# Patient Record
Sex: Female | Born: 1970 | Race: Black or African American | Hispanic: No | Marital: Single | Smoking: Never smoker
Health system: Southern US, Community
[De-identification: ages and names within clinical notes are randomized; demographics above are authoritative.]

## PROBLEM LIST (undated history)

## (undated) DIAGNOSIS — K219 Gastro-esophageal reflux disease without esophagitis: Secondary | ICD-10-CM

## (undated) DIAGNOSIS — I341 Nonrheumatic mitral (valve) prolapse: Secondary | ICD-10-CM

## (undated) DIAGNOSIS — G4730 Sleep apnea, unspecified: Secondary | ICD-10-CM

## (undated) DIAGNOSIS — E538 Deficiency of other specified B group vitamins: Secondary | ICD-10-CM

## (undated) DIAGNOSIS — L309 Dermatitis, unspecified: Secondary | ICD-10-CM

## (undated) DIAGNOSIS — I493 Ventricular premature depolarization: Secondary | ICD-10-CM

## (undated) DIAGNOSIS — B009 Herpesviral infection, unspecified: Secondary | ICD-10-CM

## (undated) DIAGNOSIS — D649 Anemia, unspecified: Secondary | ICD-10-CM

## (undated) DIAGNOSIS — M1990 Unspecified osteoarthritis, unspecified site: Secondary | ICD-10-CM

## (undated) DIAGNOSIS — R2689 Other abnormalities of gait and mobility: Secondary | ICD-10-CM

## (undated) DIAGNOSIS — I10 Essential (primary) hypertension: Secondary | ICD-10-CM

## (undated) DIAGNOSIS — N950 Postmenopausal bleeding: Secondary | ICD-10-CM

## (undated) DIAGNOSIS — R7303 Prediabetes: Secondary | ICD-10-CM

## (undated) DIAGNOSIS — Z973 Presence of spectacles and contact lenses: Secondary | ICD-10-CM

## (undated) DIAGNOSIS — N840 Polyp of corpus uteri: Secondary | ICD-10-CM

## (undated) HISTORY — DX: Other abnormalities of gait and mobility: R26.89

## (undated) HISTORY — DX: Herpesviral infection, unspecified: B00.9

## (undated) HISTORY — PX: OTHER SURGICAL HISTORY: SHX169

## (undated) HISTORY — DX: Sleep apnea, unspecified: G47.30

## (undated) HISTORY — DX: Nonrheumatic mitral (valve) prolapse: I34.1

## (undated) HISTORY — DX: Dermatitis, unspecified: L30.9

## (undated) HISTORY — DX: Gastro-esophageal reflux disease without esophagitis: K21.9

## (undated) HISTORY — DX: Ventricular premature depolarization: I49.3

## (undated) HISTORY — DX: Anemia, unspecified: D64.9

## (undated) HISTORY — PX: NO PAST SURGERIES: SHX2092

---

## 2001-08-29 ENCOUNTER — Emergency Department (HOSPITAL_COMMUNITY): Admission: EM | Admit: 2001-08-29 | Discharge: 2001-08-29 | Payer: Self-pay | Admitting: Emergency Medicine

## 2001-08-29 ENCOUNTER — Encounter: Payer: Self-pay | Admitting: *Deleted

## 2001-08-29 ENCOUNTER — Ambulatory Visit (HOSPITAL_COMMUNITY): Admission: RE | Admit: 2001-08-29 | Discharge: 2001-08-29 | Payer: Self-pay | Admitting: *Deleted

## 2001-11-08 ENCOUNTER — Ambulatory Visit (HOSPITAL_COMMUNITY): Admission: RE | Admit: 2001-11-08 | Discharge: 2001-11-08 | Payer: Self-pay | Admitting: Family Medicine

## 2003-08-01 ENCOUNTER — Emergency Department (HOSPITAL_COMMUNITY): Admission: EM | Admit: 2003-08-01 | Discharge: 2003-08-02 | Payer: Self-pay | Admitting: Emergency Medicine

## 2003-08-02 ENCOUNTER — Encounter: Payer: Self-pay | Admitting: Emergency Medicine

## 2007-05-02 ENCOUNTER — Other Ambulatory Visit: Admission: RE | Admit: 2007-05-02 | Discharge: 2007-05-02 | Payer: Self-pay | Admitting: Obstetrics and Gynecology

## 2008-05-05 ENCOUNTER — Other Ambulatory Visit: Admission: RE | Admit: 2008-05-05 | Discharge: 2008-05-05 | Payer: Self-pay | Admitting: Obstetrics and Gynecology

## 2013-06-04 ENCOUNTER — Encounter: Payer: Self-pay | Admitting: Certified Nurse Midwife

## 2013-06-05 ENCOUNTER — Encounter: Payer: Self-pay | Admitting: Certified Nurse Midwife

## 2013-06-05 ENCOUNTER — Ambulatory Visit: Payer: Self-pay | Admitting: Certified Nurse Midwife

## 2013-06-05 ENCOUNTER — Ambulatory Visit (INDEPENDENT_AMBULATORY_CARE_PROVIDER_SITE_OTHER): Payer: PRIVATE HEALTH INSURANCE | Admitting: Certified Nurse Midwife

## 2013-06-05 VITALS — BP 118/72 | HR 64 | Resp 16 | Ht 65.25 in | Wt 236.0 lb

## 2013-06-05 DIAGNOSIS — Z309 Encounter for contraceptive management, unspecified: Secondary | ICD-10-CM

## 2013-06-05 DIAGNOSIS — Z0000 Encounter for general adult medical examination without abnormal findings: Secondary | ICD-10-CM

## 2013-06-05 DIAGNOSIS — Z23 Encounter for immunization: Secondary | ICD-10-CM

## 2013-06-05 DIAGNOSIS — IMO0001 Reserved for inherently not codable concepts without codable children: Secondary | ICD-10-CM

## 2013-06-05 DIAGNOSIS — Z01419 Encounter for gynecological examination (general) (routine) without abnormal findings: Secondary | ICD-10-CM

## 2013-06-05 LAB — POCT URINALYSIS DIPSTICK
Bilirubin, UA: NEGATIVE
Glucose, UA: NEGATIVE
Ketones, UA: NEGATIVE
Leukocytes, UA: NEGATIVE
Nitrite, UA: NEGATIVE
Protein, UA: NEGATIVE
Urobilinogen, UA: NEGATIVE
pH, UA: 5

## 2013-06-05 LAB — CBC
HCT: 38.9 % (ref 36.0–46.0)
Hemoglobin: 13.1 g/dL (ref 12.0–15.0)
MCH: 29.8 pg (ref 26.0–34.0)
MCHC: 33.7 g/dL (ref 30.0–36.0)
MCV: 88.4 fL (ref 78.0–100.0)
Platelets: 347 10*3/uL (ref 150–400)
RBC: 4.4 MIL/uL (ref 3.87–5.11)
RDW: 14 % (ref 11.5–15.5)
WBC: 5.5 10*3/uL (ref 4.0–10.5)

## 2013-06-05 LAB — TSH: TSH: 1.316 u[IU]/mL (ref 0.350–4.500)

## 2013-06-05 MED ORDER — LEVONORGESTREL-ETHINYL ESTRAD 0.1-20 MG-MCG PO TABS: 1.0000 | ORAL_TABLET | Freq: Every day | ORAL | Status: DC

## 2013-06-05 NOTE — Progress Notes (Signed)
42 y.o. G0P0000 Single African American Fe here for annual exam. Periods normal without problems. OCP working well for cycle control.  Desires continuance. Not sexually active, no STD screening needed. No health issues. Fasted for labs today. Still caring for mother(on dialysis) and sister who has health problems. Sees PCP prn  Patient's last menstrual period was 06/04/2013.          Sexually active: no  The current method of family planning is OCP (estrogen/progesterone).    Exercising: yes  walking Smoker:  no  Health Maintenance: Pap:  06-04-12 neg HPV HR neg MMG:  09-18-12 normal Colonoscopy:  none BMD:   none TDaP:  2003 Labs: Poct urine-rbc3+ on menses Self breast exam: done monthly   reports that she has never smoked. She does not have any smokeless tobacco history on file. She reports that she does not drink alcohol or use illicit drugs.  Past Medical History  Diagnosis Date  . GERD (gastroesophageal reflux disease)   . Anemia   . MVP (mitral valve prolapse)     History reviewed. No pertinent past surgical history.  Current Outpatient Prescriptions  Medication Sig Dispense Refill  . Cholecalciferol (VITAMIN D PO) Take 50,000 Int'l Units by mouth once a week.       Marland Kitchen levonorgestrel-ethinyl estradiol (AVIANE,ALESSE,LESSINA) 0.1-20 MG-MCG tablet Take 1 tablet by mouth daily.      . Multiple Vitamins-Minerals (MULTIVITAMIN PO) Take by mouth daily.       . Omega-3 Fatty Acids (FISH OIL PO) Take 1,000 mg by mouth. 2 daily      . Pantoprazole Sodium (PROTONIX PO) Take 20 mg by mouth daily.        No current facility-administered medications for this visit.    Family History  Problem Relation Age of Onset  . Hypertension Mother   . Diabetes Mother   . Heart disease Father   . Hypertension Father   . Hypertension Sister     ROS:  Pertinent items are noted in HPI.  Otherwise, a comprehensive ROS was negative.  Exam:   BP 118/72  Pulse 64  Resp 16  Ht 5' 5.25"  (1.657 m)  Wt 236 lb (107.049 kg)  BMI 38.99 kg/m2  LMP 06/04/2013 Height: 5' 5.25" (165.7 cm)  Ht Readings from Last 3 Encounters:  06/05/13 5' 5.25" (1.657 m)    General appearance: alert, cooperative and appears stated age Head: Normocephalic, without obvious abnormality, atraumatic Neck: no adenopathy, supple, symmetrical, trachea midline and thyroid normal to inspection and palpation Lungs: clear to auscultation bilaterally Breasts: normal appearance, no masses or tenderness, No nipple retraction or dimpling, No nipple discharge or bleeding, No axillary or supraclavicular adenopathy Heart: regular rate and rhythm Abdomen: soft, non-tender; no masses,  no organomegaly Extremities: extremities normal, atraumatic, no cyanosis or edema Skin: Skin color, texture, turgor normal. No rashes or lesions Lymph nodes: Cervical, supraclavicular, and axillary nodes normal. No abnormal inguinal nodes palpated Neurologic: Grossly normal   Pelvic: External genitalia:  no lesions              Urethra:  normal appearing urethra with no masses, tenderness or lesions              Bartholin's and Skene's: normal                 Vagina: normal appearing vagina with normal color and discharge, no lesions, blood present              Cervix: normal,  non tender blood noted from cervix              Pap taken: no Bimanual Exam:  Uterus:  normal size, contour, position, consistency, mobility, non-tender and mid position              Adnexa: normal adnexa and no mass, fullness, tenderness               Rectovaginal: Confirms               Anus:  normal sphincter tone, no lesions  A:  Well Woman with normal exam  Contraception cycle control only, history of menorrhagia OCP working well  Immunization update  Social stress with family care  P:   Reviewed health and wellness pertinent to exam  Rx Aviane see order  Discussed risks and benefits of TDAP, requests  Encouraged to take time for self, seek  family and friends support  Pap smear as per guidelines  Mammogram Yearly pap smear not taken today  counseled on breast self exam, mammography screening, adequate intake of calcium and vitamin D, diet and exercise and weight control. Encouraged to work on diet and decrease fast food.  return annually or prn  An After Visit Summary was printed and given to the patient.

## 2013-06-05 NOTE — Progress Notes (Signed)
Note reviewed, agree with plan.  Aspyn Warnke, MD  

## 2013-06-05 NOTE — Patient Instructions (Addendum)

## 2013-06-06 ENCOUNTER — Telehealth: Payer: Self-pay

## 2013-06-06 LAB — LIPID PANEL
Cholesterol: 228 mg/dL — ABNORMAL HIGH (ref 0–200)
HDL: 73 mg/dL (ref >39)
LDL Cholesterol: 145 mg/dL — ABNORMAL HIGH (ref 0–99)
Total CHOL/HDL Ratio: 3.1 Ratio
Triglycerides: 52 mg/dL (ref <150)
VLDL: 10 mg/dL (ref 0–40)

## 2013-06-06 LAB — VITAMIN D 25 HYDROXY (VIT D DEFICIENCY, FRACTURES): Vit D, 25-Hydroxy: 35 ng/mL (ref 30–89)

## 2013-06-06 LAB — HEMOGLOBIN A1C
Hgb A1c MFr Bld: 5.4 % (ref <5.7)
Mean Plasma Glucose: 108 mg/dL (ref <117)

## 2013-06-06 NOTE — Telephone Encounter (Signed)
Message copied by Eliezer Bottom on Fri Jun 06, 2013  3:21 PM ------      Message from: Verner Chol      Created: Fri Jun 06, 2013 12:56 PM       Notify CBC, TSH,Hgb A 1-c, and Vitamin normal      Lipid panel borderline high Cholesterol, but HDL(good cholesterol) great, LDL cholesterol also borderline,      Work on decrease cholesterol foods, exercise and maintain  Normal weight, add fish to diet to help      Recheck at [facility]. ------

## 2013-06-06 NOTE — Telephone Encounter (Signed)
[email or web address]

## 2013-06-06 NOTE — Telephone Encounter (Signed)
Patient notified of results.

## 2013-06-30 ENCOUNTER — Other Ambulatory Visit: Payer: Self-pay | Admitting: Nurse Practitioner

## 2013-10-06 ENCOUNTER — Ambulatory Visit (INDEPENDENT_AMBULATORY_CARE_PROVIDER_SITE_OTHER): Payer: PRIVATE HEALTH INSURANCE | Admitting: Family Medicine

## 2013-10-06 ENCOUNTER — Encounter (INDEPENDENT_AMBULATORY_CARE_PROVIDER_SITE_OTHER): Payer: Self-pay

## 2013-10-06 ENCOUNTER — Encounter: Payer: Self-pay | Admitting: *Deleted

## 2013-10-06 VITALS — BP 125/73 | HR 85 | Temp 99.7°F | Ht 65.25 in | Wt 244.0 lb

## 2013-10-06 DIAGNOSIS — J069 Acute upper respiratory infection, unspecified: Secondary | ICD-10-CM

## 2013-10-06 DIAGNOSIS — J309 Allergic rhinitis, unspecified: Secondary | ICD-10-CM

## 2013-10-06 MED ORDER — AZITHROMYCIN 250 MG PO TABS: ORAL_TABLET | ORAL | Status: DC

## 2013-10-06 MED ORDER — FLUTICASONE PROPIONATE 50 MCG/ACT NA SUSP: 2.0000 | Freq: Every day | NASAL | Status: DC

## 2013-10-06 NOTE — Progress Notes (Signed)
   Subjective:    Patient ID: Renee Thomas, female    DOB: 06-28-1971, 42 y.o.   MRN: [id number]  HPI URI Symptoms Onset: 2-3 days  Description: rhinorrhea, nasal congestion, cough, some sneezing  Modifying factors:  Had similar sxs 2-3 weeks ago that resolved   Symptoms Nasal discharge: yes Fever: no Sore throat: yes Cough: es Wheezing: no Ear pain: no GI symptoms: no Sick contacts: no  Red Flags  Stiff neck: no Dyspnea: n Rash: no Swallowing difficulty: no  Sinusitis Risk Factors Headache/face pain: no Double sickening: no tooth pain: no  Allergy Risk Factors Sneezing: yes Itchy scratchy throat: mild Seasonal symptoms: yes  Flu Risk Factors Headache: no muscle aches: no severe fatigue: no     Review of Systems  All other systems reviewed and are negative.       Objective:   Physical Exam  Constitutional: She is oriented to person, place, and time. She appears well-developed and well-nourished.  HENT:  Head: Normocephalic and atraumatic.  Right Ear: External ear normal.  Left Ear: External ear normal.  +nasal erythema, rhinorrhea bilaterally, + post oropharyngeal erythema    Eyes: Conjunctivae are normal. Pupils are equal, round, and reactive to light.  Neck: Normal range of motion. Neck supple.  Cardiovascular: Normal rate and regular rhythm.   Pulmonary/Chest: Effort normal and breath sounds normal.  Abdominal: Soft. Bowel sounds are normal.  Musculoskeletal: Normal range of motion.  Neurological: She is alert and oriented to person, place, and time.  Skin: Skin is warm.          Assessment & Plan:  URI (upper respiratory infection) - Plan: azithromycin (ZITHROMAX) 250 MG tablet  Allergic rhinitis - Plan: fluticasone (FLONASE) 50 MCG/ACT nasal spray  Suspect overlapping viral and allergic etiologies of sxs  Zpak for atypical coverage given duration of sxs  Continue oral antihistamine  Start on flonase Discussed supportive care and  infectious/ENT red flags Follow up as needed.

## 2014-03-18 DIAGNOSIS — Z0289 Encounter for other administrative examinations: Secondary | ICD-10-CM

## 2014-03-23 ENCOUNTER — Telehealth: Payer: Self-pay | Admitting: Nurse Practitioner

## 2014-03-25 NOTE — Telephone Encounter (Signed)
Renee Thomas completed forms and faxed, pt aware

## 2014-06-12 ENCOUNTER — Ambulatory Visit: Payer: PRIVATE HEALTH INSURANCE | Admitting: Certified Nurse Midwife

## 2014-06-29 ENCOUNTER — Other Ambulatory Visit: Payer: Self-pay | Admitting: Certified Nurse Midwife

## 2014-06-29 NOTE — Telephone Encounter (Signed)
Last AEX: 06/05/13 Last refill:06/05/13 #1, 12 ref Current AEX:07/01/14  Sent 1 refill until pt AEX

## 2014-07-01 ENCOUNTER — Encounter: Payer: Self-pay | Admitting: Certified Nurse Midwife

## 2014-07-01 ENCOUNTER — Ambulatory Visit (INDEPENDENT_AMBULATORY_CARE_PROVIDER_SITE_OTHER): Payer: 59 | Admitting: Certified Nurse Midwife

## 2014-07-01 VITALS — BP 110/70 | HR 72 | Resp 16 | Ht 65.25 in | Wt 244.0 lb

## 2014-07-01 DIAGNOSIS — Z309 Encounter for contraceptive management, unspecified: Secondary | ICD-10-CM

## 2014-07-01 DIAGNOSIS — L723 Sebaceous cyst: Secondary | ICD-10-CM

## 2014-07-01 DIAGNOSIS — Z01419 Encounter for gynecological examination (general) (routine) without abnormal findings: Secondary | ICD-10-CM

## 2014-07-01 DIAGNOSIS — Z0000 Encounter for general adult medical examination without abnormal findings: Secondary | ICD-10-CM

## 2014-07-01 DIAGNOSIS — Z124 Encounter for screening for malignant neoplasm of cervix: Secondary | ICD-10-CM

## 2014-07-01 LAB — POCT URINALYSIS DIPSTICK
Bilirubin, UA: NEGATIVE
Blood, UA: NEGATIVE
Glucose, UA: NEGATIVE
Ketones, UA: NEGATIVE
Leukocytes, UA: NEGATIVE
Nitrite, UA: NEGATIVE
Protein, UA: NEGATIVE
Urobilinogen, UA: NEGATIVE
pH, UA: 5

## 2014-07-01 LAB — LIPID PANEL
Cholesterol: 204 mg/dL — ABNORMAL HIGH (ref 0–200)
HDL: 68 mg/dL (ref >39)
LDL Cholesterol: 125 mg/dL — ABNORMAL HIGH (ref 0–99)
Total CHOL/HDL Ratio: 3 Ratio
Triglycerides: 55 mg/dL (ref <150)
VLDL: 11 mg/dL (ref 0–40)

## 2014-07-01 LAB — TSH: TSH: 0.845 u[IU]/mL (ref 0.350–4.500)

## 2014-07-01 LAB — HEMOGLOBIN, FINGERSTICK: Hemoglobin, fingerstick: 12.7 g/dL (ref 12.0–16.0)

## 2014-07-01 MED ORDER — DOXYCYCLINE HYCLATE 100 MG PO CAPS: 100.0000 mg | ORAL_CAPSULE | Freq: Two times a day (BID) | ORAL | Status: DC

## 2014-07-01 MED ORDER — LEVONORGESTREL-ETHINYL ESTRAD 0.1-20 MG-MCG PO TABS: ORAL_TABLET | ORAL | Status: DC

## 2014-07-01 NOTE — Patient Instructions (Signed)
Exercise to Lose Weight Exercise and a healthy diet may help you lose weight. Your doctor may suggest specific exercises. EXERCISE IDEAS AND TIPS  Choose low-cost things you enjoy doing, such as walking, bicycling, or exercising to workout videos.  Take stairs instead of the elevator.  Walk during your lunch break.  Park your car further away from work or school.  Go to a gym or an exercise class.  Start with 5 to 10 minutes of exercise each day. Build up to 30 minutes of exercise 4 to 6 days a week.  Wear shoes with good support and comfortable clothes.  Stretch before and after working out.  Work out until you breathe harder and your heart beats faster.  Drink extra water when you exercise.  Do not do so much that you hurt yourself, feel dizzy, or get very short of breath. Exercises that burn about 150 calories:  Running 1  miles in 15 minutes.  Playing volleyball for 45 to 60 minutes.  Washing and waxing a car for 45 to 60 minutes.  Playing touch football for 45 minutes.  Walking 1  miles in 35 minutes.  Pushing a stroller 1  miles in 30 minutes.  Playing basketball for 30 minutes.  Raking leaves for 30 minutes.  Bicycling 5 miles in 30 minutes.  Walking 2 miles in 30 minutes.  Dancing for 30 minutes.  Shoveling snow for 15 minutes.  Swimming laps for 20 minutes.  Walking up stairs for 15 minutes.  Bicycling 4 miles in 15 minutes.  Gardening for 30 to 45 minutes.  Jumping rope for 15 minutes.  Washing windows or floors for 45 to 60 minutes. Document Released: 11/25/2010 Document Revised: 01/15/2012 Document Reviewed: 11/25/2010 Exit[employer] Patient Information 2015 [employer]. This information is not intended to replace advice given to you by your health care provider. Make sure you discuss any questions you have with your health care provider.  

## 2014-07-01 NOTE — Progress Notes (Signed)
44 y.o. G0P0000 Single African American Fe here for annual exam.  Periods normal no issues. Contraception working well. No partnerchange, no STD screening desired. Sees PCP prn. Labs here today. Patient noted sebaceous cysts again on labia, treated for in 2011, were cultured for HSV and MRSA both negative. No other health concerns today.  Patient's last menstrual period was 06/03/2014.          Sexually active: Yes.    The current method of family planning is OCP (estrogen/progesterone).    Exercising: No.  exercise Smoker:  no  Health Maintenance: Pap: 06-04-12 neg HPV HR neg MMG:  10-01-13 composition category a, birads category 1:neg Colonoscopy:  none BMD:   none TDaP:  2014 Labs: Poct urine-neg, Hgb-12.7  Self breast exam: done monthly   reports that she has never smoked. She does not have any smokeless tobacco history on file. She reports that she does not drink alcohol or use illicit drugs.  Past Medical History  Diagnosis Date  . GERD (gastroesophageal reflux disease)   . Anemia   . MVP (mitral valve prolapse)     History reviewed. No pertinent past surgical history.  Current Outpatient Prescriptions  Medication Sig Dispense Refill  . Cholecalciferol (VITAMIN D PO) Take by mouth daily.      . Multiple Vitamins-Minerals (MULTIVITAMIN PO) Take by mouth daily.       . Omega-3 Fatty Acids (FISH OIL PO) Take 1,000 mg by mouth. 2 daily      . omeprazole (PRILOSEC) 20 MG capsule Take 20 mg by mouth daily.      . SRONYX 0.1-20 MG-MCG tablet TAKE 1 TABLET BY MOUTH DAILY.  28 tablet  0   No current facility-administered medications for this visit.    Family History  Problem Relation Age of Onset  . Hypertension Mother   . Diabetes Mother   . Other Mother     renal disease  . Hypertension Father   . Heart attack Father   . Hypertension Sister   . Diabetes Brother   . Diabetes Brother     ROS:  Pertinent items are noted in HPI.  Otherwise, a comprehensive ROS was  negative.  Exam:   BP 110/70  Pulse 72  Resp 16  Ht 5' 5.25" (1.657 m)  Wt 244 lb (110.678 kg)  BMI 40.31 kg/m2  LMP 06/03/2014 Height: 5' 5.25" (165.7 cm)  Ht Readings from Last 3 Encounters:  07/01/14 5' 5.25" (1.657 m)  10/06/13 5' 5.25" (1.657 m)  06/05/13 5' 5.25" (1.657 m)    General appearance: alert, cooperative and appears stated age Head: Normocephalic, without obvious abnormality, atraumatic Neck: no adenopathy, supple, symmetrical, trachea midline and thyroid normal to inspection and palpation Lungs: clear to auscultation bilaterally Breasts: normal appearance, no masses or tenderness, No nipple retraction or dimpling, No nipple discharge or bleeding, No axillary or supraclavicular adenopathy Heart: regular rate and rhythm Abdomen: soft, non-tender; no masses,  no organomegaly Extremities: extremities normal, atraumatic, no cyanosis or edema Skin: Skin color, texture, turgor normal. No rashes or lesions Lymph nodes: Cervical, supraclavicular, and axillary nodes normal. No abnormal inguinal nodes palpated Neurologic: Grossly normal   Pelvic: External genitalia: sebaceous noted on clitoral area and on left labia. Tender to touch, do not appear to be HSV. Patient declines culture, due to same as before.              Urethra:  normal appearing urethra with no masses, tenderness or lesions  Bartholin's and Skene's: normal                 Vagina: normal appearing vagina with normal color and discharge, no lesions              Cervix: normal , non tender, no lesions              Pap taken: Yes.   Bimanual Exam:  Uterus:  normal size, contour, position, consistency, mobility, non-tender and retroverted              Adnexa: normal adnexa and no mass, fullness, tenderness               Rectovaginal: Confirms               Anus:  normal sphincter tone, no lesions  A:  Well Woman with normal exam  Contraception OCP desired  Sebaceous cysts in external genital  area  Obese  Screening labs  P:   Reviewed health and wellness pertinent to exam.  Rx Sronyx see order  Discussed findings and agree treatment indicated  Rx Doxycycline see order  Discussed weight loss and portion control, she and sister to work on.  Pap smear taken today with HPV reflex  Labs: Lipid panel, TSH Vitamin D   counseled on breast self exam, mammography screening, STD prevention, HIV risk factors and prevention, use and side effects of OCP's, adequate intake of calcium and vitamin D, diet and exercise  return annually or prn  An After Visit Summary was printed and given to the patient.

## 2014-07-02 ENCOUNTER — Telehealth: Payer: Self-pay

## 2014-07-02 ENCOUNTER — Other Ambulatory Visit: Payer: Self-pay

## 2014-07-02 LAB — VITAMIN D 25 HYDROXY (VIT D DEFICIENCY, FRACTURES): Vit D, 25-Hydroxy: 24 ng/mL — ABNORMAL LOW (ref 30–89)

## 2014-07-02 LAB — IPS PAP TEST WITH REFLEX TO HPV

## 2014-07-02 MED ORDER — VITAMIN D (ERGOCALCIFEROL) 1.25 MG (50000 UNIT) PO CAPS: 50000.0000 [IU] | ORAL_CAPSULE | ORAL | Status: DC

## 2014-07-02 NOTE — Telephone Encounter (Signed)
Patient notified of results. See lab 

## 2014-07-02 NOTE — Telephone Encounter (Signed)
Lab results for vitamin d calls for vitamin d protocol. Vitamin d 50,000iu once weekly for then switch to vit d 800iu daily for then recheck level. Pt notified. rx sent in

## 2014-07-02 NOTE — Telephone Encounter (Signed)
Message copied by Eliezer Bottom on Thu Jul 02, 2014  9:49 AM ------      Message from: Ria Comment R      Created: Thu Jul 02, 2014  8:54 AM       Results via my chart:  Please call patient for Vit D protocol and follow up.  She may need some advise about a low cholesterol diet as well.:            Ketsia, your labs show a low Vit D level.  Joy will call you with the next step for treatment of this.  The Thyroid is normal.  The lipid panel shows a slight elevated total cholesterol and LDL (bad cholesterol).  Now is the time to start on a low cholesterol diet and treat this before it gets any higher.      Ulice Bold FNP covering for Ortencia Kick ------

## 2014-07-02 NOTE — Telephone Encounter (Signed)
[email or web address]

## 2014-07-02 NOTE — Telephone Encounter (Signed)
Return call to Joy. °

## 2014-07-03 NOTE — Progress Notes (Signed)
Reviewed personally.  M. Suzanne Masud Holub, MD.  

## 2014-08-22 ENCOUNTER — Other Ambulatory Visit: Payer: Self-pay | Admitting: Certified Nurse Midwife

## 2014-08-24 NOTE — Telephone Encounter (Signed)
Last refilled: 07/02/14 #8/0 refills   According to 07/01/14 Vitamin D lab result Patient was to take Vitamin D rx 1/wk for 2 months then was to take otc Vitamin D for another 2 months then was supposed to have recheck.  Denied rx through our system.

## 2014-09-17 ENCOUNTER — Telehealth: Payer: Self-pay | Admitting: Family Medicine

## 2014-09-17 NOTE — Telephone Encounter (Signed)
Appt given for tomorrow per patient request 

## 2014-09-18 ENCOUNTER — Ambulatory Visit (INDEPENDENT_AMBULATORY_CARE_PROVIDER_SITE_OTHER): Payer: 59 | Admitting: Family Medicine

## 2014-09-18 ENCOUNTER — Encounter: Payer: Self-pay | Admitting: Family Medicine

## 2014-09-18 VITALS — BP 122/74 | HR 73 | Temp 97.5°F | Ht 65.25 in | Wt 250.0 lb

## 2014-09-18 DIAGNOSIS — J310 Chronic rhinitis: Secondary | ICD-10-CM

## 2014-09-18 DIAGNOSIS — H7092 Unspecified mastoiditis, left ear: Secondary | ICD-10-CM

## 2014-09-18 DIAGNOSIS — J329 Chronic sinusitis, unspecified: Secondary | ICD-10-CM

## 2014-09-18 DIAGNOSIS — L723 Sebaceous cyst: Secondary | ICD-10-CM

## 2014-09-18 MED ORDER — OMEPRAZOLE 20 MG PO CPDR: 20.0000 mg | DELAYED_RELEASE_CAPSULE | Freq: Every day | ORAL | Status: DC

## 2014-09-18 MED ORDER — DOXYCYCLINE HYCLATE 100 MG PO CAPS: 100.0000 mg | ORAL_CAPSULE | Freq: Two times a day (BID) | ORAL | Status: DC

## 2014-09-18 MED ORDER — FLUTICASONE PROPIONATE 50 MCG/ACT NA SUSP: NASAL | Status: DC

## 2014-09-18 NOTE — Progress Notes (Signed)
Subjective:    Patient ID: Renee Thomas, female    DOB: 01/10/1971, 43 y.o.   MRN: [id number]  HPI Patient here today for knot / lump behind left ear. This was first noticed after a dental procedure 2 weekd ago. The tenderness is a spur seen noted over the left mastoid area       There are no active problems to display for this patient.  Outpatient Encounter Prescriptions as of 09/18/2014  Medication Sig  . Cholecalciferol (VITAMIN D PO) Take by mouth daily.  Marland Kitchen. levonorgestrel-ethinyl estradiol (SRONYX) 0.1-20 MG-MCG tablet TAKE 1 TABLET BY MOUTH DAILY.  . Multiple Vitamins-Minerals (MULTIVITAMIN PO) Take by mouth daily.   . Omega-3 Fatty Acids (FISH OIL PO) Take 1,000 mg by mouth. 2 daily  . omeprazole (PRILOSEC) 20 MG capsule Take 20 mg by mouth daily.  . [DISCONTINUED] doxycycline (VIBRAMYCIN) 100 MG capsule Take 1 capsule (100 mg total) by mouth 2 (two) times daily. Take BID for 14 days.  Take with food as can cause GI distress.  . [DISCONTINUED] Vitamin D, Ergocalciferol, (DRISDOL) 50000 UNITS CAPS capsule Take 1 capsule (50,000 Units total) by mouth every 7 (seven) days.    Review of Systems  Constitutional: Negative.   HENT: Positive for ear pain (behind left ear - knot).   Eyes: Negative.   Respiratory: Negative.   Cardiovascular: Negative.   Gastrointestinal: Negative.   Endocrine: Negative.   Genitourinary: Negative.   Musculoskeletal: Negative.   Skin: Negative.   Allergic/Immunologic: Negative.   Neurological: Negative.   Hematological: Negative.   Psychiatric/Behavioral: Negative.        Objective:   Physical Exam  Constitutional: She is oriented to person, place, and time. She appears well-developed and well-nourished. No distress.  HENT:  Head: Normocephalic and atraumatic.  Right Ear: External ear normal.  Left Ear: External ear normal.  Mouth/Throat: Oropharynx is clear and moist. No oropharyngeal exudate.  Some nasal congestion bilaterally and  the left mastoid may be slightly more tender and slightly bigger than the right. There is also frontal and ethmoid sinus tenderness.  Eyes: Conjunctivae and EOM are normal. Pupils are equal, round, and reactive to light. Right eye exhibits no discharge. Left eye exhibits no discharge. No scleral icterus.  Neck: Normal range of motion. Neck supple. No thyromegaly present.  No anterior cervical nodes  Cardiovascular: Normal rate, regular rhythm and normal heart sounds.   No murmur heard. Pulmonary/Chest: Effort normal and breath sounds normal. No respiratory distress. She has no wheezes. She has no rales. She exhibits no tenderness.  Abdominal: Bowel sounds are normal.  Musculoskeletal: Normal range of motion.  Lymphadenopathy:    She has no cervical adenopathy.  Neurological: She is alert and oriented to person, place, and time.  Skin: Skin is warm and dry. No rash noted. No erythema.  Psychiatric: She has a normal mood and affect. Her behavior is normal. Judgment and thought content normal.  Nursing note and vitals reviewed.   BP 122/74 mmHg  Pulse 73  Temp(Src) 97.5 F (36.4 C) (Oral)  Ht 5' 5.25" (1.657 m)  Wt 250 lb (113.399 kg)  BMI 41.30 kg/m2  LMP 08/21/2014       Assessment & Plan:  1. Sebaceous cyst -resolved  2. Rhinosinusitis - fluticasone (FLONASE) 50 MCG/ACT nasal spray; Use 1-2 sprays each nostril at bedtime  Dispense: 16 g; Refill: 6 - doxycycline (VIBRAMYCIN) 100 MG capsule; Take 1 capsule (100 mg total) by mouth 2 (two) times daily. Take  BID for 14 days.  Take with food as can cause GI distress.  Dispense: 28 capsule; Refill: 0  3. Mastoiditis, left - doxycycline (VIBRAMYCIN) 100 MG capsule; Take 1 capsule (100 mg total) by mouth 2 (two) times daily. Take BID for 14 days.  Take with food as can cause GI distress.  Dispense: 28 capsule; Refill: 0  Patient Instructions  Take antibiotic as directed with food Use nose spray regularly at bedtime 1-2 sprays each  nostril Use saline nose spray during the day Use Monistat vaginal cream if any problems with yeast infection secondary to the antibiotic Drink plenty of fluids and keep the house cooler Call in 2 weeks if no better   Nyra Capeson W. Laiya Wisby MD

## 2014-09-18 NOTE — Patient Instructions (Signed)
Take antibiotic as directed with food Use nose spray regularly at bedtime 1-2 sprays each nostril Use saline nose spray during the day Use Monistat vaginal cream if any problems with yeast infection secondary to the antibiotic Drink plenty of fluids and keep the house cooler Call in 2 weeks if no better

## 2014-11-04 ENCOUNTER — Other Ambulatory Visit (INDEPENDENT_AMBULATORY_CARE_PROVIDER_SITE_OTHER): Payer: 59

## 2014-11-04 DIAGNOSIS — Z0000 Encounter for general adult medical examination without abnormal findings: Secondary | ICD-10-CM

## 2014-11-04 DIAGNOSIS — E559 Vitamin D deficiency, unspecified: Secondary | ICD-10-CM

## 2014-11-05 LAB — VITAMIN D 25 HYDROXY (VIT D DEFICIENCY, FRACTURES): Vit D, 25-Hydroxy: 32 ng/mL (ref 30–100)

## 2015-03-04 ENCOUNTER — Encounter: Payer: Self-pay | Admitting: Nurse Practitioner

## 2015-03-04 ENCOUNTER — Ambulatory Visit (INDEPENDENT_AMBULATORY_CARE_PROVIDER_SITE_OTHER): Payer: 59 | Admitting: Nurse Practitioner

## 2015-03-04 VITALS — BP 120/79 | HR 69 | Temp 99.1°F | Ht 65.25 in | Wt 252.8 lb

## 2015-03-04 DIAGNOSIS — J302 Other seasonal allergic rhinitis: Secondary | ICD-10-CM

## 2015-03-04 DIAGNOSIS — J310 Chronic rhinitis: Secondary | ICD-10-CM

## 2015-03-04 DIAGNOSIS — H65413 Chronic allergic otitis media, bilateral: Secondary | ICD-10-CM

## 2015-03-04 DIAGNOSIS — J329 Chronic sinusitis, unspecified: Secondary | ICD-10-CM

## 2015-03-04 MED ORDER — FLUTICASONE PROPIONATE 50 MCG/ACT NA SUSP: NASAL | Status: DC

## 2015-03-04 NOTE — Patient Instructions (Signed)

## 2015-03-04 NOTE — Progress Notes (Signed)
   Subjective:    Patient ID: Lianne MorisRhonda F Ortloff, female    DOB: 11/12/1970, 44 y.o.   MRN: [id number]  HPI Patient in today c/o of left ear fullness. SOme ringing in ears with occasional pain lasting several seconds.    Review of Systems  Constitutional: Negative for fever, chills and fatigue.  HENT: Positive for congestion, postnasal drip and sneezing.        Scratchy throat  Eyes: Negative.   Respiratory: Negative.   Cardiovascular: Negative.   Gastrointestinal: Negative.   Genitourinary: Negative.   Neurological: Negative.   Psychiatric/Behavioral: Negative.   All other systems reviewed and are negative.      Objective:   Physical Exam  Constitutional: She is oriented to person, place, and time. She appears well-developed and well-nourished. No distress.  HENT:  Right Ear: Hearing, external ear and ear canal normal. A middle ear effusion (clear) is present.  Left Ear: Hearing, external ear and ear canal normal. A middle ear effusion (clear) is present.  Nose: Mucosal edema and rhinorrhea present. Right sinus exhibits no maxillary sinus tenderness and no frontal sinus tenderness. Left sinus exhibits no maxillary sinus tenderness and no frontal sinus tenderness.  Mouth/Throat: Uvula is midline, oropharynx is clear and moist and mucous membranes are normal.  Eyes: Pupils are equal, round, and reactive to light.  Neck: Normal range of motion. Neck supple.  Cardiovascular: Normal rate, regular rhythm and normal heart sounds.   Pulmonary/Chest: Effort normal and breath sounds normal.  Neurological: She is alert and oriented to person, place, and time.  Skin: Skin is warm and dry.  Psychiatric: She has a normal mood and affect. Her behavior is normal. Judgment and thought content normal.   BP 120/79 mmHg  Pulse 69  Temp(Src) 99.1 F (37.3 C) (Oral)  Ht 5' 5.25" (1.657 m)  Wt 252 lb 12.8 oz (114.669 kg)  BMI 41.76 kg/m2        Assessment & Plan:   1. Chronic allergic otitis  media of both ears   2. Other seasonal allergic rhinitis   3. Rhinosinusitis    Meds ordered this encounter  Medications  . fluticasone (FLONASE) 50 MCG/ACT nasal spray    Sig: Use 1-2 sprays each nostril at bedtime    Dispense:  16 g    Refill:  6    Order Specific Question:  Supervising Provider    Answer:  Ernestina PennaMOORE, DONALD W [[id number]]   Force fluids RTO prn  [name] Daphine DeutscherMartin, FNP

## 2015-06-24 ENCOUNTER — Other Ambulatory Visit: Payer: Self-pay | Admitting: Certified Nurse Midwife

## 2015-06-24 NOTE — Telephone Encounter (Signed)
Medication refill request: Sronyc Last AEX:  07-01-14  Next AEX: 07-09-15 Last MMG (if hormonal medication request): 10-23-14 WNL  Refill authorized: please advise

## 2015-07-09 ENCOUNTER — Ambulatory Visit (INDEPENDENT_AMBULATORY_CARE_PROVIDER_SITE_OTHER): Payer: 59 | Admitting: Certified Nurse Midwife

## 2015-07-09 ENCOUNTER — Encounter: Payer: Self-pay | Admitting: Certified Nurse Midwife

## 2015-07-09 VITALS — BP 110/76 | HR 68 | Resp 16 | Ht 65.0 in | Wt 248.0 lb

## 2015-07-09 DIAGNOSIS — Z0000 Encounter for general adult medical examination without abnormal findings: Secondary | ICD-10-CM

## 2015-07-09 DIAGNOSIS — Z01419 Encounter for gynecological examination (general) (routine) without abnormal findings: Secondary | ICD-10-CM | POA: Diagnosis not present

## 2015-07-09 DIAGNOSIS — Z3041 Encounter for surveillance of contraceptive pills: Secondary | ICD-10-CM

## 2015-07-09 LAB — POCT URINALYSIS DIPSTICK
Bilirubin, UA: NEGATIVE
Blood, UA: NEGATIVE
Glucose, UA: NEGATIVE
Ketones, UA: NEGATIVE
Leukocytes, UA: NEGATIVE
Nitrite, UA: NEGATIVE
Protein, UA: NEGATIVE
Urobilinogen, UA: NEGATIVE
pH, UA: 5

## 2015-07-09 LAB — COMPREHENSIVE METABOLIC PANEL
ALT: 13 U/L (ref 6–29)
AST: 14 U/L (ref 10–35)
Albumin: 4.1 g/dL (ref 3.6–5.1)
Alkaline Phosphatase: 70 U/L (ref 33–115)
BUN: 15 mg/dL (ref 7–25)
CO2: 25 mmol/L (ref 20–31)
Calcium: 9.6 mg/dL (ref 8.6–10.2)
Chloride: 102 mmol/L (ref 98–110)
Creat: 0.79 mg/dL (ref 0.50–1.10)
Glucose, Bld: 72 mg/dL (ref 65–99)
Potassium: 4.2 mmol/L (ref 3.5–5.3)
Sodium: 140 mmol/L (ref 135–146)
Total Bilirubin: 0.9 mg/dL (ref 0.2–1.2)
Total Protein: 7.3 g/dL (ref 6.1–8.1)

## 2015-07-09 LAB — LIPID PANEL
Cholesterol: 193 mg/dL (ref 125–200)
HDL: 73 mg/dL (ref >=46)
LDL Cholesterol: 110 mg/dL (ref <130)
Total CHOL/HDL Ratio: 2.6 Ratio (ref <=5.0)
Triglycerides: 49 mg/dL (ref <150)
VLDL: 10 mg/dL (ref <30)

## 2015-07-09 LAB — HEMOGLOBIN A1C
Hgb A1c MFr Bld: 5.5 % (ref <5.7)
Mean Plasma Glucose: 111 mg/dL (ref <117)

## 2015-07-09 LAB — TSH: TSH: 1.068 u[IU]/mL (ref 0.350–4.500)

## 2015-07-09 MED ORDER — LEVONORGESTREL-ETHINYL ESTRAD 0.1-20 MG-MCG PO TABS: 1.0000 | ORAL_TABLET | Freq: Every day | ORAL | Status: DC

## 2015-07-09 NOTE — Progress Notes (Signed)
44 y.o. G0P0000 Single  African American Fe here for annual exam. Periods normal, one missed period in past year only. Contraception working well.  Sees PCP for aex and allergy management. Desires screening labs, did not have at PCP. Not sexually active now, desires STD screening. Working on weight loss again. No other health issues today.  LMP 06/30/15          Sexually active: No.  The current method of family planning is OCP (estrogen/progesterone).    Exercising: No.  exercise Smoker:  no  Health Maintenance: Pap: 07-01-14 neg MMG:  10-23-14 category a density,birads 1:neg Colonoscopy:  none BMD:   none TDaP:  2014 Labs: poct urine-neg, hgb-11.8 Self breast exam: done monthly   reports that she has never smoked. She does not have any smokeless tobacco history on file. She reports that she does not drink alcohol or use illicit drugs.  Past Medical History  Diagnosis Date  . GERD (gastroesophageal reflux disease)   . Anemia   . MVP (mitral valve prolapse)     No past surgical history on file.  Current Outpatient Prescriptions  Medication Sig Dispense Refill  . Cholecalciferol (VITAMIN D PO) Take by mouth daily.    Marland Kitchen doxycycline (VIBRAMYCIN) 100 MG capsule Take 1 capsule (100 mg total) by mouth 2 (two) times daily. Take BID for 14 days.  Take with food as can cause GI distress. (Patient not taking: Reported on 03/04/2015) 28 capsule 0  . fluticasone (FLONASE) 50 MCG/ACT nasal spray Use 1-2 sprays each nostril at bedtime 16 g 6  . Multiple Vitamins-Minerals (MULTIVITAMIN PO) Take by mouth daily.     . Omega-3 Fatty Acids (FISH OIL PO) Take 1,000 mg by mouth. 2 daily    . omeprazole (PRILOSEC) 20 MG capsule Take 1 capsule (20 mg total) by mouth daily. 90 capsule 3  . SRONYX 0.1-20 MG-MCG tablet TAKE 1 TABLET BY MOUTH DAILY. 28 tablet 1   No current facility-administered medications for this visit.    Family History  Problem Relation Age of Onset  . Hypertension Mother   .  Diabetes Mother   . Other Mother     renal disease  . Hypertension Father   . Heart attack Father   . Hypertension Sister   . Diabetes Brother   . Diabetes Brother     ROS:  Pertinent items are noted in HPI.  Otherwise, a comprehensive ROS was negative.  Exam:   There were no vitals taken for this visit.   Ht Readings from Last 3 Encounters:  03/04/15 5' 5.25" (1.657 m)  09/18/14 5' 5.25" (1.657 m)  07/01/14 5' 5.25" (1.657 m)    General appearance: alert, cooperative and appears stated age Head: Normocephalic, without obvious abnormality, atraumatic Neck: no adenopathy, supple, symmetrical, trachea midline and thyroid normal to inspection and palpation Lungs: clear to auscultation bilaterally Breasts: normal appearance, no masses or tenderness, No nipple retraction or dimpling, No nipple discharge or bleeding, No axillary or supraclavicular adenopathy Heart: regular rate and rhythm Abdomen: soft, non-tender; no masses,  no organomegaly Extremities: extremities normal, atraumatic, no cyanosis or edema Skin: Skin color, texture, turgor normal. No rashes or lesions Lymph nodes: Cervical, supraclavicular, and axillary nodes normal. No abnormal inguinal nodes palpated Neurologic: Grossly normal   Pelvic: External genitalia:  no lesions              Urethra:  normal appearing urethra with no masses, tenderness or lesions  Bartholin's and Skene's: normal                 Vagina: normal appearing vagina with normal color and discharge, no lesions              Cervix: normal              Pap taken: No. Bimanual Exam:  Uterus:  normal size, contour, position, consistency, mobility, non-tender              Adnexa: normal adnexa and no mass, fullness, tenderness  limited due to body habitus               Rectovaginal: Confirms               Anus:  normal sphincter tone, no lesions  Chaperone present: yes  A:  Well Woman with normal exam  Contraception OCP  desired  Screening labs  Obese  P:   Reviewed health and wellness pertinent to exam  Rx Spronyx see order  Lab: HIV,RPR,GC,Chlamyidia, HSV 1,2, TSH CMP,Lipid panel Hgb A1-c,Vitamin D  Discussed ways to work on weight and changing eating patterns.  Pap smear as above   counseled on breast self exam, mammography screening, STD prevention, HIV risk factors and prevention, use and side effects of OCP's, adequate intake of calcium and vitamin D, diet and exercise  return annually or prn  An After Visit Summary was printed and given to the patient.

## 2015-07-09 NOTE — Patient Instructions (Signed)

## 2015-07-09 NOTE — Progress Notes (Signed)
Reviewed personally.  M. Suzanne Kerron Sedano, MD.  

## 2015-07-10 LAB — RPR

## 2015-07-10 LAB — HIV ANTIBODY (ROUTINE TESTING W REFLEX): HIV 1&2 Ab, 4th Generation: NONREACTIVE

## 2015-07-10 LAB — VITAMIN D 25 HYDROXY (VIT D DEFICIENCY, FRACTURES): Vit D, 25-Hydroxy: 49 ng/mL (ref 30–100)

## 2015-07-13 LAB — HSV(HERPES SIMPLEX VRS) I + II AB-IGG
HSV 1 Glycoprotein G Ab, IgG: 4.34 IV — ABNORMAL HIGH
HSV 2 Glycoprotein G Ab, IgG: <0.1 IV

## 2015-07-13 LAB — HEMOGLOBIN, FINGERSTICK: Hemoglobin, fingerstick: 11.8 g/dL — ABNORMAL LOW (ref 12.0–16.0)

## 2015-07-14 ENCOUNTER — Telehealth: Payer: Self-pay

## 2015-07-14 LAB — IPS N GONORRHOEA AND CHLAMYDIA BY PCR

## 2015-07-14 NOTE — Telephone Encounter (Signed)
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 History of oral if no may want to schedule OV for discussion HSV 2 negative Vitamin D is normal, would stay on OTC 800 Vit. D 3 daily to maintain HIV,RPR negative Hgb A1-c is normal Lipid panel looks great all normal  Liver, kidney and glucose panel is normal TSH normal GC,Chlamydia pending

## 2015-07-14 NOTE — Telephone Encounter (Signed)
Patient notified of results. See lab 

## 2015-07-14 NOTE — Telephone Encounter (Signed)
[email or web address]

## 2015-09-14 ENCOUNTER — Encounter: Payer: Self-pay | Admitting: Family

## 2015-09-14 ENCOUNTER — Ambulatory Visit (INDEPENDENT_AMBULATORY_CARE_PROVIDER_SITE_OTHER): Payer: 59 | Admitting: Family

## 2015-09-14 VITALS — BP 127/71 | HR 73 | Temp 97.7°F | Ht 65.0 in | Wt 251.6 lb

## 2015-09-14 DIAGNOSIS — J329 Chronic sinusitis, unspecified: Secondary | ICD-10-CM | POA: Diagnosis not present

## 2015-09-14 DIAGNOSIS — J310 Chronic rhinitis: Secondary | ICD-10-CM

## 2015-09-14 DIAGNOSIS — J309 Allergic rhinitis, unspecified: Secondary | ICD-10-CM | POA: Diagnosis not present

## 2015-09-14 DIAGNOSIS — R197 Diarrhea, unspecified: Secondary | ICD-10-CM | POA: Diagnosis not present

## 2015-09-14 MED ORDER — FLUTICASONE PROPIONATE 50 MCG/ACT NA SUSP: NASAL | Status: DC

## 2015-09-14 NOTE — Patient Instructions (Signed)

## 2015-09-14 NOTE — Progress Notes (Signed)
   Subjective:    Patient ID: Renee MorisRhonda F Wenrich, female    DOB: 02/13/1971, 44 y.o.   MRN: [id number]  Diarrhea  This is a new problem. The current episode started 1 to 4 weeks ago. The problem occurs 2 to 4 times per day. The problem has been unchanged. The stool consistency is described as watery. The patient states that diarrhea awakens her from sleep. Associated symptoms include abdominal pain, bloating, headaches and increased flatus. Pertinent negatives include no coughing, fever or vomiting. The symptoms are aggravated by stress and dairy products. She has tried nothing for the symptoms. The treatment provided no relief.  Ear Fullness  There is pain in the left ear. This is a new problem. The current episode started in the past 7 days. The problem occurs constantly. The problem has been gradually worsening. There has been no fever. The pain is at a severity of 4/10. The pain is mild. Associated symptoms include abdominal pain, diarrhea, ear discharge, headaches and rhinorrhea. Pertinent negatives include no coughing, hearing loss, sore throat or vomiting. The treatment provided mild relief.      Review of Systems  Constitutional: Negative.  Negative for fever.  HENT: Positive for ear discharge and rhinorrhea. Negative for hearing loss and sore throat.   Eyes: Negative.   Respiratory: Negative.  Negative for cough and shortness of breath.   Cardiovascular: Negative.  Negative for palpitations.  Gastrointestinal: Positive for abdominal pain, diarrhea, bloating and flatus. Negative for vomiting.  Endocrine: Negative.   Genitourinary: Negative.   Musculoskeletal: Negative.   Neurological: Positive for headaches.  Hematological: Negative.   Psychiatric/Behavioral: Negative.   All other systems reviewed and are negative.      Objective:   Physical Exam  Constitutional: She is oriented to person, place, and time. She appears well-developed and well-nourished. No distress.  HENT:  Head:  Normocephalic and atraumatic.  Right Ear: External ear normal.  Left Ear: There is tenderness. A middle ear effusion is present.  Nasal passage erythemas with moderate swelling Oropharynx erythemas     Eyes: Pupils are equal, round, and reactive to light.  Neck: Normal range of motion. Neck supple. No thyromegaly present.  Cardiovascular: Normal rate, regular rhythm, normal heart sounds and intact distal pulses.   No murmur heard. Pulmonary/Chest: Effort normal and breath sounds normal. No respiratory distress. She has no wheezes.  Abdominal: Soft. Bowel sounds are normal. She exhibits no distension. There is no tenderness.  Musculoskeletal: Normal range of motion. She exhibits no edema or tenderness.  Neurological: She is alert and oriented to person, place, and time. She has normal reflexes. No cranial nerve deficit.  Skin: Skin is warm and dry.  Psychiatric: She has a normal mood and affect. Her behavior is normal. Judgment and thought content normal.  Vitals reviewed.     BP 127/71 mmHg  Pulse 73  Temp(Src) 97.7 F (36.5 C) (Oral)  Ht 5\' 5"  (1.651 m)  Wt 251 lb 9.6 oz (114.125 kg)  BMI 41.87 kg/m2     Assessment & Plan:  1. Allergic rhinitis, unspecified allergic rhinitis type -Avoid allergens when possible - fluticasone (FLONASE) 50 MCG/ACT nasal spray; Use 1-2 sprays each nostril at bedtime  Dispense: 16 g; Refill: 6  2. Diarrhea, unspecified type -Probiotic daily -Daily yogurt -Bland diet -Force fluids -Imodium as needed -RTO prn   Jannifer Rodneyhristy Annalyse Langlais, FNP

## 2015-09-29 ENCOUNTER — Other Ambulatory Visit: Payer: Self-pay | Admitting: Family Medicine

## 2015-10-16 ENCOUNTER — Ambulatory Visit (INDEPENDENT_AMBULATORY_CARE_PROVIDER_SITE_OTHER): Payer: 59 | Admitting: Nurse Practitioner

## 2015-10-16 VITALS — BP 131/83 | HR 62 | Temp 97.3°F | Ht 65.0 in | Wt 249.4 lb

## 2015-10-16 DIAGNOSIS — L03211 Cellulitis of face: Secondary | ICD-10-CM

## 2015-10-16 MED ORDER — DOXYCYCLINE HYCLATE 100 MG PO TABS: 100.0000 mg | ORAL_TABLET | Freq: Two times a day (BID) | ORAL | Status: DC

## 2015-10-16 MED ORDER — MUPIROCIN CALCIUM 2 % EX CREA: 1.0000 "application " | TOPICAL_CREAM | Freq: Two times a day (BID) | CUTANEOUS | Status: DC

## 2015-10-16 NOTE — Progress Notes (Signed)
   Subjective:    Patient ID: Renee Thomas, female    DOB: 01/31/1971, 44 y.o.   MRN: [id number]  HPI Patient comes in today c/o right eye pain- started yesterday- red swollen, no drainage as of yet.    Review of Systems  Constitutional: Negative.   Eyes: Positive for pain, redness and itching. Negative for photophobia, discharge and visual disturbance.  Respiratory: Negative.   Cardiovascular: Negative.   Genitourinary: Negative.   Neurological: Negative.   Psychiatric/Behavioral: Negative.   All other systems reviewed and are negative.      Objective:   Physical Exam  Constitutional: She appears well-developed and well-nourished. No distress.  HENT:  Right Ear: External ear normal.  Left Ear: External ear normal.  Nose: Nose normal.  Mouth/Throat: Oropharynx is clear and moist.  Eyes: Conjunctivae are normal. Pupils are equal, round, and reactive to light.  Patch of pustules right Mccombie rlid- mild edema- no drainage  Neck: Normal range of motion.  Neurological: She is alert.  Skin: Skin is warm.    BP 131/83 mmHg  Pulse 62  Temp(Src) 97.3 F (36.3 C) (Oral)  Ht 5\' 5"  (1.651 m)  Wt 249 lb 6.4 oz (113.127 kg)  BMI 41.50 kg/m2       Assessment & Plan:  1. Cellulitis of face Wash eye with baby shampoo Good hand washing if touch area-  RTO if worsening - doxycycline (VIBRA-TABS) 100 MG tablet; Take 1 tablet (100 mg total) by mouth 2 (two) times daily. 1 po bid  Dispense: 20 tablet; Refill: 0 - mupirocin cream (BACTROBAN) 2 %; Apply 1 application topically 2 (two) times daily.  Dispense: 15 g; Refill: 0  [name] Daphine DeutscherMartin, FNP

## 2016-07-17 ENCOUNTER — Other Ambulatory Visit: Payer: Self-pay | Admitting: Certified Nurse Midwife

## 2016-07-17 DIAGNOSIS — Z3041 Encounter for surveillance of contraceptive pills: Secondary | ICD-10-CM

## 2016-07-17 NOTE — Telephone Encounter (Signed)
Medication refill request: Spronyx Last AEX:  07/09/15 DL Next AEX: 4/09/819/15/17 DL Last MMG (if hormonal medication request): 11/03/15 [id number] Refill authorized: 07/09/15 #[id number]. Please advise. Thank you.

## 2016-07-21 ENCOUNTER — Ambulatory Visit: Payer: 59 | Admitting: Certified Nurse Midwife

## 2016-08-25 ENCOUNTER — Ambulatory Visit (INDEPENDENT_AMBULATORY_CARE_PROVIDER_SITE_OTHER): Payer: BLUE CROSS/BLUE SHIELD | Admitting: Certified Nurse Midwife

## 2016-08-25 ENCOUNTER — Encounter: Payer: Self-pay | Admitting: Certified Nurse Midwife

## 2016-08-25 VITALS — BP 112/74 | HR 70 | Resp 16 | Ht 65.75 in | Wt 255.0 lb

## 2016-08-25 DIAGNOSIS — Z3041 Encounter for surveillance of contraceptive pills: Secondary | ICD-10-CM | POA: Diagnosis not present

## 2016-08-25 DIAGNOSIS — Z124 Encounter for screening for malignant neoplasm of cervix: Secondary | ICD-10-CM

## 2016-08-25 DIAGNOSIS — Z0000 Encounter for general adult medical examination without abnormal findings: Secondary | ICD-10-CM

## 2016-08-25 DIAGNOSIS — Z01419 Encounter for gynecological examination (general) (routine) without abnormal findings: Secondary | ICD-10-CM | POA: Diagnosis not present

## 2016-08-25 DIAGNOSIS — Z1151 Encounter for screening for human papillomavirus (HPV): Secondary | ICD-10-CM | POA: Diagnosis not present

## 2016-08-25 LAB — POCT URINALYSIS DIPSTICK
Bilirubin, UA: NEGATIVE
Glucose, UA: NEGATIVE
Ketones, UA: NEGATIVE
Nitrite, UA: NEGATIVE
Protein, UA: NEGATIVE
Urobilinogen, UA: NEGATIVE
pH, UA: 5

## 2016-08-25 LAB — COMPREHENSIVE METABOLIC PANEL
ALT: 13 U/L (ref 6–29)
AST: 14 U/L (ref 10–35)
Albumin: 4 g/dL (ref 3.6–5.1)
Alkaline Phosphatase: 70 U/L (ref 33–115)
BUN: 15 mg/dL (ref 7–25)
CO2: 25 mmol/L (ref 20–31)
Calcium: 9.1 mg/dL (ref 8.6–10.2)
Chloride: 104 mmol/L (ref 98–110)
Creat: 0.79 mg/dL (ref 0.50–1.10)
Glucose, Bld: 74 mg/dL (ref 65–99)
Potassium: 4.4 mmol/L (ref 3.5–5.3)
Sodium: 139 mmol/L (ref 135–146)
Total Bilirubin: 0.7 mg/dL (ref 0.2–1.2)
Total Protein: 7.3 g/dL (ref 6.1–8.1)

## 2016-08-25 LAB — LIPID PANEL
Cholesterol: 189 mg/dL (ref 125–200)
HDL: 70 mg/dL (ref >=46)
LDL Cholesterol: 108 mg/dL (ref <130)
Total CHOL/HDL Ratio: 2.7 Ratio (ref <=5.0)
Triglycerides: 55 mg/dL (ref <150)
VLDL: 11 mg/dL (ref <30)

## 2016-08-25 MED ORDER — LEVONORGESTREL-ETHINYL ESTRAD 0.1-20 MG-MCG PO TABS: 1.0000 | ORAL_TABLET | Freq: Every day | ORAL | 12 refills | Status: DC

## 2016-08-25 NOTE — Patient Instructions (Signed)

## 2016-08-25 NOTE — Progress Notes (Signed)
45 y.o. G0P0000 Single  African American Fe here for annual exam. Periods normal, just lighter and missed only one in past year. Sees PCP Paulene Floor for aex/ asthma and GERD management, all stable per patient. Screening labs today requested. Working on [employer] and staying stable. Desires screening labs. No other health issues today.  Patient's last menstrual period was 08/24/2016.          Sexually active: No.  The current method of family planning is OCP (estrogen/progesterone).    Exercising: No.  exercise Smoker:  no  Health Maintenance: Pap:  07-01-14 neg MMG:  11-03-15 category a density birads 1:neg Colonoscopy:  none BMD:   none TDaP:  2014 Shingles: not done Pneumonia: not done Hep C and HIV: HIV neg 2016 Labs: rbc 2+, wbc 1+ on cycle, hgb-12.4 Self breast exam: done monthly   reports that she has never smoked. She has never used smokeless tobacco. She reports that she does not drink alcohol or use drugs.  Past Medical History:  Diagnosis Date  . Anemia   . GERD (gastroesophageal reflux disease)   . MVP (mitral valve prolapse)     History reviewed. No pertinent surgical history.  Current Outpatient Prescriptions  Medication Sig Dispense Refill  . Cholecalciferol (VITAMIN D PO) Take by mouth daily.    . fluticasone (FLONASE) 50 MCG/ACT nasal spray Use 1-2 sprays each nostril at bedtime 16 g 6  . KRILL OIL PO Take by mouth.    . Multiple Vitamins-Minerals (MULTIVITAMIN PO) Take by mouth daily.     Marland Kitchen omeprazole (PRILOSEC) 20 MG capsule TAKE 1 CAPSULE (20 MG TOTAL) BY MOUTH DAILY. 90 capsule 1  . Probiotic Product (TRUBIOTICS PO) Take by mouth daily.    . SRONYX 0.1-20 MG-MCG tablet TAKE 1 TABLET BY MOUTH DAILY. 28 tablet 0   No current facility-administered medications for this visit.     Family History  Problem Relation Age of Onset  . Hypertension Mother   . Diabetes Mother   . Other Mother     renal disease  . Hypertension Father   . Heart attack  Father   . Hypertension Sister   . Diabetes Brother   . Diabetes Brother     ROS:  Pertinent items are noted in HPI.  Otherwise, a comprehensive ROS was negative.  Exam:   BP 112/74   Pulse 70   Resp 16   Ht 5' 5.75" (1.67 m)   Wt 255 lb (115.7 kg)   LMP 08/24/2016   BMI 41.47 kg/m  Height: 5' 5.75" (167 cm) Ht Readings from Last 3 Encounters:  08/25/16 5' 5.75" (1.67 m)  10/16/15 5\' 5"  (1.651 m)  09/14/15 5\' 5"  (1.651 m)    General appearance: alert, cooperative and appears stated age Head: Normocephalic, without obvious abnormality, atraumatic Neck: no adenopathy, supple, symmetrical, trachea midline and thyroid normal to inspection and palpation Lungs: clear to auscultation bilaterally Breasts: normal appearance, no masses or tenderness, No nipple retraction or dimpling, No nipple discharge or bleeding, No axillary or supraclavicular adenopathy Heart: regular rate and rhythm Abdomen: soft, non-tender; no masses,  no organomegaly Extremities: extremities normal, atraumatic, no cyanosis or edema Skin: Skin color, texture, turgor normal. No rashes or lesions Lymph nodes: Cervical, supraclavicular, and axillary nodes normal. No abnormal inguinal nodes palpated Neurologic: Grossly normal   Pelvic: External genitalia:  no lesions              Urethra:  normal appearing urethra with no masses,  tenderness or lesions              Bartholin's and Skene's: normal                 Vagina: normal appearing vagina with normal color and discharge, no lesions              Cervix: no cervical motion tenderness, no lesions, nulliparous appearance and retroverted              Pap taken: Yes.   Bimanual Exam:  Uterus:  normal size, contour, position, consistency, mobility, non-tender              Adnexa: normal adnexa and no mass, fullness, tenderness               Rectovaginal: Confirms               Anus:  normal sphincter tone, no lesions  Chaperone present: yes  A:  Well Woman  with normal exam  Contraception OCP desired  Asthma/GERD management with PCP  Screening labs     P:   Reviewed health and wellness pertinent to exam  Rx Sronyx see order with instructions  Labs: Vitamin D, CMP, Lipid panel, Hep C  Follow up with PCP as indicated  Pap smear as above with HPVHR   counseled on breast self exam, mammography screening, STD prevention, HIV risk factors and prevention, adequate intake of calcium and vitamin D, diet and exercise  return annually or prn  An After Visit Summary was printed and given to the patient.

## 2016-08-26 LAB — VITAMIN D 25 HYDROXY (VIT D DEFICIENCY, FRACTURES): Vit D, 25-Hydroxy: 26 ng/mL — ABNORMAL LOW (ref 30–100)

## 2016-08-26 LAB — HEPATITIS C ANTIBODY: HCV Ab: NEGATIVE

## 2016-08-29 ENCOUNTER — Telehealth: Payer: Self-pay

## 2016-08-29 DIAGNOSIS — E559 Vitamin D deficiency, unspecified: Secondary | ICD-10-CM

## 2016-08-29 NOTE — Telephone Encounter (Signed)
Vit d order placed

## 2016-08-30 LAB — IPS PAP TEST WITH HPV

## 2016-09-01 NOTE — Progress Notes (Signed)
Encounter reviewed Renee Falter, MD   

## 2016-10-11 ENCOUNTER — Encounter: Payer: Self-pay | Admitting: Nurse Practitioner

## 2016-10-11 ENCOUNTER — Ambulatory Visit (INDEPENDENT_AMBULATORY_CARE_PROVIDER_SITE_OTHER): Payer: BLUE CROSS/BLUE SHIELD | Admitting: Nurse Practitioner

## 2016-10-11 VITALS — BP 148/87 | HR 59 | Temp 98.2°F | Ht 67.75 in | Wt 259.4 lb

## 2016-10-11 DIAGNOSIS — M7712 Lateral epicondylitis, left elbow: Secondary | ICD-10-CM | POA: Diagnosis not present

## 2016-10-11 DIAGNOSIS — R5383 Other fatigue: Secondary | ICD-10-CM

## 2016-10-11 DIAGNOSIS — R002 Palpitations: Secondary | ICD-10-CM | POA: Diagnosis not present

## 2016-10-11 MED ORDER — NAPROXEN 500 MG PO TABS: 500.0000 mg | ORAL_TABLET | Freq: Two times a day (BID) | ORAL | 1 refills | Status: DC

## 2016-10-11 NOTE — Progress Notes (Signed)
   Subjective:    Patient ID: Renee Thomas, female    DOB: 07-21-1971, 45 y.o.   MRN: [id number]  HPI Patient comes in today c/o: -Hypertension- said she was on her way to work the other night and felt like she was going to pass out- checked her blood pressure when she got to work and it was 037 systolic. SHe is not on any blood pressure medications. During this episode her heart rate was up. - left elbow pain that radiates down her arm Aches all night long- rates pain a 8-9/10 when it bothers her. Pain is worst after she gets done working.  - fatigue- started a couple of months ago- never quite feels rested.  Review of Systems  Constitutional: Positive for fatigue.  HENT: Negative.   Respiratory: Negative.  Negative for shortness of breath.   Cardiovascular: Negative for chest pain, palpitations and leg swelling.  Gastrointestinal: Negative.   Genitourinary: Negative.   Musculoskeletal:       Left elbow pain  Neurological: Negative.   Psychiatric/Behavioral: Negative.   All other systems reviewed and are negative.      Objective:   Physical Exam  Constitutional: She is oriented to person, place, and time. She appears well-developed and well-nourished. No distress.  Cardiovascular: Normal rate, regular rhythm and normal heart sounds.   Pulmonary/Chest: Effort normal and breath sounds normal.  Musculoskeletal:  Pain on palpation lateral condyle of  Left elbow pain in elbow with gripping  Neurological: She is alert and oriented to person, place, and time.  Skin: Skin is warm.  Psychiatric: She has a normal mood and affect. Her behavior is normal. Judgment and thought content normal.   BP (!) 148/87 (BP Location: Right Arm, Patient Position: Sitting, Cuff Size: Large)   Pulse (!) 59   Temp 98.2 F (36.8 C) (Oral)   Ht 5' 7.75" (1.721 m)   Wt 259 lb 6.4 oz (117.7 kg)   BMI 39.73 kg/m        Assessment & Plan:  1. Palpitations Avoid caffeine Keep diary of episodes Check  blood pressure daily and keep diary Follow up in 1 month  2. Lateral epicondylitis of left elbow Tennis elbow strap Rest as much as possible - naproxen (NAPROSYN) 500 MG tablet; Take 1 tablet (500 mg total) by mouth 2 (two) times daily with a meal.  Dispense: 60 tablet; Refill: 1  3. Fatigue, unspecified type Labs pending - CMP14+EGFR - Thyroid Panel With TSH - Anemia Profile B  [name] Hassell Done, FNP

## 2016-10-11 NOTE — Patient Instructions (Signed)
Tennis Elbow Tennis elbow is puffiness (inflammation) of the outer tendons of your forearm close to your elbow. Your tendons attach your muscles to your bones. Tennis elbow can happen in any sport or job in which you use your elbow too much. It is caused by doing the same motion over and over. Tennis elbow can cause:  Pain and tenderness in your forearm and the outer part of your elbow.  A burning feeling. This runs from your elbow through your arm.  Weak grip in your hands. Follow these instructions at home: Activity  Rest your elbow and wrist as told by your doctor. Try to avoid any activities that caused the problem until your doctor says that you can do them again.  If a physical therapist teaches you exercises, do all of them as told.  If you lift an object, lift it with your palm facing up. This is easier on your elbow. Lifestyle  If your tennis elbow is caused by sports, check your equipment and make sure that:  You are using it correctly.  It fits you well.  If your tennis elbow is caused by work, take breaks often, if you are able. Talk with your manager about doing your work in a way that is safe for you.  If your tennis elbow is caused by computer use, talk with your manager about any changes that can be made to your work setup. General instructions  If told, apply ice to the painful area:  Put ice in a plastic bag.  Place a towel between your skin and the bag.  Leave the ice on for 20 minutes, 2-3 times per day.  Take medicines only as told by your doctor.  If you were given a brace, wear it as told by your doctor.  Keep all follow-up visits as told by your doctor. This is important. Contact a doctor if:  Your pain does not get better with treatment.  Your pain gets worse.  You have weakness in your forearm, hand, or fingers.  You cannot feel your forearm, hand, or fingers. This information is not intended to replace advice given to you by your health  care provider. Make sure you discuss any questions you have with your health care provider. Document Released: 04/12/2010 Document Revised: 06/22/2016 Document Reviewed: 10/19/2014 Elsevier Interactive Patient Education  2017 [employer]  

## 2016-10-12 LAB — THYROID PANEL WITH TSH
Free Thyroxine Index: 2.3 (ref 1.2–4.9)
T3 Uptake Ratio: 22 % — ABNORMAL LOW (ref 24–39)
T4, Total: 10.6 ug/dL (ref 4.5–12.0)
TSH: 1.7 u[IU]/mL (ref 0.450–4.500)

## 2016-10-12 LAB — ANEMIA PROFILE B
Basophils Absolute: 0 10*3/uL (ref 0.0–0.2)
Basos: 1 %
EOS (ABSOLUTE): 0.1 10*3/uL (ref 0.0–0.4)
Eos: 2 %
Ferritin: 38 ng/mL (ref 15–150)
Folate: 7.8 ng/mL (ref >3.0)
Hematocrit: 37.8 % (ref 34.0–46.6)
Hemoglobin: 12.3 g/dL (ref 11.1–15.9)
Immature Grans (Abs): 0 10*3/uL (ref 0.0–0.1)
Immature Granulocytes: 0 %
Iron Saturation: 27 % (ref 15–55)
Iron: 91 ug/dL (ref 27–159)
Lymphocytes Absolute: 1.9 10*3/uL (ref 0.7–3.1)
Lymphs: 41 %
MCH: 29.9 pg (ref 26.6–33.0)
MCHC: 32.5 g/dL (ref 31.5–35.7)
MCV: 92 fL (ref 79–97)
Monocytes Absolute: 0.4 10*3/uL (ref 0.1–0.9)
Monocytes: 8 %
Neutrophils Absolute: 2.2 10*3/uL (ref 1.4–7.0)
Neutrophils: 48 %
Platelets: 346 10*3/uL (ref 150–379)
RBC: 4.12 x10E6/uL (ref 3.77–5.28)
RDW: 13.8 % (ref 12.3–15.4)
Retic Ct Pct: 1.1 % (ref 0.6–2.6)
Total Iron Binding Capacity: 340 ug/dL (ref 250–450)
UIBC: 249 ug/dL (ref 131–425)
Vitamin B-12: 178 pg/mL — ABNORMAL LOW (ref 232–1245)
WBC: 4.6 10*3/uL (ref 3.4–10.8)

## 2016-10-12 LAB — CMP14+EGFR
ALT: 8 IU/L (ref 0–32)
AST: 9 IU/L (ref 0–40)
Albumin/Globulin Ratio: 1.2 (ref 1.2–2.2)
Albumin: 3.8 g/dL (ref 3.5–5.5)
Alkaline Phosphatase: 69 IU/L (ref 39–117)
BUN/Creatinine Ratio: 19 (ref 9–23)
BUN: 14 mg/dL (ref 6–24)
Bilirubin Total: 0.5 mg/dL (ref 0.0–1.2)
CO2: 22 mmol/L (ref 18–29)
Calcium: 9.2 mg/dL (ref 8.7–10.2)
Chloride: 100 mmol/L (ref 96–106)
Creatinine, Ser: 0.75 mg/dL (ref 0.57–1.00)
GFR calc Af Amer: 110 mL/min/{1.73_m2} (ref >59)
GFR calc non Af Amer: 95 mL/min/{1.73_m2} (ref >59)
Globulin, Total: 3.3 g/dL (ref 1.5–4.5)
Glucose: 78 mg/dL (ref 65–99)
Potassium: 4.3 mmol/L (ref 3.5–5.2)
Sodium: 140 mmol/L (ref 134–144)
Total Protein: 7.1 g/dL (ref 6.0–8.5)

## 2016-10-16 ENCOUNTER — Telehealth: Payer: Self-pay | Admitting: Nurse Practitioner

## 2016-10-16 NOTE — Telephone Encounter (Signed)
Spoke with pt and she is getting her first injection Wednesday and wanted to make sure she wouldn't have any major Side Effects. Advised pt the common side effects are at the injection site, redness, discoloration and soreness. Pt voiced understanding.

## 2016-10-18 ENCOUNTER — Ambulatory Visit (INDEPENDENT_AMBULATORY_CARE_PROVIDER_SITE_OTHER): Payer: BLUE CROSS/BLUE SHIELD

## 2016-10-18 DIAGNOSIS — E538 Deficiency of other specified B group vitamins: Secondary | ICD-10-CM

## 2016-10-18 MED ORDER — CYANOCOBALAMIN 1000 MCG/ML IJ SOLN
1000.0000 ug | INTRAMUSCULAR | Status: DC
  Administered 2016-10-18 – 2018-08-22 (×26): 1000 ug via INTRAMUSCULAR

## 2016-10-25 ENCOUNTER — Ambulatory Visit (INDEPENDENT_AMBULATORY_CARE_PROVIDER_SITE_OTHER): Payer: BLUE CROSS/BLUE SHIELD | Admitting: *Deleted

## 2016-10-25 DIAGNOSIS — E538 Deficiency of other specified B group vitamins: Secondary | ICD-10-CM | POA: Diagnosis not present

## 2016-10-25 NOTE — Progress Notes (Signed)
Pt given Vit B12 inj Tolerated well 

## 2016-11-01 ENCOUNTER — Ambulatory Visit (INDEPENDENT_AMBULATORY_CARE_PROVIDER_SITE_OTHER): Payer: BLUE CROSS/BLUE SHIELD | Admitting: *Deleted

## 2016-11-01 DIAGNOSIS — E538 Deficiency of other specified B group vitamins: Secondary | ICD-10-CM | POA: Diagnosis not present

## 2016-11-01 NOTE — Progress Notes (Signed)
Pt given Vit B12 inj Tolerated well 

## 2016-11-08 ENCOUNTER — Ambulatory Visit (INDEPENDENT_AMBULATORY_CARE_PROVIDER_SITE_OTHER): Payer: BLUE CROSS/BLUE SHIELD | Admitting: *Deleted

## 2016-11-08 DIAGNOSIS — E538 Deficiency of other specified B group vitamins: Secondary | ICD-10-CM

## 2016-11-08 NOTE — Progress Notes (Signed)
Pt given Vit B12 inj Tolerated well 

## 2016-11-15 ENCOUNTER — Other Ambulatory Visit (INDEPENDENT_AMBULATORY_CARE_PROVIDER_SITE_OTHER): Payer: BLUE CROSS/BLUE SHIELD

## 2016-11-15 ENCOUNTER — Telehealth: Payer: Self-pay | Admitting: *Deleted

## 2016-11-15 ENCOUNTER — Other Ambulatory Visit: Payer: Self-pay | Admitting: Certified Nurse Midwife

## 2016-11-15 ENCOUNTER — Telehealth: Payer: Self-pay | Admitting: Certified Nurse Midwife

## 2016-11-15 DIAGNOSIS — E559 Vitamin D deficiency, unspecified: Secondary | ICD-10-CM | POA: Diagnosis not present

## 2016-11-15 DIAGNOSIS — Z1231 Encounter for screening mammogram for malignant neoplasm of breast: Secondary | ICD-10-CM | POA: Diagnosis not present

## 2016-11-15 DIAGNOSIS — B009 Herpesviral infection, unspecified: Secondary | ICD-10-CM

## 2016-11-15 MED ORDER — VALACYCLOVIR HCL 1 G PO TABS: 1000.0000 mg | ORAL_TABLET | Freq: Two times a day (BID) | ORAL | 0 refills | Status: DC

## 2016-11-15 NOTE — Telephone Encounter (Signed)
Closed in error, see new telephone encounter dated 11/15/16.

## 2016-11-15 NOTE — Telephone Encounter (Signed)
Spoke with patient. Patient states she has a cold sore on her mouth. 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 Patient states she was to call if she ever needed a medication for this. Patient states she would like to get prescription sent to pharmacy on file. Advised patient will review with Leota Sauerseborah Leonard, CNM and return call with recommendations. Patient is agreeable.  Leota Sauerseborah Leonard, CNM please advise?

## 2016-11-15 NOTE — Progress Notes (Unsigned)
GYNECOLOGY  VISIT   HPI: 46 y.o.   Single  {Race/ethnicity:17218}  female   [id number] with No LMP recorded.   here for     GYNECOLOGIC HISTORY: No LMP recorded. Contraception:  *** Menopausal hormone therapy:  *** Last mammogram:  *** Last pap smear:   ***        OB History    Gravida Para Term Preterm AB Living   0 0 0 0 0 0   SAB TAB Ectopic Multiple Live Births   0 0 0 0           There are no active problems to display for this patient.   Past Medical History:  Diagnosis Date  . Anemia   . GERD (gastroesophageal reflux disease)   . MVP (mitral valve prolapse)     No past surgical history on file.  Current Outpatient Prescriptions  Medication Sig Dispense Refill  . Cholecalciferol (VITAMIN D PO) Take by mouth daily.    . fluticasone (FLONASE) 50 MCG/ACT nasal spray Use 1-2 sprays each nostril at bedtime 16 g 6  . KRILL OIL PO Take by mouth.    . levonorgestrel-ethinyl estradiol (SRONYX) 0.1-20 MG-MCG tablet Take 1 tablet by mouth daily. 28 tablet 12  . Multiple Vitamins-Minerals (MULTIVITAMIN PO) Take by mouth daily.     . naproxen (NAPROSYN) 500 MG tablet Take 1 tablet (500 mg total) by mouth 2 (two) times daily with a meal. 60 tablet 1  . omeprazole (PRILOSEC) 20 MG capsule TAKE 1 CAPSULE (20 MG TOTAL) BY MOUTH DAILY. 90 capsule 1  . Probiotic Product (TRUBIOTICS PO) Take by mouth daily.     Current Facility-Administered Medications  Medication Dose Route Frequency Provider Last Rate Last Dose  . cyanocobalamin ((VITAMIN B-12)) injection 1,000 mcg  1,000 mcg Intramuscular Weekly [name] Martin, FNP   1,000 mcg at 11/08/16 1013     ALLERGIES: Eggs or egg-derived products; Amoxicillin; Penicillins; and Sulfa antibiotics  Family History  Problem Relation Age of Onset  . Hypertension Mother   . Diabetes Mother   . Other Mother     renal disease  . Hypertension Father   . Heart attack Father   . Hypertension Sister   . Diabetes Brother   . Diabetes  Brother     Social History   Social History  . Marital status: Single    Spouse name: N/A  . Number of children: N/A  . Years of education: N/A   Occupational History  . Not on file.   Social History Main Topics  . Smoking status: Never Smoker  . Smokeless tobacco: Never Used  . Alcohol use No  . Drug use: No  . Sexual activity: No   Other Topics Concern  . Not on file   Social History Narrative  . No narrative on file    ROS:  Pertinent items are noted in HPI.  PHYSICAL EXAMINATION:    There were no vitals taken for this visit.    General appearance: alert, cooperative and appears stated age Head: Normocephalic, without obvious abnormality, atraumatic Neck: no adenopathy, supple, symmetrical, trachea midline and thyroid normal to inspection and palpation Lungs: clear to auscultation bilaterally Breasts: normal appearance, no masses or tenderness, No nipple retraction or dimpling, No nipple discharge or bleeding, No axillary or supraclavicular adenopathy Heart: regular rate and rhythm Abdomen: soft, non-tender, no masses,  no organomegaly Extremities: extremities normal, atraumatic, no cyanosis or edema Skin: Skin color, texture, turgor normal. No rashes or lesions  Lymph nodes: Cervical, supraclavicular, and axillary nodes normal. No abnormal inguinal nodes palpated Neurologic: Grossly normal  Pelvic: External genitalia:  no lesions              Urethra:  normal appearing urethra with no masses, tenderness or lesions              Bartholins and Skenes: normal                 Vagina: normal appearing vagina with normal color and discharge, no lesions              Cervix: no lesions                Bimanual Exam:  Uterus:  normal size, contour, position, consistency, mobility, non-tender              Adnexa: no mass, fullness, tenderness              Rectal exam: {yes no:314532}.  Confirms.              Anus:  normal sphincter tone, no lesions  Chaperone was  present for exam.  ASSESSMENT     PLAN     An After Visit Summary was printed and given to the patient.  ______ minutes face to face time of which over 50% was spent in counseling.   [id number]tttttttttttttttttttttttttttttttttttttttttttttttttttttttttttttttttttttttttttttttttttttttttttttttttttttttttttttttttttttttttttttttttttttttttttttttttttttttttttttttttttttttttttttttttttttt 55

## 2016-11-15 NOTE — Telephone Encounter (Signed)
Spoke with patient, advised as seen below per Leota Sauerseborah Leonard, CNM. Reviewed administration instructions with patient; patient read back "take 1 pill twice a day by mouth for one day for oral outbreak". Patient verbalizes understanding and is agreeable.  Routing to provider for final review. Patient is agreeable to disposition. Will close encounter.

## 2016-11-15 NOTE — Telephone Encounter (Signed)
Renee Thomas 10 minutes ago (4:10 PM)   Patient requesting a script for cold sore medication

## 2016-11-15 NOTE — Telephone Encounter (Signed)
Patient requesting a script for cold sore medication

## 2016-11-15 NOTE — Telephone Encounter (Signed)
We did discuss treatment if needed. Order placed for Valtrex 2 gm bid x 1 day with refills. Please inform patient and give instructions.

## 2016-11-16 ENCOUNTER — Encounter: Payer: Self-pay | Admitting: Certified Nurse Midwife

## 2016-11-16 LAB — VITAMIN D 25 HYDROXY (VIT D DEFICIENCY, FRACTURES): Vit D, 25-Hydroxy: 30 ng/mL (ref 30–100)

## 2016-11-17 ENCOUNTER — Encounter: Payer: Self-pay | Admitting: Nurse Practitioner

## 2016-11-17 ENCOUNTER — Ambulatory Visit (INDEPENDENT_AMBULATORY_CARE_PROVIDER_SITE_OTHER): Payer: BLUE CROSS/BLUE SHIELD | Admitting: Nurse Practitioner

## 2016-11-17 VITALS — BP 125/76 | HR 67 | Temp 98.2°F | Ht 67.0 in | Wt 259.0 lb

## 2016-11-17 DIAGNOSIS — R42 Dizziness and giddiness: Secondary | ICD-10-CM | POA: Diagnosis not present

## 2016-11-17 DIAGNOSIS — R7989 Other specified abnormal findings of blood chemistry: Secondary | ICD-10-CM | POA: Diagnosis not present

## 2016-11-17 DIAGNOSIS — E538 Deficiency of other specified B group vitamins: Secondary | ICD-10-CM

## 2016-11-17 MED ORDER — FLUTICASONE PROPIONATE 50 MCG/ACT NA SUSP: NASAL | 6 refills | Status: DC

## 2016-11-17 MED ORDER — OMEPRAZOLE 20 MG PO CPDR: DELAYED_RELEASE_CAPSULE | ORAL | 1 refills | Status: DC

## 2016-11-17 NOTE — Addendum Note (Signed)
Addended by: Bennie PieriniMARTIN, [name] on: 11/17/2016 09:10 AM   Modules accepted: Orders

## 2016-11-17 NOTE — Progress Notes (Signed)
   Subjective:    Patient ID: Lianne MorisRhonda F Malkowski, female    DOB: 05/31/1971, 46 y.o.   MRN: [id number]  HPI  Patient comes in today c/o dizziness. We did lab work the last time she was here for fatigue and found that her b12 was low. We started her on b12 injections- she says the dizziness had gotten much better - happening only 1 x a week now and only last a few minutes. Today she has a fever blister on lip and is on valtrex which has helped.   Review of Systems  Constitutional: Negative.   HENT: Negative.   Respiratory: Negative.   Cardiovascular: Negative.   Gastrointestinal: Negative.   Genitourinary: Negative.   Neurological: Positive for dizziness.  Psychiatric/Behavioral: Negative.   All other systems reviewed and are negative.      Objective:   Physical Exam  Constitutional: She appears well-developed and well-nourished. No distress.  Cardiovascular: Normal rate and regular rhythm.   Pulmonary/Chest: Effort normal and breath sounds normal.  Skin:  Non draining fever blister on right side of top lip.    BP 125/76   Pulse 67   Temp 98.2 F (36.8 C) (Oral)   Ht 5\' 7"  (1.702 m)   Wt 259 lb (117.5 kg)   BMI 40.57 kg/m        Assessment & Plan:   1. Vertigo   2. Low serum vitamin B12    Continue b12 injections as indicated Keep diary of vertigo RTO in 6 months   [name] Daphine DeutscherMartin, FNP

## 2016-11-22 ENCOUNTER — Ambulatory Visit: Payer: BLUE CROSS/BLUE SHIELD

## 2016-11-23 ENCOUNTER — Ambulatory Visit: Payer: Self-pay

## 2016-11-24 ENCOUNTER — Ambulatory Visit (INDEPENDENT_AMBULATORY_CARE_PROVIDER_SITE_OTHER): Payer: BLUE CROSS/BLUE SHIELD | Admitting: *Deleted

## 2016-11-24 DIAGNOSIS — E538 Deficiency of other specified B group vitamins: Secondary | ICD-10-CM | POA: Diagnosis not present

## 2016-11-24 NOTE — Progress Notes (Signed)
Pt given Vit B12 inj Tolerated well 

## 2016-12-06 ENCOUNTER — Ambulatory Visit (INDEPENDENT_AMBULATORY_CARE_PROVIDER_SITE_OTHER): Payer: BLUE CROSS/BLUE SHIELD | Admitting: *Deleted

## 2016-12-06 DIAGNOSIS — E538 Deficiency of other specified B group vitamins: Secondary | ICD-10-CM

## 2016-12-14 ENCOUNTER — Other Ambulatory Visit: Payer: Self-pay | Admitting: *Deleted

## 2016-12-14 ENCOUNTER — Other Ambulatory Visit: Payer: Self-pay

## 2016-12-14 DIAGNOSIS — M7712 Lateral epicondylitis, left elbow: Secondary | ICD-10-CM

## 2016-12-14 DIAGNOSIS — B009 Herpesviral infection, unspecified: Secondary | ICD-10-CM

## 2016-12-14 MED ORDER — NAPROXEN 500 MG PO TABS: 500.0000 mg | ORAL_TABLET | Freq: Two times a day (BID) | ORAL | 0 refills | Status: DC

## 2016-12-14 MED ORDER — VALACYCLOVIR HCL 1 G PO TABS: 1000.0000 mg | ORAL_TABLET | Freq: Two times a day (BID) | ORAL | 4 refills | Status: DC

## 2016-12-14 NOTE — Telephone Encounter (Signed)
Medication refill request: valtrex  Last AEX:  08/25/16 DL Next AEX: 16/08/9609/24/18 DL  Last MMG (if hormonal medication request): 11/15/16 BIRADS1:neg  Refill authorized: 11/15/16 #[id number]. Today please advise.

## 2016-12-15 ENCOUNTER — Other Ambulatory Visit: Payer: Self-pay

## 2016-12-15 DIAGNOSIS — B009 Herpesviral infection, unspecified: Secondary | ICD-10-CM

## 2016-12-15 NOTE — Telephone Encounter (Signed)
Entered in error

## 2016-12-20 ENCOUNTER — Ambulatory Visit (INDEPENDENT_AMBULATORY_CARE_PROVIDER_SITE_OTHER): Payer: BLUE CROSS/BLUE SHIELD

## 2016-12-20 DIAGNOSIS — E538 Deficiency of other specified B group vitamins: Secondary | ICD-10-CM | POA: Diagnosis not present

## 2017-01-11 ENCOUNTER — Other Ambulatory Visit: Payer: Self-pay

## 2017-01-11 DIAGNOSIS — B009 Herpesviral infection, unspecified: Secondary | ICD-10-CM

## 2017-01-11 MED ORDER — VALACYCLOVIR HCL 1 G PO TABS: 1000.0000 mg | ORAL_TABLET | Freq: Two times a day (BID) | ORAL | 0 refills | Status: DC

## 2017-01-11 NOTE — Telephone Encounter (Signed)
Medication refill request: Valacyclovir Hcl 1gm Last AEX:  08/25/16 DL Next AEX: 40/98/1110/24/18  Last MMG (if hormonal medication request): 11/15/16 BIRADS 1 negative Refill authorized: 12/14/16 for 30-day supply; pharmacy requesting 90-day supply, please advise

## 2017-01-17 ENCOUNTER — Other Ambulatory Visit: Payer: Self-pay

## 2017-01-17 ENCOUNTER — Ambulatory Visit (INDEPENDENT_AMBULATORY_CARE_PROVIDER_SITE_OTHER): Payer: BLUE CROSS/BLUE SHIELD | Admitting: *Deleted

## 2017-01-17 DIAGNOSIS — E538 Deficiency of other specified B group vitamins: Secondary | ICD-10-CM | POA: Diagnosis not present

## 2017-01-17 MED ORDER — FLUTICASONE PROPIONATE 50 MCG/ACT NA SUSP: NASAL | 1 refills | Status: DC

## 2017-01-17 NOTE — Progress Notes (Signed)
Pt given vit B12 inj Tolerated well 

## 2017-02-16 ENCOUNTER — Encounter: Payer: Self-pay | Admitting: Nurse Practitioner

## 2017-02-16 ENCOUNTER — Ambulatory Visit (INDEPENDENT_AMBULATORY_CARE_PROVIDER_SITE_OTHER): Payer: BLUE CROSS/BLUE SHIELD | Admitting: Nurse Practitioner

## 2017-02-16 VITALS — BP 123/72 | HR 68 | Temp 97.2°F | Ht 67.0 in | Wt 260.0 lb

## 2017-02-16 DIAGNOSIS — R7989 Other specified abnormal findings of blood chemistry: Secondary | ICD-10-CM | POA: Diagnosis not present

## 2017-02-16 DIAGNOSIS — J309 Allergic rhinitis, unspecified: Secondary | ICD-10-CM

## 2017-02-16 DIAGNOSIS — K219 Gastro-esophageal reflux disease without esophagitis: Secondary | ICD-10-CM | POA: Diagnosis not present

## 2017-02-16 DIAGNOSIS — E538 Deficiency of other specified B group vitamins: Secondary | ICD-10-CM

## 2017-02-16 NOTE — Progress Notes (Signed)
   Subjective:    Patient ID: Renee Thomas, female    DOB: 1970-11-10, 46 y.o.   MRN: [id number]  HPI  Renee Thomas is here today for follow up of chronic medical problem.  Outpatient Encounter Prescriptions as of 02/16/2017  Medication Sig  . Cholecalciferol (VITAMIN D PO) Take by mouth daily.  . fluticasone (FLONASE) 50 MCG/ACT nasal spray Use 1-2 sprays each nostril at bedtime  . KRILL OIL PO Take by mouth.  . levonorgestrel-ethinyl estradiol (SRONYX) 0.1-20 MG-MCG tablet Take 1 tablet by mouth daily.  . Multiple Vitamins-Minerals (MULTIVITAMIN PO) Take by mouth daily.   . naproxen (NAPROSYN) 500 MG tablet Take 1 tablet (500 mg total) by mouth 2 (two) times daily with a meal.  . omeprazole (PRILOSEC) 20 MG capsule TAKE 1 CAPSULE (20 MG TOTAL) BY MOUTH DAILY.  . Probiotic Product (TRUBIOTICS PO) Take by mouth daily.  . valACYclovir (VALTREX) 1000 MG tablet Take 1 tablet (1,000 mg total) by mouth 2 (two) times daily. X 1 day for oral outbreak     1. Low serum vitamin B12  Gets injections monthly  2. Gastroesophageal reflux disease without esophagitis  Omeprazole works well to keep symptoms under control  3. Chronic allergic rhinitis, unspecified seasonality, unspecified trigger  Uses flonase daily    New complaints: No new complaints     Review of Systems  Constitutional: Negative for diaphoresis.  Eyes: Negative for pain.  Respiratory: Negative for shortness of breath.   Cardiovascular: Negative for chest pain, palpitations and leg swelling.  Gastrointestinal: Negative for abdominal pain.  Endocrine: Negative for polydipsia.  Skin: Negative for rash.  Neurological: Negative for dizziness, weakness and headaches.  Hematological: Does not bruise/bleed easily.       Objective:   Physical Exam  Constitutional: She is oriented to person, place, and time. She appears well-developed and well-nourished.  HENT:  Nose: Nose normal.  Mouth/Throat: Oropharynx is clear and  moist.  Eyes: EOM are normal.  Neck: Trachea normal, normal range of motion and full passive range of motion without pain. Neck supple. No JVD present. Carotid bruit is not present. No thyromegaly present.  Cardiovascular: Normal rate, regular rhythm, normal heart sounds and intact distal pulses.  Exam reveals no gallop and no friction rub.   No murmur heard. Pulmonary/Chest: Effort normal and breath sounds normal.  Abdominal: Soft. Bowel sounds are normal. She exhibits no distension and no mass. There is no tenderness.  Musculoskeletal: Normal range of motion.  Lymphadenopathy:    She has no cervical adenopathy.  Neurological: She is alert and oriented to person, place, and time. She has normal reflexes.  Skin: Skin is warm and dry.  Psychiatric: She has a normal mood and affect. Her behavior is normal. Judgment and thought content normal.    BP 123/72   Pulse 68   Temp 97.2 F (36.2 C) (Oral)   Ht _0  (1.702 m)   Wt 260 lb (117.9 kg)   BMI 40.72 kg/m        Assessment & Plan:  1. Low serum vitamin B12 Continue monthly b12 - CMP14+EGFR - Vitamin B12  2. Gastroesophageal reflux disease without esophagitis Avoid spicy foods Do not eat 2 hours prior to bedtime   3. Chronic allergic rhinitis, unspecified seasonality, unspecified trigger Continue flonase    Labs pending Health maintenance reviewed Diet and exercise encouraged Continue all meds Follow up  In 6 month   [place], FNP

## 2017-02-17 LAB — CMP14+EGFR
ALT: 12 IU/L (ref 0–32)
AST: 14 IU/L (ref 0–40)
Albumin/Globulin Ratio: 1.4 (ref 1.2–2.2)
Albumin: 4.2 g/dL (ref 3.5–5.5)
Alkaline Phosphatase: 73 IU/L (ref 39–117)
BUN/Creatinine Ratio: 25 — ABNORMAL HIGH (ref 9–23)
BUN: 17 mg/dL (ref 6–24)
Bilirubin Total: 0.7 mg/dL (ref 0.0–1.2)
CO2: 22 mmol/L (ref 18–29)
Calcium: 9.4 mg/dL (ref 8.7–10.2)
Chloride: 102 mmol/L (ref 96–106)
Creatinine, Ser: 0.69 mg/dL (ref 0.57–1.00)
GFR calc Af Amer: 119 mL/min/{1.73_m2} (ref >59)
GFR calc non Af Amer: 103 mL/min/{1.73_m2} (ref >59)
Globulin, Total: 3 g/dL (ref 1.5–4.5)
Glucose: 74 mg/dL (ref 65–99)
Potassium: 4.5 mmol/L (ref 3.5–5.2)
Sodium: 138 mmol/L (ref 134–144)
Total Protein: 7.2 g/dL (ref 6.0–8.5)

## 2017-02-17 LAB — VITAMIN B12: Vitamin B-12: 451 pg/mL (ref 232–1245)

## 2017-02-19 ENCOUNTER — Ambulatory Visit: Payer: BLUE CROSS/BLUE SHIELD

## 2017-02-21 ENCOUNTER — Ambulatory Visit (INDEPENDENT_AMBULATORY_CARE_PROVIDER_SITE_OTHER): Payer: BLUE CROSS/BLUE SHIELD | Admitting: *Deleted

## 2017-02-21 DIAGNOSIS — E538 Deficiency of other specified B group vitamins: Secondary | ICD-10-CM

## 2017-02-21 NOTE — Progress Notes (Signed)
Pt given vit B12 inj Tolerated well 

## 2017-03-26 ENCOUNTER — Ambulatory Visit: Payer: BLUE CROSS/BLUE SHIELD

## 2017-03-27 ENCOUNTER — Ambulatory Visit (INDEPENDENT_AMBULATORY_CARE_PROVIDER_SITE_OTHER): Payer: BLUE CROSS/BLUE SHIELD | Admitting: *Deleted

## 2017-03-27 DIAGNOSIS — E538 Deficiency of other specified B group vitamins: Secondary | ICD-10-CM | POA: Diagnosis not present

## 2017-03-27 NOTE — Progress Notes (Signed)
Pt given Vit B12 inj Tolerated well 

## 2017-04-24 ENCOUNTER — Emergency Department (HOSPITAL_COMMUNITY)
Admission: EM | Admit: 2017-04-24 | Discharge: 2017-04-24 | Disposition: A | Discharge status: Home / Self Care | Payer: BLUE CROSS/BLUE SHIELD | Attending: Emergency Medicine | Admitting: Emergency Medicine

## 2017-04-24 ENCOUNTER — Encounter (HOSPITAL_COMMUNITY): Payer: Self-pay | Admitting: *Deleted

## 2017-04-24 DIAGNOSIS — S46812A Strain of other muscles, fascia and tendons at shoulder and upper arm level, left arm, initial encounter: Secondary | ICD-10-CM | POA: Insufficient documentation

## 2017-04-24 DIAGNOSIS — Y998 Other external cause status: Secondary | ICD-10-CM | POA: Insufficient documentation

## 2017-04-24 DIAGNOSIS — Y939 Activity, unspecified: Secondary | ICD-10-CM | POA: Insufficient documentation

## 2017-04-24 DIAGNOSIS — Z79899 Other long term (current) drug therapy: Secondary | ICD-10-CM | POA: Diagnosis not present

## 2017-04-24 DIAGNOSIS — Y92410 Unspecified street and highway as the place of occurrence of the external cause: Secondary | ICD-10-CM | POA: Insufficient documentation

## 2017-04-24 DIAGNOSIS — S4992XA Unspecified injury of left shoulder and upper arm, initial encounter: Secondary | ICD-10-CM | POA: Diagnosis present

## 2017-04-24 MED ORDER — METHOCARBAMOL 500 MG PO TABS: 500.0000 mg | ORAL_TABLET | Freq: Two times a day (BID) | ORAL | 0 refills | Status: DC | PRN

## 2017-04-24 MED ORDER — NAPROXEN 500 MG PO TABS: 500.0000 mg | ORAL_TABLET | Freq: Two times a day (BID) | ORAL | 0 refills | Status: DC

## 2017-04-24 MED ORDER — ACETAMINOPHEN 325 MG PO TABS
650.0000 mg | ORAL_TABLET | Freq: Once | ORAL | Status: AC
  Administered 2017-04-24: 650 mg via ORAL
  Filled 2017-04-24: qty 2
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
## 2017-05-01 ENCOUNTER — Ambulatory Visit: Payer: BLUE CROSS/BLUE SHIELD | Admitting: Nurse Practitioner

## 2017-05-02 ENCOUNTER — Encounter: Payer: Self-pay | Admitting: Family

## 2017-05-02 ENCOUNTER — Ambulatory Visit (INDEPENDENT_AMBULATORY_CARE_PROVIDER_SITE_OTHER): Payer: BLUE CROSS/BLUE SHIELD | Admitting: Family

## 2017-05-02 ENCOUNTER — Ambulatory Visit: Payer: BLUE CROSS/BLUE SHIELD

## 2017-05-02 DIAGNOSIS — E538 Deficiency of other specified B group vitamins: Secondary | ICD-10-CM | POA: Diagnosis not present

## 2017-05-02 DIAGNOSIS — Z09 Encounter for follow-up examination after completed treatment for conditions other than malignant neoplasm: Secondary | ICD-10-CM | POA: Diagnosis not present

## 2017-05-02 DIAGNOSIS — M25511 Pain in right shoulder: Secondary | ICD-10-CM | POA: Diagnosis not present

## 2017-05-02 NOTE — Patient Instructions (Signed)
Shoulder Pain Many things can cause shoulder pain, including:  An injury to the area.  Overuse of the shoulder.  Arthritis. The source of the pain can be:  Inflammation.  An injury to the shoulder joint.  An injury to a tendon, ligament, or bone. Follow these instructions at home: Take these actions to help with your pain:  Squeeze a soft ball or a foam pad as much as possible. This helps to keep the shoulder from swelling. It also helps to strengthen the arm.  Take over-the-counter and prescription medicines only as told by your health care provider.  If directed, apply ice to the area:  Put ice in a plastic bag.  Place a towel between your skin and the bag.  Leave the ice on for 20 minutes, 2-3 times per day. Stop applying ice if it does not help with the pain.  If you were given a shoulder sling or immobilizer:  Wear it as told.  Remove it to shower or bathe.  Move your arm as little as possible, but keep your hand moving to prevent swelling. Contact a health care provider if:  Your pain gets worse.  Your pain is not relieved with medicines.  New pain develops in your arm, hand, or fingers. Get help right away if:  Your arm, hand, or fingers:  Tingle.  Become numb.  Become swollen.  Become painful.  Turn white or blue. This information is not intended to replace advice given to you by your health care provider. Make sure you discuss any questions you have with your health care provider. Document Released: 08/02/2005 Document Revised: 06/18/2016 Document Reviewed: 02/15/2015 Elsevier Interactive Patient Education  2017 [employer]  
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
## 2017-05-15 ENCOUNTER — Other Ambulatory Visit: Payer: Self-pay | Admitting: Nurse Practitioner

## 2017-06-06 ENCOUNTER — Ambulatory Visit (INDEPENDENT_AMBULATORY_CARE_PROVIDER_SITE_OTHER): Payer: BLUE CROSS/BLUE SHIELD | Admitting: *Deleted

## 2017-06-06 DIAGNOSIS — E538 Deficiency of other specified B group vitamins: Secondary | ICD-10-CM

## 2017-06-06 NOTE — Progress Notes (Signed)
Pt given Vit B12 inj Tolerated well 

## 2017-07-04 ENCOUNTER — Ambulatory Visit (INDEPENDENT_AMBULATORY_CARE_PROVIDER_SITE_OTHER): Payer: BLUE CROSS/BLUE SHIELD | Admitting: *Deleted

## 2017-07-04 DIAGNOSIS — E538 Deficiency of other specified B group vitamins: Secondary | ICD-10-CM

## 2017-07-04 NOTE — Progress Notes (Signed)
Pt given Cyanocobalalmin inj Tolerated well 

## 2017-08-08 ENCOUNTER — Ambulatory Visit (INDEPENDENT_AMBULATORY_CARE_PROVIDER_SITE_OTHER): Payer: BLUE CROSS/BLUE SHIELD | Admitting: *Deleted

## 2017-08-08 DIAGNOSIS — E538 Deficiency of other specified B group vitamins: Secondary | ICD-10-CM | POA: Diagnosis not present

## 2017-08-08 NOTE — Progress Notes (Signed)
Pt given Cyanocobalamin inj Tolerated well 

## 2017-08-12 ENCOUNTER — Emergency Department (HOSPITAL_COMMUNITY)
Admission: EM | Admit: 2017-08-12 | Discharge: 2017-08-12 | Disposition: A | Discharge status: Home / Self Care | Payer: BLUE CROSS/BLUE SHIELD | Attending: Emergency Medicine | Admitting: Emergency Medicine

## 2017-08-12 ENCOUNTER — Emergency Department (HOSPITAL_COMMUNITY): Payer: BLUE CROSS/BLUE SHIELD

## 2017-08-12 ENCOUNTER — Encounter (HOSPITAL_COMMUNITY): Payer: Self-pay | Admitting: Emergency Medicine

## 2017-08-12 DIAGNOSIS — Z79899 Other long term (current) drug therapy: Secondary | ICD-10-CM | POA: Diagnosis not present

## 2017-08-12 DIAGNOSIS — R42 Dizziness and giddiness: Secondary | ICD-10-CM | POA: Diagnosis not present

## 2017-08-12 LAB — BASIC METABOLIC PANEL
Anion gap: 9 (ref 5–15)
BUN: 17 mg/dL (ref 6–20)
CO2: 23 mmol/L (ref 22–32)
Calcium: 9.2 mg/dL (ref 8.9–10.3)
Chloride: 105 mmol/L (ref 101–111)
Creatinine, Ser: 0.87 mg/dL (ref 0.44–1.00)
GFR calc Af Amer: >60 mL/min (ref >60)
GFR calc non Af Amer: >60 mL/min (ref >60)
Glucose, Bld: 98 mg/dL (ref 65–99)
Potassium: 3.9 mmol/L (ref 3.5–5.1)
Sodium: 137 mmol/L (ref 135–145)

## 2017-08-12 LAB — CBC WITH DIFFERENTIAL/PLATELET
Basophils Absolute: 0 10*3/uL (ref 0.0–0.1)
Basophils Relative: 1 %
Eosinophils Absolute: 0.1 10*3/uL (ref 0.0–0.7)
Eosinophils Relative: 2 %
HCT: 38.3 % (ref 36.0–46.0)
Hemoglobin: 12.6 g/dL (ref 12.0–15.0)
Lymphocytes Relative: 33 %
Lymphs Abs: 1.6 10*3/uL (ref 0.7–4.0)
MCH: 30 pg (ref 26.0–34.0)
MCHC: 32.9 g/dL (ref 30.0–36.0)
MCV: 91.2 fL (ref 78.0–100.0)
Monocytes Absolute: 0.3 10*3/uL (ref 0.1–1.0)
Monocytes Relative: 5 %
Neutro Abs: 3 10*3/uL (ref 1.7–7.7)
Neutrophils Relative %: 59 %
Platelets: 286 10*3/uL (ref 150–400)
RBC: 4.2 MIL/uL (ref 3.87–5.11)
RDW: 14 % (ref 11.5–15.5)
WBC: 5 10*3/uL (ref 4.0–10.5)

## 2017-08-12 LAB — MAGNESIUM: Magnesium: 2 mg/dL (ref 1.7–2.4)

## 2017-08-12 LAB — TROPONIN I: Troponin I: <0.03 ng/mL (ref <0.03)

## 2017-08-12 NOTE — ED Notes (Signed)
Cup of water given.

## 2017-08-12 NOTE — ED Triage Notes (Signed)
Pt states that she got lightheaded and has no energy that started this evening.  Pt states that this normally happens when her B12 is low, but she just received her injection.  Pt denies fever, chills, n/v, and diarrhea.  Pt states that she is experiencing some head pressure.  Pts heart rate is irregular and fluctuating from the 40s to 80s.

## 2017-08-12 NOTE — Discharge Instructions (Signed)
You were seen in the ED today with intermittent lightheadedness. Your lab work and x-rays were normal. I would like for you to follow up with both your PCP for re-evaluation and the Cardiology group listed on this discharge paperwork for an office evaluation.   Return to the ED if your symptoms suddenly worsen or you develop chest pain, difficulty breathing, passing out, or other concerning symptoms.

## 2017-08-12 NOTE — ED Provider Notes (Signed)
Emergency Department Provider Note   I have reviewed the triage vital signs and the nursing notes.   HISTORY  Chief Complaint No chief complaint on file.   HPI Renee Thomas is a 46 y.o. female with PMH of anemia and GERD presents to the emergency department for evaluation of lightheadedness with standing this evening. Patient states that she's had similar episodes in the past with B12 deficiency but recently received her B12 injection. Today she was at home watching TV when she stood up he felt lightheaded and needed to sit back down. She says she rested for a short period of time and symptoms resolved. She didn't try to stand up again but had return of lightheadedness. She denies any palpitations, chest pain, shortness of breath during the episodes. No syncope. Since that time she has had no additional lightheadedness or fatigue. No modifying factors. No history of thyroid disease. No issues with bleeding or anemia.    Past Medical History:  Diagnosis Date  . Anemia   . GERD (gastroesophageal reflux disease)   . MVP (mitral valve prolapse)     Patient Active Problem List   Diagnosis Date Noted  . Low serum vitamin B12 11/17/2016    History reviewed. No pertinent surgical history.  Current Outpatient Rx  . Order #: [id number] Class: Historical Med  . Order #: [id number] Class: Historical Med  . Order #: [id number] Class: Historical Med  . Order #: [id number] Class: Normal  . Order #: [id number] Class: Historical Med  . Order #: [id number] Class: Normal  . Order #: [id number] Class: Print  . Order #: [id number] Class: Normal  . Order #: [id number] Class: Historical Med    Allergies Other; Peanut-containing drug products; Eggs or egg-derived products; Amoxicillin; Penicillins; and Sulfa antibiotics  Family History  Problem Relation Age of Onset  . Hypertension Mother   . Diabetes Mother   . Other Mother        renal disease  . Hypertension Father   . Heart attack Father   .  Hypertension Sister   . Diabetes Brother   . Diabetes Brother     Social History Social History  Substance Use Topics  . Smoking status: Never Smoker  . Smokeless tobacco: Never Used  . Alcohol use No    Review of Systems  Constitutional: No fever/chills. Positive lightheadedness on standing.  Eyes: No visual changes. ENT: No sore throat. Cardiovascular: Denies chest pain. Respiratory: Denies shortness of breath. Gastrointestinal: No abdominal pain.  No nausea, no vomiting.  No diarrhea.  No constipation. Genitourinary: Negative for dysuria. Musculoskeletal: Negative for back pain. Skin: Negative for rash. Neurological: Negative for headaches, focal weakness or numbness.  10-point ROS otherwise negative.  ____________________________________________   PHYSICAL EXAM:  VITAL SIGNS: ED Triage Vitals  Enc Vitals Group     BP 08/12/17 1823 (!) 154/116     Pulse Rate 08/12/17 1823 (!) 52     Resp --      Temp 08/12/17 1823 98 F (36.7 C)     Temp Source 08/12/17 1823 Oral     SpO2 08/12/17 1823 100 %     Weight 08/12/17 1826 265 lb (120.2 kg)   Constitutional: Alert and oriented. Well appearing and in no acute distress. Eyes: Conjunctivae are normal. Head: Atraumatic. Nose: No congestion/rhinnorhea. Mouth/Throat: Mucous membranes are moist.   Neck: No stridor. Cardiovascular: Normal rate, regular rhythm. Good peripheral circulation. Grossly normal heart sounds.   Respiratory: Normal respiratory effort.  No retractions. Lungs CTAB. Gastrointestinal: Soft and nontender. No distention.  Musculoskeletal: No lower extremity tenderness nor edema. No gross deformities of extremities. Neurologic:  Normal speech and language. No gross focal neurologic deficits are appreciated.  Skin:  Skin is warm, dry and intact. No rash noted.  ____________________________________________   LABS (all labs ordered are listed, but only abnormal results are displayed)  Labs Reviewed    BASIC METABOLIC PANEL  CBC WITH DIFFERENTIAL/PLATELET  TROPONIN I  MAGNESIUM   ____________________________________________  EKG   EKG Interpretation  Date/Time:  Sunday August 12 2017 18:29:56 EDT Ventricular Rate:  79 PR Interval:  150 QRS Duration: 82 QT Interval:  356 QTC Calculation: 408 R Axis:   -8 Text Interpretation:  Sinus rhythm with frequent Premature ventricular complexes Possible Left atrial enlargement Left ventricular hypertrophy Abnormal ECG No STEMI.  Confirmed by Alona Bene [phone]) on 08/12/2017 7:53:23 PM       ____________________________________________  RADIOLOGY  Dg Chest 2 View  Result Date: 08/12/2017 CLINICAL DATA:  Dizziness today EXAM: CHEST  2 VIEW COMPARISON:  08/02/2003 report FINDINGS: The heart size and mediastinal contours are within normal limits. No pulmonary consolidation, effusion or pneumothorax. No overt pulmonary edema. The visualized skeletal structures are unremarkable. IMPRESSION: No active cardiopulmonary disease. Electronically Signed   By: Tollie Eth M.D.   On: 08/12/2017 19:16    ____________________________________________   PROCEDURES  Procedure(s) performed:   Procedures  None ____________________________________________   INITIAL IMPRESSION / ASSESSMENT AND PLAN / ED COURSE  Pertinent labs & imaging results that were available during my care of the patient were reviewed by me and considered in my medical decision making (see chart for details).  Patient presents to the emergency department for evaluation of lightheadedness on standing twice this evening. Symptoms seem to have resolved. Her EKG is significant for frequent PVCs. Blood pressure is normal to slightly elevated on arrival. Afebrile. No new medications. Plan for labs, orthostatic vital signs, and reassessment. Discussed the patient would benefit from outpatient primary care physician follow-up and cardiology evaluation with consideration for  outpatient echo and outpatient telemetry monitoring if w/u is negative today.   Patient labs are unremarkable. Plan for PCP and outpatient Cardiology follow up.   At this time, I do not feel there is any life-threatening condition present. I have reviewed and discussed all results (EKG, imaging, lab, urine as appropriate), exam findings with patient. I have reviewed nursing notes and appropriate previous records.  I feel the patient is safe to be discharged home without further emergent workup. Discussed usual and customary return precautions. Patient and family (if present) verbalize understanding and are comfortable with this plan.  Patient will follow-up with their primary care provider. If they do not have a primary care provider, information for follow-up has been provided to them. All questions have been answered.  ____________________________________________  FINAL CLINICAL IMPRESSION(S) / ED DIAGNOSES  Final diagnoses:  Episodic lightheadedness     MEDICATIONS GIVEN DURING THIS VISIT:  Medications - No data to display   NEW OUTPATIENT MEDICATIONS STARTED DURING THIS VISIT:  None   Note:  This document was prepared using Dragon voice recognition software and may include unintentional dictation errors.  Alona Bene, MD Emergency Medicine   Long, Arlyss Repress, MD 08/12/17 2202

## 2017-08-17 ENCOUNTER — Ambulatory Visit: Payer: BLUE CROSS/BLUE SHIELD | Admitting: Nurse Practitioner

## 2017-08-20 ENCOUNTER — Ambulatory Visit (INDEPENDENT_AMBULATORY_CARE_PROVIDER_SITE_OTHER): Payer: BLUE CROSS/BLUE SHIELD | Admitting: Nurse Practitioner

## 2017-08-20 ENCOUNTER — Encounter: Payer: Self-pay | Admitting: Nurse Practitioner

## 2017-08-20 VITALS — BP 136/76 | HR 57 | Temp 97.1°F | Ht 65.0 in | Wt 270.0 lb

## 2017-08-20 DIAGNOSIS — Z09 Encounter for follow-up examination after completed treatment for conditions other than malignant neoplasm: Secondary | ICD-10-CM

## 2017-08-20 DIAGNOSIS — R42 Dizziness and giddiness: Secondary | ICD-10-CM

## 2017-08-20 NOTE — Progress Notes (Signed)
   Subjective:    Patient ID: Renee Thomas, female    DOB: September 02, 1971, 46 y.o.   MRN: [id number]  HPI Patient comes in today for hospital follow up. SHe went to the ER on 08/12/17 with c/o lightheadedness. She denied passing out but just felt lightheaded. All labs, chest xray and EKG were normal. Discharged with instructions to follow up with PCP. They suggested to check thyroid. SHe did have some PVC on EKG and they referred her to cardiology for evaluation, appointment is Aug 28, 2017. NO more episodes since going to ER.    Review of Systems  Constitutional: Negative.   HENT: Negative.   Respiratory: Negative.   Cardiovascular: Negative.   Gastrointestinal: Negative.   Genitourinary: Negative.   Neurological: Negative.   Psychiatric/Behavioral: Negative.   All other systems reviewed and are negative.      Objective:   Physical Exam  Constitutional: She is oriented to person, place, and time. She appears well-developed and well-nourished.  HENT:  Right Ear: Hearing, external ear and ear canal normal. A middle ear effusion (mild clear) is present.  Left Ear: Hearing, external ear and ear canal normal. A middle ear effusion (mild clear) is present.  Nose: Nose normal.  Mouth/Throat: Uvula is midline and oropharynx is clear and moist.  Eyes: Pupils are equal, round, and reactive to light.  Neck: Normal range of motion.  Cardiovascular: Normal rate and normal heart sounds.   Occasion extra beats- probable PVC's  Pulmonary/Chest: Effort normal and breath sounds normal.  Abdominal: Soft. Bowel sounds are normal.  Neurological: She is alert and oriented to person, place, and time.  Skin: Skin is warm.  Psychiatric: She has a normal mood and affect. Her behavior is normal. Judgment and thought content normal.    BP 136/76   Pulse (!) 57   Temp (!) 97.1 F (36.2 C) (Oral)   Ht  (1.651 m)   Wt 270 lb (122.5 kg)   LMP 07/21/2017 (Approximate)   BMI 44.93 kg/m          Assessment & Plan:  1. Hospital discharge follow-up Hospital records reviewed  2. Intermittent lightheadedness Labs pending Keep appointment with cardiology - Thyroid Panel With TSH RTO prn  [name] Daphine Deutscher, FNP

## 2017-08-20 NOTE — Patient Instructions (Signed)

## 2017-08-21 LAB — THYROID PANEL WITH TSH
Free Thyroxine Index: 1.7 (ref 1.2–4.9)
T3 Uptake Ratio: 17 % — ABNORMAL LOW (ref 24–39)
T4, Total: 9.8 ug/dL (ref 4.5–12.0)
TSH: 1.67 u[IU]/mL (ref 0.450–4.500)

## 2017-08-27 ENCOUNTER — Encounter: Payer: Self-pay | Admitting: Cardiology

## 2017-08-27 ENCOUNTER — Ambulatory Visit (INDEPENDENT_AMBULATORY_CARE_PROVIDER_SITE_OTHER): Payer: BLUE CROSS/BLUE SHIELD | Admitting: Cardiology

## 2017-08-27 VITALS — BP 116/84 | HR 70 | Ht 65.0 in | Wt 267.0 lb

## 2017-08-27 DIAGNOSIS — I493 Ventricular premature depolarization: Secondary | ICD-10-CM | POA: Diagnosis not present

## 2017-08-27 NOTE — Progress Notes (Signed)
Clinical Summary Ms. Kuan is a 46 y.o.female seen as a new consult, referred by PA Daphine Deutscher for dizziness and PVCs  1. Dizziness - recent ER visit with dizziness. Overall negative workup. Orthostatics were negative - no recurrent episodes.   2. PVCs - occasional palpitaitons, mainly with laying down to go to sleep. Fairly mild. Can occur with stress - no coffee, occasional tea, 1 soda a nigh MTN Dew, no EtoH - normal K, Mg, normal TSH - over the last month can have some postional back and chest pain. Heaviest activity is moving heavy boxes at work with some mild fatigue.  - previous monitor in 2003/2004.      Past Medical History:  Diagnosis Date  . Anemia   . GERD (gastroesophageal reflux disease)   . MVP (mitral valve prolapse)      Allergies  Allergen Reactions  . Other Anaphylaxis    All Tree-Nuts  . Peanut-Containing Drug Products Anaphylaxis  . Eggs Or Egg-Derived Products     Swells mouth with itching  . Amoxicillin Rash  . Penicillins Rash    .Has patient had a PCN reaction causing immediate rash, facial/tongue/throat swelling, SOB or lightheadedness with hypotension: Yes Has patient had a PCN reaction causing severe rash involving mucus membranes or skin necrosis: No Has patient had a PCN reaction that required hospitalization: no Has patient had a PCN reaction occurring within the last 10 years: no If all of the above answers are "NO", then may proceed with Cephalosporin use.   . Sulfa Antibiotics Rash    numbness     Current Outpatient Prescriptions  Medication Sig Dispense Refill  . Carboxymethylcellul-Glycerin (CLEAR EYES FOR DRY EYES OP) Place 1-2 drops into both eyes daily as needed.    . Cholecalciferol (VITAMIN D PO) Take by mouth daily.    . cyanocobalamin (,VITAMIN B-12,) 1000 MCG/ML injection Inject 1,000 mcg into the muscle every 30 (thirty) days.    . fluticasone (FLONASE) 50 MCG/ACT nasal spray Use 1-2 sprays each nostril at bedtime 48 g 1   . KRILL OIL PO Take by mouth.    . levonorgestrel-ethinyl estradiol (SRONYX) 0.1-20 MG-MCG tablet Take 1 tablet by mouth daily. 28 tablet 12  . naproxen (NAPROSYN) 500 MG tablet Take 1 tablet (500 mg total) by mouth 2 (two) times daily with a meal. 30 tablet 0  . omeprazole (PRILOSEC) 20 MG capsule TAKE 1 CAPSULE (20 MG TOTAL) BY MOUTH DAILY. 90 capsule 1  . Probiotic Product (TRUBIOTICS PO) Take by mouth daily.     Current Facility-Administered Medications  Medication Dose Route Frequency Provider Last Rate Last Dose  . cyanocobalamin ((VITAMIN B-12)) injection 1,000 mcg  1,000 mcg Intramuscular Weekly Daphine Deutscher, [name], FNP   1,000 mcg at 08/08/17 0859     No past surgical history on file.   Allergies  Allergen Reactions  . Other Anaphylaxis    All Tree-Nuts  . Peanut-Containing Drug Products Anaphylaxis  . Eggs Or Egg-Derived Products     Swells mouth with itching  . Amoxicillin Rash  . Penicillins Rash    .Has patient had a PCN reaction causing immediate rash, facial/tongue/throat swelling, SOB or lightheadedness with hypotension: Yes Has patient had a PCN reaction causing severe rash involving mucus membranes or skin necrosis: No Has patient had a PCN reaction that required hospitalization: no Has patient had a PCN reaction occurring within the last 10 years: no If all of the above answers are "NO", then may proceed with Cephalosporin  use.   . Sulfa Antibiotics Rash    numbness      Family History  Problem Relation Age of Onset  . Hypertension Mother   . Diabetes Mother   . Other Mother        renal disease  . Hypertension Father   . Heart attack Father   . Hypertension Sister   . Diabetes Brother   . Diabetes Brother      Social History Ms. Rana SnareLowe reports that she has never smoked. She has never used smokeless tobacco. Ms. Rana SnareLowe reports that she does not drink alcohol.   Review of Systems CONSTITUTIONAL: No weight loss, fever, chills, weakness or  fatigue.  HEENT: Eyes: No visual loss, blurred vision, double vision or yellow sclerae.No hearing loss, sneezing, congestion, runny nose or sore throat.  SKIN: No rash or itching.  CARDIOVASCULAR: per hpi RESPIRATORY: No shortness of breath, cough or sputum.  GASTROINTESTINAL: No anorexia, nausea, vomiting or diarrhea. No abdominal pain or blood.  GENITOURINARY: No burning on urination, no polyuria NEUROLOGICAL: No headache, dizziness, syncope, paralysis, ataxia, numbness or tingling in the extremities. No change in bowel or bladder control.  MUSCULOSKELETAL: No muscle, back pain, joint pain or stiffness.  LYMPHATICS: No enlarged nodes. No history of splenectomy.  PSYCHIATRIC: No history of depression or anxiety.  ENDOCRINOLOGIC: No reports of sweating, cold or heat intolerance. No polyuria or polydipsia.  Marland Kitchen.   Physical Examination Vitals:   08/27/17 0857  BP: 116/84  Pulse: 70  SpO2: 99%   Vitals:   08/27/17 0857  Weight: 267 lb (121.1 kg)  Height: 5\' 5"  (1.651 m)    Gen: resting comfortably, no acute distress HEENT: no scleral icterus, pupils equal round and reactive, no palptable cervical adenopathy,  CV: RRR, no m/r/g, no jvd Resp: Clear to auscultation bilaterally GI: abdomen is soft, non-tender, non-distended, normal bowel sounds, no hepatosplenomegaly MSK: extremities are warm, no edema.  Skin: warm, no rash Neuro:  no focal deficits Psych: appropriate affect   Diagnostic Studies     Assessment and Plan  1. PVCs - noted on recent EKG, mild palpitations at times - we will obtain a 24 hr holter to quantify her PVC burden and evalaute for any ventricular arrhythmias - pending holter results, consider workup for structural heart disease with possible echo and/or stress test.        Antoine PocheJonathan F. Branch, M.D.

## 2017-08-27 NOTE — Patient Instructions (Signed)
Medication Instructions:  Your physician recommends that you continue on your current medications as directed. Please refer to the Current Medication list given to you today.   Labwork: NONE   Testing/Procedures: Your physician has recommended that you wear a holter monitor. Holter monitors are medical devices that record the heart's electrical activity. Doctors most often use these monitors to diagnose arrhythmias. Arrhythmias are problems with the speed or rhythm of the heartbeat. The monitor is a small, portable device. You can wear one while you do your normal daily activities. This is usually used to diagnose what is causing palpitations/syncope (passing out).    Follow-Up: Your physician recommends that you schedule a follow-up appointment pending test results.   Any Other Special Instructions Will Be Listed Below (If Applicable).     If you need a refill on your cardiac medications before your next appointment, please call your pharmacy. Thank you for choosing [facility]!

## 2017-08-28 ENCOUNTER — Ambulatory Visit (HOSPITAL_COMMUNITY)
Admission: RE | Admit: 2017-08-28 | Discharge: 2017-08-28 | Disposition: A | Discharge status: Home / Self Care | Payer: BLUE CROSS/BLUE SHIELD | Source: Ambulatory Visit | Attending: Cardiology | Admitting: Cardiology

## 2017-08-28 DIAGNOSIS — I493 Ventricular premature depolarization: Secondary | ICD-10-CM | POA: Diagnosis not present

## 2017-08-29 ENCOUNTER — Encounter: Payer: Self-pay | Admitting: Certified Nurse Midwife

## 2017-08-29 ENCOUNTER — Ambulatory Visit (INDEPENDENT_AMBULATORY_CARE_PROVIDER_SITE_OTHER): Payer: BLUE CROSS/BLUE SHIELD | Admitting: Certified Nurse Midwife

## 2017-08-29 VITALS — BP 112/70 | HR 88 | Resp 16 | Ht 65.25 in | Wt 268.0 lb

## 2017-08-29 DIAGNOSIS — Z01419 Encounter for gynecological examination (general) (routine) without abnormal findings: Secondary | ICD-10-CM | POA: Diagnosis not present

## 2017-08-29 DIAGNOSIS — Z8742 Personal history of other diseases of the female genital tract: Secondary | ICD-10-CM | POA: Diagnosis not present
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
## 2017-08-29 NOTE — Patient Instructions (Signed)
EXERCISE AND DIET:  We recommended that you start or continue a regular exercise program for good health. Regular exercise means any activity that makes your heart beat faster and makes you sweat.  We recommend exercising at least 30 minutes per day at least 3 days a week, preferably 4 or 5.  We also recommend a diet low in fat and sugar.  Inactivity, poor dietary choices and obesity can cause diabetes, heart attack, stroke, and kidney damage, among others.    ALCOHOL AND SMOKING:  Women should limit their alcohol intake to no more than 7 drinks/beers/glasses of wine (combined, not each!) per week. Moderation of alcohol intake to this level decreases your risk of breast cancer and liver damage. And of course, no recreational drugs are part of a healthy lifestyle.  And absolutely no smoking or even second hand smoke. Most people know smoking can cause heart and lung diseases, but did you know it also contributes to weakening of your bones? Aging of your skin?  Yellowing of your teeth and nails?  CALCIUM AND VITAMIN D:  Adequate intake of calcium and Vitamin D are recommended.  The recommendations for exact amounts of these supplements seem to change often, but generally speaking 600 mg of calcium (either carbonate or citrate) and 800 units of Vitamin D per day seems prudent. Certain women may benefit from higher intake of Vitamin D.  If you are among these women, your doctor will have told you during your visit.    PAP SMEARS:  Pap smears, to check for cervical cancer or precancers,  have traditionally been done yearly, although recent scientific advances have shown that most women can have pap smears less often.  However, every woman still should have a physical exam from her gynecologist every year. It will include a breast check, inspection of the vulva and vagina to check for abnormal growths or skin changes, a visual exam of the cervix, and then an exam to evaluate the size and shape of the uterus and  ovaries.  And after 48 years of age, a rectal exam is indicated to check for rectal cancers. We will also provide age appropriate advice regarding health maintenance, like when you should have certain vaccines, screening for sexually transmitted diseases, bone density testing, colonoscopy, mammograms, etc.   MAMMOGRAMS:  All women over 40 years old should have a yearly mammogram. Many facilities now offer a "3D" mammogram, which may cost around $50 extra out of pocket. If possible,  we recommend you accept the option to have the 3D mammogram performed.  It both reduces the number of women who will be called back for extra views which then turn out to be normal, and it is better than the routine mammogram at detecting truly abnormal areas.    COLONOSCOPY:  Colonoscopy to screen for colon cancer is recommended for all women at age 50.  We know, you hate the idea of the prep.  We agree, BUT, having colon cancer and not knowing it is worse!!  Colon cancer so often starts as a polyp that can be seen and removed at colonscopy, which can quite literally save your life!  And if your first colonoscopy is normal and you have no family history of colon cancer, most women don't have to have it again for 10 years.  Once every ten years, you can do something that may end up saving your life, right?  We will be happy to help you get it scheduled when you are ready.    Be sure to check your insurance coverage so you understand how much it will cost.  It may be covered as a preventative service at no cost, but you should check your particular policy.      Calorie Counting for Weight Loss Calories are units of energy. Your body needs a certain amount of calories from food to keep you going throughout the day. When you eat more calories than your body needs, your body stores the extra calories as fat. When you eat fewer calories than your body needs, your body burns fat to get the energy it needs. Calorie counting means  keeping track of how many calories you eat and drink each day. Calorie counting can be helpful if you need to lose weight. If you make sure to eat fewer calories than your body needs, you should lose weight. Ask your health care provider what a healthy weight is for you. For calorie counting to work, you will need to eat the right number of calories in a day in order to lose a healthy amount of weight per week. A dietitian can help you determine how many calories you need in a day and will give you suggestions on how to reach your calorie goal.  A healthy amount of weight to lose per week is usually 1-2 lb (0.5-0.9 kg). This usually means that your daily calorie intake should be reduced by 500-750 calories.  Eating 1,200 - 1,500 calories per day can help most women lose weight.  Eating 1,500 - 1,800 calories per day can help most men lose weight.  What is my plan? My goal is to have __________ calories per day. If I have this many calories per day, I should lose around __________ pounds per week. What do I need to know about calorie counting? In order to meet your daily calorie goal, you will need to:  Find out how many calories are in each food you would like to eat. Try to do this before you eat.  Decide how much of the food you plan to eat.  Write down what you ate and how many calories it had. Doing this is called keeping a food log.  To successfully lose weight, it is important to balance calorie counting with a healthy lifestyle that includes regular activity. Aim for 150 minutes of moderate exercise (such as walking) or 75 minutes of vigorous exercise (such as running) each week. Where do I find calorie information?  The number of calories in a food can be found on a Nutrition Facts label. If a food does not have a Nutrition Facts label, try to look up the calories online or ask your dietitian for help. Remember that calories are listed per serving. If you choose to have more than one  serving of a food, you will have to multiply the calories per serving by the amount of servings you plan to eat. For example, the label on a package of bread might say that a serving size is 1 slice and that there are 90 calories in a serving. If you eat 1 slice, you will have eaten 90 calories. If you eat 2 slices, you will have eaten 180 calories. How do I keep a food log? Immediately after each meal, record the following information in your food log:  What you ate. Don't forget to include toppings, sauces, and other extras on the food.  How much you ate. This can be measured in cups, ounces, or number of items.  How  many calories each food and drink had.  The total number of calories in the meal.  Keep your food log near you, such as in a small notebook in your pocket, or use a mobile app or website. Some programs will calculate calories for you and show you how many calories you have left for the day to meet your goal. What are some calorie counting tips?  Use your calories on foods and drinks that will fill you up and not leave you hungry: ? Some examples of foods that fill you up are nuts and nut butters, vegetables, lean proteins, and high-fiber foods like whole grains. High-fiber foods are foods with more than 5 g fiber per serving. ? Drinks such as sodas, specialty coffee drinks, alcohol, and juices have a lot of calories, yet do not fill you up.  Eat nutritious foods and avoid empty calories. Empty calories are calories you get from foods or beverages that do not have many vitamins or protein, such as candy, sweets, and soda. It is better to have a nutritious high-calorie food (such as an avocado) than a food with few nutrients (such as a bag of chips).  Know how many calories are in the foods you eat most often. This will help you calculate calorie counts faster.  Pay attention to calories in drinks. Low-calorie drinks include water and unsweetened drinks.  Pay attention to  nutrition labels for "low fat" or "fat free" foods. These foods sometimes have the same amount of calories or more calories than the full fat versions. They also often have added sugar, starch, or salt, to make up for flavor that was removed with the fat.  Find a way of tracking calories that works for you. Get creative. Try different apps or programs if writing down calories does not work for you. What are some portion control tips?  Know how many calories are in a serving. This will help you know how many servings of a certain food you can have.  Use a measuring cup to measure serving sizes. You could also try weighing out portions on a kitchen scale. With time, you will be able to estimate serving sizes for some foods.  Take some time to put servings of different foods on your favorite plates, bowls, and cups so you know what a serving looks like.  Try not to eat straight from a bag or box. Doing this can lead to overeating. Put the amount you would like to eat in a cup or on a plate to make sure you are eating the right portion.  Use smaller plates, glasses, and bowls to prevent overeating.  Try not to multitask (for example, watch TV or use your computer) while eating. If it is time to eat, sit down at a table and enjoy your food. This will help you to know when you are full. It will also help you to be aware of what you are eating and how much you are eating. What are tips for following this plan? Reading food labels  Check the calorie count compared to the serving size. The serving size may be smaller than what you are used to eating.  Check the source of the calories. Make sure the food you are eating is high in vitamins and protein and low in saturated and trans fats. Shopping  Read nutrition labels while you shop. This will help you make healthy decisions before you decide to purchase your food.  Make a grocery list and stick   to it. Cooking  Try to cook your favorite foods in a  healthier way. For example, try baking instead of frying.  Use low-fat dairy products. Meal planning  Use more fruits and vegetables. Half of your plate should be fruits and vegetables.  Include lean proteins like poultry and fish. How do I count calories when eating out?  Ask for smaller portion sizes.  Consider sharing an entree and sides instead of getting your own entree.  If you get your own entree, eat only half. Ask for a box at the beginning of your meal and put the rest of your entree in it so you are not tempted to eat it.  If calories are listed on the menu, choose the lower calorie options.  Choose dishes that include vegetables, fruits, whole grains, low-fat dairy products, and lean protein.  Choose items that are boiled, broiled, grilled, or steamed. Stay away from items that are buttered, battered, fried, or served with cream sauce. Items labeled "crispy" are usually fried, unless stated otherwise.  Choose water, low-fat milk, unsweetened iced tea, or other drinks without added sugar. If you want an alcoholic beverage, choose a lower calorie option such as a glass of wine or light beer.  Ask for dressings, sauces, and syrups on the side. These are usually high in calories, so you should limit the amount you eat.  If you want a salad, choose a garden salad and ask for grilled meats. Avoid extra toppings like bacon, cheese, or fried items. Ask for the dressing on the side, or ask for olive oil and vinegar or lemon to use as dressing.  Estimate how many servings of a food you are given. For example, a serving of cooked rice is  cup or about the size of half a baseball. Knowing serving sizes will help you be aware of how much food you are eating at restaurants. The list below tells you how big or small some common portion sizes are based on everyday objects: ? 1 oz-4 stacked dice. ? 3 oz-1 deck of cards. ? 1 tsp-1 die. ? 1 Tbsp- a ping-pong ball. ? 2 Tbsp-1 ping-pong  ball. ?  cup- baseball. ? 1 cup-1 baseball. Summary  Calorie counting means keeping track of how many calories you eat and drink each day. If you eat fewer calories than your body needs, you should lose weight.  A healthy amount of weight to lose per week is usually 1-2 lb (0.5-0.9 kg). This usually means reducing your daily calorie intake by 500-750 calories.  The number of calories in a food can be found on a Nutrition Facts label. If a food does not have a Nutrition Facts label, try to look up the calories online or ask your dietitian for help.  Use your calories on foods and drinks that will fill you up, and not on foods and drinks that will leave you hungry.  Use smaller plates, glasses, and bowls to prevent overeating. This information is not intended to replace advice given to you by your health care provider. Make sure you discuss any questions you have with your health care provider. Document Released: 10/23/2005 Document Revised: 09/22/2016 Document Reviewed: 09/22/2016 Elsevier Interactive Patient Education  2017 Elsevier Inc[employer]   Good to see you Have a good year Debbi

## 2017-09-05 ENCOUNTER — Telehealth: Payer: Self-pay

## 2017-09-05 NOTE — Telephone Encounter (Signed)
-----   Message from Jonathan F Branch, MD sent at 09/05/2017  1:01 PM EDT ----- Echo does show fairly frequent extra heart beats called PVCs. Please order an echo to evaluate for any underlying heart weakness as the cause. Order TTE for arrhythmia, PVCs   J Branch MD 

## 2017-09-05 NOTE — Telephone Encounter (Signed)
Called pt., no answer. Left message for pt. To return call.  

## 2017-09-07 ENCOUNTER — Telehealth: Payer: Self-pay

## 2017-09-07 NOTE — Telephone Encounter (Signed)
Called pt., no answer. Left message for pt. To return call.  

## 2017-09-07 NOTE — Telephone Encounter (Signed)
-----   Message from Jonathan F Branch, MD sent at 09/05/2017  1:01 PM EDT ----- Echo does show fairly frequent extra heart beats called PVCs. Please order an echo to evaluate for any underlying heart weakness as the cause. Order TTE for arrhythmia, PVCs   J Branch MD 

## 2017-09-11 ENCOUNTER — Telehealth: Payer: Self-pay

## 2017-09-11 NOTE — Telephone Encounter (Signed)
-----   Message from Jonathan F Branch, MD sent at 09/05/2017  1:01 PM EDT ----- Echo does show fairly frequent extra heart beats called PVCs. Please order an echo to evaluate for any underlying heart weakness as the cause. Order TTE for arrhythmia, PVCs   J Branch MD 

## 2017-09-11 NOTE — Telephone Encounter (Signed)
Called pt., no answer. Left message for pt to return call.  

## 2017-09-12 ENCOUNTER — Ambulatory Visit (INDEPENDENT_AMBULATORY_CARE_PROVIDER_SITE_OTHER): Payer: BLUE CROSS/BLUE SHIELD | Admitting: *Deleted

## 2017-09-12 DIAGNOSIS — E538 Deficiency of other specified B group vitamins: Secondary | ICD-10-CM

## 2017-09-12 NOTE — Progress Notes (Signed)
Pt given Cyanocobalamin inj Tolerated well 

## 2017-09-20 ENCOUNTER — Telehealth: Payer: Self-pay

## 2017-09-20 DIAGNOSIS — I493 Ventricular premature depolarization: Secondary | ICD-10-CM

## 2017-09-20 DIAGNOSIS — I499 Cardiac arrhythmia, unspecified: Secondary | ICD-10-CM

## 2017-09-20 NOTE — Telephone Encounter (Signed)
Spoke with pt. Advised her of test results. She voiced understanding. Order placed for echo.

## 2017-09-20 NOTE — Telephone Encounter (Signed)
-----   Message from Antoine PocheJonathan F Branch, MD sent at 09/05/2017  1:01 PM EDT ----- Echo does show fairly frequent extra heart beats called PVCs. Please order an echo to evaluate for any underlying heart weakness as the cause. Order TTE for arrhythmia, PVCs   Dominga FerryJ Branch MD

## 2017-10-03 ENCOUNTER — Encounter: Payer: Self-pay | Admitting: Physician Assistant

## 2017-10-03 ENCOUNTER — Ambulatory Visit (HOSPITAL_COMMUNITY)
Admission: RE | Admit: 2017-10-03 | Discharge: 2017-10-03 | Disposition: A | Discharge status: Home / Self Care | Payer: BLUE CROSS/BLUE SHIELD | Source: Ambulatory Visit | Attending: Cardiology | Admitting: Cardiology

## 2017-10-03 ENCOUNTER — Ambulatory Visit: Payer: BLUE CROSS/BLUE SHIELD | Admitting: Physician Assistant

## 2017-10-03 VITALS — BP 118/61 | HR 84 | Temp 97.1°F | Ht 62.25 in | Wt 269.0 lb

## 2017-10-03 DIAGNOSIS — I517 Cardiomegaly: Secondary | ICD-10-CM | POA: Insufficient documentation

## 2017-10-03 DIAGNOSIS — I499 Cardiac arrhythmia, unspecified: Secondary | ICD-10-CM | POA: Diagnosis not present

## 2017-10-03 DIAGNOSIS — I493 Ventricular premature depolarization: Secondary | ICD-10-CM

## 2017-10-03 DIAGNOSIS — K219 Gastro-esophageal reflux disease without esophagitis: Secondary | ICD-10-CM | POA: Insufficient documentation

## 2017-10-03 DIAGNOSIS — J0110 Acute frontal sinusitis, unspecified: Secondary | ICD-10-CM | POA: Diagnosis not present

## 2017-10-03 LAB — ECHOCARDIOGRAM COMPLETE
AO mean calculated velocity dopler: 104 cm/s
AV Area VTI index: 1.04 cm2/m2
AV Area VTI: 1.95 cm2
AV Area mean vel: 2.22 cm2
AV Mean grad: 5 mmHg
AV Peak grad: 11 mmHg
AV VEL mean LVOT/AV: 0.64
AV area mean vel ind: 1.02 cm2/m2
AV peak Index: 0.9
AV pk vel: 163 cm/s
AV vel: 2.25
Ao pk vel: 0.56 m/s
E decel time: 275 msec
E/e' ratio: 7.7
FS: 35 % (ref 28–44)
Height: 62.25 in
IVS/LV PW RATIO, ED: 0.97
LA ID, A-P, ES: 46 mm
LA diam end sys: 46 mm
LA diam index: 2.12 cm/m2
LA vol A4C: 87.8 ml
LA vol index: 40.3 mL/m2
LA vol: 87.5 mL
LV E/e' medial: 7.7
LV E/e'average: 7.7
LV PW d: 10.6 mm — AB (ref 0.6–1.1)
LV dias vol index: 48 mL/m2
LV dias vol: 104 mL (ref 46–106)
LV e' LATERAL: 12.4 cm/s
LV sys vol index: 17 mL/m2
LV sys vol: 36 mL (ref 14–42)
LVOT SV: 79 mL
LVOT VTI: 22.8 cm
LVOT area: 3.46 cm2
LVOT diameter: 21 mm
LVOT peak VTI: 0.65 cm
LVOT peak grad rest: 3 mmHg
LVOT peak vel: 92 cm/s
Lateral S' vel: 12.8 cm/s
MV Dec: 275
MV Peak grad: 4 mmHg
MV pk A vel: 87.4 m/s
MV pk E vel: 95.5 m/s
RV sys press: 28 mmHg
Reg peak vel: 251 cm/s
Simpson's disk: 65
Stroke v: 68 ml
TAPSE: 23.9 mm
TDI e' lateral: 12.4
TDI e' medial: 8.27
TR max vel: 251 cm/s
VTI: 35.1 cm
Valve area index: 1.04
Valve area: 2.25 cm2
Weight: 4304 oz

## 2017-10-03 MED ORDER — AZITHROMYCIN 250 MG PO TABS: ORAL_TABLET | ORAL | 0 refills | Status: DC

## 2017-10-03 NOTE — Progress Notes (Signed)
*  PRELIMINARY RESULTS* Echocardiogram 2D Echocardiogram has been performed.  Stacey DrainWhite, Londyn Hotard J 10/03/2017, 1:48 PM

## 2017-10-03 NOTE — Patient Instructions (Signed)

## 2017-10-03 NOTE — Progress Notes (Signed)
BP 118/61   Pulse 84   Temp (!) 97.1 F (36.2 C) (Oral)   Ht 5' 2.25" (1.581 m)   Wt 269 lb (122 kg)   BMI 48.81 kg/m    Subjective:    Patient ID: Renee Thomas, female    DOB: [birth year], 46 y.o.   MRN: [id number]  HPI: Renee Thomas is a 46 y.o. female presenting on 10/03/2017 for Sinus Problem  This patient has had 5 days of sinus headache and postnasal drainage. There is copious drainage at times. Denies any fever at this time. There has been a history of sinus infections in the past.  No history of sinus surgery. Does have sputum yellow in color, denies cough or blood.  Relevant past medical, surgical, family and social history reviewed and updated as indicated. Allergies and medications reviewed and updated.  Past Medical History:  Diagnosis Date  . Anemia   . GERD (gastroesophageal reflux disease)   . MVP (mitral valve prolapse)     History reviewed. No pertinent surgical history.  Review of Systems  Constitutional: Positive for chills and fatigue. Negative for activity change, appetite change and fever.  HENT: Positive for congestion, postnasal drip, sinus pressure, sinus pain and sore throat.   Eyes: Negative.   Respiratory: Negative for cough and wheezing.   Cardiovascular: Negative.  Negative for chest pain, palpitations and leg swelling.  Gastrointestinal: Negative.   Genitourinary: Negative.   Musculoskeletal: Negative.   Skin: Negative.   Neurological: Positive for headaches.    Allergies as of 10/03/2017      Reactions   Other Anaphylaxis   All Tree-Nuts   Peanut-containing Drug Products Anaphylaxis   Eggs Or Egg-derived Products    Swells mouth with itching   Fluarix [flu Virus Vaccine]    Contains egg protein.   Amoxicillin Rash   Penicillins Rash   .Has patient had a PCN reaction causing immediate rash, facial/tongue/throat swelling, SOB or lightheadedness with hypotension: Yes Has patient had a PCN reaction causing severe rash involving mucus  membranes or skin necrosis: No Has patient had a PCN reaction that required hospitalization: no Has patient had a PCN reaction occurring within the last 10 years: no If all of the above answers are "NO", then may proceed with Cephalosporin use.   Sulfa Antibiotics Rash   numbness      Medication List        Accurate as of 10/03/17 10:59 AM. Always use your most recent med list.          azithromycin 250 MG tablet Commonly known as:  ZITHROMAX Z-PAK Take as directed   CLEAR EYES FOR DRY EYES OP Place 1-2 drops into both eyes daily as needed.   cyanocobalamin 1000 MCG/ML injection Commonly known as:  (VITAMIN B-12) Inject 1,000 mcg into the muscle every 30 (thirty) days.   fluticasone 50 MCG/ACT nasal spray Commonly known as:  FLONASE Use 1-2 sprays each nostril at bedtime   KRILL OIL PO Take by mouth.   levonorgestrel-ethinyl estradiol 0.1-20 MG-MCG tablet Commonly known as:  SRONYX Take 1 tablet by mouth daily.   naproxen 500 MG tablet Commonly known as:  NAPROSYN Take 1 tablet (500 mg total) by mouth 2 (two) times daily with a meal.   omeprazole 20 MG capsule Commonly known as:  PRILOSEC TAKE 1 CAPSULE (20 MG TOTAL) BY MOUTH DAILY.   TRUBIOTICS PO Take by mouth daily.   VITAMIN D PO Take by mouth daily.  Objective:    BP 118/61   Pulse 84   Temp (!) 97.1 F (36.2 C) (Oral)   Ht 5' 2.25" (1.581 m)   Wt 269 lb (122 kg)   BMI 48.81 kg/m   Allergies  Allergen Reactions  . Other Anaphylaxis    All Tree-Nuts  . Peanut-Containing Drug Products Anaphylaxis  . Eggs Or Egg-Derived Products     Swells mouth with itching  . Fluarix [Flu Virus Vaccine]     Contains egg protein.  Marland Kitchen. Amoxicillin Rash  . Penicillins Rash    .Has patient had a PCN reaction causing immediate rash, facial/tongue/throat swelling, SOB or lightheadedness with hypotension: Yes Has patient had a PCN reaction causing severe rash involving mucus membranes or skin necrosis:  No Has patient had a PCN reaction that required hospitalization: no Has patient had a PCN reaction occurring within the last 10 years: no If all of the above answers are "NO", then may proceed with Cephalosporin use.   . Sulfa Antibiotics Rash    numbness    Physical Exam  Constitutional: She is oriented to person, place, and time. She appears well-developed and well-nourished.  HENT:  Head: Normocephalic and atraumatic.  Right Ear: Tympanic membrane and external ear normal. No middle ear effusion.  Left Ear: Tympanic membrane and external ear normal.  No middle ear effusion.  Nose: Mucosal edema and rhinorrhea present. Right sinus exhibits frontal sinus tenderness. Right sinus exhibits no maxillary sinus tenderness. Left sinus exhibits frontal sinus tenderness. Left sinus exhibits no maxillary sinus tenderness.  Mouth/Throat: Uvula is midline. Posterior oropharyngeal erythema present.  Eyes: Conjunctivae and EOM are normal. Pupils are equal, round, and reactive to light. Right eye exhibits no discharge. Left eye exhibits no discharge.  Neck: Normal range of motion.  Cardiovascular: Normal rate, regular rhythm and normal heart sounds.  Pulmonary/Chest: Effort normal and breath sounds normal. No respiratory distress. She has no wheezes.  Abdominal: Soft.  Lymphadenopathy:    She has no cervical adenopathy.  Neurological: She is alert and oriented to person, place, and time.  Skin: Skin is warm and dry.  Psychiatric: She has a normal mood and affect.        Assessment & Plan:   1. Acute non-recurrent frontal sinusitis - azithromycin (ZITHROMAX Z-PAK) 250 MG tablet; Take as directed  Dispense: 6 each; Refill: 0     Current Outpatient Medications:  .  Carboxymethylcellul-Glycerin (CLEAR EYES FOR DRY EYES OP), Place 1-2 drops into both eyes daily as needed., Disp: , Rfl:  .  Cholecalciferol (VITAMIN D PO), Take by mouth daily., Disp: , Rfl:  .  cyanocobalamin (,VITAMIN B-12,)  1000 MCG/ML injection, Inject 1,000 mcg into the muscle every 30 (thirty) days., Disp: , Rfl:  .  fluticasone (FLONASE) 50 MCG/ACT nasal spray, Use 1-2 sprays each nostril at bedtime, Disp: 48 g, Rfl: 1 .  KRILL OIL PO, Take by mouth., Disp: , Rfl:  .  levonorgestrel-ethinyl estradiol (SRONYX) 0.1-20 MG-MCG tablet, Take 1 tablet by mouth daily., Disp: 28 tablet, Rfl: 12 .  naproxen (NAPROSYN) 500 MG tablet, Take 1 tablet (500 mg total) by mouth 2 (two) times daily with a meal., Disp: 30 tablet, Rfl: 0 .  omeprazole (PRILOSEC) 20 MG capsule, TAKE 1 CAPSULE (20 MG TOTAL) BY MOUTH DAILY., Disp: 90 capsule, Rfl: 1 .  Probiotic Product (TRUBIOTICS PO), Take by mouth daily., Disp: , Rfl:  .  azithromycin (ZITHROMAX Z-PAK) 250 MG tablet, Take as directed, Disp: 6 each, Rfl: 0  Current Facility-Administered Medications:  .  cyanocobalamin ((VITAMIN B-12)) injection 1,000 mcg, 1,000 mcg, Intramuscular, Weekly, Daphine DeutscherMartin, [name], FNP, 1,000 mcg at 09/12/17 [id number] Continue all other maintenance medications as listed above.  Follow up plan: Return if symptoms worsen or fail to improve.  Educational handout given for survey  Remus LofflerAngel S. Lindley Hiney PA-C [facility] [address]  [place], [place] [id number] [phone]   10/03/2017, 10:59 AM

## 2017-10-04 ENCOUNTER — Telehealth: Payer: Self-pay

## 2017-10-04 DIAGNOSIS — I493 Ventricular premature depolarization: Secondary | ICD-10-CM

## 2017-10-04 NOTE — Telephone Encounter (Signed)
Called pt, no answer. Left message for pt to return call. Order placed for stress test.

## 2017-10-04 NOTE — Telephone Encounter (Signed)
-----   Message from Antoine PocheJonathan F Branch, MD sent at 10/04/2017 10:05 AM EST ----- Echo overall looks good, heart muscle and pumping function is normal. Given how frequent her extra heart beats are, one other possible cause is due to a blockage in one of the arteries of her heart. She needs an exercise nuclear stress test for PVCs and chest pain, does not need to hold any meds  J BranchMD

## 2017-10-05 ENCOUNTER — Telehealth: Payer: Self-pay | Admitting: Nurse Practitioner

## 2017-10-05 NOTE — Telephone Encounter (Signed)
It is okay to extend work note?

## 2017-10-05 NOTE — Telephone Encounter (Signed)
Letter place up front. Left patient detailed message.

## 2017-10-05 NOTE — Telephone Encounter (Signed)
Yes, okay to do note 

## 2017-10-12 ENCOUNTER — Ambulatory Visit (INDEPENDENT_AMBULATORY_CARE_PROVIDER_SITE_OTHER): Payer: BLUE CROSS/BLUE SHIELD

## 2017-10-12 DIAGNOSIS — E538 Deficiency of other specified B group vitamins: Secondary | ICD-10-CM

## 2017-10-12 NOTE — Progress Notes (Signed)
B-12 injection given to left deltoid, patient tolerated well 

## 2017-10-14 ENCOUNTER — Other Ambulatory Visit: Payer: Self-pay | Admitting: Certified Nurse Midwife

## 2017-10-14 DIAGNOSIS — Z3041 Encounter for surveillance of contraceptive pills: Secondary | ICD-10-CM

## 2017-10-16 NOTE — Telephone Encounter (Signed)
Medication refill request: OCP  Last AEX:  08/29/17 DL  Next AEX: [id number]/[id number]/19  Last MMG (if hormonal medication request): 11/15/16 BIRADS 1 negative  Refill authorized: 08/25/16 #28, 12 RF. Today, please advise.

## 2017-10-16 NOTE — Telephone Encounter (Signed)
Per review of chart and cardiac evaluation, they had considered blockage and she was to have follow up and can not find if this was done. Due to the concern with cardiac issues would not recommend OCP use. So will not refill. Patient was using for cycle control. We had discussed possible IUD to see if this would work, but needs clearance with cardiac issue from cardiology.

## 2017-10-16 NOTE — Telephone Encounter (Signed)
Spoke with patient. Patient states that she has been seen by cardiology and has worn a Holter monitor and has had an EKG done and states, "so far that has come back fine." Patient states she will have a stress test tomorrow. Patient states she is aware she needs cardiac clearance prior to IUD placement.

## 2017-10-16 NOTE — Telephone Encounter (Signed)
Message left to return call to Davari Lopes at [phone].    

## 2017-10-17 ENCOUNTER — Encounter (HOSPITAL_BASED_OUTPATIENT_CLINIC_OR_DEPARTMENT_OTHER)
Admission: RE | Admit: 2017-10-17 | Discharge: 2017-10-17 | Disposition: A | Discharge status: Home / Self Care | Payer: BLUE CROSS/BLUE SHIELD | Source: Ambulatory Visit | Attending: Cardiology | Admitting: Cardiology

## 2017-10-17 ENCOUNTER — Encounter (HOSPITAL_COMMUNITY)
Admission: RE | Admit: 2017-10-17 | Discharge: 2017-10-17 | Disposition: A | Discharge status: Home / Self Care | Payer: BLUE CROSS/BLUE SHIELD | Source: Ambulatory Visit | Attending: Cardiology | Admitting: Cardiology

## 2017-10-17 ENCOUNTER — Encounter (HOSPITAL_COMMUNITY): Payer: Self-pay

## 2017-10-17 DIAGNOSIS — I493 Ventricular premature depolarization: Secondary | ICD-10-CM | POA: Diagnosis not present

## 2017-10-17 LAB — NM MYOCAR MULTI W/SPECT W/WALL MOTION / EF
LV dias vol: 117 mL (ref 46–106)
LV sys vol: 58 mL
Peak HR: 120 {beats}/min
RATE: 0.43
Rest HR: 75 {beats}/min
SDS: 17
SRS: 3
SSS: 20
TID: 1.36

## 2017-10-17 MED ORDER — REGADENOSON 0.4 MG/5ML IV SOLN
INTRAVENOUS | Status: AC
  Administered 2017-10-17: 0.4 mg via INTRAVENOUS
  Filled 2017-10-17: qty 5

## 2017-10-17 MED ORDER — TECHNETIUM TC 99M TETROFOSMIN IV KIT
10.0000 | PACK | Freq: Once | INTRAVENOUS | Status: AC | PRN
  Administered 2017-10-17: 10 via INTRAVENOUS

## 2017-10-17 MED ORDER — TECHNETIUM TC 99M TETROFOSMIN IV KIT
30.0000 | PACK | Freq: Once | INTRAVENOUS | Status: AC | PRN
  Administered 2017-10-17: 30 via INTRAVENOUS

## 2017-10-17 MED ORDER — SODIUM CHLORIDE 0.9% FLUSH
INTRAVENOUS | Status: AC
  Administered 2017-10-17: 10 mL via INTRAVENOUS
  Filled 2017-10-17: qty 10

## 2017-11-13 ENCOUNTER — Ambulatory Visit (INDEPENDENT_AMBULATORY_CARE_PROVIDER_SITE_OTHER): Payer: BLUE CROSS/BLUE SHIELD | Admitting: *Deleted

## 2017-11-13 DIAGNOSIS — E538 Deficiency of other specified B group vitamins: Secondary | ICD-10-CM

## 2017-11-13 NOTE — Progress Notes (Signed)
Pt given Cyanocobalamin inj Tolerated well 

## 2017-11-16 ENCOUNTER — Other Ambulatory Visit: Payer: Self-pay | Admitting: Nurse Practitioner

## 2017-12-14 ENCOUNTER — Ambulatory Visit: Payer: BLUE CROSS/BLUE SHIELD

## 2017-12-18 ENCOUNTER — Ambulatory Visit (INDEPENDENT_AMBULATORY_CARE_PROVIDER_SITE_OTHER): Payer: BLUE CROSS/BLUE SHIELD | Admitting: *Deleted

## 2017-12-18 DIAGNOSIS — E538 Deficiency of other specified B group vitamins: Secondary | ICD-10-CM

## 2017-12-18 DIAGNOSIS — Z1231 Encounter for screening mammogram for malignant neoplasm of breast: Secondary | ICD-10-CM | POA: Diagnosis not present

## 2018-01-16 ENCOUNTER — Ambulatory Visit (INDEPENDENT_AMBULATORY_CARE_PROVIDER_SITE_OTHER): Payer: BLUE CROSS/BLUE SHIELD | Admitting: *Deleted

## 2018-01-16 DIAGNOSIS — E538 Deficiency of other specified B group vitamins: Secondary | ICD-10-CM

## 2018-01-16 NOTE — Progress Notes (Signed)
Pt given cyanocobalamin inj Tolerated well 

## 2018-02-10 ENCOUNTER — Other Ambulatory Visit: Payer: Self-pay | Admitting: Nurse Practitioner

## 2018-02-16 ENCOUNTER — Ambulatory Visit: Payer: BLUE CROSS/BLUE SHIELD | Admitting: Family Medicine

## 2018-02-16 ENCOUNTER — Encounter: Payer: Self-pay | Admitting: Family Medicine

## 2018-02-16 VITALS — BP 134/73 | HR 76 | Wt 266.0 lb

## 2018-02-16 DIAGNOSIS — J301 Allergic rhinitis due to pollen: Secondary | ICD-10-CM | POA: Diagnosis not present

## 2018-02-16 MED ORDER — NAPROXEN 500 MG PO TABS: ORAL_TABLET | ORAL | 1 refills | Status: DC

## 2018-02-16 MED ORDER — PREDNISONE 20 MG PO TABS: ORAL_TABLET | ORAL | 0 refills | Status: DC

## 2018-02-16 NOTE — Progress Notes (Signed)
BP 134/73 (BP Location: Right Wrist, Patient Position: Sitting, Cuff Size: Small)   Pulse 76   Wt 266 lb (120.7 kg)   BMI 48.26 kg/m    Subjective:    Patient ID: Renee Thomas, female    DOB: 10/22/1971, 47 y.o.   MRN: [id number]  HPI: Renee Thomas is a 47 y.o. female presenting on 02/16/2018 for Cough (post nasal drainage and irritated throat x 3 days.); Otalgia (left ear ringing and full feeling); Sinusitis (frontal and maxillary facial pain. Usually has these symptoms routinely this time of year. Has taken plain mucinex and tylenol and children Benadryl. ); and Medication Refill (would like refill on naproxen)   HPI Sinus congestion and drainage and sore throat Patient comes in today complaining of sinus congestion and drainage and sore throat that is been going on for the past 3 days.  She has been using Benadryl and Tylenol and Mucinex and they have been helping some.  She does often get allergies this time year and she thinks it is probably correlated to that.  She denies any fevers or chills or shortness of breath or wheezing.  She does have ringing in her left ear as well along with the sinus drainage.  Relevant past medical, surgical, family and social history reviewed and updated as indicated. Interim medical history since our last visit reviewed. Allergies and medications reviewed and updated.  Review of Systems  Constitutional: Negative for chills and fever.  HENT: Positive for congestion, postnasal drip, rhinorrhea, sinus pressure, sneezing and sore throat. Negative for ear discharge and ear pain.   Eyes: Negative for pain, redness and visual disturbance.  Respiratory: Positive for cough. Negative for chest tightness and shortness of breath.   Cardiovascular: Negative for chest pain and leg swelling.  Genitourinary: Negative for difficulty urinating and dysuria.  Musculoskeletal: Negative for back pain and gait problem.  Skin: Negative for rash.  Neurological: Negative  for light-headedness and headaches.  Psychiatric/Behavioral: Negative for agitation and behavioral problems.  All other systems reviewed and are negative.   Per HPI unless specifically indicated above   Allergies as of 02/16/2018      Reactions   Other Anaphylaxis   All Tree-Nuts   Peanut-containing Drug Products Anaphylaxis   Eggs Or Egg-derived Products    Swells mouth with itching   Fluarix [flu Virus Vaccine]    Contains egg protein.   Amoxicillin Rash   Penicillins Rash   .Has patient had a PCN reaction causing immediate rash, facial/tongue/throat swelling, SOB or lightheadedness with hypotension: Yes Has patient had a PCN reaction causing severe rash involving mucus membranes or skin necrosis: No Has patient had a PCN reaction that required hospitalization: no Has patient had a PCN reaction occurring within the last 10 years: no If all of the above answers are "NO", then may proceed with Cephalosporin use.   Sulfa Antibiotics Rash   numbness      Medication List        Accurate as of 02/16/18  9:02 AM. Always use your most recent med list.          CLEAR EYES FOR DRY EYES OP Place 1-2 drops into both eyes daily as needed.   cyanocobalamin 1000 MCG/ML injection Commonly known as:  (VITAMIN B-12) Inject 1,000 mcg into the muscle every 30 (thirty) days.   fluticasone 50 MCG/ACT nasal spray Commonly known as:  FLONASE Use 1-2 sprays each nostril at bedtime   KRILL OIL PO Take by mouth.  levonorgestrel-ethinyl estradiol 0.1-20 MG-MCG tablet Commonly known as:  SRONYX Take 1 tablet by mouth daily.   naproxen 500 MG tablet Commonly known as:  NAPROSYN Take 1 tablet (500 mg total) by mouth 2 (two) times daily with a meal.   omeprazole 20 MG capsule Commonly known as:  PRILOSEC TAKE 1 CAPSULE BY MOUTH EVERY DAY   predniSONE 20 MG tablet Commonly known as:  DELTASONE 2 po at same time daily for 5 days   TRUBIOTICS PO Take by mouth daily.   VITAMIN D  PO Take by mouth daily.          Objective:    BP 134/73 (BP Location: Right Wrist, Patient Position: Sitting, Cuff Size: Small)   Pulse 76   Wt 266 lb (120.7 kg)   BMI 48.26 kg/m   Wt Readings from Last 3 Encounters:  02/16/18 266 lb (120.7 kg)  10/03/17 269 lb (122 kg)  08/29/17 268 lb (121.6 kg)    Physical Exam  Constitutional: She is oriented to person, place, and time. She appears well-developed and well-nourished. No distress.  HENT:  Right Ear: Tympanic membrane, external ear and ear canal normal.  Left Ear: Tympanic membrane, external ear and ear canal normal.  Nose: Mucosal edema and rhinorrhea present. No epistaxis. Right sinus exhibits no maxillary sinus tenderness and no frontal sinus tenderness. Left sinus exhibits no maxillary sinus tenderness and no frontal sinus tenderness.  Mouth/Throat: Uvula is midline and mucous membranes are normal. Posterior oropharyngeal edema and posterior oropharyngeal erythema present. No oropharyngeal exudate or tonsillar abscesses.  Eyes: Conjunctivae and EOM are normal.  Cardiovascular: Normal rate, regular rhythm, normal heart sounds and intact distal pulses.  No murmur heard. Pulmonary/Chest: Effort normal and breath sounds normal. No respiratory distress. She has no wheezes.  Musculoskeletal: Normal range of motion. She exhibits no edema or tenderness.  Neurological: She is alert and oriented to person, place, and time. Coordination normal.  Skin: Skin is warm and dry. No rash noted. She is not diaphoretic.  Psychiatric: She has a normal mood and affect. Her behavior is normal.  Vitals reviewed.      Assessment & Plan:   Problem List Items Addressed This Visit    None    Visit Diagnoses    Seasonal allergic rhinitis due to pollen    -  Primary   Relevant Medications   predniSONE (DELTASONE) 20 MG tablet       Follow up plan: Return if symptoms worsen or fail to improve.  Counseling provided for all of the vaccine  components No orders of the defined types were placed in this encounter.   Arville CareJoshua Oluchi Pucci, MD [facility] 02/16/2018, 9:02 AM

## 2018-02-19 ENCOUNTER — Ambulatory Visit (INDEPENDENT_AMBULATORY_CARE_PROVIDER_SITE_OTHER): Payer: BLUE CROSS/BLUE SHIELD

## 2018-02-19 DIAGNOSIS — E538 Deficiency of other specified B group vitamins: Secondary | ICD-10-CM | POA: Diagnosis not present

## 2018-02-19 NOTE — Progress Notes (Signed)
Patient given B-12 injection to right deltoid, tolerated well 

## 2018-02-19 NOTE — Patient Instructions (Signed)
Cyanocobalamin, Vitamin B12 injection What is this medicine? CYANOCOBALAMIN (sye an oh koe BAL a min) is a man made form of vitamin B12. Vitamin B12 is used in the growth of healthy blood cells, nerve cells, and proteins in the body. It also helps with the metabolism of fats and carbohydrates. This medicine is used to treat people who can not absorb vitamin B12. This medicine may be used for other purposes; ask your health care provider or pharmacist if you have questions. COMMON BRAND NAME(S): B-12 Compliance Kit, B-12 Injection Kit, Cyomin, LA-12, Nutri-Twelve, Physicians EZ Use B-12, Primabalt What should I tell my health care provider before I take this medicine? They need to know if you have any of these conditions: -kidney disease -Leber's disease -megaloblastic anemia -an unusual or allergic reaction to cyanocobalamin, cobalt, other medicines, foods, dyes, or preservatives -pregnant or trying to get pregnant -breast-feeding How should I use this medicine? This medicine is injected into a muscle or deeply under the skin. It is usually given by a health care professional in a clinic or doctor's office. However, your doctor may teach you how to inject yourself. Follow all instructions. Talk to your pediatrician regarding the use of this medicine in children. Special care may be needed. Overdosage: If you think you have taken too much of this medicine contact a poison control center or emergency room at once. NOTE: This medicine is only for you. Do not share this medicine with others. What if I miss a dose? If you are given your dose at a clinic or doctor's office, call to reschedule your appointment. If you give your own injections and you miss a dose, take it as soon as you can. If it is almost time for your next dose, take only that dose. Do not take double or extra doses. What may interact with this medicine? -colchicine -heavy alcohol intake This list may not describe all possible  interactions. Give your health care provider a list of all the medicines, herbs, non-prescription drugs, or dietary supplements you use. Also tell them if you smoke, drink alcohol, or use illegal drugs. Some items may interact with your medicine. What should I watch for while using this medicine? Visit your doctor or health care professional regularly. You may need blood work done while you are taking this medicine. You may need to follow a special diet. Talk to your doctor. Limit your alcohol intake and avoid smoking to get the best benefit. What side effects may I notice from receiving this medicine? Side effects that you should report to your doctor or health care professional as soon as possible: -allergic reactions like skin rash, itching or hives, swelling of the face, lips, or tongue -blue tint to skin -chest tightness, pain -difficulty breathing, wheezing -dizziness -red, swollen painful area on the leg Side effects that usually do not require medical attention (report to your doctor or health care professional if they continue or are bothersome): -diarrhea -headache This list may not describe all possible side effects. Call your doctor for medical advice about side effects. You may report side effects to FDA at 1-800-FDA-1088. Where should I keep my medicine? Keep out of the reach of children. Store at room temperature between 15 and 30 degrees C (59 and 85 degrees F). Protect from light. Throw away any unused medicine after the expiration date. NOTE: This sheet is a summary. It may not cover all possible information. If you have questions about this medicine, talk to your doctor, pharmacist, or   health care provider.  2018 Elsevier/Gold Standard (2008-02-03 22:10:20)  

## 2018-02-27 ENCOUNTER — Telehealth: Payer: Self-pay | Admitting: Certified Nurse Midwife

## 2018-02-27 DIAGNOSIS — Z1211 Encounter for screening for malignant neoplasm of colon: Secondary | ICD-10-CM

## 2018-02-27 NOTE — Telephone Encounter (Signed)
Patient is calling to check on status of an appointment for a colonoscopy referral.

## 2018-02-27 NOTE — Telephone Encounter (Signed)
Reviewed with Leota Sauerseborah Leonard, CNM, call returned to patient. Referral placed to Dr. Loreta AveMann for colonoscopy consult. Patient aware colonoscopy may not be recommended until after age 49. Patient request to proceed with referral. Advised our office referral coordinator will return call with appointment details once scheduled.   Routing to provider for final review. Patient is agreeable to disposition. Will close encounter.   Cc: Soundra Pilonosa Davis

## 2018-03-19 ENCOUNTER — Ambulatory Visit (INDEPENDENT_AMBULATORY_CARE_PROVIDER_SITE_OTHER): Payer: BLUE CROSS/BLUE SHIELD | Admitting: *Deleted

## 2018-03-19 DIAGNOSIS — E538 Deficiency of other specified B group vitamins: Secondary | ICD-10-CM | POA: Diagnosis not present

## 2018-03-19 NOTE — Progress Notes (Signed)
Pt given Cyanocobalamin inj Tolerated well 

## 2018-03-28 DIAGNOSIS — K5904 Chronic idiopathic constipation: Secondary | ICD-10-CM | POA: Diagnosis not present

## 2018-03-28 DIAGNOSIS — Z1211 Encounter for screening for malignant neoplasm of colon: Secondary | ICD-10-CM | POA: Diagnosis not present

## 2018-03-28 DIAGNOSIS — K219 Gastro-esophageal reflux disease without esophagitis: Secondary | ICD-10-CM | POA: Diagnosis not present

## 2018-03-28 DIAGNOSIS — R140 Abdominal distension (gaseous): Secondary | ICD-10-CM | POA: Diagnosis not present

## 2018-04-19 ENCOUNTER — Ambulatory Visit (INDEPENDENT_AMBULATORY_CARE_PROVIDER_SITE_OTHER): Payer: BLUE CROSS/BLUE SHIELD | Admitting: *Deleted

## 2018-04-19 DIAGNOSIS — E538 Deficiency of other specified B group vitamins: Secondary | ICD-10-CM | POA: Diagnosis not present

## 2018-04-19 NOTE — Progress Notes (Signed)
Pt given Cyanocobalamin inj Tolerated well 

## 2018-05-01 ENCOUNTER — Encounter: Payer: Self-pay | Admitting: Certified Nurse Midwife

## 2018-05-01 DIAGNOSIS — D124 Benign neoplasm of descending colon: Secondary | ICD-10-CM | POA: Diagnosis not present

## 2018-05-01 DIAGNOSIS — D123 Benign neoplasm of transverse colon: Secondary | ICD-10-CM | POA: Diagnosis not present

## 2018-05-01 DIAGNOSIS — D128 Benign neoplasm of rectum: Secondary | ICD-10-CM | POA: Diagnosis not present

## 2018-05-01 DIAGNOSIS — K635 Polyp of colon: Secondary | ICD-10-CM | POA: Diagnosis not present

## 2018-05-01 DIAGNOSIS — Z1211 Encounter for screening for malignant neoplasm of colon: Secondary | ICD-10-CM | POA: Diagnosis not present

## 2018-05-01 DIAGNOSIS — K621 Rectal polyp: Secondary | ICD-10-CM | POA: Diagnosis not present

## 2018-05-01 DIAGNOSIS — K6289 Other specified diseases of anus and rectum: Secondary | ICD-10-CM | POA: Diagnosis not present

## 2018-05-01 DIAGNOSIS — Z86010 Personal history of colonic polyps: Secondary | ICD-10-CM | POA: Diagnosis not present

## 2018-05-20 ENCOUNTER — Ambulatory Visit (INDEPENDENT_AMBULATORY_CARE_PROVIDER_SITE_OTHER): Payer: BLUE CROSS/BLUE SHIELD | Admitting: *Deleted

## 2018-05-20 DIAGNOSIS — E538 Deficiency of other specified B group vitamins: Secondary | ICD-10-CM

## 2018-05-31 ENCOUNTER — Other Ambulatory Visit: Payer: Self-pay | Admitting: Nurse Practitioner

## 2018-06-21 ENCOUNTER — Ambulatory Visit (INDEPENDENT_AMBULATORY_CARE_PROVIDER_SITE_OTHER): Payer: BLUE CROSS/BLUE SHIELD | Admitting: *Deleted

## 2018-06-21 DIAGNOSIS — E538 Deficiency of other specified B group vitamins: Secondary | ICD-10-CM | POA: Diagnosis not present

## 2018-06-21 NOTE — Progress Notes (Signed)
Pt given Cyanocobalamin inj Tolerated well 

## 2018-07-05 ENCOUNTER — Ambulatory Visit: Payer: BLUE CROSS/BLUE SHIELD | Admitting: Nurse Practitioner

## 2018-07-05 ENCOUNTER — Encounter: Payer: Self-pay | Admitting: Nurse Practitioner

## 2018-07-05 ENCOUNTER — Ambulatory Visit (INDEPENDENT_AMBULATORY_CARE_PROVIDER_SITE_OTHER): Payer: BLUE CROSS/BLUE SHIELD

## 2018-07-05 VITALS — BP 120/70 | HR 67 | Temp 97.1°F | Ht 62.0 in | Wt 276.0 lb

## 2018-07-05 DIAGNOSIS — R55 Syncope and collapse: Secondary | ICD-10-CM

## 2018-07-05 DIAGNOSIS — M79671 Pain in right foot: Secondary | ICD-10-CM

## 2018-07-05 DIAGNOSIS — E538 Deficiency of other specified B group vitamins: Secondary | ICD-10-CM | POA: Diagnosis not present

## 2018-07-05 DIAGNOSIS — R6889 Other general symptoms and signs: Secondary | ICD-10-CM | POA: Diagnosis not present

## 2018-07-05 DIAGNOSIS — Z136 Encounter for screening for cardiovascular disorders: Secondary | ICD-10-CM | POA: Diagnosis not present

## 2018-07-05 NOTE — Patient Instructions (Signed)
Near-Syncope °Near-syncope is when you suddenly get weak or dizzy, or you feel like you might pass out (faint). During an episode of near-syncope, you may: °· Feel dizzy or light-headed. °· Feel sick to your stomach (nauseous). °· See all white or all black. °· Have cold, clammy skin. ° °If you passed out, get help right away.Call your local emergency services (911 in the [place] Do not drive yourself to the hospital. °Follow these instructions at home: °Pay attention to any changes in your symptoms. Take these actions to help with your condition: °· Have someone stay with you until you feel stable. °· Do not drive, use machinery, or play sports until your doctor says it is okay. °· Keep all follow-up visits as told by your doctor. This is important. °· If you start to feel like you might pass out, lie down right away and raise (elevate) your feet above the level of your heart. Breathe deeply and steadily. Wait until all of the symptoms are gone. °· Drink enough fluid to keep your pee (urine) clear or pale yellow. °· If you are taking blood pressure or heart medicine, get up slowly and spend many minutes getting ready to sit and then stand. This can help with dizziness. °· Take over-the-counter and prescription medicines only as told by your doctor. ° °Get help right away if: °· You have a very bad headache. °· You have unusual pain in your chest, tummy, or back. °· You are bleeding from your mouth or rectum. °· You have black or tarry poop (stool). °· You have a very fast or uneven heartbeat (palpitations). °· You pass out one time or more than once. °· You have jerky movements that you cannot control (seizure). °· You are confused. °· You have trouble walking. °· You are very weak. °· You have vision problems. °These symptoms may be an emergency. Do not wait to see if the symptoms will go away. Get medical help right away. Call your local emergency services (911 in the [place] Do not drive yourself to the  hospital. °This information is not intended to replace advice given to you by your health care provider. Make sure you discuss any questions you have with your health care provider. °Document Released: 04/10/2008 Document Revised: 03/30/2016 Document Reviewed: 07/07/2015 °Elsevier Interactive Patient Education © 2017 Elsevier Inc. ° °

## 2018-07-05 NOTE — Progress Notes (Signed)
Subjective:    Patient ID: Renee Thomas, female    DOB: April 15, 1971, 47 y.o.   MRN: [id number]   Chief Complaint: Medical Management of Chronic Issues (Pain in right foot   Also having periods of feeling like she will pass out when walking)   HPI:  1. Low serum vitamin B12  Still taking b12 injections monthly- has not had levels checked in awhile.    Outpatient Encounter Medications as of 07/05/2018  Medication Sig  . Carboxymethylcellul-Glycerin (CLEAR EYES FOR DRY EYES OP) Place 1-2 drops into both eyes daily as needed.  . cetirizine (ZYRTEC) 10 MG tablet Take 10 mg by mouth daily.  . Cholecalciferol (VITAMIN D PO) Take by mouth daily.  . cyanocobalamin (,VITAMIN B-12,) 1000 MCG/ML injection Inject 1,000 mcg into the muscle every 30 (thirty) days.  . fluticasone (FLONASE) 50 MCG/ACT nasal spray Use 1-2 sprays each nostril at bedtime  . KRILL OIL PO Take 350 mg by mouth daily.   Marland Kitchen omeprazole (PRILOSEC) 20 MG capsule TAKE 1 CAPSULE BY MOUTH EVERY DAY  . Probiotic Product (TRUBIOTICS PO) Take by mouth daily.       New complaints: Pain in right foot, it's a "catch", not constant, 6/10 Random episodes last 1 minute, started 2 weeks ago, worse with activity -dropped a pack of frozen chicken breasts on foot 6 months ago   Near syncope -when walking fast, long distances, takes deep breaths -2 weeks Has not passed out -recent heavy cycles x2 , had no period for 6 monthsthen all the sudden started having again.  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   HENT: Negative.   Respiratory: Positive for shortness of breath (only when walking fast). Negative for cough.   Cardiovascular: Positive for palpitations and leg swelling (only after work around [employer] area). Negative for chest pain.    Gastrointestinal: Negative.   Genitourinary: Negative.   Neurological: Positive for syncope (near). Negative for dizziness, light-headedness and headaches.  Psychiatric/Behavioral: Negative.   All other systems reviewed and are negative.      Objective:   Physical Exam  Constitutional: She is oriented to person, place, and time. She appears well-developed and well-nourished. No distress.  HENT:  Head: Normocephalic.  Nose: Nose normal.  Mouth/Throat: Oropharynx is clear and moist.  Eyes: Pupils are equal, round, and reactive to light. EOM are normal.  Neck: Normal range of motion. Neck supple. No JVD present. Carotid bruit is not present.  Cardiovascular: Normal rate, regular rhythm, normal heart sounds and intact distal pulses.  Pulmonary/Chest: Effort normal and breath sounds normal. No respiratory distress. She has no wheezes. She has no rales. She exhibits no tenderness.  Abdominal: Soft. Normal appearance, normal aorta and bowel sounds are normal. She exhibits no distension, no abdominal bruit, no pulsatile midline mass and no mass. There is no splenomegaly or hepatomegaly. There is no tenderness.  Musculoskeletal: Normal range of motion. She exhibits no edema.  Right foot pain on palpation along talus. No edema  Lymphadenopathy:    She has no cervical adenopathy.  Neurological: She is alert and oriented to person, place, and time. She has normal reflexes. A cranial nerve deficit is present.  Skin: Skin is warm and dry.  Psychiatric: She has a normal mood and affect. Her behavior is normal. Judgment and thought content normal.  Nursing note and vitals reviewed.   BP 120/70   Pulse 67   Temp (!) 97.1 F (36.2 C) (Oral)   Ht '5\' 2"'  (1.575 m)   Wt 276 lb (125.2 kg)   BMI 50.48 kg/m   EKG- NSR with PVC's- [name] Hassell Done, FNP  Foot xray- no fracture visible- mild osteoarthritic changes-Preliminary reading by Ronnald Collum, FNP  [facility]      Assessment & Plan:  Renee Thomas comes in today with chief complaint of Medical Management of Chronic Issues (Pain in right foot   Also having periods of feeling like she will pass out when walking)   Diagnosis and orders addressed:  1. Low serum vitamin B12 - Vitamin B12  2. Near syncope kep diary of events - EKG 12-Lead - CBC with Differential/Platelet - CMP14+EGFR - Lipid panel - Thyroid Panel With TSH  3. Right foot pain Wear foot elastic support - DG Foot Complete Right; Future   Labs pending Health Maintenance reviewed Diet and exercise encouraged  Follow up plan: 3-6 months   [name] Hassell Done, FNP

## 2018-07-06 LAB — LIPID PANEL
Chol/HDL Ratio: 2.8 ratio (ref 0.0–4.4)
Cholesterol, Total: 201 mg/dL — ABNORMAL HIGH (ref 100–199)
HDL: 71 mg/dL (ref >39)
LDL Calculated: 117 mg/dL — ABNORMAL HIGH (ref 0–99)
Triglycerides: 65 mg/dL (ref 0–149)
VLDL Cholesterol Cal: 13 mg/dL (ref 5–40)

## 2018-07-06 LAB — CBC WITH DIFFERENTIAL/PLATELET
Basophils Absolute: 0 10*3/uL (ref 0.0–0.2)
Basos: 1 %
EOS (ABSOLUTE): 0.2 10*3/uL (ref 0.0–0.4)
Eos: 4 %
Hematocrit: 36.4 % (ref 34.0–46.6)
Hemoglobin: 11.8 g/dL (ref 11.1–15.9)
Immature Grans (Abs): 0 10*3/uL (ref 0.0–0.1)
Immature Granulocytes: 0 %
Lymphocytes Absolute: 2.7 10*3/uL (ref 0.7–3.1)
Lymphs: 48 %
MCH: 29.1 pg (ref 26.6–33.0)
MCHC: 32.4 g/dL (ref 31.5–35.7)
MCV: 90 fL (ref 79–97)
Monocytes Absolute: 0.6 10*3/uL (ref 0.1–0.9)
Monocytes: 10 %
Neutrophils Absolute: 2.1 10*3/uL (ref 1.4–7.0)
Neutrophils: 37 %
Platelets: 319 10*3/uL (ref 150–450)
RBC: 4.05 x10E6/uL (ref 3.77–5.28)
RDW: 13.1 % (ref 12.3–15.4)
WBC: 5.6 10*3/uL (ref 3.4–10.8)

## 2018-07-06 LAB — THYROID PANEL WITH TSH
Free Thyroxine Index: 1.9 (ref 1.2–4.9)
T3 Uptake Ratio: 23 % — ABNORMAL LOW (ref 24–39)
T4, Total: 8.3 ug/dL (ref 4.5–12.0)
TSH: 3.25 u[IU]/mL (ref 0.450–4.500)

## 2018-07-06 LAB — CMP14+EGFR
ALT: 19 IU/L (ref 0–32)
AST: 16 IU/L (ref 0–40)
Albumin/Globulin Ratio: 1.5 (ref 1.2–2.2)
Albumin: 4.5 g/dL (ref 3.5–5.5)
Alkaline Phosphatase: 94 IU/L (ref 39–117)
BUN/Creatinine Ratio: 19 (ref 9–23)
BUN: 14 mg/dL (ref 6–24)
Bilirubin Total: 0.6 mg/dL (ref 0.0–1.2)
CO2: 23 mmol/L (ref 20–29)
Calcium: 9.8 mg/dL (ref 8.7–10.2)
Chloride: 101 mmol/L (ref 96–106)
Creatinine, Ser: 0.74 mg/dL (ref 0.57–1.00)
GFR calc Af Amer: 110 mL/min/{1.73_m2} (ref >59)
GFR calc non Af Amer: 95 mL/min/{1.73_m2} (ref >59)
Globulin, Total: 3.1 g/dL (ref 1.5–4.5)
Glucose: 78 mg/dL (ref 65–99)
Potassium: 4.2 mmol/L (ref 3.5–5.2)
Sodium: 140 mmol/L (ref 134–144)
Total Protein: 7.6 g/dL (ref 6.0–8.5)

## 2018-07-06 LAB — VITAMIN B12: Vitamin B-12: 942 pg/mL (ref 232–1245)

## 2018-07-22 ENCOUNTER — Ambulatory Visit: Payer: BLUE CROSS/BLUE SHIELD

## 2018-07-23 ENCOUNTER — Ambulatory Visit (INDEPENDENT_AMBULATORY_CARE_PROVIDER_SITE_OTHER): Payer: BLUE CROSS/BLUE SHIELD | Admitting: *Deleted

## 2018-07-23 DIAGNOSIS — E538 Deficiency of other specified B group vitamins: Secondary | ICD-10-CM

## 2018-07-23 NOTE — Progress Notes (Signed)
Pt given Cyanocobalamin inj Tolerated well 

## 2018-08-22 ENCOUNTER — Ambulatory Visit (INDEPENDENT_AMBULATORY_CARE_PROVIDER_SITE_OTHER): Payer: BLUE CROSS/BLUE SHIELD | Admitting: *Deleted

## 2018-08-22 DIAGNOSIS — E538 Deficiency of other specified B group vitamins: Secondary | ICD-10-CM | POA: Diagnosis not present

## 2018-08-22 NOTE — Progress Notes (Signed)
Pt given cyanocobalamin inj Tolerated well 

## 2018-08-23 ENCOUNTER — Other Ambulatory Visit: Payer: Self-pay | Admitting: Nurse Practitioner

## 2018-09-18 ENCOUNTER — Ambulatory Visit: Payer: BLUE CROSS/BLUE SHIELD | Admitting: Certified Nurse Midwife

## 2018-09-18 ENCOUNTER — Encounter: Payer: Self-pay | Admitting: Certified Nurse Midwife

## 2018-09-18 VITALS — BP 118/80 | HR 70 | Resp 16 | Ht 65.5 in | Wt 272.0 lb

## 2018-09-18 DIAGNOSIS — Z01419 Encounter for gynecological examination (general) (routine) without abnormal findings: Secondary | ICD-10-CM

## 2018-09-18 DIAGNOSIS — N951 Menopausal and female climacteric states: Secondary | ICD-10-CM

## 2018-09-18 DIAGNOSIS — E663 Overweight: Secondary | ICD-10-CM | POA: Diagnosis not present

## 2018-09-18 DIAGNOSIS — Z8742 Personal history of other diseases of the female genital tract: Secondary | ICD-10-CM

## 2018-09-18 NOTE — Patient Instructions (Signed)

## 2018-09-18 NOTE — Progress Notes (Signed)
47 y.o. G0P0000 Single  African American Fe here for annual exam. Patient had amenorrhea for about 5 months and then started 6/19 which was very heavy and having occasional clots, but only 1-2 heavy days with some cramping, no medication needed. Not sexually active in last year, no STD screening needed. Condoms for contraception. Sees PCP Dr. Daphine Deutscher every 6 months for Vitamin D and B 12. Continues to have PVC's, had work up all was benign. No medication to treat at this point. Planning flu shot soon, her first one. No other health issues today. Has bought Christmas presents !  Patient's last menstrual period was 09/04/2018 (exact date).          Sexually active: No.  The current method of family planning is abstinence.    Exercising: Yes.    walk occ Smoker:  no  Review of Systems  Constitutional:       Weight gain  HENT:       Sinusitis  Eyes: Negative.   Respiratory: Negative.   Cardiovascular: Negative.   Gastrointestinal:       Bloating  Genitourinary:       Menstrual cycle changes  Musculoskeletal: Positive for joint pain and myalgias.  Skin: Negative.   Neurological: Negative.   Endo/Heme/Allergies: Negative.   Psychiatric/Behavioral: Negative.     Health Maintenance: Pap:  08-25-16 neg HPV HR neg History of Abnormal Pap: no MMG:  11-15-16 category a density birads 1:neg Self Breast exams: yes Colonoscopy:  2019 f/u 83yrs polyps BMD:  none TDaP:  2014 Shingles: no Pneumonia: no Hep C and HIV: HIV neg 2016, Hep c neg 2017 Labs: no   reports that she has never smoked. She has never used smokeless tobacco. She reports that she does not drink alcohol or use drugs.  Past Medical History:  Diagnosis Date  . Anemia   . GERD (gastroesophageal reflux disease)   . MVP (mitral valve prolapse)   . PVC's (premature ventricular contractions)     History reviewed. No pertinent surgical history.  Current Outpatient Medications  Medication Sig Dispense Refill  .  Carboxymethylcellul-Glycerin (CLEAR EYES FOR DRY EYES OP) Place 1-2 drops into both eyes daily as needed.    . cetirizine (ZYRTEC) 10 MG tablet Take 10 mg by mouth daily.    . Cholecalciferol (VITAMIN D PO) Take by mouth daily.    . cyanocobalamin (,VITAMIN B-12,) 1000 MCG/ML injection Inject 1,000 mcg into the muscle every 30 (thirty) days.    . fluticasone (FLONASE) 50 MCG/ACT nasal spray Use 1-2 sprays each nostril at bedtime 48 g 1  . KRILL OIL PO Take 350 mg by mouth daily.     Marland Kitchen omeprazole (PRILOSEC) 20 MG capsule TAKE 1 CAPSULE BY MOUTH EVERY DAY 90 capsule 0  . Probiotic Product (TRUBIOTICS PO) Take by mouth daily.     No current facility-administered medications for this visit.     Family History  Problem Relation Age of Onset  . Hypertension Mother   . Diabetes Mother   . Other Mother        renal disease  . Hypertension Father   . Heart attack Father   . Hypertension Sister   . Diabetes Brother   . Diabetes Brother     ROS:  Pertinent items are noted in HPI.  Otherwise, a comprehensive ROS was negative.  Exam:   BP 118/80   Pulse 70   Resp 16   Ht 5' 5.5" (1.664 m)   Wt 272 lb (  123.4 kg)   LMP 09/04/2018 (Exact Date)   BMI 44.57 kg/m  Height: 5' 5.5" (166.4 cm) Ht Readings from Last 3 Encounters:  09/18/18 5' 5.5" (1.664 m)  07/05/18 5\' 2"  (1.575 m)  10/03/17 5' 2.25" (1.581 m)    General appearance: alert, cooperative and appears stated age Head: Normocephalic, without obvious abnormality, atraumatic Neck: no adenopathy, supple, symmetrical, trachea midline and thyroid normal to inspection and palpation Lungs: clear to auscultation bilaterally Breasts: normal appearance, no masses or tenderness, No nipple retraction or dimpling, No nipple discharge or bleeding, No axillary or supraclavicular adenopathy Heart: regular rate and rhythm Abdomen: soft, non-tender; no masses,  no organomegaly Extremities: extremities normal, atraumatic, no cyanosis or  edema Skin: Skin color, texture, turgor normal. No rashes or lesions Lymph nodes: Cervical, supraclavicular, and axillary nodes normal. No abnormal inguinal nodes palpated Neurologic: Grossly normal   Pelvic: External genitalia:  no lesions              Urethra:  normal appearing urethra with no masses, tenderness or lesions              Bartholin's and Skene's: normal                 Vagina: normal appearing vagina with normal color and discharge, no lesions              Cervix: no cervical motion tenderness, no lesions and normal appearance              Pap taken: No. Bimanual Exam:  Uterus:  normal size, contour, position, consistency, mobility, non-tender and anteverted              Adnexa: normal adnexa and no mass, fullness, tenderness               Rectovaginal: Confirms               Anus:  normal sphincter tone, no lesions  Chaperone present: yes  A:  Well Woman with normal exam  Contraception condoms if needed  Amenorrhea in past year with normal periods ?perimenopausal  Overweight with 4 pound weight gain, working on weight loss now  PCP management of Vitamin D, B12, GERD  P:   Reviewed health and wellness pertinent to exam  Discussed need to advise if no menses in 3 months any time in next year, to avoid heavy bleeding. Given information regarding perimenopausal changes and menstrual changes with amenorrhea. Questions addressed.  Encouraged to continue weight loss and exercise for better health profile.  Continue follow up with PCP as indicated   Pap smear: no  counseled on breast self exam, mammography screening, STD prevention, HIV risk factors and prevention, feminine hygiene, adequate intake of calcium and vitamin D, diet and exercise  return annually or prn  An After Visit Summary was printed and given to the patient.

## 2018-09-30 ENCOUNTER — Ambulatory Visit (INDEPENDENT_AMBULATORY_CARE_PROVIDER_SITE_OTHER): Payer: BLUE CROSS/BLUE SHIELD | Admitting: *Deleted

## 2018-09-30 DIAGNOSIS — E538 Deficiency of other specified B group vitamins: Secondary | ICD-10-CM

## 2018-09-30 DIAGNOSIS — Z23 Encounter for immunization: Secondary | ICD-10-CM

## 2018-09-30 MED ORDER — CYANOCOBALAMIN 1000 MCG/ML IJ SOLN
1000.0000 ug | INTRAMUSCULAR | Status: AC
  Administered 2018-09-30 – 2019-09-09 (×12): 1000 ug via INTRAMUSCULAR

## 2018-09-30 NOTE — Progress Notes (Signed)
Pt given cyanocobalamin inj Tolerated well 

## 2018-10-31 ENCOUNTER — Ambulatory Visit (INDEPENDENT_AMBULATORY_CARE_PROVIDER_SITE_OTHER): Payer: BLUE CROSS/BLUE SHIELD | Admitting: *Deleted

## 2018-10-31 DIAGNOSIS — E538 Deficiency of other specified B group vitamins: Secondary | ICD-10-CM

## 2018-10-31 NOTE — Patient Instructions (Signed)
Cyanocobalamin, Vitamin B12 injection What is this medicine? CYANOCOBALAMIN (sye an oh koe BAL a min) is a man made form of vitamin B12. Vitamin B12 is used in the growth of healthy blood cells, nerve cells, and proteins in the body. It also helps with the metabolism of fats and carbohydrates. This medicine is used to treat people who can not absorb vitamin B12. This medicine may be used for other purposes; ask your health care provider or pharmacist if you have questions. COMMON BRAND NAME(S): B-12 Compliance Kit, B-12 Injection Kit, Cyomin, LA-12, Nutri-Twelve, Physicians EZ Use B-12, Primabalt What should I tell my health care provider before I take this medicine? They need to know if you have any of these conditions: -kidney disease -Leber's disease -megaloblastic anemia -an unusual or allergic reaction to cyanocobalamin, cobalt, other medicines, foods, dyes, or preservatives -pregnant or trying to get pregnant -breast-feeding How should I use this medicine? This medicine is injected into a muscle or deeply under the skin. It is usually given by a health care professional in a clinic or doctor's office. However, your doctor may teach you how to inject yourself. Follow all instructions. Talk to your pediatrician regarding the use of this medicine in children. Special care may be needed. Overdosage: If you think you have taken too much of this medicine contact a poison control center or emergency room at once. NOTE: This medicine is only for you. Do not share this medicine with others. What if I miss a dose? If you are given your dose at a clinic or doctor's office, call to reschedule your appointment. If you give your own injections and you miss a dose, take it as soon as you can. If it is almost time for your next dose, take only that dose. Do not take double or extra doses. What may interact with this medicine? -colchicine -heavy alcohol intake This list may not describe all possible  interactions. Give your health care provider a list of all the medicines, herbs, non-prescription drugs, or dietary supplements you use. Also tell them if you smoke, drink alcohol, or use illegal drugs. Some items may interact with your medicine. What should I watch for while using this medicine? Visit your doctor or health care professional regularly. You may need blood work done while you are taking this medicine. You may need to follow a special diet. Talk to your doctor. Limit your alcohol intake and avoid smoking to get the best benefit. What side effects may I notice from receiving this medicine? Side effects that you should report to your doctor or health care professional as soon as possible: -allergic reactions like skin rash, itching or hives, swelling of the face, lips, or tongue -blue tint to skin -chest tightness, pain -difficulty breathing, wheezing -dizziness -red, swollen painful area on the leg Side effects that usually do not require medical attention (report to your doctor or health care professional if they continue or are bothersome): -diarrhea -headache This list may not describe all possible side effects. Call your doctor for medical advice about side effects. You may report side effects to FDA at 1-800-FDA-1088. Where should I keep my medicine? Keep out of the reach of children. Store at room temperature between 15 and 30 degrees C (59 and 85 degrees F). Protect from light. Throw away any unused medicine after the expiration date. NOTE: This sheet is a summary. It may not cover all possible information. If you have questions about this medicine, talk to your doctor, pharmacist, or   health care provider.  2019 Elsevier/Gold Standard (2008-02-03 22:10:20)  

## 2018-10-31 NOTE — Progress Notes (Signed)
Vitamin b12 injection given and patient tolerated well.  

## 2018-11-21 ENCOUNTER — Other Ambulatory Visit: Payer: Self-pay | Admitting: Nurse Practitioner

## 2018-12-02 ENCOUNTER — Ambulatory Visit (INDEPENDENT_AMBULATORY_CARE_PROVIDER_SITE_OTHER): Payer: BLUE CROSS/BLUE SHIELD | Admitting: *Deleted

## 2018-12-02 DIAGNOSIS — E538 Deficiency of other specified B group vitamins: Secondary | ICD-10-CM

## 2018-12-02 NOTE — Progress Notes (Signed)
Pt given Cyanocobalamin inj Tolerated well 

## 2018-12-26 ENCOUNTER — Encounter: Payer: Self-pay | Admitting: Certified Nurse Midwife

## 2018-12-26 DIAGNOSIS — Z1231 Encounter for screening mammogram for malignant neoplasm of breast: Secondary | ICD-10-CM | POA: Diagnosis not present

## 2019-01-03 ENCOUNTER — Ambulatory Visit (INDEPENDENT_AMBULATORY_CARE_PROVIDER_SITE_OTHER): Payer: BLUE CROSS/BLUE SHIELD | Admitting: *Deleted

## 2019-01-03 DIAGNOSIS — E538 Deficiency of other specified B group vitamins: Secondary | ICD-10-CM

## 2019-01-03 NOTE — Progress Notes (Signed)
Pt given Cyanocobalamin inj Tolerated well 

## 2019-01-09 ENCOUNTER — Ambulatory Visit: Payer: BLUE CROSS/BLUE SHIELD | Admitting: Family Medicine

## 2019-01-09 ENCOUNTER — Encounter: Payer: Self-pay | Admitting: Family Medicine

## 2019-01-09 VITALS — BP 158/92 | HR 61 | Temp 98.2°F | Ht 65.5 in | Wt 270.0 lb

## 2019-01-09 DIAGNOSIS — R030 Elevated blood-pressure reading, without diagnosis of hypertension: Secondary | ICD-10-CM | POA: Diagnosis not present

## 2019-01-09 DIAGNOSIS — J0101 Acute recurrent maxillary sinusitis: Secondary | ICD-10-CM

## 2019-01-09 MED ORDER — AZITHROMYCIN 250 MG PO TABS: 250.0000 mg | ORAL_TABLET | Freq: Every day | ORAL | 0 refills | Status: DC

## 2019-01-09 MED ORDER — FLUTICASONE PROPIONATE 50 MCG/ACT NA SUSP: 2.0000 | Freq: Every day | NASAL | 6 refills | Status: DC

## 2019-01-09 NOTE — Patient Instructions (Signed)
Sinusitis, Adult  Sinusitis is inflammation of your sinuses. Sinuses are hollow spaces in the bones around your face. Your sinuses are located:   Around your eyes.   In the middle of your forehead.   Behind your nose.   In your cheekbones.  Mucus normally drains out of your sinuses. When your nasal tissues become inflamed or swollen, mucus can become trapped or blocked. This allows bacteria, viruses, and fungi to grow, which leads to infection. Most infections of the sinuses are caused by a virus.  Sinusitis can develop quickly. It can last for up to 4 weeks (acute) or for more than 12 weeks (chronic). Sinusitis often develops after a cold.  What are the causes?  This condition is caused by anything that creates swelling in the sinuses or stops mucus from draining. This includes:   Allergies.   Asthma.   Infection from bacteria or viruses.   Deformities or blockages in your nose or sinuses.   Abnormal growths in the nose (nasal polyps).   Pollutants, such as chemicals or irritants in the air.   Infection from fungi (rare).  What increases the risk?  You are more likely to develop this condition if you:   Have a weak body defense system (immune system).   Do a lot of swimming or diving.   Overuse nasal sprays.   Smoke.  What are the signs or symptoms?  The main symptoms of this condition are pain and a feeling of pressure around the affected sinuses. Other symptoms include:   Stuffy nose or congestion.   Thick drainage from your nose.   Swelling and warmth over the affected sinuses.   Headache.   Upper toothache.   A cough that may get worse at night.   Extra mucus that collects in the throat or the back of the nose (postnasal drip).   Decreased sense of smell and taste.   Fatigue.   A fever.   Sore throat.   Bad breath.  How is this diagnosed?  This condition is diagnosed based on:   Your symptoms.   Your medical history.   A physical exam.   Tests to find out if your condition is  acute or chronic. This may include:  ? Checking your nose for nasal polyps.  ? Viewing your sinuses using a device that has a light (endoscope).  ? Testing for allergies or bacteria.  ? Imaging tests, such as an MRI or CT scan.  In rare cases, a bone biopsy may be done to rule out more serious types of fungal sinus disease.  How is this treated?  Treatment for sinusitis depends on the cause and whether your condition is chronic or acute.   If caused by a virus, your symptoms should go away on their own within 10 days. You may be given medicines to relieve symptoms. They include:  ? Medicines that shrink swollen nasal passages (topical intranasal decongestants).  ? Medicines that treat allergies (antihistamines).  ? A spray that eases inflammation of the nostrils (topical intranasal corticosteroids).  ? Rinses that help get rid of thick mucus in your nose (nasal saline washes).   If caused by bacteria, your health care provider may recommend waiting to see if your symptoms improve. Most bacterial infections will get better without antibiotic medicine. You may be given antibiotics if you have:  ? A severe infection.  ? A weak immune system.   If caused by narrow nasal passages or nasal polyps, you may need   to have surgery.  Follow these instructions at home:  Medicines   Take, use, or apply over-the-counter and prescription medicines only as told by your health care provider. These may include nasal sprays.   If you were prescribed an antibiotic medicine, take it as told by your health care provider. Do not stop taking the antibiotic even if you start to feel better.  Hydrate and humidify     Drink enough fluid to keep your urine pale yellow. Staying hydrated will help to thin your mucus.   Use a cool mist humidifier to keep the humidity level in your home above 50%.   Inhale steam for 10-15 minutes, 3-4 times a day, or as told by your health care provider. You can do this in the bathroom while a hot shower is  running.   Limit your exposure to cool or dry air.  Rest   Rest as much as possible.   Sleep with your head raised (elevated).   Make sure you get enough sleep each night.  General instructions     Apply a warm, moist washcloth to your face 3-4 times a day or as told by your health care provider. This will help with discomfort.   Wash your hands often with soap and water to reduce your exposure to germs. If soap and water are not available, use hand sanitizer.   Do not smoke. Avoid being around people who are smoking (secondhand smoke).   Keep all follow-up visits as told by your health care provider. This is important.  Contact a health care provider if:   You have a fever.   Your symptoms get worse.   Your symptoms do not improve within 10 days.  Get help right away if:   You have a severe headache.   You have persistent vomiting.   You have severe pain or swelling around your face or eyes.   You have vision problems.   You develop confusion.   Your neck is stiff.   You have trouble breathing.  Summary   Sinusitis is soreness and inflammation of your sinuses. Sinuses are hollow spaces in the bones around your face.   This condition is caused by nasal tissues that become inflamed or swollen. The swelling traps or blocks the flow of mucus. This allows bacteria, viruses, and fungi to grow, which leads to infection.   If you were prescribed an antibiotic medicine, take it as told by your health care provider. Do not stop taking the antibiotic even if you start to feel better.   Keep all follow-up visits as told by your health care provider. This is important.  This information is not intended to replace advice given to you by your health care provider. Make sure you discuss any questions you have with your health care provider.  Document Released: 10/23/2005 Document Revised: 03/25/2018 Document Reviewed: 03/25/2018  Elsevier Interactive Patient Education  2019 [employer]

## 2019-01-09 NOTE — Progress Notes (Signed)
    Subjective:     Renee Thomas is a 48 y.o. female who presents for evaluation of sinus pain. Symptoms include: congestion, cough, facial pain, fevers, frequent clearing of the throat, nasal congestion, post nasal drip, sinus pressure and sore throat. Onset of symptoms was 2 weeks ago. Symptoms have been gradually worsening since that time. Past history is significant for no history of pneumonia or bronchitis. Patient is a non-smoker.  The following portions of the patient's history were reviewed and updated as appropriate: allergies, current medications, past family history, past medical history, past social history, past surgical history and problem list.  Review of Systems Constitutional: positive for chills, fevers and malaise Eyes: negative Ears, nose, mouth, throat, and face: positive for earaches, nasal congestion and sore throat Respiratory: positive for cough Cardiovascular: negative Musculoskeletal:positive for myalgias Neurological: positive for dizziness and headaches   Objective:    BP (!) 158/92   Pulse 61   Temp 98.2 F (36.8 C) (Oral)   Ht 5' 5.5" (1.664 m)   Wt 270 lb (122.5 kg)   BMI 44.25 kg/m  General appearance: alert, cooperative, appears stated age and mild distress Head: Normocephalic, without obvious abnormality, atraumatic Eyes: conjunctivae/corneas clear. PERRL, EOM's intact. Fundi benign. Ears: abnormal TM right ear - serous middle ear fluid and abnormal TM left ear - serous middle ear fluid Nose: scant and yellow discharge, moderate congestion, turbinates red, swollen, moderate maxillary sinus tenderness bilateral, mild frontal sinus tenderness bilateral Throat: abnormal findings: mild oropharyngeal erythema Neck: mild anterior cervical adenopathy, no carotid bruit, no JVD, supple, symmetrical, trachea midline and thyroid not enlarged, symmetric, no tenderness/mass/nodules Lungs: clear to auscultation bilaterally Heart: regular rate and rhythm, S1,  S2 normal, no murmur, click, rub or gallop Skin: Skin color, texture, turgor normal. No rashes or lesions Neurologic: Grossly normal    Assessment:   Renee Thomas was seen today for uri.  Diagnoses and all orders for this visit:  Acute recurrent maxillary sinusitis Symptomatic care discussed. Medications as prescribed. Report any new or worsening symptoms. Can use coricidin HBP if needed for cough and congestion.  -     azithromycin (ZITHROMAX) 250 MG tablet; Take 1 tablet (250 mg total) by mouth daily. -     fluticasone (FLONASE) 50 MCG/ACT nasal spray; Place 2 sprays into both nostrils daily.  Elevated blood pressure reading without diagnosis of hypertension Pt to keep a log of blood pressure readings over the next two weeks and return to office for reevaluation.    Plan:    Nasal saline sprays. Nasal steroids per medication orders. Azithromycin per medication orders. Follow up in 2 weeks or as needed.    The above assessment and management plan was discussed with the patient. The patient verbalized understanding of and has agreed to the management plan. Patient is aware to call the clinic if symptoms fail to improve or worsen. Patient is aware when to return to the clinic for a follow-up visit. Patient educated on when it is appropriate to go to the emergency department.   Kari Baars, FNP-C [facility] [address] [place], [place] [address] [phone]

## 2019-01-23 ENCOUNTER — Encounter: Payer: Self-pay | Admitting: Family Medicine

## 2019-01-23 ENCOUNTER — Ambulatory Visit: Payer: BLUE CROSS/BLUE SHIELD | Admitting: Family Medicine

## 2019-01-23 ENCOUNTER — Other Ambulatory Visit: Payer: Self-pay

## 2019-01-23 VITALS — BP 133/73 | HR 55 | Temp 97.5°F | Ht 65.5 in | Wt 271.0 lb

## 2019-01-23 DIAGNOSIS — R030 Elevated blood-pressure reading, without diagnosis of hypertension: Secondary | ICD-10-CM | POA: Diagnosis not present

## 2019-01-23 NOTE — Patient Instructions (Signed)
Hypertension Hypertension, commonly called high blood pressure, is when the force of blood pumping through the arteries is too strong. The arteries are the blood vessels that carry blood from the heart throughout the body. Hypertension forces the heart to work harder to pump blood and may cause arteries to become narrow or stiff. Having untreated or uncontrolled hypertension can cause heart attacks, strokes, kidney disease, and other problems. A blood pressure reading consists of a higher number over a lower number. Ideally, your blood pressure should be below 120/80. The first ("top") number is called the systolic pressure. It is a measure of the pressure in your arteries as your heart beats. The second ("bottom") number is called the diastolic pressure. It is a measure of the pressure in your arteries as the heart relaxes. What are the causes? The cause of this condition is not known. What increases the risk? Some risk factors for high blood pressure are under your control. Others are not. Factors you can change  Smoking.  Having type 2 diabetes mellitus, high cholesterol, or both.  Not getting enough exercise or physical activity.  Being overweight.  Having too much fat, sugar, calories, or salt (sodium) in your diet.  Drinking too much alcohol. Factors that are difficult or impossible to change  Having chronic kidney disease.  Having a family history of high blood pressure.  Age. Risk increases with age.  Race. You may be at higher risk if you are African-American.  Gender. Men are at higher risk than women before age 45. After age 65, women are at higher risk than men.  Having obstructive sleep apnea.  Stress. What are the signs or symptoms? Extremely high blood pressure (hypertensive crisis) may cause:  Headache.  Anxiety.  Shortness of breath.  Nosebleed.  Nausea and vomiting.  Severe chest pain.  Jerky movements you cannot control (seizures). How is this  diagnosed? This condition is diagnosed by measuring your blood pressure while you are seated, with your arm resting on a surface. The cuff of the blood pressure monitor will be placed directly against the skin of your upper arm at the level of your heart. It should be measured at least twice using the same arm. Certain conditions can cause a difference in blood pressure between your right and left arms. Certain factors can cause blood pressure readings to be lower or higher than normal (elevated) for a short period of time:  When your blood pressure is higher when you are in a health care provider's office than when you are at home, this is called white coat hypertension. Most people with this condition do not need medicines.  When your blood pressure is higher at home than when you are in a health care provider's office, this is called masked hypertension. Most people with this condition may need medicines to control blood pressure. If you have a high blood pressure reading during one visit or you have normal blood pressure with other risk factors:  You may be asked to return on a different day to have your blood pressure checked again.  You may be asked to monitor your blood pressure at home for 1 week or longer. If you are diagnosed with hypertension, you may have other blood or imaging tests to help your health care provider understand your overall risk for other conditions. How is this treated? This condition is treated by making healthy lifestyle changes, such as eating healthy foods, exercising more, and reducing your alcohol intake. Your health care provider   may prescribe medicine if lifestyle changes are not enough to get your blood pressure under control, and if:  Your systolic blood pressure is above 130.  Your diastolic blood pressure is above 80. Your personal target blood pressure may vary depending on your medical conditions, your age, and other factors. Follow these instructions  at home: Eating and drinking   Eat a diet that is high in fiber and potassium, and low in sodium, added sugar, and fat. An example eating plan is called the DASH (Dietary Approaches to Stop Hypertension) diet. To eat this way: ? Eat plenty of fresh fruits and vegetables. Try to fill half of your plate at each meal with fruits and vegetables. ? Eat whole grains, such as whole wheat pasta, brown rice, or whole grain bread. Fill about one quarter of your plate with whole grains. ? Eat or drink low-fat dairy products, such as skim milk or low-fat yogurt. ? Avoid fatty cuts of meat, processed or cured meats, and poultry with skin. Fill about one quarter of your plate with lean proteins, such as fish, chicken without skin, beans, eggs, and tofu. ? Avoid premade and processed foods. These tend to be higher in sodium, added sugar, and fat.  Reduce your daily sodium intake. Most people with hypertension should eat less than 1,500 mg of sodium a day.  Limit alcohol intake to no more than 1 drink a day for nonpregnant women and 2 drinks a day for men. One drink equals 12 oz of beer, 5 oz of wine, or 1 oz of hard liquor. Lifestyle   Work with your health care provider to maintain a healthy body weight or to lose weight. Ask what an ideal weight is for you.  Get at least 30 minutes of exercise that causes your heart to beat faster (aerobic exercise) most days of the week. Activities may include walking, swimming, or biking.  Include exercise to strengthen your muscles (resistance exercise), such as pilates or lifting weights, as part of your weekly exercise routine. Try to do these types of exercises for 30 minutes at least 3 days a week.  Do not use any products that contain nicotine or tobacco, such as cigarettes and e-cigarettes. If you need help quitting, ask your health care provider.  Monitor your blood pressure at home as told by your health care provider.  Keep all follow-up visits as told by  your health care provider. This is important. Medicines  Take over-the-counter and prescription medicines only as told by your health care provider. Follow directions carefully. Blood pressure medicines must be taken as prescribed.  Do not skip doses of blood pressure medicine. Doing this puts you at risk for problems and can make the medicine less effective.  Ask your health care provider about side effects or reactions to medicines that you should watch for. Contact a health care provider if:  You think you are having a reaction to a medicine you are taking.  You have headaches that keep coming back (recurring).  You feel dizzy.  You have swelling in your ankles.  You have trouble with your vision. Get help right away if:  You develop a severe headache or confusion.  You have unusual weakness or numbness.  You feel faint.  You have severe pain in your chest or abdomen.  You vomit repeatedly.  You have trouble breathing. Summary  Hypertension is when the force of blood pumping through your arteries is too strong. If this condition is not controlled, it   may put you at risk for serious complications.  Your personal target blood pressure may vary depending on your medical conditions, your age, and other factors. For most people, a normal blood pressure is less than 120/80.  Hypertension is treated with lifestyle changes, medicines, or a combination of both. Lifestyle changes include weight loss, eating a healthy, low-sodium diet, exercising more, and limiting alcohol. This information is not intended to replace advice given to you by your health care provider. Make sure you discuss any questions you have with your health care provider. Document Released: 10/23/2005 Document Revised: 09/20/2016 Document Reviewed: 09/20/2016 Elsevier Interactive Patient Education  2019 [employer] DASH Eating Plan DASH stands for "Dietary Approaches to Stop Hypertension." The DASH eating  plan is a healthy eating plan that has been shown to reduce high blood pressure (hypertension). It may also reduce your risk for type 2 diabetes, heart disease, and stroke. The DASH eating plan may also help with weight loss. What are tips for following this plan?  General guidelines  Avoid eating more than 2,300 mg (milligrams) of salt (sodium) a day. If you have hypertension, you may need to reduce your sodium intake to 1,500 mg a day.  Limit alcohol intake to no more than 1 drink a day for nonpregnant women and 2 drinks a day for men. One drink equals 12 oz of beer, 5 oz of wine, or 1 oz of hard liquor.  Work with your health care provider to maintain a healthy body weight or to lose weight. Ask what an ideal weight is for you.  Get at least 30 minutes of exercise that causes your heart to beat faster (aerobic exercise) most days of the week. Activities may include walking, swimming, or biking.  Work with your health care provider or diet and nutrition specialist (dietitian) to adjust your eating plan to your individual calorie needs. Reading food labels   Check food labels for the amount of sodium per serving. Choose foods with less than 5 percent of the Daily Value of sodium. Generally, foods with less than 300 mg of sodium per serving fit into this eating plan.  To find whole grains, look for the word "whole" as the first word in the ingredient list. Shopping  Buy products labeled as "low-sodium" or "no salt added."  Buy fresh foods. Avoid canned foods and premade or frozen meals. Cooking  Avoid adding salt when cooking. Use salt-free seasonings or herbs instead of table salt or sea salt. Check with your health care provider or pharmacist before using salt substitutes.  Do not fry foods. Cook foods using healthy methods such as baking, boiling, grilling, and broiling instead.  Cook with heart-healthy oils, such as olive, canola, soybean, or sunflower oil. Meal planning  Eat a  balanced diet that includes: ? 5 or more servings of fruits and vegetables each day. At each meal, try to fill half of your plate with fruits and vegetables. ? Up to 6-8 servings of whole grains each day. ? Less than 6 oz of lean meat, poultry, or fish each day. A 3-oz serving of meat is about the same size as a deck of cards. One egg equals 1 oz. ? 2 servings of low-fat dairy each day. ? A serving of nuts, seeds, or beans 5 times each week. ? Heart-healthy fats. Healthy fats called Omega-3 fatty acids are found in foods such as flaxseeds and coldwater fish, like sardines, salmon, and mackerel.  Limit how much you eat of the following: ?   Canned or prepackaged foods. ? Food that is high in trans fat, such as fried foods. ? Food that is high in saturated fat, such as fatty meat. ? Sweets, desserts, sugary drinks, and other foods with added sugar. ? Full-fat dairy products.  Do not salt foods before eating.  Try to eat at least 2 vegetarian meals each week.  Eat more home-cooked food and less restaurant, buffet, and fast food.  When eating at a restaurant, ask that your food be prepared with less salt or no salt, if possible. What foods are recommended? The items listed may not be a complete list. Talk with your dietitian about what dietary choices are best for you. Grains Whole-grain or whole-wheat bread. Whole-grain or whole-wheat pasta. Brown rice. Oatmeal. Quinoa. Bulgur. Whole-grain and low-sodium cereals. Pita bread. Low-fat, low-sodium crackers. Whole-wheat flour tortillas. Vegetables Fresh or frozen vegetables (raw, steamed, roasted, or grilled). Low-sodium or reduced-sodium tomato and vegetable juice. Low-sodium or reduced-sodium tomato sauce and tomato paste. Low-sodium or reduced-sodium canned vegetables. Fruits All fresh, dried, or frozen fruit. Canned fruit in natural juice (without added sugar). Meat and other protein foods Skinless chicken or turkey. Ground chicken or  turkey. Pork with fat trimmed off. Fish and seafood. Egg whites. Dried beans, peas, or lentils. Unsalted nuts, nut butters, and seeds. Unsalted canned beans. Lean cuts of beef with fat trimmed off. Low-sodium, lean deli meat. Dairy Low-fat (1%) or fat-free (skim) milk. Fat-free, low-fat, or reduced-fat cheeses. Nonfat, low-sodium ricotta or cottage cheese. Low-fat or nonfat yogurt. Low-fat, low-sodium cheese. Fats and oils Soft margarine without trans fats. Vegetable oil. Low-fat, reduced-fat, or light mayonnaise and salad dressings (reduced-sodium). Canola, safflower, olive, soybean, and sunflower oils. Avocado. Seasoning and other foods Herbs. Spices. Seasoning mixes without salt. Unsalted popcorn and pretzels. Fat-free sweets. What foods are not recommended? The items listed may not be a complete list. Talk with your dietitian about what dietary choices are best for you. Grains Baked goods made with fat, such as croissants, muffins, or some breads. Dry pasta or rice meal packs. Vegetables Creamed or fried vegetables. Vegetables in a cheese sauce. Regular canned vegetables (not low-sodium or reduced-sodium). Regular canned tomato sauce and paste (not low-sodium or reduced-sodium). Regular tomato and vegetable juice (not low-sodium or reduced-sodium). Pickles. Olives. Fruits Canned fruit in a light or heavy syrup. Fried fruit. Fruit in cream or butter sauce. Meat and other protein foods Fatty cuts of meat. Ribs. Fried meat. Bacon. Sausage. Bologna and other processed lunch meats. Salami. Fatback. Hotdogs. Bratwurst. Salted nuts and seeds. Canned beans with added salt. Canned or smoked fish. Whole eggs or egg yolks. Chicken or turkey with skin. Dairy Whole or 2% milk, cream, and half-and-half. Whole or full-fat cream cheese. Whole-fat or sweetened yogurt. Full-fat cheese. Nondairy creamers. Whipped toppings. Processed cheese and cheese spreads. Fats and oils Butter. Stick margarine. Lard.  Shortening. Ghee. Bacon fat. Tropical oils, such as coconut, palm kernel, or palm oil. Seasoning and other foods Salted popcorn and pretzels. Onion salt, garlic salt, seasoned salt, table salt, and sea salt. Worcestershire sauce. Tartar sauce. Barbecue sauce. Teriyaki sauce. Soy sauce, including reduced-sodium. Steak sauce. Canned and packaged gravies. Fish sauce. Oyster sauce. Cocktail sauce. Horseradish that you find on the shelf. Ketchup. Mustard. Meat flavorings and tenderizers. Bouillon cubes. Hot sauce and Tabasco sauce. Premade or packaged marinades. Premade or packaged taco seasonings. Relishes. Regular salad dressings. Where to find more information:  National Heart, Lung, and Blood Institute: www.nhlbi.nih.gov  American Heart Association: www.heart.org Summary    The DASH eating plan is a healthy eating plan that has been shown to reduce high blood pressure (hypertension). It may also reduce your risk for type 2 diabetes, heart disease, and stroke.  With the DASH eating plan, you should limit salt (sodium) intake to 2,300 mg a day. If you have hypertension, you may need to reduce your sodium intake to 1,500 mg a day.  When on the DASH eating plan, aim to eat more fresh fruits and vegetables, whole grains, lean proteins, low-fat dairy, and heart-healthy fats.  Work with your health care provider or diet and nutrition specialist (dietitian) to adjust your eating plan to your individual calorie needs. This information is not intended to replace advice given to you by your health care provider. Make sure you discuss any questions you have with your health care provider. Document Released: 10/12/2011 Document Revised: 10/16/2016 Document Reviewed: 10/16/2016 Elsevier Interactive Patient Education  2019 Elsevier Inc.  

## 2019-01-23 NOTE — Progress Notes (Signed)
    Subjective:    Patient here for follow-up of elevated blood pressure.  She is exercising and is adherent to a low-salt diet.  Blood pressure is well controlled at home. Cardiac symptoms: none. Patient denies: chest pain, claudication, dyspnea, exertional chest pressure/discomfort, fatigue, lower extremity edema, near-syncope, orthopnea, palpitations, paroxysmal nocturnal dyspnea, syncope and tachypnea. Cardiovascular risk factors: family history of premature cardiovascular disease and obesity (BMI >= 30 kg/m2). Use of agents associated with hypertension: none. History of target organ damage: none.  The following portions of the patient's history were reviewed and updated as appropriate: allergies, current medications, past family history, past medical history, past social history, past surgical history and problem list.  Review of Systems Constitutional: negative Eyes: negative Ears, nose, mouth, throat, and face: negative Respiratory: negative Cardiovascular: positive for palpitations and history of PVCs Musculoskeletal:negative Neurological: negative     Objective:    BP 133/73   Pulse (!) 55   Temp (!) 97.5 F (36.4 C) (Oral)   Ht 5' 5.5" (1.664 m)   Wt 271 lb (122.9 kg)   BMI 44.41 kg/m  General appearance: alert, cooperative, appears stated age, no distress and morbidly obese Head: Normocephalic, without obvious abnormality, atraumatic Eyes: conjunctivae/corneas clear. PERRL, EOM's intact. Fundi benign. Ears: normal TM's and external ear canals both ears Nose: Nares normal. Septum midline. Mucosa normal. No drainage or sinus tenderness. Throat: lips, mucosa, and tongue normal; teeth and gums normal Neck: no adenopathy, no carotid bruit, no JVD, supple, symmetrical, trachea midline and thyroid not enlarged, symmetric, no tenderness/mass/nodules Cardiovascular: RRR, normal S1/S2, no murmur, rub, or gallop. No LE edema.  Lungs: clear to auscultation bilaterally Skin: Skin  color, texture, turgor normal. No rashes or lesions Neurologic: Grossly normal    Assessment:   Renee Thomas was seen today for hypertension.  Diagnoses and all orders for this visit:  Elevated blood pressure reading Prehypertension. DASH diet discussed. Diet and exercise encouraged. Normal BP readings at home and in office today. Will continue to monitor. Report any persistent highs.    Plan:    Dietary sodium restriction. Check blood pressures daily and record. Follow up: 3 months and as needed.    The above assessment and management plan was discussed with the patient. The patient verbalized understanding of and has agreed to the management plan. Patient is aware to call the clinic if symptoms fail to improve or worsen. Patient is aware when to return to the clinic for a follow-up visit. Patient educated on when it is appropriate to go to the emergency department.   Renee Baars, FNP-C [facility] [address] [place], [place] [address] [phone]

## 2019-01-31 ENCOUNTER — Other Ambulatory Visit: Payer: Self-pay

## 2019-02-03 ENCOUNTER — Ambulatory Visit: Payer: BLUE CROSS/BLUE SHIELD

## 2019-02-05 ENCOUNTER — Ambulatory Visit (INDEPENDENT_AMBULATORY_CARE_PROVIDER_SITE_OTHER): Payer: BLUE CROSS/BLUE SHIELD | Admitting: *Deleted

## 2019-02-05 ENCOUNTER — Other Ambulatory Visit: Payer: Self-pay

## 2019-02-05 DIAGNOSIS — E538 Deficiency of other specified B group vitamins: Secondary | ICD-10-CM

## 2019-02-05 NOTE — Progress Notes (Signed)
Pt given Cyanocobalamin inj Tolerated well 

## 2019-02-19 ENCOUNTER — Other Ambulatory Visit: Payer: Self-pay | Admitting: Nurse Practitioner

## 2019-02-19 NOTE — Telephone Encounter (Signed)
OV 05/02/19

## 2019-03-07 ENCOUNTER — Ambulatory Visit (INDEPENDENT_AMBULATORY_CARE_PROVIDER_SITE_OTHER): Payer: BLUE CROSS/BLUE SHIELD | Admitting: *Deleted

## 2019-03-07 ENCOUNTER — Other Ambulatory Visit: Payer: Self-pay

## 2019-03-07 DIAGNOSIS — E538 Deficiency of other specified B group vitamins: Secondary | ICD-10-CM

## 2019-03-07 NOTE — Progress Notes (Signed)
Pt given Cyanocobalamin inj Tolerated well 

## 2019-04-07 ENCOUNTER — Ambulatory Visit (INDEPENDENT_AMBULATORY_CARE_PROVIDER_SITE_OTHER): Payer: BLUE CROSS/BLUE SHIELD | Admitting: *Deleted

## 2019-04-07 ENCOUNTER — Other Ambulatory Visit: Payer: Self-pay

## 2019-04-07 DIAGNOSIS — E538 Deficiency of other specified B group vitamins: Secondary | ICD-10-CM | POA: Diagnosis not present

## 2019-04-07 NOTE — Progress Notes (Signed)
Pt given Cyanocobalamin inj Tolerated well 

## 2019-05-01 ENCOUNTER — Telehealth: Payer: Self-pay | Admitting: Nurse Practitioner

## 2019-05-01 ENCOUNTER — Other Ambulatory Visit: Payer: Self-pay

## 2019-05-02 ENCOUNTER — Encounter: Payer: Self-pay | Admitting: Nurse Practitioner

## 2019-05-02 ENCOUNTER — Ambulatory Visit: Payer: BLUE CROSS/BLUE SHIELD | Admitting: Nurse Practitioner

## 2019-05-02 VITALS — BP 136/93 | HR 73 | Temp 97.9°F | Ht 65.0 in | Wt 272.0 lb

## 2019-05-02 DIAGNOSIS — E538 Deficiency of other specified B group vitamins: Secondary | ICD-10-CM | POA: Diagnosis not present

## 2019-05-02 DIAGNOSIS — K219 Gastro-esophageal reflux disease without esophagitis: Secondary | ICD-10-CM | POA: Diagnosis not present

## 2019-05-02 DIAGNOSIS — Z6841 Body Mass Index (BMI) 40.0 and over, adult: Secondary | ICD-10-CM | POA: Diagnosis not present

## 2019-05-02 MED ORDER — OMEPRAZOLE 20 MG PO CPDR: DELAYED_RELEASE_CAPSULE | ORAL | 1 refills | Status: DC

## 2019-05-02 NOTE — Patient Instructions (Signed)

## 2019-05-02 NOTE — Progress Notes (Signed)
Subjective:    Patient ID: Renee Thomas, female    DOB: [birth year], 48 y.o.   MRN: [id number]   Chief Complaint: Medical Management of Chronic Issues (? cyst on back)    HPI:  1. Low serum vitamin B12 Takes b12 injections monthly here at [facility]. Denies fatigue.  2. GERD without esophagitis Is on omeprazole daily- works well to keep symptoms under control  3. BMI 45.0-49.9 No recent weight chnages   Outpatient Encounter Medications as of 05/02/2019  Medication Sig  . Carboxymethylcellul-Glycerin (CLEAR EYES FOR DRY EYES OP) Place 1-2 drops into both eyes daily as needed.  . cetirizine (ZYRTEC) 10 MG tablet Take 10 mg by mouth daily.  . Cholecalciferol (VITAMIN D PO) Take by mouth daily.  . cyanocobalamin (,VITAMIN B-12,) 1000 MCG/ML injection Inject 1,000 mcg into the muscle every 30 (thirty) days.  . fluticasone (FLONASE) 50 MCG/ACT nasal spray Place 2 sprays into both nostrils daily.  Marland Kitchen KRILL OIL PO Take 350 mg by mouth daily.   Marland Kitchen omeprazole (PRILOSEC) 20 MG capsule TAKE 1 CAPSULE BY MOUTH EVERY DAY  . Probiotic Product (TRUBIOTICS PO) Take by mouth daily.   History reviewed. No pertinent surgical history.  Family History  Problem Relation Age of Onset  . Hypertension Mother   . Diabetes Mother   . Other Mother        renal disease  . Hypertension Father   . Heart attack Father   . Hypertension Sister   . Diabetes Brother   . Diabetes Brother     New complaints: Cyst on back. Growing slowly. Denies pain.o drainageli  Social history: lives with sister-     Review of Systems  Constitutional: Negative for activity change and appetite change.  HENT: Negative.   Eyes: Negative for pain.  Respiratory: Negative for shortness of breath.   Cardiovascular: Negative for chest pain, palpitations and leg swelling.  Gastrointestinal: Negative for abdominal pain.  Endocrine: Negative for polydipsia.  Genitourinary: Negative.   Skin: Negative for rash.       Lesion  on back  Neurological: Negative for dizziness, weakness and headaches.  Hematological: Does not bruise/bleed easily.  Psychiatric/Behavioral: Negative.   All other systems reviewed and are negative.      Objective:   Physical Exam Vitals signs and nursing note reviewed.  Constitutional:      General: She is not in acute distress.    Appearance: Normal appearance. She is well-developed.  HENT:     Head: Normocephalic.     Nose: Nose normal.  Eyes:     Pupils: Pupils are equal, round, and reactive to light.  Neck:     Musculoskeletal: Normal range of motion and neck supple.     Vascular: No carotid bruit or JVD.  Cardiovascular:     Rate and Rhythm: Normal rate and regular rhythm.     Heart sounds: Normal heart sounds.  Pulmonary:     Effort: Pulmonary effort is normal. No respiratory distress.     Breath sounds: Normal breath sounds. No wheezing or rales.  Chest:     Chest wall: No tenderness.  Abdominal:     General: Bowel sounds are normal. There is no distension or abdominal bruit.     Palpations: Abdomen is soft. There is no hepatomegaly, splenomegaly, mass or pulsatile mass.     Tenderness: There is no abdominal tenderness.  Musculoskeletal: Normal range of motion.  Lymphadenopathy:     Cervical: No cervical adenopathy.  Skin:  General: Skin is warm and dry.     Comments: 2cm moveable nontender nodule mid upper back  Neurological:     Mental Status: She is alert and oriented to person, place, and time.     Deep Tendon Reflexes: Reflexes are normal and symmetric.  Psychiatric:        Behavior: Behavior normal.        Thought Content: Thought content normal.        Judgment: Judgment normal.    BP (!) 136/93   Pulse 73   Temp 97.9 F (36.6 C) (Oral)   Ht '5\' 5"'  (1.651 m)   Wt 272 lb (123.4 kg)   BMI 45.26 kg/m         Assessment & Plan:  Renee Thomas comes in today with chief complaint of Medical Management of Chronic Issues (? cyst on back)    Diagnosis and orders addressed:  1. Low serum vitamin B12 Continue b12 injections  2. GERD without esophagitis Avoid spicy foods Do not eat 2 hours prior to bedtime - omeprazole (PRILOSEC) 20 MG capsule; TAKE 1 CAPSULE BY MOUTH EVERY DAY  Dispense: 90 capsule; Refill: 1  3. BMI 45.0-45.9 Discussed diet and exercise for person with BMI >25 Will recheck weight in 3-6 months  Labs pending Health Maintenance reviewed Diet and exercise encouraged  Orders Placed This Encounter  Procedures  . CMP14+EGFR  . Lipid panel    Follow up plan: 6 months    [name] Hassell Done, FNP

## 2019-05-03 LAB — CMP14+EGFR
ALT: 16 IU/L (ref 0–32)
AST: 14 IU/L (ref 0–40)
Albumin/Globulin Ratio: 1.5 (ref 1.2–2.2)
Albumin: 4.5 g/dL (ref 3.8–4.8)
Alkaline Phosphatase: 106 IU/L (ref 39–117)
BUN/Creatinine Ratio: 25 — ABNORMAL HIGH (ref 9–23)
BUN: 19 mg/dL (ref 6–24)
Bilirubin Total: 0.3 mg/dL (ref 0.0–1.2)
CO2: 23 mmol/L (ref 20–29)
Calcium: 10.3 mg/dL — ABNORMAL HIGH (ref 8.7–10.2)
Chloride: 104 mmol/L (ref 96–106)
Creatinine, Ser: 0.76 mg/dL (ref 0.57–1.00)
GFR calc Af Amer: 106 mL/min/{1.73_m2} (ref >59)
GFR calc non Af Amer: 92 mL/min/{1.73_m2} (ref >59)
Globulin, Total: 3.1 g/dL (ref 1.5–4.5)
Glucose: 93 mg/dL (ref 65–99)
Potassium: 4.6 mmol/L (ref 3.5–5.2)
Sodium: 140 mmol/L (ref 134–144)
Total Protein: 7.6 g/dL (ref 6.0–8.5)

## 2019-05-03 LAB — LIPID PANEL
Chol/HDL Ratio: 2.8 ratio (ref 0.0–4.4)
Cholesterol, Total: 193 mg/dL (ref 100–199)
HDL: 70 mg/dL (ref >39)
LDL Calculated: 112 mg/dL — ABNORMAL HIGH (ref 0–99)
Triglycerides: 53 mg/dL (ref 0–149)
VLDL Cholesterol Cal: 11 mg/dL (ref 5–40)

## 2019-05-07 ENCOUNTER — Other Ambulatory Visit: Payer: Self-pay

## 2019-05-07 ENCOUNTER — Ambulatory Visit (INDEPENDENT_AMBULATORY_CARE_PROVIDER_SITE_OTHER): Payer: BC Managed Care – PPO | Admitting: *Deleted

## 2019-05-07 DIAGNOSIS — E538 Deficiency of other specified B group vitamins: Secondary | ICD-10-CM | POA: Diagnosis not present

## 2019-05-07 NOTE — Progress Notes (Signed)
Pt given Cyanocobalamin inj Tolerated well 

## 2019-06-06 ENCOUNTER — Other Ambulatory Visit: Payer: Self-pay

## 2019-06-09 ENCOUNTER — Telehealth: Payer: Self-pay | Admitting: Nurse Practitioner

## 2019-06-09 ENCOUNTER — Ambulatory Visit: Payer: BC Managed Care – PPO

## 2019-06-09 ENCOUNTER — Other Ambulatory Visit: Payer: Self-pay

## 2019-06-10 ENCOUNTER — Ambulatory Visit (INDEPENDENT_AMBULATORY_CARE_PROVIDER_SITE_OTHER): Payer: BC Managed Care – PPO | Admitting: *Deleted

## 2019-06-10 DIAGNOSIS — E538 Deficiency of other specified B group vitamins: Secondary | ICD-10-CM

## 2019-06-10 NOTE — Progress Notes (Signed)
Pt given Cyanocobalamin inj Tolerated well 

## 2019-07-04 ENCOUNTER — Other Ambulatory Visit: Payer: Self-pay | Admitting: Family Medicine

## 2019-07-04 DIAGNOSIS — J0101 Acute recurrent maxillary sinusitis: Secondary | ICD-10-CM

## 2019-07-10 ENCOUNTER — Other Ambulatory Visit: Payer: Self-pay

## 2019-07-11 ENCOUNTER — Ambulatory Visit (INDEPENDENT_AMBULATORY_CARE_PROVIDER_SITE_OTHER): Payer: BC Managed Care – PPO | Admitting: *Deleted

## 2019-07-11 DIAGNOSIS — E538 Deficiency of other specified B group vitamins: Secondary | ICD-10-CM | POA: Diagnosis not present

## 2019-07-11 NOTE — Progress Notes (Signed)
Pt given Cyanocobalamin inj Tolerated well 

## 2019-08-06 ENCOUNTER — Ambulatory Visit: Payer: BC Managed Care – PPO | Admitting: Family Medicine

## 2019-08-06 ENCOUNTER — Encounter: Payer: Self-pay | Admitting: Family Medicine

## 2019-08-06 ENCOUNTER — Ambulatory Visit (INDEPENDENT_AMBULATORY_CARE_PROVIDER_SITE_OTHER): Payer: BC Managed Care – PPO

## 2019-08-06 ENCOUNTER — Other Ambulatory Visit: Payer: Self-pay

## 2019-08-06 VITALS — BP 92/67 | HR 73 | Temp 97.5°F | Resp 20 | Ht 65.0 in | Wt 275.0 lb

## 2019-08-06 DIAGNOSIS — M5441 Lumbago with sciatica, right side: Secondary | ICD-10-CM

## 2019-08-06 DIAGNOSIS — M5136 Other intervertebral disc degeneration, lumbar region: Secondary | ICD-10-CM | POA: Diagnosis not present

## 2019-08-06 DIAGNOSIS — M25561 Pain in right knee: Secondary | ICD-10-CM

## 2019-08-06 DIAGNOSIS — M48061 Spinal stenosis, lumbar region without neurogenic claudication: Secondary | ICD-10-CM | POA: Diagnosis not present

## 2019-08-06 MED ORDER — PREDNISONE 20 MG PO TABS: ORAL_TABLET | ORAL | 0 refills | Status: DC

## 2019-08-06 MED ORDER — NAPROXEN 500 MG PO TABS: 500.0000 mg | ORAL_TABLET | Freq: Two times a day (BID) | ORAL | 0 refills | Status: AC

## 2019-08-06 NOTE — Patient Instructions (Signed)
Knee Arthrocentesis, Care After This sheet gives you information about how to care for yourself after your procedure. Your health care provider may also give you more specific instructions. If you have problems or questions, contact your health care provider. What can I expect after the procedure? After the procedure, it is common to have:  Bruising.  Swelling.  Soreness. Follow these instructions at home: Managing pain, stiffness, and swelling   If directed, put ice on your knee: ? Put ice in a plastic bag. ? Place a towel between your skin and the bag. ? Leave the ice on for 20 minutes, 2-3 times a day.  Do not put heat on your knee.  Wrap your knee to help prevent swelling as told by your health care provider. Ask your health care provider to show you how to wrap your knee.  Move your toes often to avoid stiffness and to lessen swelling.  Raise (elevate) your knee above the level of your heart while you are sitting or lying down. To do this, try putting a few pillows under your knee and lower leg. Bathing  Do not take baths, swim, or use a hot tub until your health care provider approves. Ask your health care provider if you may take showers. Activity  Ask your health care provider what activities are safe for you during recovery, and ask what activities you need to avoid. In some cases, you may be able to return to normal activities right away. Driving   Do not drive until your health care provider approves. You should not drive for 24 hours if you were given a medicine to help you relax (sedative) during your procedure.  Do not drive or use heavy machinery while taking prescription pain medicine. General instructions  Take over-the-counter and prescription medicines only as told by your health care provider.  Check your puncture site every day for signs of infection. Check for: ? Redness, swelling, or pain. ? Fluid or blood. ? Warmth. ? Pus or a bad smell.  Change  your bandage (dressing) as told by your health care provider. Make sure you wash your hands with soap and water before you change your dressing. If you cannot use soap and water, use hand sanitizer.  Keep all follow-up visits as told by your health care provider. This is important. Contact a health care provider if you have:  Swelling or stiffness that: ? Gets worse. ? Becomes severe. ? Does not get better with medicine.  A fever.  Any of the following around your puncture site: ? Redness, swelling, or pain. ? Fluid or blood draining. ? Unusual warmth. ? Pus or a bad smell. Get help right away if you have:  Shortness of breath.  Severe pain.  Pain that does not get better with medicine. Summary  It is common to have mild soreness for a couple of days after this procedure.  Talk with your health care provider about how to use ice and wraps to prevent swelling.  Get help right away if you feel short of breath or have pain that does not get better with medicine. This information is not intended to replace advice given to you by your health care provider. Make sure you discuss any questions you have with your health care provider. Document Released: 11/13/2014 Document Revised: 08/15/2018 Document Reviewed: 10/09/2017 Elsevier Patient Education  2020 [employer] Sciatica  Sciatica is pain, weakness, tingling, or loss of feeling (numbness) along the sciatic nerve. The sciatic nerve starts in  the lower back and goes down the back of each leg. Sciatica usually goes away on its own or with treatment. Sometimes, sciatica may come back (recur). What are the causes? This condition happens when the sciatic nerve is pinched or has pressure put on it. This may be the result of:  A disk in between the bones of the spine bulging out too far (herniated disk).  Changes in the spinal disks that occur with aging.  A condition that affects a muscle in the butt.  Extra bone growth near the  sciatic nerve.  A break (fracture) of the area between your hip bones (pelvis).  Pregnancy.  Tumor. This is rare. What increases the risk? You are more likely to develop this condition if you:  Play sports that put pressure or stress on the spine.  Have poor strength and ease of movement (flexibility).  Have had a back injury in the past.  Have had back surgery.  Sit for long periods of time.  Do activities that involve bending or lifting over and over again.  Are very overweight (obese). What are the signs or symptoms? Symptoms can vary from mild to very bad. They may include:  Any of these problems in the lower back, leg, hip, or butt: ? Mild tingling, loss of feeling, or dull aches. ? Burning sensations. ? Sharp pains.  Loss of feeling in the back of the calf or the sole of the foot.  Leg weakness.  Very bad back pain that makes it hard to move. These symptoms may get worse when you cough, sneeze, or laugh. They may also get worse when you sit or stand for long periods of time. How is this treated? This condition often gets better without any treatment. However, treatment may include:  Changing or cutting back on physical activity when you have pain.  Doing exercises and stretching.  Putting ice or heat on the affected area.  Medicines that help: ? To relieve pain and swelling. ? To relax your muscles.  Shots (injections) of medicines that help to relieve pain, irritation, and swelling.  Surgery. Follow these instructions at home: Medicines  Take over-the-counter and prescription medicines only as told by your doctor.  Ask your doctor if the medicine prescribed to you: ? Requires you to avoid driving or using heavy machinery. ? Can cause trouble pooping (constipation). You may need to take these steps to prevent or treat trouble pooping:  Drink enough fluids to keep your pee (urine) pale yellow.  Take over-the-counter or prescription medicines.   Eat foods that are high in fiber. These include beans, whole grains, and fresh fruits and vegetables.  Limit foods that are high in fat and sugar. These include fried or sweet foods. Managing pain      If told, put ice on the affected area. ? Put ice in a plastic bag. ? Place a towel between your skin and the bag. ? Leave the ice on for 20 minutes, 2-3 times a day.  If told, put heat on the affected area. Use the heat source that your doctor tells you to use, such as a moist heat pack or a heating pad. ? Place a towel between your skin and the heat source. ? Leave the heat on for 20-30 minutes. ? Remove the heat if your skin turns bright red. This is very important if you are unable to feel pain, heat, or cold. You may have a greater risk of getting burned. Activity   Return  to your normal activities as told by your doctor. Ask your doctor what activities are safe for you.  Avoid activities that make your symptoms worse.  Take short rests during the day. ? When you rest for a long time, do some physical activity or stretching between periods of rest. ? Avoid sitting for a long time without moving. Get up and move around at least one time each hour.  Exercise and stretch regularly, as told by your doctor.  Do not lift anything that is heavier than 10 lb (4.5 kg) while you have symptoms of sciatica. ? Avoid lifting heavy things even when you do not have symptoms. ? Avoid lifting heavy things over and over.  When you lift objects, always lift in a way that is safe for your body. To do this, you should: ? Bend your knees. ? Keep the object close to your body. ? Avoid twisting. General instructions  Stay at a healthy weight.  Wear comfortable shoes that support your feet. Avoid wearing high heels.  Avoid sleeping on a mattress that is too soft or too hard. You might have less pain if you sleep on a mattress that is firm enough to support your back.  Keep all follow-up visits  as told by your doctor. This is important. Contact a doctor if:  You have pain that: ? Wakes you up when you are sleeping. ? Gets worse when you lie down. ? Is worse than the pain you have had in the past. ? Lasts longer than 4 weeks.  You lose weight without trying. Get help right away if:  You cannot control when you pee (urinate) or poop (have a bowel movement).  You have weakness in any of these areas and it gets worse: ? Lower back. ? The area between your hip bones. ? Butt. ? Legs.  You have redness or swelling of your back.  You have a burning feeling when you pee. Summary  Sciatica is pain, weakness, tingling, or loss of feeling (numbness) along the sciatic nerve.  This condition happens when the sciatic nerve is pinched or has pressure put on it.  Sciatica can cause pain, tingling, or loss of feeling (numbness) in the lower back, legs, hips, and butt.  Treatment often includes rest, exercise, medicines, and putting ice or heat on the affected area. This information is not intended to replace advice given to you by your health care provider. Make sure you discuss any questions you have with your health care provider. Document Released: 08/01/2008 Document Revised: 11/11/2018 Document Reviewed: 11/11/2018 Elsevier Patient Education  2020 [employer].

## 2019-08-06 NOTE — Progress Notes (Signed)
Subjective:  Patient ID: Renee Thomas, female    DOB: 1971/07/19, 47 y.o.   MRN: [id number]  Patient Care Team: Chevis Pretty, FNP as PCP - General (Family Medicine)   Chief Complaint:  right hip and knee pain   HPI: Renee Thomas is a 48 y.o. female presenting on 08/06/2019 for right hip and knee pain   Pt presents today with lower back pain and right knee pain. No known injury.   Back Pain This is a new problem. The current episode started more than 1 month ago. The problem occurs intermittently. The problem has been waxing and waning since onset. The pain is present in the gluteal and sacro-iliac. The quality of the pain is described as aching, stabbing and shooting. The pain radiates to the right thigh. The pain is at a severity of 5/10. The pain is moderate. The pain is worse during the day. The symptoms are aggravated by twisting, stress, standing, position and bending. Associated symptoms include leg pain. Pertinent negatives include no abdominal pain, bladder incontinence, bowel incontinence, chest pain, dysuria, fever, headaches, numbness, paresis, paresthesias, pelvic pain, perianal numbness, tingling, weakness or weight loss. She has tried analgesics and bed rest for the symptoms. The treatment provided moderate relief.  Knee Pain  The incident occurred more than 1 week ago. There was no injury mechanism. The pain is present in the right knee. The pain is at a severity of 3/10. The pain is mild. The pain has been fluctuating since onset. Pertinent negatives include no inability to bear weight, loss of motion, loss of sensation, muscle weakness, numbness or tingling. She reports no foreign bodies present. The symptoms are aggravated by movement and weight bearing (going up stairs). She has tried acetaminophen for the symptoms. The treatment provided moderate relief.     Relevant past medical, surgical, family, and social history reviewed and updated as indicated.   Allergies and medications reviewed and updated. Date reviewed: Chart in Epic.   Past Medical History:  Diagnosis Date  . Anemia   . GERD (gastroesophageal reflux disease)   . MVP (mitral valve prolapse)   . PVC's (premature ventricular contractions)     History reviewed. No pertinent surgical history.  Social History   Socioeconomic History  . Marital status: Single    Spouse name: Not on file  . Number of children: Not on file  . Years of education: Not on file  . Highest education level: Not on file  Occupational History  . Not on file  Social Needs  . Financial resource strain: Not on file  . Food insecurity    Worry: Not on file    Inability: Not on file  . Transportation needs    Medical: Not on file    Non-medical: Not on file  Tobacco Use  . Smoking status: Never Smoker  . Smokeless tobacco: Never Used  Substance and Sexual Activity  . Alcohol use: No    Alcohol/week: 0.0 standard drinks  . Drug use: No  . Sexual activity: Not Currently    Partners: Male    Birth control/protection: Abstinence  Lifestyle  . Physical activity    Days per week: Not on file    Minutes per session: Not on file  . Stress: Not on file  Relationships  . Social Herbalist on phone: Not on file    Gets together: Not on file    Attends religious service: Not on file  Active member of club or organization: Not on file    Attends meetings of clubs or organizations: Not on file    Relationship status: Not on file  . Intimate partner violence    Fear of current or ex partner: Not on file    Emotionally abused: Not on file    Physically abused: Not on file    Forced sexual activity: Not on file  Other Topics Concern  . Not on file  Social History Narrative  . Not on file    Outpatient Encounter Medications as of 08/06/2019  Medication Sig  . Carboxymethylcellul-Glycerin (CLEAR EYES FOR DRY EYES OP) Place 1-2 drops into both eyes daily as needed.  . cetirizine  (ZYRTEC) 10 MG tablet Take 10 mg by mouth daily.  . Cholecalciferol (VITAMIN D PO) Take by mouth daily.  . cyanocobalamin (,VITAMIN B-12,) 1000 MCG/ML injection Inject 1,000 mcg into the muscle every 30 (thirty) days.  . fluticasone (FLONASE) 50 MCG/ACT nasal spray SPRAY 2 SPRAYS INTO EACH NOSTRIL EVERY DAY  . KRILL OIL PO Take 350 mg by mouth daily.   Marland Kitchen omeprazole (PRILOSEC) 20 MG capsule TAKE 1 CAPSULE BY MOUTH EVERY DAY  . Probiotic Product (TRUBIOTICS PO) Take by mouth daily.  . naproxen (NAPROSYN) 500 MG tablet Take 1 tablet (500 mg total) by mouth 2 (two) times daily with a meal for 14 days.  . predniSONE (DELTASONE) 20 MG tablet 2 po at sametime daily for 5 days   Facility-Administered Encounter Medications as of 08/06/2019  Medication  . cyanocobalamin ((VITAMIN B-12)) injection 1,000 mcg    Allergies  Allergen Reactions  . Other Anaphylaxis    All Tree-Nuts  . Peanut-Containing Drug Products Anaphylaxis  . Eggs Or Egg-Derived Products     Swells mouth with itching  . Fluarix [Flu Virus Vaccine]     Contains egg protein.  Marland Kitchen Amoxicillin Rash  . Penicillins Rash    .Has patient had a PCN reaction causing immediate rash, facial/tongue/throat swelling, SOB or lightheadedness with hypotension: Yes Has patient had a PCN reaction causing severe rash involving mucus membranes or skin necrosis: No Has patient had a PCN reaction that required hospitalization: no Has patient had a PCN reaction occurring within the last 10 years: no If all of the above answers are "NO", then may proceed with Cephalosporin use.   . Sulfa Antibiotics Rash    numbness    Review of Systems  Constitutional: Negative for activity change, appetite change, chills, diaphoresis, fatigue, fever, unexpected weight change and weight loss.  HENT: Negative.   Eyes: Negative.   Respiratory: Negative for cough, chest tightness and shortness of breath.   Cardiovascular: Negative for chest pain, palpitations and leg  swelling.  Gastrointestinal: Negative for abdominal pain, blood in stool, bowel incontinence, constipation, diarrhea, nausea and vomiting.  Endocrine: Negative.   Genitourinary: Negative for bladder incontinence, decreased urine volume, difficulty urinating, dysuria, frequency, pelvic pain and urgency.  Musculoskeletal: Positive for arthralgias, back pain, gait problem and myalgias. Negative for joint swelling, neck pain and neck stiffness.  Skin: Negative.   Allergic/Immunologic: Negative.   Neurological: Negative for dizziness, tingling, tremors, weakness, numbness, headaches and paresthesias.  Hematological: Negative.   Psychiatric/Behavioral: Negative for confusion, hallucinations, sleep disturbance and suicidal ideas.  All other systems reviewed and are negative.       Objective:  BP 92/67   Pulse 73   Temp (!) 97.5 F (36.4 C)   Resp 20   Ht _0  (1.651 m)  Wt 275 lb (124.7 kg)   LMP 01/05/2019   SpO2 100%   BMI 45.76 kg/m    Wt Readings from Last 3 Encounters:  08/06/19 275 lb (124.7 kg)  05/02/19 272 lb (123.4 kg)  01/23/19 271 lb (122.9 kg)    Physical Exam Vitals signs and nursing note reviewed.  Constitutional:      General: She is not in acute distress.    Appearance: Normal appearance. She is well-developed and well-groomed. She is not ill-appearing, toxic-appearing or diaphoretic.  HENT:     Head: Normocephalic and atraumatic.     Jaw: There is normal jaw occlusion.     Right Ear: Hearing normal.     Left Ear: Hearing normal.     Nose: Nose normal.     Mouth/Throat:     Lips: Pink.     Mouth: Mucous membranes are moist.     Pharynx: Oropharynx is clear. Uvula midline.  Eyes:     General: Lids are normal.     Extraocular Movements: Extraocular movements intact.     Conjunctiva/sclera: Conjunctivae normal.     Pupils: Pupils are equal, round, and reactive to light.  Neck:     Musculoskeletal: Normal range of motion and neck supple.     Thyroid: No  thyroid mass, thyromegaly or thyroid tenderness.     Vascular: No carotid bruit or JVD.     Trachea: Trachea and phonation normal.  Cardiovascular:     Rate and Rhythm: Normal rate and regular rhythm.     Chest Wall: PMI is not displaced.     Pulses: Normal pulses.     Heart sounds: Normal heart sounds. No murmur. No friction rub. No gallop.   Pulmonary:     Effort: Pulmonary effort is normal. No respiratory distress.     Breath sounds: Normal breath sounds. No wheezing.  Abdominal:     General: Bowel sounds are normal. There is no distension or abdominal bruit.     Palpations: Abdomen is soft. There is no hepatomegaly or splenomegaly.     Tenderness: There is no abdominal tenderness. There is no right CVA tenderness or left CVA tenderness.     Hernia: No hernia is present.  Musculoskeletal:     Right hip: Normal. She exhibits normal range of motion, normal strength, no tenderness, no bony tenderness, no swelling, no crepitus, no deformity and no laceration.     Left hip: Normal.     Right knee: She exhibits swelling and bony tenderness. She exhibits normal range of motion, no effusion, no ecchymosis, no deformity, no laceration, no erythema, normal alignment, no LCL laxity, normal patellar mobility, normal meniscus and no MCL laxity. Tenderness found. Lateral joint line tenderness noted. No medial joint line, no MCL, no LCL and no patellar tendon tenderness noted.       Back:     Right lower leg: No edema.     Left lower leg: No edema.     Comments: Right straight leg raise test positive at 35 degrees.   Lymphadenopathy:     Cervical: No cervical adenopathy.  Skin:    General: Skin is warm and dry.     Capillary Refill: Capillary refill takes less than 2 seconds.     Coloration: Skin is not cyanotic, jaundiced or pale.     Findings: No rash.  Neurological:     General: No focal deficit present.     Mental Status: She is alert and oriented to person, place, and  time.     Cranial  Nerves: Cranial nerves are intact. No cranial nerve deficit.     Sensory: Sensation is intact. No sensory deficit.     Motor: Motor function is intact. No weakness.     Coordination: Coordination is intact. Coordination normal.     Gait: Gait abnormal (antalgic gait).     Deep Tendon Reflexes: Reflexes are normal and symmetric. Reflexes normal.  Psychiatric:        Attention and Perception: Attention and perception normal.        Mood and Affect: Mood and affect normal.        Speech: Speech normal.        Behavior: Behavior normal. Behavior is cooperative.        Thought Content: Thought content normal.        Cognition and Memory: Cognition and memory normal.        Judgment: Judgment normal.     Results for orders placed or performed in visit on 05/02/19  CMP14+EGFR  Result Value Ref Range   Glucose 93 65 - 99 mg/dL   BUN 19 6 - 24 mg/dL   Creatinine, Ser 0.76 0.57 - 1.00 mg/dL   GFR calc non Af Amer 92 >59 mL/min/1.73   GFR calc Af Amer 106 >59 mL/min/1.73   BUN/Creatinine Ratio 25 (H) 9 - 23   Sodium 140 134 - 144 mmol/L   Potassium 4.6 3.5 - 5.2 mmol/L   Chloride 104 96 - 106 mmol/L   CO2 23 20 - 29 mmol/L   Calcium 10.3 (H) 8.7 - 10.2 mg/dL   Total Protein 7.6 6.0 - 8.5 g/dL   Albumin 4.5 3.8 - 4.8 g/dL   Globulin, Total 3.1 1.5 - 4.5 g/dL   Albumin/Globulin Ratio 1.5 1.2 - 2.2   Bilirubin Total 0.3 0.0 - 1.2 mg/dL   Alkaline Phosphatase 106 39 - 117 IU/L   AST 14 0 - 40 IU/L   ALT 16 0 - 32 IU/L  Lipid panel  Result Value Ref Range   Cholesterol, Total 193 100 - 199 mg/dL   Triglycerides 53 0 - 149 mg/dL   HDL 70 >39 mg/dL   VLDL Cholesterol Cal 11 5 - 40 mg/dL   LDL Calculated 112 (H) 0 - 99 mg/dL   Chol/HDL Ratio 2.8 0.0 - 4.4 ratio     X-Ray: Lumbar spine: No acute findings, mild degenerative changes L4-L5, otherwise normal disc spacing noted. Preliminary x-ray reading by Monia Pouch, FNP-C, [facility].  X-Ray: Right knee: No acute findings, minimal joint  space narrowing without effusion. Preliminary x-ray reading by Monia Pouch, FNP-C, [facility].   Pertinent labs & imaging results that were available during my care of the patient were reviewed by me and considered in my medical decision making.  Assessment & Plan:  Lavaun was seen today for right hip and knee pain.  Diagnoses and all orders for this visit:  Acute right-sided low back pain with right-sided sciatica Degenerative changes at L4-L5 without significant joint space narrowing, will notify pt if radiology reading differs. Right SLR test positive. No red flags concerning for cauda equina syndrome. Last GFR and creatinine normal. Will treat with burst of steroids, NSAIDs for 14 days, and a referral to PT. Pt aware to report new or worsening symptoms. Pt aware of symptoms that require emergent evaluation. Follow up 4-6 weeks after PT starts or sooner if needed.  -     DG Lumbar Spine 2-3 Views; Future -  predniSONE (DELTASONE) 20 MG tablet; 2 po at sametime daily for 5 days -     naproxen (NAPROSYN) 500 MG tablet; Take 1 tablet (500 mg total) by mouth 2 (two) times daily with a meal for 14 days. -     Ambulatory referral to Physical Therapy  Acute pain of right knee Minimal joint space narrowing without effusion on xray. Will notify pt if radiology reading differs. Symptomatic care discussed. RICE therapy. NSAIDs for 14 days and a referral to PT. Follow up 4-6 weeks after PT starts or sooner if needed.  -     DG Knee 1-2 Views Right; Future -     naproxen (NAPROSYN) 500 MG tablet; Take 1 tablet (500 mg total) by mouth 2 (two) times daily with a meal for 14 days. -     Ambulatory referral to Physical Therapy     Continue all other maintenance medications.  Follow up plan: Return if symptoms worsen or fail to improve.  Continue healthy lifestyle choices, including diet (rich in fruits, vegetables, and lean proteins, and low in salt and simple carbohydrates) and exercise (at least 30  minutes of moderate physical activity daily).  Educational handout given for sciatica, knee pain  The above assessment and management plan was discussed with the patient. The patient verbalized understanding of and has agreed to the management plan. Patient is aware to call the clinic if they develop any new symptoms or if symptoms persist or worsen. Patient is aware when to return to the clinic for a follow-up visit. Patient educated on when it is appropriate to go to the emergency department.   Monia Pouch, FNP-C [facility] [phone]

## 2019-08-11 ENCOUNTER — Ambulatory Visit (INDEPENDENT_AMBULATORY_CARE_PROVIDER_SITE_OTHER): Payer: BC Managed Care – PPO

## 2019-08-11 ENCOUNTER — Encounter: Payer: Self-pay | Admitting: Physical Therapy

## 2019-08-11 ENCOUNTER — Ambulatory Visit: Payer: BC Managed Care – PPO | Attending: Family Medicine | Admitting: Physical Therapy

## 2019-08-11 ENCOUNTER — Other Ambulatory Visit: Payer: Self-pay

## 2019-08-11 DIAGNOSIS — E538 Deficiency of other specified B group vitamins: Secondary | ICD-10-CM | POA: Diagnosis not present

## 2019-08-11 DIAGNOSIS — M545 Low back pain, unspecified: Secondary | ICD-10-CM

## 2019-08-11 NOTE — Progress Notes (Signed)
Cyanocobalamin injection given to left deltoid.  Patient tolerated well. 

## 2019-08-11 NOTE — Therapy (Signed)
[facility] [address] [place], [place], [address] Phone: [phone]   Fax:  [phone]  Physical Therapy Evaluation  Patient Details  Name: Renee Thomas MRN: [id number] Date of Birth: 08/08/1971 Referring Provider (PT): Gilford SilviusLinda Rakes   Encounter Date: 08/11/2019  PT End of Session - 08/11/19 1036    Visit Number  1    Number of Visits  12    Date for PT Re-Evaluation  09/22/19    Authorization Type  PROGRESS NOTE AT 10TH VISIT.    PT Start Time  [phone]    PT Stop Time  1038    PT Time Calculation (min)  51 min    Activity Tolerance  Patient tolerated treatment well    Behavior During Therapy  WFL for tasks assessed/performed       Past Medical History:  Diagnosis Date  . Anemia   . GERD (gastroesophageal reflux disease)   . MVP (mitral valve prolapse)   . PVC's (premature ventricular contractions)     History reviewed. No pertinent surgical history.  There were no vitals filed for this visit.   Subjective Assessment - 08/11/19 1036    Subjective  COVID-19 screen performed prior to patient entering clinic.  The patient presentsto the clinic today with c/o right sided low back pain.  She reports several weeks ago the pain was going down to her lateral knee.  Medication has helped a lot.  She does report, however, after working 12 hours and driving home she is in a lot of pain getting out of her vehicle.  Her pain-level today is a 6/10.    Pertinent History  PVC's, GERD.    Patient Stated Goals  Get out of pain.    Currently in Pain?  Yes    Pain Score  6     Pain Location  Back    Pain Orientation  Right    Pain Descriptors / Indicators  Aching    Pain Type  Acute pain    Pain Onset  More than a month ago    Pain Frequency  Constant    Aggravating Factors   See above.    Pain Relieving Factors  See above.         [facility] 08/11/19 0001      Assessment   Medical Diagnosis  Acute right sided LBP and  right knee pain.    Referring Provider (PT)  Gilford SilviusLinda Rakes    Onset Date/Surgical Date  --   One month+.     Precautions   Precautions  None      Restrictions   Weight Bearing Restrictions  No      Balance Screen   Has the patient fallen in the past 6 months  No    Has the patient had a decrease in activity level because of a fear of falling?   No    Is the patient reluctant to leave their home because of a fear of falling?   No      Home Public house managernvironment   Living Environment  Private residence      Prior Function   Level of Independence  Independent      Posture/Postural Control   Posture/Postural Control  No significant limitations      Deep Tendon Reflexes   DTR Assessment Site  Patella;Achilles    Patella DTR  2+    Achilles DTR  2+      ROM / Strength  AROM / PROM / Strength  AROM;Strength      AROM   Overall AROM Comments  Lumbar extension actively= 17 degrees and flexion is full range.  Full right knee range of motion.      Strength   Overall Strength Comments  Normal LE strength.      Palpation   Palpation comment  Tender to palpation right of L4 in lumbar erector spinae musculature and TFL and glu med.      Special Tests   Other special tests  (-) SLR test today though she reports prior to medication this would have been painful.  (=) leg lengths.      Ambulation/Gait   Gait Comments  WNL.                Objective measurements completed on examination: See above findings.      [facility] Adult PT Treatment/Exercise - 08/11/19 0001      Modalities   Modalities  Electrical Stimulation;Moist Heat      Moist Heat Therapy   Number Minutes Moist Heat  20 Minutes    Moist Heat Location  Lumbar Spine      Electrical Stimulation   Electrical Stimulation Location  Right LB and hip.    Electrical Stimulation Action  Pre-mod.    Electrical Stimulation Parameters  80-150 Hz x 20 minutes.    Electrical Stimulation Goals  Tone;Pain                   PT Long Term Goals - 08/11/19 1054      PT LONG TERM GOAL #1   Title  Independent with a HEP.    Time  6    Period  Weeks    Status  New      PT LONG TERM GOAL #2   Title  Perform ADL's with pain not > 3/10.    Time  6    Period  Weeks    Status  New             Plan - 08/11/19 1049    Clinical Impression Statement  The patient presents to OPPT with c/o right sided low back pain for over a month.  Her CC today was tenderness to palpation over right lower back region and right hip (glut med and TFL).  Her right knee range of motion was full and there was no palpable tenderness today.  She states she is much better since being on medication.  Her LE DTR's ar enormal and she demonstrated a (-) right SLR (painful previosly, however).  Patient will bendfit from skilled PT intervention to address pain.    Personal Factors and Comorbidities  Comorbidity 1    Comorbidities  PVC's, GERD.    Examination-Activity Limitations  Locomotion Level;Other    Stability/Clinical Decision Making  Stable/Uncomplicated    Clinical Decision Making  Low    Rehab Potential  Excellent    PT Frequency  2x / week    PT Duration  6 weeks    PT Treatment/Interventions  ADLs/Self Care Home Management;Cryotherapy;Electrical Stimulation;Ultrasound;Moist Heat;Therapeutic activities;Therapeutic exercise;Manual techniques;Patient/family education;Passive range of motion    PT Next Visit Plan  SKTC and hip bridges.  STW/M to right LB and hip from left sdly position with pillows between knees for comfort, combo e'stim/U/S, HMP and electrical stimulation.    Consulted and Agree with Plan of Care  Patient       Patient will benefit from skilled therapeutic intervention in order  to improve the following deficits and impairments:  Decreased activity tolerance, Pain, Increased muscle spasms  Visit Diagnosis: Acute right-sided low back pain, unspecified whether sciatica present - Plan: PT  plan of care cert/re-cert     Problem List Patient Active Problem List   Diagnosis Date Noted  . GERD without esophagitis 05/02/2019  . BMI 45.0-49.9, adult ([place]) 05/02/2019  . Low serum vitamin B12 11/17/2016    Aaliyan Brinkmeier, [place] MPT 08/11/2019, 11:00 AM  [facility] [place], [place], [address] Phone: [phone]   Fax:  [phone]  Name: Renee Thomas MRN: [id number] Date of Birth: 06/30/1971

## 2019-08-15 ENCOUNTER — Ambulatory Visit: Payer: BC Managed Care – PPO | Admitting: Physical Therapy

## 2019-08-15 ENCOUNTER — Encounter: Payer: Self-pay | Admitting: Physical Therapy

## 2019-08-15 ENCOUNTER — Other Ambulatory Visit: Payer: Self-pay

## 2019-08-15 DIAGNOSIS — M545 Low back pain, unspecified: Secondary | ICD-10-CM

## 2019-08-15 NOTE — Therapy (Signed)
[facility] [address] [place], [place], [address] Phone: [phone]   Fax:  [phone]  Physical Therapy Treatment  Patient Details  Name: Renee Thomas MRN: [id number] Date of Birth: [birth year] Referring Provider (PT): Gilford Silvius   Encounter Date: 08/15/2019  PT End of Session - 08/15/19 1043    Visit Number  2    Number of Visits  12    Date for PT Re-Evaluation  09/22/19    Authorization Type  PROGRESS NOTE AT 10TH VISIT.    PT Start Time  1030    PT Stop Time  1120    PT Time Calculation (min)  50 min    Activity Tolerance  Patient tolerated treatment well    Behavior During Therapy  WFL for tasks assessed/performed       Past Medical History:  Diagnosis Date  . Anemia   . GERD (gastroesophageal reflux disease)   . MVP (mitral valve prolapse)   . PVC's (premature ventricular contractions)     History reviewed. No pertinent surgical history.  There were no vitals filed for this visit.  Subjective Assessment - 08/15/19 1044    Subjective  COVID-19 screening performed upon arrival.  Patient reports feeling better today.    Pertinent History  PVC's, GERD.    Patient Stated Goals  Get out of pain.    Currently in Pain?  No/denies         [facility] PT Assessment - 08/15/19 0001      Assessment   Medical Diagnosis  Acute right sided LBP and right knee pain.    Referring Provider (PT)  Gilford Silvius      Precautions   Precautions  None                   [facility] Adult PT Treatment/Exercise - 08/15/19 0001      Exercises   Exercises  Lumbar      Lumbar Exercises: Stretches   Single Knee to Chest Stretch  Right;Left;3 reps;30 seconds      Lumbar Exercises: Aerobic   Nustep  Level 3 x10 minutes      Lumbar Exercises: Supine   Bridge  Compliant;20 reps;2 seconds      Modalities   Modalities  Electrical Stimulation;Moist Heat      Moist Heat Therapy   Number Minutes Moist Heat  15 Minutes    Moist  Heat Location  Lumbar Spine      Electrical Stimulation   Electrical Stimulation Location  R LB and hip    Electrical Stimulation Action  pre-mod    Electrical Stimulation Parameters  80-150 hz x15 mins    Electrical Stimulation Goals  Tone;Pain      Manual Therapy   Manual Therapy  Soft tissue mobilization    Soft tissue mobilization  STW/M to right TLF and glute with TPR to decrease tone in left sidelying.              PT Education - 08/15/19 1212    Education Details  SKTC, hip bridges    Person(s) Educated  Patient    Methods  Explanation;Demonstration;Handout    Comprehension  Verbalized understanding;Returned demonstration          PT Long Term Goals - 08/11/19 1054      PT LONG TERM GOAL #1   Title  Independent with a HEP.    Time  6    Period  Weeks    Status  New  PT LONG TERM GOAL #2   Title  Perform ADL's with pain not > 3/10.    Time  6    Period  Weeks    Status  New            Plan - 08/15/19 1125    Clinical Impression Statement  Patiet responded well to therapy session with no reports of increased pain throughout TEs. Patient demonstrated good form after expanation. Patient tender to palpation to right glute med and TFL and reported a decrease in tension after STW/M and TPR. No adverse affects noted upon removal of modalities.    Personal Factors and Comorbidities  Comorbidity 1    Comorbidities  PVC's, GERD.    Examination-Activity Limitations  Locomotion Level;Other    Stability/Clinical Decision Making  Stable/Uncomplicated    Clinical Decision Making  Low    Rehab Potential  Excellent    PT Frequency  2x / week    PT Duration  6 weeks    PT Treatment/Interventions  ADLs/Self Care Home Management;Cryotherapy;Electrical Stimulation;Ultrasound;Moist Heat;Therapeutic activities;Therapeutic exercise;Manual techniques;Patient/family education;Passive range of motion    PT Next Visit Plan  SKTC and hip bridges.  STW/M to right LB and hip  from left sdly position with pillows between knees for comfort, combo e'stim/U/S, HMP and electrical stimulation.    PT Home Exercise Plan  [place], bridges    Consulted and Agree with Plan of Care  Patient       Patient will benefit from skilled therapeutic intervention in order to improve the following deficits and impairments:  Decreased activity tolerance, Pain, Increased muscle spasms  Visit Diagnosis: Acute right-sided low back pain, unspecified whether sciatica present     Problem List Patient Active Problem List   Diagnosis Date Noted  . GERD without esophagitis 05/02/2019  . BMI 45.0-49.9, adult ([place]) 05/02/2019  . Low serum vitamin B12 11/17/2016    Gabriela Eves, PT, DPT 08/15/2019, 12:15 PM  [facility] [address] [place], [place], [address] Phone: [phone]   Fax:  [phone]  Name: Renee Thomas MRN: [id number] Date of Birth: 11-06-1971

## 2019-08-19 ENCOUNTER — Other Ambulatory Visit: Payer: Self-pay

## 2019-08-20 ENCOUNTER — Ambulatory Visit (INDEPENDENT_AMBULATORY_CARE_PROVIDER_SITE_OTHER): Payer: BC Managed Care – PPO

## 2019-08-20 DIAGNOSIS — Z23 Encounter for immunization: Secondary | ICD-10-CM

## 2019-08-21 ENCOUNTER — Encounter: Payer: Self-pay | Admitting: *Deleted

## 2019-08-21 ENCOUNTER — Ambulatory Visit: Payer: BC Managed Care – PPO | Admitting: *Deleted

## 2019-08-21 ENCOUNTER — Other Ambulatory Visit: Payer: Self-pay

## 2019-08-21 DIAGNOSIS — M545 Low back pain, unspecified: Secondary | ICD-10-CM

## 2019-08-21 NOTE — Therapy (Signed)
[facility] [address] [place], [place], [address] Phone: [phone]   Fax:  [phone]  Physical Therapy Treatment  Patient Details  Name: Renee Thomas MRN: [id number] Date of Birth: 1971-07-25 Referring Provider (PT): Gilford Silvius   Encounter Date: 08/21/2019  PT End of Session - 08/21/19 0909    Visit Number  3    Number of Visits  12    Date for PT Re-Evaluation  09/22/19    Authorization Type  PROGRESS NOTE AT 10TH VISIT.    PT Start Time  0900    PT Stop Time  0950    PT Time Calculation (min)  50 min       Past Medical History:  Diagnosis Date  . Anemia   . GERD (gastroesophageal reflux disease)   . MVP (mitral valve prolapse)   . PVC's (premature ventricular contractions)     History reviewed. No pertinent surgical history.  There were no vitals filed for this visit.  Subjective Assessment - 08/21/19 0906    Subjective  COVID-19 screening performed upon arrival.  Patient reports doing good after last Rx    Pertinent History  PVC's, GERD.    Patient Stated Goals  Get out of pain.    Currently in Pain?  Yes    Pain Score  2     Pain Location  Back    Pain Orientation  Right    Pain Descriptors / Indicators  Sore    Pain Type  Acute pain    Pain Onset  More than a month ago                       [facility] Adult PT Treatment/Exercise - 08/21/19 0001      Exercises   Exercises  Lumbar      Lumbar Exercises: Stretches   Single Knee to Chest Stretch  5 reps;30 seconds      Lumbar Exercises: Aerobic   Nustep  Level 4 x13 minutes      Lumbar Exercises: Supine   Bridge  Compliant;20 reps;2 seconds;10 reps      Modalities   Modalities  Electrical Stimulation;Moist Heat      Moist Heat Therapy   Number Minutes Moist Heat  15 Minutes    Moist Heat Location  Lumbar Spine      Electrical Stimulation   Electrical Stimulation Location  R LB and hip    Electrical Stimulation Action  Premod    Electrical Stimulation Parameters  80-150hz  x 15 mins    Electrical Stimulation Goals  Tone;Pain      Manual Therapy   Manual Therapy  Soft tissue mobilization    Soft tissue mobilization  STW/M to right TLF and glute with TPR to decrease tone in left sidelying.                   PT Long Term Goals - 08/11/19 1054      PT LONG TERM GOAL #1   Title  Independent with a HEP.    Time  6    Period  Weeks    Status  New      PT LONG TERM GOAL #2   Title  Perform ADL's with pain not > 3/10.    Time  6    Period  Weeks    Status  New            Plan - 08/21/19 1104  Clinical Impression Statement  Pt arrived today doing better with decreased LBP.She was able to complete therex without increased pain. She had some notable tenderness in L5-S1 area during STW. Normal modality response today.    Personal Factors and Comorbidities  Comorbidity 1    Comorbidities  PVC's, GERD.    Examination-Activity Limitations  Locomotion Level;Other    Stability/Clinical Decision Making  Stable/Uncomplicated    Rehab Potential  Excellent    PT Frequency  2x / week    PT Duration  6 weeks    PT Treatment/Interventions  ADLs/Self Care Home Management;Cryotherapy;Electrical Stimulation;Ultrasound;Moist Heat;Therapeutic activities;Therapeutic exercise;Manual techniques;Patient/family education;Passive range of motion    PT Next Visit Plan  SKTC and hip bridges.  STW/M to right LB and hip from left sdly position with pillows between knees for comfort, combo e'stim/U/S, HMP and electrical stimulation.    PT Home Exercise Plan  [place], bridges    Consulted and Agree with Plan of Care  Patient       Patient will benefit from skilled therapeutic intervention in order to improve the following deficits and impairments:  Decreased activity tolerance, Pain, Increased muscle spasms  Visit Diagnosis: Acute right-sided low back pain, unspecified whether sciatica present     Problem List Patient  Active Problem List   Diagnosis Date Noted  . GERD without esophagitis 05/02/2019  . BMI 45.0-49.9, adult ([place]) 05/02/2019  . Low serum vitamin B12 11/17/2016    Maribelle Hopple,CHRIS , PTA 08/21/2019, 11:22 AM  [facility] [place], [place], [address] Phone: [phone]   Fax:  [phone]  Name: KAMORIE ALDOUS MRN: [id number] Date of Birth: 09-24-1971

## 2019-08-27 ENCOUNTER — Other Ambulatory Visit: Payer: Self-pay

## 2019-08-27 ENCOUNTER — Ambulatory Visit: Payer: BC Managed Care – PPO | Admitting: Physical Therapy

## 2019-08-27 ENCOUNTER — Encounter: Payer: Self-pay | Admitting: Physical Therapy

## 2019-08-27 DIAGNOSIS — M545 Low back pain, unspecified: Secondary | ICD-10-CM

## 2019-08-27 NOTE — Therapy (Signed)
[facility] [place], [place], [address] Phone: [phone]   Fax:  [phone]  Physical Therapy Treatment  Patient Details  Name: Renee Thomas MRN: [id number] Date of Birth: 09-30-1971 Referring Provider (PT): Darla Lesches   Encounter Date: 08/27/2019  PT End of Session - 08/27/19 1109    Visit Number  4    Number of Visits  12    Date for PT Re-Evaluation  09/22/19    Authorization Type  PROGRESS NOTE AT 10TH VISIT.    PT Start Time  0815    PT Stop Time  0913    PT Time Calculation (min)  58 min    Activity Tolerance  Patient tolerated treatment well    Behavior During Therapy  WFL for tasks assessed/performed       Past Medical History:  Diagnosis Date  . Anemia   . GERD (gastroesophageal reflux disease)   . MVP (mitral valve prolapse)   . PVC's (premature ventricular contractions)     History reviewed. No pertinent surgical history.  There were no vitals filed for this visit.  Subjective Assessment - 08/27/19 1021    Subjective  COVID-19 screen performed prior to patient entering clinic.  Treatments are helping.    Pertinent History  PVC's, GERD.    Patient Stated Goals  Get out of pain.    Currently in Pain?  Yes    Pain Score  2     Pain Location  Back    Pain Orientation  Right    Pain Descriptors / Indicators  Sore    Pain Type  Acute pain    Pain Onset  More than a month ago                       [facility] Adult PT Treatment/Exercise - 08/27/19 0001      Exercises   Exercises  Lumbar      Lumbar Exercises: Aerobic   Nustep  Level 4 x 13 minutes.      Modalities   Modalities  Electrical Stimulation;Moist Heat;Ultrasound      Moist Heat Therapy   Number Minutes Moist Heat  20 Minutes    Moist Heat Location  Lumbar Spine      Electrical Stimulation   Electrical Stimulation Location  Right     Electrical Stimulation Action  Pre-mod.    Electrical Stimulation Parameters   80-150 Hz x 20 minutes.    Electrical Stimulation Goals  Tone;Pain      Ultrasound   Ultrasound Location  Affected right hip.    Ultrasound Parameters  Combo e'stim/U/S at 1.50 W/CM2 x 7 minutes.    Ultrasound Goals  Pain      Manual Therapy   Manual Therapy  Soft tissue mobilization    Soft tissue mobilization  STW/M x 7 minutes.                  PT Long Term Goals - 08/11/19 1054      PT LONG TERM GOAL #1   Title  Independent with a HEP.    Time  6    Period  Weeks    Status  New      PT LONG TERM GOAL #2   Title  Perform ADL's with pain not > 3/10.    Time  6    Period  Weeks    Status  New  Plan - 08/27/19 1119    Clinical Impression Statement  The patient is reporting a relatively low pain-level in spite of working 3 12 hours shifts recently.  She had some palpable tenderness to the right hip but she responded well to treatment today.    Personal Factors and Comorbidities  Comorbidity 1    Comorbidities  PVC's, GERD.    Examination-Activity Limitations  Locomotion Level;Other    Stability/Clinical Decision Making  Stable/Uncomplicated    Rehab Potential  Excellent    PT Frequency  2x / week    PT Duration  6 weeks    PT Treatment/Interventions  ADLs/Self Care Home Management;Cryotherapy;Electrical Stimulation;Ultrasound;Moist Heat;Therapeutic activities;Therapeutic exercise;Manual techniques;Patient/family education;Passive range of motion    PT Next Visit Plan  SKTC and hip bridges.  STW/M to right LB and hip from left sdly position with pillows between knees for comfort, combo e'stim/U/S, HMP and electrical stimulation.    PT Home Exercise Plan  [facility], bridges    Consulted and Agree with Plan of Care  Patient       Patient will benefit from skilled therapeutic intervention in order to improve the following deficits and impairments:  Decreased activity tolerance, Pain, Increased muscle spasms  Visit Diagnosis: Acute right-sided low back  pain, unspecified whether sciatica present     Problem List Patient Active Problem List   Diagnosis Date Noted  . GERD without esophagitis 05/02/2019  . BMI 45.0-49.9, adult ([facility]) 05/02/2019  . Low serum vitamin B12 11/17/2016    Tereso Unangst, [place] MPT 08/27/2019, 11:22 AM  [facility] [address] [place], [place], [address] Phone: [phone]   Fax:  [phone]  Name: Renee Thomas MRN: [id number] Date of Birth: 1971/02/14

## 2019-09-03 ENCOUNTER — Ambulatory Visit: Payer: BC Managed Care – PPO | Admitting: Physical Therapy

## 2019-09-04 ENCOUNTER — Other Ambulatory Visit: Payer: Self-pay

## 2019-09-04 ENCOUNTER — Ambulatory Visit: Payer: BC Managed Care – PPO | Admitting: Physical Therapy

## 2019-09-04 ENCOUNTER — Encounter: Payer: Self-pay | Admitting: Physical Therapy

## 2019-09-04 DIAGNOSIS — M545 Low back pain, unspecified: Secondary | ICD-10-CM

## 2019-09-04 NOTE — Therapy (Signed)
[facility] [place], [place], [address] Phone: [phone]   Fax:  [phone]  Physical Therapy Treatment  Patient Details  Name: Renee Thomas MRN: [id number] Date of Birth: 1971-05-29 Referring Provider (PT): Darla Lesches   Encounter Date: 09/04/2019  PT End of Session - 09/04/19 0901    Visit Number  5    Number of Visits  12    Date for PT Re-Evaluation  09/22/19    Authorization Type  PROGRESS NOTE AT 10TH VISIT.    PT Start Time  0815    PT Stop Time  0903    PT Time Calculation (min)  48 min    Activity Tolerance  Patient tolerated treatment well    Behavior During Therapy  [facility] for tasks assessed/performed       Past Medical History:  Diagnosis Date  . Anemia   . GERD (gastroesophageal reflux disease)   . MVP (mitral valve prolapse)   . PVC's (premature ventricular contractions)     History reviewed. No pertinent surgical history.  There were no vitals filed for this visit.  Subjective Assessment - 09/04/19 1047    Subjective  COVID-19 screen performed prior to patient entering clinic.  Patient reports sessions are going well and she is able to tolerate standing at work for longer.    Pertinent History  PVC's, GERD.    Patient Stated Goals  Get out of pain.    Currently in Pain?  Yes    Pain Score  2     Pain Location  Back    Pain Orientation  Right    Pain Descriptors / Indicators  Sore    Pain Type  Acute pain    Pain Onset  More than a month ago    Pain Frequency  Constant         [facility] PT Assessment - 09/04/19 0001      Assessment   Medical Diagnosis  Acute right sided LBP and right knee pain.    Referring Provider (PT)  Darla Lesches      Precautions   Precautions  None      Restrictions   Weight Bearing Restrictions  No                   [facility] Adult PT Treatment/Exercise - 09/04/19 0001      Exercises   Exercises  Lumbar      Lumbar Exercises: Aerobic   Nustep   Level 4 x 12 minutes.      Modalities   Modalities  Electrical Stimulation;Moist Heat;Ultrasound      Moist Heat Therapy   Number Minutes Moist Heat  15 Minutes    Moist Heat Location  Lumbar Spine      Electrical Stimulation   Electrical Stimulation Location  right low back and hip    Electrical Stimulation Action  pre-mod    Electrical Stimulation Parameters  80-150 hz x15     Electrical Stimulation Goals  Tone;Pain      Ultrasound   Ultrasound Location  right hip and QL    Ultrasound Parameters  combo us/e-stim 100% 1 mhz, 1.5 w/cm2 x10 mins    Ultrasound Goals  Pain      Manual Therapy   Manual Therapy  Soft tissue mobilization    Soft tissue mobilization  STW/M and trigger point release to right hip/QL to decrease pain  PT Long Term Goals - 08/11/19 1054      PT LONG TERM GOAL #1   Title  Independent with a HEP.    Time  6    Period  Weeks    Status  New      PT LONG TERM GOAL #2   Title  Perform ADL's with pain not > 3/10.    Time  6    Period  Weeks    Status  New            Plan - 09/04/19 1049    Clinical Impression Statement  Patient responded well to therapy session and reports improved function throughout daily activities and work activities. Patient responded well with STW/M and was educated on tennis ball mobilization and self TPR. Normal response to modalities upon removal.    Personal Factors and Comorbidities  Comorbidity 1    Comorbidities  PVC's, GERD.    Examination-Activity Limitations  Locomotion Level;Other    Stability/Clinical Decision Making  Stable/Uncomplicated    Clinical Decision Making  Low    Rehab Potential  Excellent    PT Frequency  2x / week    PT Duration  6 weeks    PT Treatment/Interventions  ADLs/Self Care Home Management;Cryotherapy;Electrical Stimulation;Ultrasound;Moist Heat;Therapeutic activities;Therapeutic exercise;Manual techniques;Patient/family education;Passive range of motion    PT  Next Visit Plan  SKTC and hip bridges.  STW/M to right LB and hip from left sdly position with pillows between knees for comfort, combo e'stim/U/S, HMP and electrical stimulation.    PT Home Exercise Plan  STKC, bridges    Consulted and Agree with Plan of Care  Patient       Patient will benefit from skilled therapeutic intervention in order to improve the following deficits and impairments:  Decreased activity tolerance, Pain, Increased muscle spasms  Visit Diagnosis: Acute right-sided low back pain, unspecified whether sciatica present     Problem List Patient Active Problem List   Diagnosis Date Noted  . GERD without esophagitis 05/02/2019  . BMI 45.0-49.9, adult (HCC) 05/02/2019  . Low serum vitamin B12 11/17/2016    Guss Bunde, PT, DPT 09/04/2019, 11:03 AM  [facility] [address] [place], [place], [address] Phone: [phone]   Fax:  [phone]  Name: Renee Thomas MRN: [id number] Date of Birth: 1971/04/08

## 2019-09-08 ENCOUNTER — Telehealth: Payer: Self-pay | Admitting: Nurse Practitioner

## 2019-09-09 ENCOUNTER — Other Ambulatory Visit: Payer: Self-pay

## 2019-09-09 ENCOUNTER — Ambulatory Visit (INDEPENDENT_AMBULATORY_CARE_PROVIDER_SITE_OTHER): Payer: BC Managed Care – PPO | Admitting: *Deleted

## 2019-09-09 DIAGNOSIS — E538 Deficiency of other specified B group vitamins: Secondary | ICD-10-CM | POA: Diagnosis not present

## 2019-09-10 ENCOUNTER — Ambulatory Visit: Payer: BC Managed Care – PPO | Attending: Family Medicine | Admitting: Physical Therapy

## 2019-09-10 ENCOUNTER — Encounter: Payer: Self-pay | Admitting: Physical Therapy

## 2019-09-10 ENCOUNTER — Other Ambulatory Visit: Payer: Self-pay

## 2019-09-10 DIAGNOSIS — M545 Low back pain, unspecified: Secondary | ICD-10-CM

## 2019-09-10 NOTE — Therapy (Signed)
[facility] [place], [place], [address] Phone: [phone]   Fax:  [phone]  Physical Therapy Treatment  Patient Details  Name: Renee Thomas MRN: [id number] Date of Birth: 18-May-1971 Referring Provider (PT): Darla Lesches   Encounter Date: 09/10/2019  PT End of Session - 09/10/19 0903    Visit Number  6    Number of Visits  12    Date for PT Re-Evaluation  09/22/19    Authorization Type  PROGRESS NOTE AT 10TH VISIT.    PT Start Time  [phone]    PT Stop Time  0909    PT Time Calculation (min)  53 min    Activity Tolerance  Patient tolerated treatment well    Behavior During Therapy  [facility] for tasks assessed/performed       Past Medical History:  Diagnosis Date  . Anemia   . GERD (gastroesophageal reflux disease)   . MVP (mitral valve prolapse)   . PVC's (premature ventricular contractions)     History reviewed. No pertinent surgical history.  There were no vitals filed for this visit.  Subjective Assessment - 09/10/19 0817    Subjective  COVID-19 screen performed prior to patient entering clinic.  Patient reports that her low back and hip are better but has some discomfort.    Pertinent History  PVC's, GERD.    Patient Stated Goals  Get out of pain.    Currently in Pain?  Yes    Pain Score  2     Pain Location  Back    Pain Orientation  Right;Lower    Pain Descriptors / Indicators  Sore;Discomfort    Pain Type  Acute pain    Pain Onset  More than a month ago    Pain Frequency  Constant         [facility] PT Assessment - 09/10/19 0001      Assessment   Medical Diagnosis  Acute right sided LBP and right knee pain.    Referring Provider (PT)  Darla Lesches      Precautions   Precautions  None      Restrictions   Weight Bearing Restrictions  No                   [facility] Adult PT Treatment/Exercise - 09/10/19 0001      Lumbar Exercises: Aerobic   Nustep  L4  x17 min      Modalities   Modalities   Electrical Stimulation;Moist Heat;Ultrasound      Moist Heat Therapy   Number Minutes Moist Heat  15 Minutes    Moist Heat Location  Lumbar Spine      Electrical Stimulation   Electrical Stimulation Location  right low back and hip    Electrical Stimulation Action  Pre-Mod    Electrical Stimulation Parameters  80-150 hz x15 min    Electrical Stimulation Goals  Pain      Ultrasound   Ultrasound Location  R QL and superior glute    Ultrasound Parameters  Combo 1.5 w/c2, 100%, 1 mhz x10 min    Ultrasound Goals  Pain      Manual Therapy   Manual Therapy  Soft tissue mobilization    Soft tissue mobilization  STW/TPR to R superior glute to reduce TPs                  PT Long Term Goals - 09/10/19 0907      PT LONG  TERM GOAL #1   Title  Independent with a HEP.    Time  6    Period  Weeks    Status  Achieved      PT LONG TERM GOAL #2   Title  Perform ADL's with pain not > 3/10.    Time  6    Period  Weeks    Status  On-going            Plan - 09/10/19 0907    Clinical Impression Statement  Patient presented in clinic with low level R LBP with minimal TPs in R superior glute. Patient experiencing most pain upon arrival at work and for the first two hours of her shift but then pain diminishes. Patient still compliant with HEP at home. Intermittant tenderness reported with TPR in R superior glute region. Normal modalities response noted following removal of the modalities.    Personal Factors and Comorbidities  Comorbidity 1    Comorbidities  PVC's, GERD.    Examination-Activity Limitations  Locomotion Level;Other    Stability/Clinical Decision Making  Stable/Uncomplicated    Rehab Potential  Excellent    PT Frequency  2x / week    PT Duration  6 weeks    PT Treatment/Interventions  ADLs/Self Care Home Management;Cryotherapy;Electrical Stimulation;Ultrasound;Moist Heat;Therapeutic activities;Therapeutic exercise;Manual techniques;Patient/family education;Passive  range of motion    PT Next Visit Plan  SKTC and hip bridges.  STW/M to right LB and hip from left sdly position with pillows between knees for comfort, combo e'stim/U/S, HMP and electrical stimulation.    PT Home Exercise Plan  STKC, bridges    Consulted and Agree with Plan of Care  Patient       Patient will benefit from skilled therapeutic intervention in order to improve the following deficits and impairments:  Decreased activity tolerance, Pain, Increased muscle spasms  Visit Diagnosis: Acute right-sided low back pain, unspecified whether sciatica present     Problem List Patient Active Problem List   Diagnosis Date Noted  . GERD without esophagitis 05/02/2019  . BMI 45.0-49.9, adult (HCC) 05/02/2019  . Low serum vitamin B12 11/17/2016    Marvell Fuller, PTA 09/10/2019, 9:14 AM  [facility] [address] [place], [place], [address] Phone: [phone]   Fax:  [phone]  Name: Renee Thomas MRN: [id number] Date of Birth: 23-May-1971

## 2019-09-18 ENCOUNTER — Encounter: Payer: Self-pay | Admitting: Physical Therapy

## 2019-09-18 ENCOUNTER — Ambulatory Visit: Payer: BC Managed Care – PPO | Admitting: Physical Therapy

## 2019-09-18 ENCOUNTER — Other Ambulatory Visit: Payer: Self-pay

## 2019-09-18 DIAGNOSIS — M545 Low back pain, unspecified: Secondary | ICD-10-CM

## 2019-09-18 NOTE — Therapy (Signed)
[facility] [address] [place], [place], [address] Phone: [phone]   Fax:  [phone]  Physical Therapy Treatment  Patient Details  Name: Renee Thomas MRN: [id number] Date of Birth: 18-Feb-1971 Referring Provider (PT): Gilford Silvius   Encounter Date: 09/18/2019  PT End of Session - 09/18/19 0927    Visit Number  7    Number of Visits  12    Date for PT Re-Evaluation  09/22/19    Authorization Type  PROGRESS NOTE AT 10TH VISIT.    PT Start Time  0815    PT Stop Time  0907    PT Time Calculation (min)  52 min    Activity Tolerance  Patient tolerated treatment well    Behavior During Therapy  [facility] for tasks assessed/performed       Past Medical History:  Diagnosis Date  . Anemia   . GERD (gastroesophageal reflux disease)   . MVP (mitral valve prolapse)   . PVC's (premature ventricular contractions)     History reviewed. No pertinent surgical history.  There were no vitals filed for this visit.  Subjective Assessment - 09/18/19 0919    Subjective  COVID-19 screen performed prior to patient entering clinic.  I'm a lot better.    Pertinent History  PVC's, GERD.    Patient Stated Goals  Get out of pain.    Currently in Pain?  Yes    Pain Score  2     Pain Location  Back    Pain Orientation  Right;Lower    Pain Descriptors / Indicators  Sore;Discomfort    Pain Type  Acute pain    Pain Onset  More than a month ago                       [facility] Adult PT Treatment/Exercise - 09/18/19 0001      Exercises   Exercises  Knee/Hip      Lumbar Exercises: Aerobic   Nustep  level 4 x 15 minutes.      Modalities   Modalities  Electrical Stimulation;Moist Heat      Moist Heat Therapy   Number Minutes Moist Heat  20 Minutes    Moist Heat Location  Lumbar Spine      Electrical Stimulation   Electrical Stimulation Location  RT lateral hip.    Electrical Stimulation Action  Pre-mod.    Electrical Stimulation  Parameters  80-150 Hz x 20 minutes.    Electrical Stimulation Goals  Pain      Manual Therapy   Manual Therapy  Soft tissue mobilization    Soft tissue mobilization  STW/M x 10 minutes to right affected                  PT Long Term Goals - 09/10/19 0907      PT LONG TERM GOAL #1   Title  Independent with a HEP.    Time  6    Period  Weeks    Status  Achieved      PT LONG TERM GOAL #2   Title  Perform ADL's with pain not > 3/10.    Time  6    Period  Weeks    Status  On-going            Plan - 09/18/19 0933    Clinical Impression Statement  The patient reports she is feeling much better.  She has some remaining palpable tenderness over  her right lateral hip but other than that she is doing great.    Personal Factors and Comorbidities  Comorbidity 1    Comorbidities  PVC's, GERD.    Examination-Activity Limitations  Locomotion Level;Other    Stability/Clinical Decision Making  Stable/Uncomplicated    Rehab Potential  Excellent    PT Frequency  2x / week    PT Duration  6 weeks    PT Treatment/Interventions  ADLs/Self Care Home Management;Cryotherapy;Electrical Stimulation;Ultrasound;Moist Heat;Therapeutic activities;Therapeutic exercise;Manual techniques;Patient/family education;Passive range of motion    PT Next Visit Plan  SKTC and hip bridges.  STW/M to right LB and hip from left sdly position with pillows between knees for comfort, combo e'stim/U/S, HMP and electrical stimulation.    PT Home Exercise Plan  [place], bridges    Consulted and Agree with Plan of Care  Patient       Patient will benefit from skilled therapeutic intervention in order to improve the following deficits and impairments:  Decreased activity tolerance, Pain, Increased muscle spasms  Visit Diagnosis: Acute right-sided low back pain, unspecified whether sciatica present     Problem List Patient Active Problem List   Diagnosis Date Noted  . GERD without esophagitis 05/02/2019  .  BMI 45.0-49.9, adult ([place]) 05/02/2019  . Low serum vitamin B12 11/17/2016    Amorita Vanrossum, [place] MPT 09/18/2019, 9:44 AM  [facility] [address] [place], [place], [address] Phone: [phone]   Fax:  [phone]  Name: Renee Thomas MRN: [id number] Date of Birth: 11-20-1970

## 2019-09-24 ENCOUNTER — Other Ambulatory Visit: Payer: Self-pay

## 2019-09-24 ENCOUNTER — Ambulatory Visit: Payer: BC Managed Care – PPO | Admitting: Physical Therapy

## 2019-09-24 ENCOUNTER — Encounter: Payer: Self-pay | Admitting: Physical Therapy

## 2019-09-24 DIAGNOSIS — M545 Low back pain, unspecified: Secondary | ICD-10-CM

## 2019-09-24 NOTE — Therapy (Signed)
[facility] [place], [place], [address] Phone: [phone]   Fax:  [phone]  Physical Therapy Treatment  Patient Details  Name: Renee Thomas MRN: [id number] Date of Birth: Sep 19, 1971 Referring Provider (PT): Darla Lesches   Encounter Date: 09/24/2019  PT End of Session - 09/24/19 0857    Visit Number  8    Number of Visits  12    Date for PT Re-Evaluation  09/22/19    Authorization Type  PROGRESS NOTE AT 10TH VISIT.    PT Start Time  0815    PT Stop Time  0906    PT Time Calculation (min)  51 min       Past Medical History:  Diagnosis Date  . Anemia   . GERD (gastroesophageal reflux disease)   . MVP (mitral valve prolapse)   . PVC's (premature ventricular contractions)     History reviewed. No pertinent surgical history.  There were no vitals filed for this visit.  Subjective Assessment - 09/24/19 0816    Subjective  COVID-19 screen performed prior to patient entering clinic.  Patient reported feeling better today..    Pertinent History  PVC's, GERD.    Patient Stated Goals  Get out of pain.    Currently in Pain?  Yes    Pain Score  2     Pain Location  Back    Pain Orientation  Right;Lower    Pain Descriptors / Indicators  Sore    Pain Type  Acute pain    Pain Onset  More than a month ago    Pain Frequency  Constant    Aggravating Factors   Standing for 12 hours at work    Pain Relieving Factors  at rest                       [facility] Adult PT Treatment/Exercise - 09/24/19 0001      Lumbar Exercises: Aerobic   Nustep  level 4 x 15 min, while educated patient on posture awareness techniques and core activation      Moist Heat Therapy   Number Minutes Moist Heat  15 Minutes    Moist Heat Location  Lumbar Spine      Electrical Stimulation   Electrical Stimulation Location  RT lateral hip.    Electrical Stimulation Action  premod    Electrical Stimulation Parameters  80-_0  x32mn     Electrical Stimulation Goals  Pain      Manual Therapy   Manual Therapy  Soft tissue mobilization    Soft tissue mobilization  manual STW to right supirior glute to reduce pain and tone             PT Education - 09/24/19 0901    Education Details  Posture awareness technique, core activation, and TENS unit for home    Person(s) Educated  Patient    Methods  Explanation;Demonstration    Comprehension  Verbalized understanding          PT Long Term Goals - 09/24/19 05830     PT LONG TERM GOAL #1   Title  Independent with a HEP.    Time  6    Period  Weeks    Status  Achieved      PT LONG TERM GOAL #2   Title  Perform ADL's with pain not > 3/10.    Time  6    Status  Achieved   met  today per reported at 2/10 09/24/19           Plan - 09/24/19 0826    Clinical Impression Statement  Patient arrived with little pain and overall feels better. Patient able to perform ADL's with greater ease and pain 2/10 at most. Patient doing HEP which helps maintain low level discomfort. Patient ready DC today per request. All goals met.    Personal Factors and Comorbidities  Comorbidity 1    Comorbidities  PVC's, GERD.    Examination-Activity Limitations  Locomotion Level;Other    Stability/Clinical Decision Making  Stable/Uncomplicated    Rehab Potential  Excellent    PT Frequency  2x / week    PT Duration  6 weeks    PT Treatment/Interventions  ADLs/Self Care Home Management;Cryotherapy;Electrical Stimulation;Ultrasound;Moist Heat;Therapeutic activities;Therapeutic exercise;Manual techniques;Patient/family education;Passive range of motion    PT Next Visit Plan  DC    Consulted and Agree with Plan of Care  Patient       Patient will benefit from skilled therapeutic intervention in order to improve the following deficits and impairments:  Decreased activity tolerance, Pain, Increased muscle spasms  Visit Diagnosis: Acute right-sided low back pain, unspecified whether  sciatica present     Problem List Patient Active Problem List   Diagnosis Date Noted  . GERD without esophagitis 05/02/2019  . BMI 45.0-49.9, adult ([place]) 05/02/2019  . Low serum vitamin B12 11/17/2016    Ladean Raya, PTA 09/24/19 9:12 AM  [facility] [place], [place], [address] Phone: [phone]   Fax:  [phone]  Name: Renee Thomas MRN: [id number] Date of Birth: [birth year]  PHYSICAL THERAPY DISCHARGE SUMMARY  Visits from Start of Care: 8.  Current functional level related to goals / functional outcomes: See above.   Remaining deficits: Goals met.   Education / Equipment: HEP. Plan: Patient agrees to discharge.  Patient goals were met. Patient is being discharged due to meeting the stated rehab goals.  ?????         Mali Applegate MPT

## 2019-09-26 ENCOUNTER — Other Ambulatory Visit (HOSPITAL_COMMUNITY)
Admission: RE | Admit: 2019-09-26 | Discharge: 2019-09-26 | Disposition: A | Discharge status: Home / Self Care | Payer: BC Managed Care – PPO | Source: Ambulatory Visit | Attending: Certified Nurse Midwife | Admitting: Certified Nurse Midwife

## 2019-09-26 ENCOUNTER — Encounter: Payer: Self-pay | Admitting: Certified Nurse Midwife

## 2019-09-26 ENCOUNTER — Ambulatory Visit: Payer: BC Managed Care – PPO | Admitting: Certified Nurse Midwife

## 2019-09-26 ENCOUNTER — Other Ambulatory Visit: Payer: Self-pay

## 2019-09-26 VITALS — BP 118/78 | HR 68 | Temp 97.1°F | Resp 16 | Ht 65.75 in | Wt 275.0 lb

## 2019-09-26 DIAGNOSIS — Z124 Encounter for screening for malignant neoplasm of cervix: Secondary | ICD-10-CM

## 2019-09-26 DIAGNOSIS — Z01419 Encounter for gynecological examination (general) (routine) without abnormal findings: Secondary | ICD-10-CM

## 2019-09-26 DIAGNOSIS — N926 Irregular menstruation, unspecified: Secondary | ICD-10-CM

## 2019-09-26 DIAGNOSIS — N951 Menopausal and female climacteric states: Secondary | ICD-10-CM

## 2019-09-26 NOTE — Patient Instructions (Signed)

## 2019-09-26 NOTE — Progress Notes (Signed)
48 y.o. G0P0000 Single  African American Fe here for annual exam.  Periods have been sporadic with last period in 01/2019 and now has period with 3 day duration now, no cramping. Occasional hot flashes and night sweats. No vaginal dryness. Sees Hassell Done FNP every 6 months, PVC's has decreased significantly. Had cardiology evaluation in 2018 all normal. No weight change that she is aware of. Still working full time [employer] fundraiser. No other health issues today.  Patient's last menstrual period was 09/23/2019 (exact date).          Sexually active: No.  The current method of family planning is abstinence.    Exercising: Yes.    walking Smoker:  no  Review of Systems  Constitutional: Negative.   HENT: Negative.   Eyes: Negative.   Respiratory: Negative.   Cardiovascular: Negative.   Gastrointestinal: Negative.   Genitourinary: Negative.   Musculoskeletal: Negative.   Skin: Negative.   Neurological: Negative.   Endo/Heme/Allergies: Negative.   Psychiatric/Behavioral: Negative.     Health Maintenance: Pap:  08-25-16 neg HPV HR Neg History of Abnormal Pap: no MMG:  12-26-2018 category a density birads 1:neg Self Breast exams: yes Colonoscopy:  2019 f/u 13yrs polyps BMD:   none TDaP:  2014 Shingles: no Pneumonia: no Hep C and HIV: HIV neg 2016, Hep c neg 2017 Labs: yes   reports that she has never smoked. She has never used smokeless tobacco. She reports that she does not drink alcohol or use drugs.  Past Medical History:  Diagnosis Date  . Anemia   . GERD (gastroesophageal reflux disease)   . MVP (mitral valve prolapse)   . PVC's (premature ventricular contractions)     History reviewed. No pertinent surgical history.  Current Outpatient Medications  Medication Sig Dispense Refill  . Carboxymethylcellul-Glycerin (CLEAR EYES FOR DRY EYES OP) Place 1-2 drops into both eyes daily as needed.    . cetirizine (ZYRTEC) 10 MG tablet Take 10 mg by mouth daily.    . Cholecalciferol (VITAMIN D  PO) Take by mouth daily.    . cyanocobalamin (,VITAMIN B-12,) 1000 MCG/ML injection Inject 1,000 mcg into the muscle every 30 (thirty) days.    . fluticasone (FLONASE) 50 MCG/ACT nasal spray SPRAY 2 SPRAYS INTO EACH NOSTRIL EVERY DAY 48 mL 1  . KRILL OIL PO Take 350 mg by mouth daily.     Marland Kitchen omeprazole (PRILOSEC) 20 MG capsule TAKE 1 CAPSULE BY MOUTH EVERY DAY 90 capsule 1  . Probiotic Product (TRUBIOTICS PO) Take by mouth daily.     No current facility-administered medications for this visit.     Family History  Problem Relation Age of Onset  . Hypertension Mother   . Diabetes Mother   . Other Mother        renal disease  . Hypertension Father   . Heart attack Father   . Hypertension Sister   . Diabetes Brother   . Diabetes Brother     ROS:  Pertinent items are noted in HPI.  Otherwise, a comprehensive ROS was negative.  Exam:   BP 118/78   Pulse 68   Temp (!) 97.1 F (36.2 C) (Skin)   Resp 16   Ht 5' 5.75" (1.67 m)   Wt 275 lb (124.7 kg)   LMP 09/23/2019 (Exact Date)   BMI 44.72 kg/m  Height: 5' 5.75" (167 cm) Ht Readings from Last 3 Encounters:  09/26/19 5' 5.75" (1.67 m)  08/06/19 5\' 5"  (1.651 m)  05/02/19 5\' 5"  (1.651  m)    General appearance: alert, cooperative and appears stated age Head: Normocephalic, without obvious abnormality, atraumatic Neck: no adenopathy, supple, symmetrical, trachea midline and thyroid normal to inspection and palpation Lungs: clear to auscultation bilaterally Breasts: normal appearance, no masses or tenderness, No nipple retraction or dimpling, No nipple discharge or bleeding, No axillary or supraclavicular adenopathy Heart: regular rate and rhythm Abdomen: soft, non-tender; no masses,  no organomegaly Extremities: extremities normal, atraumatic, no cyanosis or edema Skin: Skin color, texture, turgor normal. No rashes or lesions Lymph nodes: Cervical, supraclavicular, and axillary nodes normal. No abnormal inguinal nodes  palpated Neurologic: Grossly normal   Pelvic: External genitalia:  no lesions              Urethra:  normal appearing urethra with no masses, tenderness or lesions              Bartholin's and Skene's: normal                 Vagina: normal appearing vagina with normal color and discharge, no lesions              Cervix: no cervical motion tenderness, no lesions and period present              Pap taken: Yes.   Bimanual Exam:  Uterus:  normal size, contour, position, consistency, mobility, non-tender and anteverted              Adnexa: normal adnexa and no mass, fullness, tenderness               Rectovaginal: Confirms               Anus:  normal sphincter tone, no lesions  Chaperone present: yes  A:  Well Woman with normal exam  Perimenopausal with menstrual cycle change  PCP management of Vitamin D and B-12 and Prilosec, labs all normal per patient  Overweight working on weight management    P:   Reviewed health and wellness pertinent to exam  Discussed perimenopausal and period expectations. Patient needs to advise if no period in 3 months.   Labs: TSH, Prolactin, FSH  Continue follow up with PCP as indicated  Pap smear: yes   counseled on breast self exam, mammography screening, adequate intake of calcium and vitamin D, diet and exercise, Kegel's exercises  return annually or prn  An After Visit Summary was printed and given to the patient.

## 2019-09-27 DIAGNOSIS — H6692 Otitis media, unspecified, left ear: Secondary | ICD-10-CM | POA: Diagnosis not present

## 2019-09-27 DIAGNOSIS — R0981 Nasal congestion: Secondary | ICD-10-CM | POA: Diagnosis not present

## 2019-09-27 DIAGNOSIS — Z6841 Body Mass Index (BMI) 40.0 and over, adult: Secondary | ICD-10-CM | POA: Diagnosis not present

## 2019-09-27 LAB — FOLLICLE STIMULATING HORMONE: FSH: 10.7 m[IU]/mL

## 2019-09-27 LAB — TSH: TSH: 2.26 u[IU]/mL (ref 0.450–4.500)

## 2019-09-27 LAB — PROLACTIN: Prolactin: 25.5 ng/mL — ABNORMAL HIGH (ref 4.8–23.3)

## 2019-09-28 ENCOUNTER — Other Ambulatory Visit: Payer: Self-pay | Admitting: Certified Nurse Midwife

## 2019-09-28 DIAGNOSIS — R899 Unspecified abnormal finding in specimens from other organs, systems and tissues: Secondary | ICD-10-CM

## 2019-09-29 ENCOUNTER — Telehealth: Payer: Self-pay | Admitting: *Deleted

## 2019-09-29 NOTE — Telephone Encounter (Signed)
Patient returning call to Emily.

## 2019-09-29 NOTE — Telephone Encounter (Signed)
Message left to return call to Triage Nurse at [phone].   Notes recorded by Regina Eck, CNM on 09/28/2019 at 3:51 PM EST  Notify patient that her prolactin level was borderline elevated and this is the reason for no period.  FSH and TSH are normal, no menopausal level yet. This needs to be rechecked fasting in two weeks order in, please schedule.

## 2019-09-29 NOTE — Telephone Encounter (Signed)
Returned call to patient. Message given to patient as seen below from Melvia Heaps, CNM and patient verbalized understanding. 2 week lab recheck scheduled for 10-15-2019 at 0945. Patient agreeable to date and time of appointment and aware to have fasted at least 8 hours prior to appointment. Future order present for lab work.   Will close encounter.

## 2019-09-30 LAB — CYTOLOGY - PAP: Diagnosis: NEGATIVE

## 2019-10-08 ENCOUNTER — Other Ambulatory Visit: Payer: Self-pay

## 2019-10-09 ENCOUNTER — Ambulatory Visit: Payer: BC Managed Care – PPO

## 2019-10-10 ENCOUNTER — Ambulatory Visit (INDEPENDENT_AMBULATORY_CARE_PROVIDER_SITE_OTHER): Payer: BC Managed Care – PPO

## 2019-10-10 ENCOUNTER — Other Ambulatory Visit: Payer: Self-pay

## 2019-10-10 DIAGNOSIS — E538 Deficiency of other specified B group vitamins: Secondary | ICD-10-CM | POA: Diagnosis not present

## 2019-10-10 MED ORDER — CYANOCOBALAMIN 1000 MCG/ML IJ SOLN
1000.0000 ug | INTRAMUSCULAR | Status: AC
  Administered 2019-10-10 – 2020-09-23 (×12): 1000 ug via INTRAMUSCULAR

## 2019-10-10 NOTE — Progress Notes (Signed)
Cyanocobalamin injection given to left deltoid.  Patient tolerated well. 

## 2019-10-15 ENCOUNTER — Other Ambulatory Visit: Payer: Self-pay

## 2019-10-15 ENCOUNTER — Other Ambulatory Visit (INDEPENDENT_AMBULATORY_CARE_PROVIDER_SITE_OTHER): Payer: BC Managed Care – PPO

## 2019-10-15 DIAGNOSIS — R899 Unspecified abnormal finding in specimens from other organs, systems and tissues: Secondary | ICD-10-CM

## 2019-10-16 LAB — PROLACTIN: Prolactin: 9.8 ng/mL (ref 4.8–23.3)

## 2019-10-23 ENCOUNTER — Other Ambulatory Visit: Payer: Self-pay | Admitting: Nurse Practitioner

## 2019-10-23 DIAGNOSIS — K219 Gastro-esophageal reflux disease without esophagitis: Secondary | ICD-10-CM

## 2019-11-03 ENCOUNTER — Ambulatory Visit: Payer: BC Managed Care – PPO | Admitting: Nurse Practitioner

## 2019-11-03 ENCOUNTER — Other Ambulatory Visit: Payer: Self-pay

## 2019-11-03 ENCOUNTER — Encounter: Payer: Self-pay | Admitting: Nurse Practitioner

## 2019-11-03 VITALS — BP 125/78 | HR 66 | Temp 96.8°F | Resp 20 | Ht 65.0 in | Wt 280.0 lb

## 2019-11-03 DIAGNOSIS — Z6841 Body Mass Index (BMI) 40.0 and over, adult: Secondary | ICD-10-CM

## 2019-11-03 DIAGNOSIS — E538 Deficiency of other specified B group vitamins: Secondary | ICD-10-CM | POA: Diagnosis not present

## 2019-11-03 DIAGNOSIS — K219 Gastro-esophageal reflux disease without esophagitis: Secondary | ICD-10-CM | POA: Diagnosis not present

## 2019-11-03 DIAGNOSIS — R42 Dizziness and giddiness: Secondary | ICD-10-CM | POA: Diagnosis not present

## 2019-11-03 MED ORDER — OMEPRAZOLE 20 MG PO CPDR: 20.0000 mg | DELAYED_RELEASE_CAPSULE | Freq: Every day | ORAL | 1 refills | Status: DC

## 2019-11-03 NOTE — Patient Instructions (Signed)

## 2019-11-03 NOTE — Progress Notes (Signed)
Subjective:    Patient ID: Renee Thomas, female    DOB: 1971-09-25, 48 y.o.   MRN: [id number]   Chief Complaint: Medical Management of Chronic Issues (Problems with balance)    HPI:  1. GERD without esophagitis She is on a daily dose of omeprazole and that seems to keep her symptoms under control.  2. Low serum vitamin B12 She comes onthly fpr b12 injections. Denies fatigue.  3. BMI 45.0-49.9, adult (HCC) Weight is up 5 lbs for last visit Wt Readings from Last 3 Encounters:  11/03/19 280 lb (127 kg)  09/26/19 275 lb (124.7 kg)  08/06/19 275 lb (124.7 kg)   BMI Readings from Last 3 Encounters:  11/03/19 46.59 kg/m  09/26/19 44.72 kg/m  08/06/19 45.76 kg/m       Outpatient Encounter Medications as of 11/03/2019  Medication Sig  . Carboxymethylcellul-Glycerin (CLEAR EYES FOR DRY EYES OP) Place 1-2 drops into both eyes daily as needed.  . cetirizine (ZYRTEC) 10 MG tablet Take 10 mg by mouth daily.  . Cholecalciferol (VITAMIN D PO) Take by mouth daily.  . cyanocobalamin (,VITAMIN B-12,) 1000 MCG/ML injection Inject 1,000 mcg into the muscle every 30 (thirty) days.  . fluticasone (FLONASE) 50 MCG/ACT nasal spray SPRAY 2 SPRAYS INTO EACH NOSTRIL EVERY DAY  . KRILL OIL PO Take 350 mg by mouth daily.   Marland Kitchen omeprazole (PRILOSEC) 20 MG capsule TAKE 1 CAPSULE BY MOUTH EVERY DAY  . Probiotic Product (TRUBIOTICS PO) Take by mouth daily.     History reviewed. No pertinent surgical history.  Family History  Problem Relation Age of Onset  . Hypertension Mother   . Diabetes Mother   . Other Mother        renal disease  . Hypertension Father   . Heart attack Father   . Hypertension Sister   . Diabetes Brother   . Diabetes Brother     New complaints: Patient went to er on NOv 21,2020 with vertigo. She was dx with sinus infecetion and fluid in ears. She has had a couple of episodes since then. Happens almost daily. Denies any falls or syncope.  Social history: lives with  her sister  Controlled substance contract: n/a    Review of Systems  Constitutional: Negative.  Negative for diaphoresis.  Eyes: Negative for pain.  Respiratory: Negative for shortness of breath.   Cardiovascular: Negative for chest pain, palpitations and leg swelling.  Gastrointestinal: Negative for abdominal pain.  Endocrine: Negative for polydipsia.  Skin: Negative for rash.  Neurological: Positive for dizziness. Negative for weakness and headaches.  Hematological: Does not bruise/bleed easily.  All other systems reviewed and are negative.      Objective:   Physical Exam Vitals and nursing note reviewed.  Constitutional:      General: She is not in acute distress.    Appearance: Normal appearance. She is well-developed.  HENT:     Head: Normocephalic.     Nose: Nose normal.  Eyes:     Pupils: Pupils are equal, round, and reactive to light.  Neck:     Vascular: No carotid bruit or JVD.  Cardiovascular:     Rate and Rhythm: Normal rate and regular rhythm.     Heart sounds: Normal heart sounds.  Pulmonary:     Effort: Pulmonary effort is normal. No respiratory distress.     Breath sounds: Normal breath sounds. No wheezing or rales.  Chest:     Chest wall: No tenderness.  Abdominal:  General: Bowel sounds are normal. There is no distension or abdominal bruit.     Palpations: Abdomen is soft. There is no hepatomegaly, splenomegaly, mass or pulsatile mass.     Tenderness: There is no abdominal tenderness.  Musculoskeletal:        General: Normal range of motion.     Cervical back: Normal range of motion and neck supple.  Lymphadenopathy:     Cervical: No cervical adenopathy.  Skin:    General: Skin is warm and dry.  Neurological:     Mental Status: She is alert and oriented to person, place, and time.     Deep Tendon Reflexes: Reflexes are normal and symmetric.  Psychiatric:        Behavior: Behavior normal.        Thought Content: Thought content normal.          Judgment: Judgment normal.    BP 125/78   Pulse 66   Temp (!) 96.8 F (36 C) (Temporal)   Resp 20   Ht '5\' 5"'  (1.651 m)   Wt 280 lb (127 kg)   SpO2 100%   BMI 46.59 kg/m         Assessment & Plan:  Renee Thomas comes in today with chief complaint of Medical Management of Chronic Issues (Problems with balance)   Diagnosis and orders addressed:  1. GERD without esophagitis Avoid spicy foods Do not eat 2 hours prior to bedtime - omeprazole (PRILOSEC) 20 MG capsule; Take 1 capsule (20 mg total) by mouth daily.  Dispense: 90 capsule; Refill: 1  2. Low serum vitamin B12 Continue monthly b12 injections  3. BMI 45.0-49.9, adult (HCC) Discussed diet and exercise for person with BMI >25 Will recheck weight in 3-6 months - CMP14+EGFR - Lipid panel  4. Vertigo Bonine OTC as needed Force fluids flonase nasal spray daily   Labs pending Health Maintenance reviewed Diet and exercise encouraged  Follow up plan: 6 months   [place], FNP

## 2019-11-04 LAB — CMP14+EGFR
ALT: 18 IU/L (ref 0–32)
AST: 17 IU/L (ref 0–40)
Albumin/Globulin Ratio: 1.6 (ref 1.2–2.2)
Albumin: 4.6 g/dL (ref 3.8–4.8)
Alkaline Phosphatase: 109 IU/L (ref 39–117)
BUN/Creatinine Ratio: 19 (ref 9–23)
BUN: 16 mg/dL (ref 6–24)
Bilirubin Total: 0.8 mg/dL (ref 0.0–1.2)
CO2: 25 mmol/L (ref 20–29)
Calcium: 9.4 mg/dL (ref 8.7–10.2)
Chloride: 105 mmol/L (ref 96–106)
Creatinine, Ser: 0.85 mg/dL (ref 0.57–1.00)
GFR calc Af Amer: 92 mL/min/{1.73_m2} (ref >59)
GFR calc non Af Amer: 80 mL/min/{1.73_m2} (ref >59)
Globulin, Total: 2.9 g/dL (ref 1.5–4.5)
Glucose: 78 mg/dL (ref 65–99)
Potassium: 3.9 mmol/L (ref 3.5–5.2)
Sodium: 142 mmol/L (ref 134–144)
Total Protein: 7.5 g/dL (ref 6.0–8.5)

## 2019-11-04 LAB — LIPID PANEL
Chol/HDL Ratio: 3.1 ratio (ref 0.0–4.4)
Cholesterol, Total: 196 mg/dL (ref 100–199)
HDL: 64 mg/dL (ref >39)
LDL Chol Calc (NIH): 121 mg/dL — ABNORMAL HIGH (ref 0–99)
Triglycerides: 57 mg/dL (ref 0–149)
VLDL Cholesterol Cal: 11 mg/dL (ref 5–40)

## 2019-11-10 ENCOUNTER — Other Ambulatory Visit: Payer: Self-pay

## 2019-11-11 ENCOUNTER — Ambulatory Visit: Payer: BC Managed Care – PPO

## 2019-11-12 ENCOUNTER — Other Ambulatory Visit: Payer: Self-pay

## 2019-11-12 ENCOUNTER — Ambulatory Visit (INDEPENDENT_AMBULATORY_CARE_PROVIDER_SITE_OTHER): Payer: BC Managed Care – PPO

## 2019-11-12 DIAGNOSIS — E538 Deficiency of other specified B group vitamins: Secondary | ICD-10-CM | POA: Diagnosis not present

## 2019-12-10 ENCOUNTER — Ambulatory Visit: Payer: BC Managed Care – PPO | Admitting: Family Medicine

## 2019-12-10 ENCOUNTER — Encounter: Payer: Self-pay | Admitting: Family Medicine

## 2019-12-10 ENCOUNTER — Other Ambulatory Visit: Payer: Self-pay

## 2019-12-10 ENCOUNTER — Ambulatory Visit (INDEPENDENT_AMBULATORY_CARE_PROVIDER_SITE_OTHER): Payer: BC Managed Care – PPO

## 2019-12-10 VITALS — BP 163/82 | HR 59 | Temp 97.3°F | Ht 65.0 in | Wt 278.2 lb

## 2019-12-10 DIAGNOSIS — M79671 Pain in right foot: Secondary | ICD-10-CM

## 2019-12-10 DIAGNOSIS — M19071 Primary osteoarthritis, right ankle and foot: Secondary | ICD-10-CM | POA: Diagnosis not present

## 2019-12-10 NOTE — Progress Notes (Signed)
BP (!) 163/82   Pulse (!) 59   Temp (!) 97.3 F (36.3 C) (Temporal)   Ht 5\' 5"  (1.651 m)   Wt 278 lb 3.2 oz (126.2 kg)   SpO2 100%   BMI 46.29 kg/m    Subjective:   Patient ID: , female    DOB: 1971-05-09, 49 y.o.   MRN: [id number]  HPI: Renee Thomas is a 49 y.o. female presenting on 12/10/2019 for Foot Pain (right- x 2 days. NKI)   HPI Patient comes in with complaint of right foot pain that is been going on for 2 days.  That her knee was hurting before and she thinks she might of been walking different on that foot and that may have brought it out, but he is doing better now but the foot is still hurting.  It hurts to step on it and hurts to walk on it.  It hurts less when she is relaxed and not putting pressure on it.  She says the pain can be up to an 8 out of 10 but most of the time it is less.  Patient denies any fevers or chills or redness or warmth or swelling.  She denies any trauma that she knows of.  Relevant past medical, surgical, family and social history reviewed and updated as indicated. Interim medical history since our last visit reviewed. Allergies and medications reviewed and updated.  Review of Systems  Constitutional: Negative for chills and fever.  Respiratory: Negative for chest tightness and shortness of breath.   Cardiovascular: Negative for chest pain and leg swelling.  Musculoskeletal: Positive for arthralgias. Negative for back pain, gait problem and joint swelling.  Skin: Negative for color change, rash and wound.  All other systems reviewed and are negative.   Per HPI unless specifically indicated above   Allergies as of 12/10/2019      Reactions   Other Anaphylaxis   All Tree-Nuts   Peanut-containing Drug Products Anaphylaxis   Eggs Or Egg-derived Products    Swells mouth with itching   Fluarix [flu Virus Vaccine]    Contains egg protein.   Amoxicillin Rash   Penicillins Rash   .Has patient had a PCN reaction causing immediate  rash, facial/tongue/throat swelling, SOB or lightheadedness with hypotension: Yes Has patient had a PCN reaction causing severe rash involving mucus membranes or skin necrosis: No Has patient had a PCN reaction that required hospitalization: no Has patient had a PCN reaction occurring within the last 10 years: no If all of the above answers are "NO", then may proceed with Cephalosporin use.   Sulfa Antibiotics Rash   numbness      Medication List       Accurate as of December 10, 2019 10:10 AM. If you have any questions, ask your nurse or doctor.        cetirizine 10 MG tablet Commonly known as: ZYRTEC Take 10 mg by mouth daily.   CLEAR EYES FOR DRY EYES OP Place 1-2 drops into both eyes daily as needed.   cyanocobalamin 1000 MCG/ML injection Commonly known as: (VITAMIN B-12) Inject 1,000 mcg into the muscle every 30 (thirty) days.   fluticasone 50 MCG/ACT nasal spray Commonly known as: FLONASE SPRAY 2 SPRAYS INTO EACH NOSTRIL EVERY DAY   KRILL OIL PO Take 350 mg by mouth daily.   omeprazole 20 MG capsule Commonly known as: PRILOSEC Take 1 capsule (20 mg total) by mouth daily.   TRUBIOTICS PO Take by mouth  daily.   VITAMIN D PO Take by mouth daily.        Objective:   BP (!) 163/82   Pulse (!) 59   Temp (!) 97.3 F (36.3 C) (Temporal)   Ht 5\' 5"  (1.651 m)   Wt 278 lb 3.2 oz (126.2 kg)   SpO2 100%   BMI 46.29 kg/m   Wt Readings from Last 3 Encounters:  12/10/19 278 lb 3.2 oz (126.2 kg)  11/03/19 280 lb (127 kg)  09/26/19 275 lb (124.7 kg)    Physical Exam Vitals and nursing note reviewed.  Constitutional:      Appearance: Normal appearance.  Musculoskeletal:     Right foot: Normal range of motion and normal capillary refill. Tenderness and bony tenderness present. No swelling, deformity or crepitus. Normal pulse.       Legs:  Skin:    General: Skin is warm.     Findings: No bruising, erythema, lesion or rash.  Neurological:     Mental Status:  She is alert.     Foot x-ray: No acute bony abnormality, await final read from radiology  Assessment & Plan:   Problem List Items Addressed This Visit    None    Visit Diagnoses    Acute foot pain, right    -  Primary   Relevant Orders   DG Foot Complete Right      Likely a tendinitis, recommended anti-inflammatories, she will take Aleve over the next week and if not improved give [place] call back. Follow up plan: Return if symptoms worsen or fail to improve.  Counseling provided for all of the vaccine components Orders Placed This Encounter  Procedures  . DG Foot Complete Right    Caryl Pina, MD [place] 12/10/2019, 10:10 AM

## 2019-12-12 ENCOUNTER — Other Ambulatory Visit: Payer: Self-pay

## 2019-12-15 ENCOUNTER — Other Ambulatory Visit: Payer: Self-pay

## 2019-12-15 ENCOUNTER — Ambulatory Visit (INDEPENDENT_AMBULATORY_CARE_PROVIDER_SITE_OTHER): Payer: BC Managed Care – PPO | Admitting: *Deleted

## 2019-12-15 DIAGNOSIS — E538 Deficiency of other specified B group vitamins: Secondary | ICD-10-CM | POA: Diagnosis not present

## 2020-01-05 ENCOUNTER — Other Ambulatory Visit: Payer: Self-pay | Admitting: Nurse Practitioner

## 2020-01-05 DIAGNOSIS — J0101 Acute recurrent maxillary sinusitis: Secondary | ICD-10-CM

## 2020-01-12 ENCOUNTER — Other Ambulatory Visit: Payer: Self-pay

## 2020-01-13 ENCOUNTER — Ambulatory Visit (INDEPENDENT_AMBULATORY_CARE_PROVIDER_SITE_OTHER): Payer: BC Managed Care – PPO | Admitting: *Deleted

## 2020-01-13 DIAGNOSIS — E538 Deficiency of other specified B group vitamins: Secondary | ICD-10-CM

## 2020-01-13 NOTE — Progress Notes (Signed)
Pt given B12 injection IM right deltoid and tolerated well. 

## 2020-01-13 NOTE — Patient Instructions (Signed)

## 2020-01-18 ENCOUNTER — Other Ambulatory Visit: Payer: Self-pay | Admitting: Nurse Practitioner

## 2020-01-18 DIAGNOSIS — K219 Gastro-esophageal reflux disease without esophagitis: Secondary | ICD-10-CM

## 2020-01-22 DIAGNOSIS — Z1231 Encounter for screening mammogram for malignant neoplasm of breast: Secondary | ICD-10-CM | POA: Diagnosis not present

## 2020-01-26 ENCOUNTER — Encounter: Payer: Self-pay | Admitting: Certified Nurse Midwife

## 2020-02-04 DIAGNOSIS — Z23 Encounter for immunization: Secondary | ICD-10-CM | POA: Diagnosis not present

## 2020-02-16 ENCOUNTER — Other Ambulatory Visit: Payer: Self-pay

## 2020-02-16 ENCOUNTER — Ambulatory Visit (INDEPENDENT_AMBULATORY_CARE_PROVIDER_SITE_OTHER): Payer: BC Managed Care – PPO

## 2020-02-16 DIAGNOSIS — E538 Deficiency of other specified B group vitamins: Secondary | ICD-10-CM

## 2020-02-16 NOTE — Progress Notes (Signed)
Cyanocobalamin injection given to left deltoid.  Patient tolerated well. 

## 2020-02-19 ENCOUNTER — Ambulatory Visit: Payer: BC Managed Care – PPO | Admitting: Family Medicine

## 2020-02-19 ENCOUNTER — Encounter: Payer: Self-pay | Admitting: Family Medicine

## 2020-02-19 ENCOUNTER — Other Ambulatory Visit: Payer: Self-pay

## 2020-02-19 VITALS — BP 134/76 | HR 61 | Temp 97.8°F | Ht 65.0 in | Wt 276.0 lb

## 2020-02-19 DIAGNOSIS — J302 Other seasonal allergic rhinitis: Secondary | ICD-10-CM | POA: Diagnosis not present

## 2020-02-19 DIAGNOSIS — J0140 Acute pansinusitis, unspecified: Secondary | ICD-10-CM | POA: Diagnosis not present

## 2020-02-19 MED ORDER — MONTELUKAST SODIUM 10 MG PO TABS: 10.0000 mg | ORAL_TABLET | Freq: Every day | ORAL | 3 refills | Status: DC

## 2020-02-19 MED ORDER — AZITHROMYCIN 250 MG PO TABS: ORAL_TABLET | ORAL | 0 refills | Status: DC

## 2020-02-19 NOTE — Progress Notes (Signed)
Assessment & Plan:  1. Acute non-recurrent pansinusitis - Education provided on sinusitis.  Discussed symptom management. - azithromycin (ZITHROMAX Z-PAK) 250 MG tablet; Take 2 tablets (500 mg) PO today, then 1 tablet (250 mg) PO daily x4 days.  Dispense: 6 tablet; Refill: 0  2. Seasonal allergies - Continue daily Flonase and antihistamine.  Adding Singulair to see if this will help her allergies going forward. - montelukast (SINGULAIR) 10 MG tablet; Take 1 tablet (10 mg total) by mouth at bedtime.  Dispense: 30 tablet; Refill: 3   Follow up plan: No follow-ups on file.  [name], MSN, APRN, FNP-C [facility]  Subjective:   Patient ID: Renee Thomas, female    DOB: 1971/04/01, 49 y.o.   MRN: [id number]  HPI: Renee Thomas is a 49 y.o. female presenting on 02/19/2020 for Dizziness (Patient states it has been on and off x 2 years but it has been worse x 1 week.)  Patient reports dizziness that has been on and off for the past 2 years but constant for the past week.  She states it does get a little better sometimes but never completely goes away.  Patient had to leave work last Thursday and this Monday because she felt so off balance she felt unsafe at work.  She is also experiencing some pressure in her forehead, top of her head, and her ears, and around her eyes.  She is having some sinus drainage.  She does take Flonase and Zyrtec for allergies.   ROS: Negative unless specifically indicated above in HPI.   Relevant past medical history reviewed and updated as indicated.   Allergies and medications reviewed and updated.   Current Outpatient Medications:  .  Carboxymethylcellul-Glycerin (CLEAR EYES FOR DRY EYES OP), Place 1-2 drops into both eyes daily as needed., Disp: , Rfl:  .  cetirizine (ZYRTEC) 10 MG tablet, Take 10 mg by mouth daily., Disp: , Rfl:  .  Cholecalciferol (VITAMIN D PO), Take by mouth daily., Disp: , Rfl:  .  cyanocobalamin (,VITAMIN  B-12,) 1000 MCG/ML injection, Inject 1,000 mcg into the muscle every 30 (thirty) days., Disp: , Rfl:  .  fluticasone (FLONASE) 50 MCG/ACT nasal spray, SPRAY 2 SPRAYS INTO EACH NOSTRIL EVERY DAY, Disp: 48 mL, Rfl: 1 .  KRILL OIL PO, Take 350 mg by mouth daily. , Disp: , Rfl:  .  omeprazole (PRILOSEC) 20 MG capsule, TAKE 1 CAPSULE BY MOUTH EVERY DAY, Disp: 90 capsule, Rfl: 0 .  Probiotic Product (TRUBIOTICS PO), Take by mouth daily., Disp: , Rfl:   Current Facility-Administered Medications:  .  cyanocobalamin ((VITAMIN B-12)) injection 1,000 mcg, 1,000 mcg, Intramuscular, Q30 days, Daphine Deutscher, [name], FNP, 1,000 mcg at 02/16/20 0847  Allergies  Allergen Reactions  . Other Anaphylaxis    All Tree-Nuts  . Peanut-Containing Drug Products Anaphylaxis  . Eggs Or Egg-Derived Products     Swells mouth with itching  . Fluarix [Flu Virus Vaccine]     Contains egg protein.  Marland Kitchen Amoxicillin Rash  . Penicillins Rash    .Has patient had a PCN reaction causing immediate rash, facial/tongue/throat swelling, SOB or lightheadedness with hypotension: Yes Has patient had a PCN reaction causing severe rash involving mucus membranes or skin necrosis: No Has patient had a PCN reaction that required hospitalization: no Has patient had a PCN reaction occurring within the last 10 years: no If all of the above answers are "NO", then may proceed with Cephalosporin use.   . Sulfa  Antibiotics Rash    numbness    Objective:   BP 134/76   Pulse 61   Temp 97.8 F (36.6 C) (Temporal)   Ht 5\' 5"  (1.651 m)   Wt 276 lb (125.2 kg)   LMP 02/12/2020 (Approximate)   SpO2 100%   BMI 45.93 kg/m    Physical Exam Vitals reviewed.  Constitutional:      General: She is not in acute distress.    Appearance: Normal appearance. She is not ill-appearing, toxic-appearing or diaphoretic.  HENT:     Head: Normocephalic and atraumatic.     Right Ear: Tympanic membrane, ear canal and external ear normal. There is no  impacted cerumen.     Left Ear: Ear canal and external ear normal. A middle ear effusion is present. There is no impacted cerumen.     Nose: Congestion present.     Mouth/Throat:     Mouth: Mucous membranes are moist.     Pharynx: Oropharynx is clear. No oropharyngeal exudate or posterior oropharyngeal erythema.  Eyes:     General: No scleral icterus.       Right eye: No discharge.        Left eye: No discharge.     Conjunctiva/sclera: Conjunctivae normal.  Cardiovascular:     Rate and Rhythm: Normal rate and regular rhythm.     Heart sounds: Normal heart sounds. No murmur. No friction rub. No gallop.   Pulmonary:     Effort: Pulmonary effort is normal. No respiratory distress.     Breath sounds: Normal breath sounds. No stridor. No wheezing, rhonchi or rales.  Musculoskeletal:        General: Normal range of motion.     Cervical back: Normal range of motion.  Lymphadenopathy:     Cervical: No cervical adenopathy.  Skin:    General: Skin is warm and dry.     Capillary Refill: Capillary refill takes less than 2 seconds.  Neurological:     General: No focal deficit present.     Mental Status: She is alert and oriented to person, place, and time. Mental status is at baseline.  Psychiatric:        Mood and Affect: Mood normal.        Behavior: Behavior normal.        Thought Content: Thought content normal.        Judgment: Judgment normal.

## 2020-02-19 NOTE — Patient Instructions (Signed)

## 2020-03-18 ENCOUNTER — Other Ambulatory Visit: Payer: Self-pay

## 2020-03-18 ENCOUNTER — Ambulatory Visit (INDEPENDENT_AMBULATORY_CARE_PROVIDER_SITE_OTHER): Payer: BC Managed Care – PPO | Admitting: *Deleted

## 2020-03-18 DIAGNOSIS — E538 Deficiency of other specified B group vitamins: Secondary | ICD-10-CM | POA: Diagnosis not present

## 2020-04-19 ENCOUNTER — Ambulatory Visit (INDEPENDENT_AMBULATORY_CARE_PROVIDER_SITE_OTHER): Payer: BC Managed Care – PPO

## 2020-04-19 ENCOUNTER — Other Ambulatory Visit: Payer: Self-pay

## 2020-04-19 DIAGNOSIS — E538 Deficiency of other specified B group vitamins: Secondary | ICD-10-CM | POA: Diagnosis not present

## 2020-04-19 NOTE — Progress Notes (Signed)
B12 given and tolerated well °

## 2020-04-27 ENCOUNTER — Encounter (HOSPITAL_COMMUNITY): Payer: Self-pay | Admitting: Emergency Medicine

## 2020-04-27 ENCOUNTER — Emergency Department (HOSPITAL_COMMUNITY): Payer: BC Managed Care – PPO

## 2020-04-27 ENCOUNTER — Emergency Department (HOSPITAL_COMMUNITY)
Admission: EM | Admit: 2020-04-27 | Discharge: 2020-04-27 | Disposition: A | Discharge status: Home / Self Care | Payer: BC Managed Care – PPO | Attending: Emergency Medicine | Admitting: Emergency Medicine

## 2020-04-27 ENCOUNTER — Other Ambulatory Visit: Payer: Self-pay

## 2020-04-27 DIAGNOSIS — R079 Chest pain, unspecified: Secondary | ICD-10-CM | POA: Insufficient documentation

## 2020-04-27 DIAGNOSIS — R0789 Other chest pain: Secondary | ICD-10-CM | POA: Diagnosis not present

## 2020-04-27 LAB — BASIC METABOLIC PANEL
Anion gap: 7 (ref 5–15)
BUN: 19 mg/dL (ref 6–20)
CO2: 23 mmol/L (ref 22–32)
Calcium: 8.9 mg/dL (ref 8.9–10.3)
Chloride: 108 mmol/L (ref 98–111)
Creatinine, Ser: 0.76 mg/dL (ref 0.44–1.00)
GFR calc Af Amer: >60 mL/min (ref >60)
GFR calc non Af Amer: >60 mL/min (ref >60)
Glucose, Bld: 92 mg/dL (ref 70–99)
Potassium: 3.5 mmol/L (ref 3.5–5.1)
Sodium: 138 mmol/L (ref 135–145)

## 2020-04-27 LAB — CBC
HCT: 37.6 % (ref 36.0–46.0)
Hemoglobin: 11.8 g/dL — ABNORMAL LOW (ref 12.0–15.0)
MCH: 29.6 pg (ref 26.0–34.0)
MCHC: 31.4 g/dL (ref 30.0–36.0)
MCV: 94.5 fL (ref 80.0–100.0)
Platelets: 263 10*3/uL (ref 150–400)
RBC: 3.98 MIL/uL (ref 3.87–5.11)
RDW: 13.9 % (ref 11.5–15.5)
WBC: 4.8 10*3/uL (ref 4.0–10.5)
nRBC: 0 % (ref 0.0–0.2)

## 2020-04-27 LAB — TROPONIN I (HIGH SENSITIVITY): Troponin I (High Sensitivity): 4 ng/L (ref <18)

## 2020-04-27 LAB — POC URINE PREG, ED: Preg Test, Ur: NEGATIVE

## 2020-04-27 NOTE — Discharge Instructions (Addendum)
You were evaluated in the Emergency Department and after careful evaluation, we did not find any emergent condition requiring admission or further testing in the hospital.  Your exam/testing today was overall reassuring.  No evidence of heart attack or heart damage in the blood testing today.  Symptoms likely due to muscle strain or sprain.  Please return to the Emergency Department if you experience any worsening of your condition.  We encourage you to follow up with a primary care provider.  Thank you for allowing [place] to be a part of your care.

## 2020-04-27 NOTE — ED Provider Notes (Signed)
[facility]-EMERGENCY DEPT [facility] Emergency Department Provider Note MRN:  [id number]  Arrival date & time: 04/27/20     Chief Complaint   Chest Pain   History of Present Illness   Renee Thomas is a 49 y.o. year-old female with a history of GERD presenting to the ED with chief complaint of chest pain.  Location: Left lower chest Duration: Few minutes, occurring at 3 AM this morning Onset: Sudden Timing: Intermittent, 3 separate brief episodes of chest pain Description: Sharp, like a "catch" Severity: Mild Exacerbating/Alleviating Factors: None; was lifting objects at work at the time Associated Symptoms: None Pertinent Negatives: Denies dizziness or diaphoresis, no nausea or vomiting, no trouble breathing   Review of Systems  A complete 10 system review of systems was obtained and all systems are negative except as noted in the HPI and PMH.   Patient's Health History    Past Medical History:  Diagnosis Date  . Anemia   . GERD (gastroesophageal reflux disease)   . MVP (mitral valve prolapse)   . PVC's (premature ventricular contractions)     History reviewed. No pertinent surgical history.  Family History  Problem Relation Age of Onset  . Hypertension Mother   . Diabetes Mother   . Other Mother        renal disease  . Hypertension Father   . Heart attack Father   . Hypertension Sister   . Diabetes Brother   . Diabetes Brother     Social History   Socioeconomic History  . Marital status: Single    Spouse name: Not on file  . Number of children: Not on file  . Years of education: Not on file  . Highest education level: Not on file  Occupational History  . Not on file  Tobacco Use  . Smoking status: Never Smoker  . Smokeless tobacco: Never Used  Vaping Use  . Vaping Use: Never used  Substance and Sexual Activity  . Alcohol use: No    Alcohol/week: 0.0 standard drinks  . Drug use: No  . Sexual activity: Not Currently    Partners: Male    Birth  control/protection: Abstinence  Other Topics Concern  . Not on file  Social History Narrative  . Not on file   Social Determinants of Health   Financial Resource Strain:   . Difficulty of Paying Living Expenses:   Food Insecurity:   . Worried About Programme researcher, broadcasting/film/video in the Last Year:   . Barista in the Last Year:   Transportation Needs:   . Freight forwarder (Medical):   Marland Kitchen Lack of Transportation (Non-Medical):   Physical Activity:   . Days of Exercise per Week:   . Minutes of Exercise per Session:   Stress:   . Feeling of Stress :   Social Connections:   . Frequency of Communication with Friends and Family:   . Frequency of Social Gatherings with Friends and Family:   . Attends Religious Services:   . Active Member of Clubs or Organizations:   . Attends Banker Meetings:   Marland Kitchen Marital Status:   Intimate Partner Violence:   . Fear of Current or Ex-Partner:   . Emotionally Abused:   Marland Kitchen Physically Abused:   . Sexually Abused:      Physical Exam   Vitals:   04/27/20 0630 04/27/20 0800  BP: (!) 158/91 (!) 152/92  Pulse: (!) 58 (!) 58  Resp: 18 16  Temp: 98.4 F (  36.9 C) 97.7 F (36.5 C)  SpO2: 100% 100%    CONSTITUTIONAL: Well-appearing, NAD NEURO:  Alert and oriented x 3, no focal deficits EYES:  eyes equal and reactive ENT/NECK:  no LAD, no JVD CARDIO: Regular rate, well-perfused, normal S1 and S2 PULM:  CTAB no wheezing or rhonchi GI/GU:  normal bowel sounds, non-distended, non-tender MSK/SPINE:  No gross deformities, no edema SKIN:  no rash, atraumatic PSYCH:  Appropriate speech and behavior  *Additional and/or pertinent findings included in MDM below  Diagnostic and Interventional Summary    EKG Interpretation  Date/Time:  Tuesday April 27 2020 06:30:34 EDT Ventricular Rate:  62 PR Interval:  174 QRS Duration: 82 QT Interval:  374 QTC Calculation: 379 R Axis:   13 Text Interpretation: Normal sinus rhythm Nonspecific T wave  abnormality Since last tracing 12 Aug 2017 Premature ventricular complexes are no longer present Confirmed by Rolland Porter [phone]) on 04/27/2020 6:38:44 AM      Labs Reviewed  CBC - Abnormal; Notable for the following components:      Result Value   Hemoglobin 11.8 (*)    All other components within normal limits  BASIC METABOLIC PANEL  POC URINE PREG, ED  TROPONIN I (HIGH SENSITIVITY)    DG Chest 2 View  Final Result      Medications - No data to display   Procedures  /  Critical Care Procedures  ED Course and Medical Decision Making  I have reviewed the triage vital signs, the nursing notes, and pertinent available records from the EMR.  Listed above are laboratory and imaging tests that I personally ordered, reviewed, and interpreted and then considered in my medical decision making (see below for details).      Chest pain favored to be musculoskeletal, doubt cardiac etiology given patient's lack of risk factors and her lack of associated symptoms and the fact that it seemed to occur after lifting objects.  EKG is reassuring, troponin is negative.  No need for second troponin as the chest pain occurred greater than 4 hours prior to troponin evaluation.  No evidence of DVT, no shortness of breath, doubt PE.  Appropriate for discharge.  Advise follow-up with PCP or cardiologist to discuss outpatient stress testing.    Barth Kirks. Sedonia Small, [place] [email or web address]  Final Clinical Impressions(s) / ED Diagnoses     ICD-10-CM   1. Chest pain, unspecified type  R07.9     ED Discharge Orders    None       Discharge Instructions Discussed with and Provided to Patient:     Discharge Instructions     You were evaluated in the Emergency Department and after careful evaluation, we did not find any emergent condition requiring admission or further testing in the hospital.  Your exam/testing today was overall reassuring.   No evidence of heart attack or heart damage in the blood testing today.  Symptoms likely due to muscle strain or sprain.  Please return to the Emergency Department if you experience any worsening of your condition.  We encourage you to follow up with a primary care provider.  Thank you for allowing [place] to be a part of your care.   ,c    Maudie Flakes, MD 04/27/20 [phone]

## 2020-04-27 NOTE — ED Triage Notes (Signed)
Pt was at work this morning and starting to have a "catching" sensation on the left side of her chest.

## 2020-04-27 NOTE — ED Notes (Signed)
Pt reports was at work last night and felt like she had a "catch" in her left chest.  Says the pain was brief and happened approx 3 times throughout the night.  Reports had one episode of feeling like she was going to pass out.  Reports has had the light headedness before and was diagnosed with vertigo.  Denies any pain or light headedness at this time.

## 2020-05-16 ENCOUNTER — Other Ambulatory Visit: Payer: Self-pay | Admitting: Family Medicine

## 2020-05-16 DIAGNOSIS — J302 Other seasonal allergic rhinitis: Secondary | ICD-10-CM

## 2020-05-17 DIAGNOSIS — K0889 Other specified disorders of teeth and supporting structures: Secondary | ICD-10-CM | POA: Diagnosis not present

## 2020-05-17 DIAGNOSIS — Z6841 Body Mass Index (BMI) 40.0 and over, adult: Secondary | ICD-10-CM | POA: Diagnosis not present

## 2020-05-19 ENCOUNTER — Other Ambulatory Visit: Payer: Self-pay

## 2020-05-19 ENCOUNTER — Ambulatory Visit (INDEPENDENT_AMBULATORY_CARE_PROVIDER_SITE_OTHER): Payer: BC Managed Care – PPO | Admitting: *Deleted

## 2020-05-19 DIAGNOSIS — E538 Deficiency of other specified B group vitamins: Secondary | ICD-10-CM | POA: Diagnosis not present

## 2020-05-19 NOTE — Progress Notes (Signed)
Patient in today for B 12 injection. 1000 mcg given IM in right deltoid. Patient tolerated well.  

## 2020-06-03 ENCOUNTER — Encounter: Payer: Self-pay | Admitting: Certified Nurse Midwife

## 2020-06-21 ENCOUNTER — Other Ambulatory Visit: Payer: Self-pay

## 2020-06-21 ENCOUNTER — Ambulatory Visit (INDEPENDENT_AMBULATORY_CARE_PROVIDER_SITE_OTHER): Payer: BC Managed Care – PPO | Admitting: Family Medicine

## 2020-06-21 DIAGNOSIS — E538 Deficiency of other specified B group vitamins: Secondary | ICD-10-CM | POA: Diagnosis not present

## 2020-07-22 ENCOUNTER — Other Ambulatory Visit: Payer: Self-pay

## 2020-07-22 ENCOUNTER — Ambulatory Visit (INDEPENDENT_AMBULATORY_CARE_PROVIDER_SITE_OTHER): Payer: BC Managed Care – PPO

## 2020-07-22 DIAGNOSIS — E538 Deficiency of other specified B group vitamins: Secondary | ICD-10-CM | POA: Diagnosis not present

## 2020-07-25 DIAGNOSIS — R4020 Unspecified coma: Secondary | ICD-10-CM | POA: Diagnosis not present

## 2020-07-25 DIAGNOSIS — I491 Atrial premature depolarization: Secondary | ICD-10-CM | POA: Diagnosis not present

## 2020-07-25 DIAGNOSIS — R404 Transient alteration of awareness: Secondary | ICD-10-CM | POA: Diagnosis not present

## 2020-07-25 DIAGNOSIS — R0689 Other abnormalities of breathing: Secondary | ICD-10-CM | POA: Diagnosis not present

## 2020-07-29 NOTE — Progress Notes (Signed)
Cardiology Office Note  Date: 07/30/2020   ID: MEHEK GREGA, DOB 1971-10-02, MRN [id number]  PCP:  Bennie Pierini, FNP  Cardiologist:  Dina Rich, MD Electrophysiologist:  None   Chief Complaint: Follow-up PVCs  History of Present Illness: Renee Thomas is a 49 y.o. female with a history of PVCs, dizziness, GERD, anemia, mitral valve prolapse.  Last seen by Dr. Wyline Mood 08/27/2017: She is had a recent ER visit with dizziness.  Overall work-up was negative orthostatics were negative and there were no recurrent episodes.  Having occasional palpitations mainly with lying down going to sleep.  Could occur with stress.  Over the prior month could have some positional back and chest pain.  24-hour Holter monitor was ordered to quantify PVC burden.  Pending Holter monitor results consider work-up for structural heart disease with possible echo and/or stress test.  Subsequent stress test and echocardiogram were ordered.  Here for follow-up.  Patient complaining of recent episodes of near syncope and called EMS.  She states she has been having some issues with balance and sinus issues.  EMS noted some PVCs on her EKG when they checked it at her home.  She states her blood pressure was low initially while sitting at 90/59 and subsequently after standing was 100/96.  She does not remember what her heart rate was at that time.  He states she is going to see her PCP for her balance issues.  He states when this issue occurred the other day she turned her head and suddenly became dizzy which improved but it later recurred but she never had a frank syncopal episode.  States she has chronic issues with sinus infections and ear issues.  EKG today shows sinus bradycardia with occasional premature ventricular complexes with a rate of 59, nonspecific T wave abnormalities.  She states these ear issues and balance issues have been going on for several months.  Blood pressure today is elevated at 158/98.   Patient states she does not check her blood pressure regularly at home.  She has no formal diagnosis of hypertension and not on any antihypertensive medications.  Past Medical History:  Diagnosis Date  . Anemia   . GERD (gastroesophageal reflux disease)   . MVP (mitral valve prolapse)   . PVC's (premature ventricular contractions)     No past surgical history on file.  Current Outpatient Medications  Medication Sig Dispense Refill  . Carboxymethylcellul-Glycerin (CLEAR EYES FOR DRY EYES OP) Place 1-2 drops into both eyes daily as needed.    . cetirizine (ZYRTEC) 10 MG tablet Take 10 mg by mouth daily.    . Cholecalciferol (VITAMIN D PO) Take by mouth daily.    . cyanocobalamin (,VITAMIN B-12,) 1000 MCG/ML injection Inject 1,000 mcg into the muscle every 30 (thirty) days.    . fluticasone (FLONASE) 50 MCG/ACT nasal spray SPRAY 2 SPRAYS INTO EACH NOSTRIL EVERY DAY 48 mL 1  . KRILL OIL PO Take 350 mg by mouth daily.     . montelukast (SINGULAIR) 10 MG tablet TAKE 1 TABLET BY MOUTH EVERYDAY AT BEDTIME 90 tablet 1  . omeprazole (PRILOSEC) 20 MG capsule TAKE 1 CAPSULE BY MOUTH EVERY DAY 90 capsule 0  . Probiotic Product (TRUBIOTICS PO) Take by mouth daily.     Current Facility-Administered Medications  Medication Dose Route Frequency Provider Last Rate Last Admin  . cyanocobalamin ((VITAMIN B-12)) injection 1,000 mcg  1,000 mcg Intramuscular Q30 days Bennie Pierini, FNP   1,000 mcg at 07/22/20 [phone]  Allergies:  Other, Peanut-containing drug products, Eggs or egg-derived products, Fluarix [flu virus vaccine], Amoxicillin, Penicillins, and Sulfa antibiotics   Social History: The patient  reports that she has never smoked. She has never used smokeless tobacco. She reports that she does not drink alcohol and does not use drugs.   Family History: The patient's family history includes Diabetes in her brother, brother, and mother; Heart attack in her father; Hypertension in her father,  mother, and sister; Other in her mother.   ROS:  Please see the history of present illness. Otherwise, complete review of systems is positive for none.  All other systems are reviewed and negative.   Physical Exam: VS:  BP (!) 158/98   Pulse 63   Ht 5\' 5"  (1.651 m)   Wt 273 lb (123.8 kg)   SpO2 98%   BMI 45.43 kg/m , BMI Body mass index is 45.43 kg/m.  Wt Readings from Last 3 Encounters:  07/30/20 273 lb (123.8 kg)  04/27/20 280 lb (127 kg)  02/19/20 276 lb (125.2 kg)    General: Morbidly obese patient appears comfortable at rest. Neck: Supple, no elevated JVP or carotid bruits, no thyromegaly. Lungs: Clear to auscultation, nonlabored breathing at rest. Cardiac: Regular rate and rhythm, no S3 or significant systolic murmur, no pericardial rub. Extremities: No pitting edema, distal pulses 2+. Skin: Warm and dry. Musculoskeletal: No kyphosis. Neuropsychiatric: Alert and oriented x3, affect grossly appropriate.  ECG:  An ECG dated July 30, 2020 was personally reviewed today and demonstrated:  Sinus bradycardia with occasional premature ventricular complexes rate of 59, nonspecific T wave abnormality  Recent Labwork: 09/26/2019: TSH 2.260 11/03/2019: ALT 18; AST 17 04/27/2020: BUN 19; Creatinine, Ser 0.76; Hemoglobin 11.8; Platelets 263; Potassium 3.5; Sodium 138     Component Value Date/Time   CHOL 196 11/03/2019 1411   TRIG 57 11/03/2019 1411   HDL 64 11/03/2019 1411   CHOLHDL 3.1 11/03/2019 1411   CHOLHDL 2.7 08/25/2016 0935   VLDL 11 08/25/2016 0935   LDLCALC 121 (H) 11/03/2019 1411    Other Studies Reviewed Today:  NST 10/17/2017 Study Result  Narrative & Impression   There was no ST segment deviation noted during stress. Frequent PVC's seen throughout study.  The study is normal. No myocardial ischemia or scar.  This is a low risk study.  Nuclear stress EF: 51%.    Echocardiogram 10/03/2017 Study Conclusions   - Left ventricle: The cavity size was  normal. Wall thickness was at  the upper limits of normal. Systolic function was normal. The  estimated ejection fraction was in the range of 55% to 60%. Wall  motion was normal; there were no regional wall motion  abnormalities. The study is not technically sufficient to allow  evaluation of LV diastolic function.  - Aortic valve: Mean gradient (S): 5 mm Hg. Valve area (VTI): 2.25  cm^2.  - Mitral valve: There was trivial regurgitation.  - Left atrium: The atrium was mildly dilated.  - Right atrium: Central venous pressure (est): 3 mm Hg.  - Atrial septum: No defect or patent foramen ovale was identified.  - Tricuspid valve: There was trivial regurgitation.  - Pulmonary arteries: PA peak pressure: 28 mm Hg (S).  - Pericardium, extracardiac: There was no pericardial effusion.   Impressions:  - Upper normal LV wall thickness with LVEF 55-60% and indeterminate  diastolic function. Mild left atrial enlargement. Trivial mitral  regurgitation. Trivial tricuspid regurgitation with estimated  PASP 28 mmHg.  Holter Monitor 08/28/2017 Study Highlights  Sinus rhythm  Rates 53 to 114 bpm  Average HR 76 bpm  Frequent PVCs  (13% total)  12 couplets, 5 triplets     Assessment and Plan:  1. PVC's (premature ventricular contractions)   2. Chest pain of uncertain etiology    1. PVC's (premature ventricular contractions) Patient states she is not many issues with PVCs.  EKG today shows sinus bradycardia with occasional premature ventricular complexes rate of 59, nonspecific T wave abnormality.  States she recently had an episode of dizziness and near syncope.  She has had some issues with her ear in the recent past and is going to see her PCP today.  Also has issues with sinus problems.  She believes the dizziness to be related to her ear.  2. Chest pain of uncertain etiology Denies any recent progressive anginal or exertional symptoms.   3.  Elevated blood pressure  without diagnosis of hypertension. Blood pressure is elevated today at 158/98.  Advised the patient to purchase a blood pressure cuff and start checking her blood pressures.  Advised her the goal of her blood pressure is 130/80.  If she continues to have sustained blood pressures greater than 130/80 we will need to treat or her PCP can treat.  Patient verbalizes understanding.  Medication Adjustments/Labs and Tests Ordered: Current medicines are reviewed at length with the patient today.  Concerns regarding medicines are outlined above.   Disposition: Follow-up with Dr. Wyline Mood or APP 6 months  Signed, Rennis Harding, NP 07/30/2020 9:14 AM    [facility] [address] [place], [place], [place] [address] Phone: [phone]; Fax: [phone]

## 2020-07-30 ENCOUNTER — Encounter: Payer: Self-pay | Admitting: Family

## 2020-07-30 ENCOUNTER — Ambulatory Visit (INDEPENDENT_AMBULATORY_CARE_PROVIDER_SITE_OTHER): Payer: BC Managed Care – PPO | Admitting: Family Medicine

## 2020-07-30 ENCOUNTER — Ambulatory Visit: Payer: BC Managed Care – PPO | Admitting: Family

## 2020-07-30 ENCOUNTER — Other Ambulatory Visit: Payer: Self-pay

## 2020-07-30 ENCOUNTER — Encounter: Payer: Self-pay | Admitting: Family Medicine

## 2020-07-30 VITALS — BP 123/69 | HR 56 | Temp 97.6°F | Ht 65.0 in | Wt 272.8 lb

## 2020-07-30 VITALS — BP 158/98 | HR 63 | Ht 65.0 in | Wt 273.0 lb

## 2020-07-30 DIAGNOSIS — R2689 Other abnormalities of gait and mobility: Secondary | ICD-10-CM | POA: Diagnosis not present

## 2020-07-30 DIAGNOSIS — H9312 Tinnitus, left ear: Secondary | ICD-10-CM | POA: Diagnosis not present

## 2020-07-30 DIAGNOSIS — H938X2 Other specified disorders of left ear: Secondary | ICD-10-CM | POA: Diagnosis not present

## 2020-07-30 DIAGNOSIS — I493 Ventricular premature depolarization: Secondary | ICD-10-CM

## 2020-07-30 DIAGNOSIS — R42 Dizziness and giddiness: Secondary | ICD-10-CM

## 2020-07-30 DIAGNOSIS — R079 Chest pain, unspecified: Secondary | ICD-10-CM | POA: Diagnosis not present

## 2020-07-30 MED ORDER — FEXOFENADINE HCL 180 MG PO TABS: 180.0000 mg | ORAL_TABLET | Freq: Every day | ORAL | 1 refills | Status: DC

## 2020-07-30 NOTE — Patient Instructions (Addendum)

## 2020-07-30 NOTE — Patient Instructions (Signed)
Meniere Disease  Meniere disease is an inner ear disorder. It causes attacks of a spinning sensation (vertigo), dizziness, and ringing in the ear (tinnitus). It also causes hearing loss and a feeling of fullness or pressure in the ear. This is a lifelong condition, and it may get worse over time. You may have drop attacks or severe dizziness that makes you fall. A drop attack is when you suddenly fall without losing consciousness and you quickly recover after a few seconds or minutes. What are the causes? This condition is caused by having too much of the fluid that is in your inner ear (endolymph). When fluid builds up in your inner ear, it affects the nerves that control balance and hearing. The reason for the fluid buildup is not known. Possible causes include:  Allergies.  An abnormal reaction of the body's defense system (autoimmune disease).  Viral infection of the inner ear.  Head injury. What increases the risk? You are more likely to develop this condition if:  You are older than age 40.  You have a family history of Meniere disease.  You have a history of autoimmune disease.  You have a history of migraine headaches. What are the signs or symptoms? Symptoms of this condition can come and go and may last for up to 4 hours at a time. Symptoms usually start in one ear. They may become more frequent and eventually involve both ears. Symptoms can include:  Fullness and pressure in your ear.  Roaring or ringing in your ear.  Vertigo and loss of balance.  Dizziness.  Decreased hearing.  Nausea and vomiting. How is this diagnosed? This condition is diagnosed based on:  A physical exam.  Tests , such as: ? A hearing test (audiogram). ? An electronystagmogram. This tests your balance nerve (vestibular nerve). ? Imaging studies of your inner ear, such as CT scan or MRI. ? Other balance tests, such as rotational or balance platform tests. How is this treated? There is  no cure for this condition, but treatment can help to manage your symptoms. Treatment may include:  A low-salt diet. Limiting salt may help to reduce fluid in the body and relieve symptoms.  Oral or injected medicines to reduce or control: ? Vertigo. ? Nausea. ? Fluid retention. ? Dizziness.  Use of an air pressure pulse generator. This is a machine that sends small pressure pulses into your ear canal.  Hearing aids.  Inner ear surgery. This is rare. When you have symptoms, it can be helpful to lie down on a flat surface and focus your eyes on one object that does not move. Try to stay in that position until your symptoms go away. Follow these instructions at home: Eating and drinking  Eat the same amount of food at the same time every day, including snacks.  Do not skip meals.  Avoid caffeine.  Drink enough fluids to keep your urine clear or pale yellow.  Limit alcoholic drinks to one drink a day for non-pregnant women and 2 drinks a day for men. One drink equals 12 oz of beer, 5 oz of wine, or 1 oz of hard liquor.  Limit the salt (sodium) in your diet as told by your health care provider. Check ingredients and nutrition facts on packaged foods and beverages.  Do not eat foods that contain monosodium glutamate (MSG). General instructions  Do not use any products that contain nicotine or tobacco, such as cigarettes and e-cigarettes. If you need help quitting, ask your   health care provider.  Take over-the-counter and prescription medicines only as told by your health care provider.  Find ways to reduce or avoid stress. If you need help with this, ask your health care provider.  Do not drive if you have vertigo or dizziness. Contact a health care provider if:  You have symptoms that last longer than 4 hours.  You have new or worse symptoms. Get help right away if:  You have been vomiting for 24 hours.  You cannot keep fluids down.  You have chest pain or trouble  breathing. Summary  Meniere disease is an inner ear disorder. It causes attacks of a spinning sensation (vertigo), dizziness, and ringing in the ear (tinnitus). It also causes hearing loss and a feeling of fullness or pressure in the ear.  Symptoms of this condition can come and go and may last for up to 4 hours at a time.  When you have symptoms, it can be helpful to lie down on a flat surface and focus your eyes on one object that does not move. Try to stay in that position until your symptoms go away. This information is not intended to replace advice given to you by your health care provider. Make sure you discuss any questions you have with your health care provider. Document Revised: 10/05/2017 Document Reviewed: 09/13/2016 Elsevier Patient Education  2020 [employer] 

## 2020-07-30 NOTE — Progress Notes (Signed)
Subjective:    Patient ID: Renee Thomas, female    DOB: Jun 27, 1971, 49 y.o.   MRN: [id number]  Chief Complaint  Patient presents with  . Ear Fullness    LEFT EAR STARTED wENSDAY RINGING ALSO OFF BALANCE    PT presents to the office today with left ear fullness, dizziness, sinus pressure, and tinnitus that started 3-4 days ago. However, she has been "off balance" intermittent over the last 3 years. She has PVC's and thought this was causing it and saw a Cardiologists today.  Ear Fullness  There is pain in the left ear. This is a new problem. The current episode started in the past 7 days. The problem occurs constantly. The problem has been gradually worsening. There has been no fever. The pain is at a severity of 1/10. The pain is mild. Associated symptoms include headaches. Pertinent negatives include no coughing, ear discharge, hearing loss, rhinorrhea or sore throat. Associated symptoms comments: Ringing of ear and heaviness of ear. The treatment provided no relief.      Review of Systems  HENT: Negative for ear discharge, hearing loss, rhinorrhea and sore throat.   Respiratory: Negative for cough.   Neurological: Positive for headaches.  All other systems reviewed and are negative.      Objective:   Physical Exam Vitals reviewed.  Constitutional:      General: She is not in acute distress.    Appearance: She is well-developed.  HENT:     Head: Normocephalic and atraumatic.     Right Ear: No swelling or tenderness. A middle ear effusion is present.     Left Ear: No swelling or tenderness. A middle ear effusion is present.  Eyes:     Pupils: Pupils are equal, round, and reactive to light.  Neck:     Thyroid: No thyromegaly.  Cardiovascular:     Rate and Rhythm: Normal rate and regular rhythm.     Heart sounds: Normal heart sounds. No murmur heard.   Pulmonary:     Effort: Pulmonary effort is normal. No respiratory distress.     Breath sounds: Normal breath sounds. No  wheezing.  Abdominal:     General: Bowel sounds are normal. There is no distension.     Palpations: Abdomen is soft.     Tenderness: There is no abdominal tenderness.  Musculoskeletal:        General: No tenderness. Normal range of motion.     Cervical back: Normal range of motion and neck supple.  Skin:    General: Skin is warm and dry.  Neurological:     Mental Status: She is alert and oriented to person, place, and time.     Cranial Nerves: No cranial nerve deficit.     Deep Tendon Reflexes: Reflexes are normal and symmetric.  Psychiatric:        Behavior: Behavior normal.        Thought Content: Thought content normal.        Judgment: Judgment normal.       BP 123/69   Pulse (!) 56   Temp 97.6 F (36.4 C) (Temporal)   Ht 5\' 5"  (1.651 m)   Wt 272 lb 12.8 oz (123.7 kg)   SpO2 100%   BMI 45.40 kg/m      Assessment & Plan:  Renee Thomas comes in today with chief complaint of Ear Fullness (LEFT EAR STARTED wENSDAY RINGING ALSO OFF BALANCE )   Diagnosis and orders addressed:  1.  Sensation of fullness in left ear - Ambulatory referral to ENT  2. Tinnitus of left ear - Ambulatory referral to ENT  3. Dizziness - Ambulatory referral to ENT  4. 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

## 2020-08-02 ENCOUNTER — Telehealth: Payer: Self-pay | Admitting: Nurse Practitioner

## 2020-08-04 ENCOUNTER — Telehealth: Payer: Self-pay | Admitting: Nurse Practitioner

## 2020-08-04 NOTE — Telephone Encounter (Signed)
LMTCB

## 2020-08-04 NOTE — Telephone Encounter (Signed)
Pt is aware that Jannifer Rodney is not in office today. Message will be sent to [facility] and we will call pt tomorrow. Symptoms are no worse, just no better

## 2020-08-05 NOTE — Telephone Encounter (Signed)
FMLA filled out & placed on providers desk

## 2020-08-05 NOTE — Telephone Encounter (Signed)
Needs to keep appt with ENT. Can we check on the status of this?

## 2020-08-07 ENCOUNTER — Other Ambulatory Visit: Payer: Self-pay

## 2020-08-07 ENCOUNTER — Encounter (HOSPITAL_COMMUNITY): Payer: Self-pay

## 2020-08-07 ENCOUNTER — Emergency Department (HOSPITAL_COMMUNITY): Payer: BC Managed Care – PPO

## 2020-08-07 ENCOUNTER — Emergency Department (HOSPITAL_COMMUNITY)
Admission: EM | Admit: 2020-08-07 | Discharge: 2020-08-07 | Disposition: A | Discharge status: Home / Self Care | Payer: BC Managed Care – PPO | Attending: Emergency Medicine | Admitting: Emergency Medicine

## 2020-08-07 DIAGNOSIS — Z91010 Allergy to peanuts: Secondary | ICD-10-CM | POA: Insufficient documentation

## 2020-08-07 DIAGNOSIS — I10 Essential (primary) hypertension: Secondary | ICD-10-CM | POA: Diagnosis not present

## 2020-08-07 DIAGNOSIS — R519 Headache, unspecified: Secondary | ICD-10-CM | POA: Diagnosis not present

## 2020-08-07 DIAGNOSIS — R0902 Hypoxemia: Secondary | ICD-10-CM | POA: Diagnosis not present

## 2020-08-07 DIAGNOSIS — H9319 Tinnitus, unspecified ear: Secondary | ICD-10-CM | POA: Insufficient documentation

## 2020-08-07 DIAGNOSIS — R42 Dizziness and giddiness: Secondary | ICD-10-CM | POA: Diagnosis not present

## 2020-08-07 DIAGNOSIS — R0989 Other specified symptoms and signs involving the circulatory and respiratory systems: Secondary | ICD-10-CM | POA: Diagnosis not present

## 2020-08-07 DIAGNOSIS — R0982 Postnasal drip: Secondary | ICD-10-CM | POA: Diagnosis not present

## 2020-08-07 DIAGNOSIS — R55 Syncope and collapse: Secondary | ICD-10-CM | POA: Insufficient documentation

## 2020-08-07 LAB — CBC
HCT: 39.9 % (ref 36.0–46.0)
Hemoglobin: 12.8 g/dL (ref 12.0–15.0)
MCH: 29.4 pg (ref 26.0–34.0)
MCHC: 32.1 g/dL (ref 30.0–36.0)
MCV: 91.5 fL (ref 80.0–100.0)
Platelets: 321 10*3/uL (ref 150–400)
RBC: 4.36 MIL/uL (ref 3.87–5.11)
RDW: 13.4 % (ref 11.5–15.5)
WBC: 4.9 10*3/uL (ref 4.0–10.5)
nRBC: 0 % (ref 0.0–0.2)

## 2020-08-07 LAB — BASIC METABOLIC PANEL
Anion gap: 9 (ref 5–15)
BUN: 17 mg/dL (ref 6–20)
CO2: 26 mmol/L (ref 22–32)
Calcium: 9.5 mg/dL (ref 8.9–10.3)
Chloride: 103 mmol/L (ref 98–111)
Creatinine, Ser: 0.77 mg/dL (ref 0.44–1.00)
GFR calc Af Amer: >60 mL/min (ref >60)
GFR calc non Af Amer: >60 mL/min (ref >60)
Glucose, Bld: 95 mg/dL (ref 70–99)
Potassium: 4.4 mmol/L (ref 3.5–5.1)
Sodium: 138 mmol/L (ref 135–145)

## 2020-08-07 MED ORDER — MECLIZINE HCL 12.5 MG PO TABS
25.0000 mg | ORAL_TABLET | Freq: Once | ORAL | Status: AC
  Administered 2020-08-07: 25 mg via ORAL
  Filled 2020-08-07: qty 2

## 2020-08-07 MED ORDER — MECLIZINE HCL 25 MG PO TABS
25.0000 mg | ORAL_TABLET | Freq: Three times a day (TID) | ORAL | 0 refills | Status: DC | PRN
Start: 2020-08-07 — End: 2020-08-19

## 2020-08-07 NOTE — Discharge Instructions (Addendum)
Your labs, exam and CT imaging are reassuring for any abnormalities.  You may try the meclizine prescribed to see if this helps your symptoms.

## 2020-08-07 NOTE — ED Triage Notes (Signed)
Pt presents to ED via [facility] for feeling like she is going to pass out and pressure in her head. Pt states has been going on for about 1 week.

## 2020-08-07 NOTE — ED Provider Notes (Signed)
[facility] EMERGENCY DEPARTMENT Provider Note   CSN: [id number] Arrival date & time: 08/07/20  1504     History Chief Complaint  Patient presents with  . Near Syncope    Renee Thomas is a 49 y.o. female with a history of anemia, GERD, mitral valve prolapse and history of PVCs presenting with a 2-week history of intermittent episodes of feeling like she is going to "pass out".  She describes episodes of lightheadedness and a vague dizziness but denies room spinning sensation.  She has had chronic tinnitus and reports pressure across her forehead and also in her left ear.  She has had similar episodes of symptoms like this going back several years.  She has been seen by her PCP for this and has been referred to Dr. Suszanne Conners to rule out possible Meniere's disease.  She denies fevers or chills, n/v, neck pain or stiffness. She does endorse clear nasal discharge, no significant congestion, also denies sinus pain, dental or jaw pain.  No perceived increase in her PVC's, denies palpitations during these episodes which can occur randomly, not triggered by positional changes.  Describes today was washing dished, so standing for a while when her last episode occurred. She has found no alleviators, currently sx are mild.  The history is provided by the patient.       Past Medical History:  Diagnosis Date  . Anemia   . GERD (gastroesophageal reflux disease)   . MVP (mitral valve prolapse)   . PVC's (premature ventricular contractions)     Patient Active Problem List   Diagnosis Date Noted  . GERD without esophagitis 05/02/2019  . BMI 45.0-49.9, adult (HCC) 05/02/2019  . Low serum vitamin B12 11/17/2016    History reviewed. No pertinent surgical history.   OB History    Gravida  0   Para  0   Term  0   Preterm  0   AB  0   Living  0     SAB  0   TAB  0   Ectopic  0   Multiple  0   Live Births              Family History  Problem Relation Age of Onset  .  Hypertension Mother   . Diabetes Mother   . Other Mother        renal disease  . Hypertension Father   . Heart attack Father   . Hypertension Sister   . Diabetes Brother   . Diabetes Brother     Social History   Tobacco Use  . Smoking status: Never Smoker  . Smokeless tobacco: Never Used  Vaping Use  . Vaping Use: Never used  Substance Use Topics  . Alcohol use: No    Alcohol/week: 0.0 standard drinks  . Drug use: No    Home Medications Prior to Admission medications   Medication Sig Start Date End Date Taking? Authorizing Provider  Carboxymethylcellul-Glycerin (CLEAR EYES FOR DRY EYES OP) Place 1-2 drops into both eyes daily as needed.    [provider]  Cholecalciferol (VITAMIN D PO) Take by mouth daily.    [provider]  cyanocobalamin (,VITAMIN B-12,) 1000 MCG/ML injection Inject 1,000 mcg into the muscle every 30 (thirty) days.    [provider]  fexofenadine (ALLEGRA ALLERGY) 180 MG tablet Take 1 tablet (180 mg total) by mouth daily. 07/30/20   Jannifer Rodney A, FNP  fluticasone (FLONASE) 50 MCG/ACT nasal spray SPRAY 2 SPRAYS  INTO EACH NOSTRIL EVERY DAY 01/05/20   Daphine Deutscher, [name], FNP  KRILL OIL PO Take 350 mg by mouth daily.     [provider]  omeprazole (PRILOSEC) 20 MG capsule TAKE 1 CAPSULE BY MOUTH EVERY DAY 01/19/20   Daphine Deutscher, [name], FNP  Probiotic Product (TRUBIOTICS PO) Take by mouth daily.    [provider]    Allergies    Other, Peanut-containing drug products, Eggs or egg-derived products, Fluarix [flu virus vaccine], Amoxicillin, Penicillins, and Sulfa antibiotics  Review of Systems   Review of Systems  Constitutional: Negative for chills and fever.  HENT: Positive for ear pain, rhinorrhea and tinnitus. Negative for congestion and sore throat.   Eyes: Negative.   Respiratory: Negative for chest tightness and shortness of breath.   Cardiovascular: Negative for chest pain.  Gastrointestinal:  Negative for abdominal pain and nausea.  Genitourinary: Negative.   Musculoskeletal: Negative for arthralgias, joint swelling and neck pain.  Skin: Negative.  Negative for rash and wound.  Neurological: Positive for light-headedness. Negative for dizziness, weakness, numbness and headaches.  Psychiatric/Behavioral: Negative.     Physical Exam Updated Vital Signs BP (!) 176/76 (BP Location: Right Wrist)   Pulse 61   Temp 99 F (37.2 C) (Oral)   Resp 16   Ht 5\' 5"  (1.651 m)   Wt 123.4 kg   SpO2 100%   BMI 45.26 kg/m   Physical Exam Vitals and nursing note reviewed.  Constitutional:      General: She is not in acute distress.    Appearance: She is well-developed.  HENT:     Head: Normocephalic and atraumatic.     Right Ear: Ear canal normal.     Left Ear: Ear canal normal. A middle ear effusion is present.     Ears:     Comments: Ear effusion left, slight loss of landmarks right.  No mastoid tenderness. Eyes:     General: No visual field deficit.    Extraocular Movements: Extraocular movements intact.     Conjunctiva/sclera: Conjunctivae normal.     Pupils: Pupils are equal, round, and reactive to light.  Cardiovascular:     Rate and Rhythm: Normal rate and regular rhythm.     Heart sounds: Normal heart sounds.  Pulmonary:     Effort: Pulmonary effort is normal.     Breath sounds: Normal breath sounds. No wheezing.  Abdominal:     General: Bowel sounds are normal.     Palpations: Abdomen is soft.     Tenderness: There is no abdominal tenderness.  Musculoskeletal:        General: Normal range of motion.     Cervical back: Normal range of motion. No rigidity.  Lymphadenopathy:     Cervical: No cervical adenopathy.  Skin:    General: Skin is warm and dry.  Neurological:     General: No focal deficit present.     Mental Status: She is alert.     Cranial Nerves: Cranial nerves are intact. No cranial nerve deficit.     Sensory: Sensation is intact.     Motor: Motor  function is intact. No pronator drift.     Coordination: Heel to Shin Test normal. Rapid alternating movements normal.     Comments: Equal grip strength     ED Results / Procedures / Treatments   Labs (all labs ordered are listed, but only abnormal results are displayed) Labs Reviewed  BASIC METABOLIC PANEL  CBC  CBG MONITORING, ED  EKG EKG Interpretation  Date/Time:  Saturday August 07 2020 15:12:21 EDT Ventricular Rate:  74 PR Interval:  132 QRS Duration: 82 QT Interval:  382 QTC Calculation: 424 R Axis:   -15 Text Interpretation: Sinus rhythm with occasional Premature ventricular complexes Left ventricular hypertrophy with repolarization abnormality ( R in aVL , Cornell product , Romhilt-Estes ) Abnormal ECG Since last tracing LVH now present Confirmed by Eber Hong ([id number]) on 08/07/2020 5:49:29 PM   Radiology CT Head Wo Contrast  Result Date: 08/07/2020 CLINICAL DATA:  Worsening headache EXAM: CT HEAD WITHOUT CONTRAST TECHNIQUE: Contiguous axial images were obtained from the base of the skull through the vertex without intravenous contrast. COMPARISON:  None. FINDINGS: Brain: No evidence of acute territorial infarction, hemorrhage, hydrocephalus,extra-axial collection or mass lesion/mass effect. Normal gray-white differentiation. Ventricles are normal in size and contour. Vascular: No hyperdense vessel or unexpected calcification. Skull: The skull is intact. No fracture or focal lesion identified. Sinuses/Orbits: The visualized paranasal sinuses and mastoid air cells are clear. The orbits and globes intact. Other: None IMPRESSION: No acute intracranial abnormality. Electronically Signed   By: Jonna Clark M.D.   On: 08/07/2020 22:02    Procedures Procedures (including critical care time)  Medications Ordered in ED Medications  meclizine (ANTIVERT) tablet 25 mg (25 mg Oral Given 08/07/20 2043)    ED Course  I have reviewed the triage vital signs and the nursing  notes.  Pertinent labs & imaging results that were available during my care of the patient were reviewed by me and considered in my medical decision making (see chart for details).    MDM Rules/Calculators/A&P                          Labs imaging and exam reassuring with no neuro deficit.  She was given a meclizine with equivocal improvement in sx. Suspect eustachian tube dysfunction vs possible Meniere's. She was given information about tx for Meniere's by her pcp but has not instituted any changes yet - encouraged regarding dietary changes, salt reduction.  Asked pt to try the meclizine for a few more days before decided effectiveness. Plan f/u pcp and with Dr. Suszanne Conners as scheduled.  No intracranial mass/trauma, sinus's appear clear. Final Clinical Impression(s) / ED Diagnoses Final diagnoses:  None    Rx / DC Orders ED Discharge Orders    None       Victoriano Lain 08/08/20 1713    Eber Hong, MD 08/10/20 1459

## 2020-08-07 NOTE — ED Notes (Signed)
   08/07/20 2204  Orthostatic Lying   BP- Lying 146/71  Pulse- Lying 62  Orthostatic Sitting  BP- Sitting 141/68  Pulse- Sitting 66  Orthostatic Standing at 0 minutes  BP- Standing at 0 minutes 122/63  Pulse- Standing at 0 minutes 78

## 2020-08-07 NOTE — ED Notes (Addendum)
Pt up and ambulatory to restroom at this time with a stand by assist. This RN notes and even and steady gait with ambulation.

## 2020-08-07 NOTE — ED Notes (Signed)
Pt to CT at this time.

## 2020-08-07 NOTE — ED Notes (Addendum)
Entered room and introduced self to patient and family at bedside. Pt appears to be resting comfortably in bed with no signs of acute distress. Bed is locked in the lowest position, side rails x2, call bell within reach and family at bedside.  Pt and family educated on hourly rounding and call light use.  PA at bedside for assessment at this time.

## 2020-08-11 NOTE — Telephone Encounter (Signed)
Pt aware ppw faxed to company

## 2020-08-19 ENCOUNTER — Encounter: Payer: Self-pay | Admitting: Family

## 2020-08-19 ENCOUNTER — Ambulatory Visit (INDEPENDENT_AMBULATORY_CARE_PROVIDER_SITE_OTHER): Payer: BC Managed Care – PPO | Admitting: Family

## 2020-08-19 DIAGNOSIS — F411 Generalized anxiety disorder: Secondary | ICD-10-CM | POA: Diagnosis not present

## 2020-08-19 DIAGNOSIS — R42 Dizziness and giddiness: Secondary | ICD-10-CM

## 2020-08-19 DIAGNOSIS — R2689 Other abnormalities of gait and mobility: Secondary | ICD-10-CM | POA: Diagnosis not present

## 2020-08-19 DIAGNOSIS — Z09 Encounter for follow-up examination after completed treatment for conditions other than malignant neoplasm: Secondary | ICD-10-CM | POA: Diagnosis not present

## 2020-08-19 MED ORDER — MECLIZINE HCL 25 MG PO TABS: 25.0000 mg | ORAL_TABLET | Freq: Three times a day (TID) | ORAL | 2 refills | Status: DC | PRN

## 2020-08-19 MED ORDER — BUSPIRONE HCL 5 MG PO TABS: 5.0000 mg | ORAL_TABLET | Freq: Three times a day (TID) | ORAL | 1 refills | Status: DC | PRN

## 2020-08-19 NOTE — Progress Notes (Signed)
Virtual Visit via telephone Note Due to COVID-19 pandemic this visit was conducted virtually. This visit type was conducted due to national recommendations for restrictions regarding the COVID-19 Pandemic (e.g. social distancing, sheltering in place) in an effort to limit this patient's exposure and mitigate transmission in our community. All issues noted in this document were discussed and addressed.  A physical exam was not performed with this format.  I connected with Renee Thomas on 08/19/20 at 1:56 pm  by telephone and verified that I am speaking with the correct person using two identifiers. Renee Thomas is currently located at home and no one is currently with her during visit. The provider, Jannifer Rodney, FNP is located in their office at time of visit.  I discussed the limitations, risks, security and privacy concerns of performing an evaluation and management service by telephone and the availability of in person appointments. I also discussed with the patient that there may be a patient responsible charge related to this service. The patient expressed understanding and agreed to proceed.   History and Present Illness:  Pt calls the office today for recurrent dizziness. She went to the ED on 08/07/20 and was given antivert. She had a normal CBC and CMP.  She states this has helped slightly. She has an appt with ENT 09/08/20. She also reports increased GAD.  Dizziness This is a new problem. The current episode started more than 1 month ago. The problem occurs intermittently. The problem has been unchanged. Pertinent negatives include no chills, congestion, coughing, fatigue, fever, headaches, nausea, swollen glands, urinary symptoms, vertigo or visual change. The symptoms are aggravated by twisting. Treatments tried: antivert  The treatment provided moderate relief.  Anxiety Presents for initial visit. Symptoms include dizziness, excessive worry, irritability, nervous/anxious behavior,  palpitations and restlessness. Patient reports no nausea. Symptoms occur occasionally.        Review of Systems  Constitutional: Positive for irritability. Negative for chills, fatigue and fever.  HENT: Negative for congestion.   Respiratory: Negative for cough.   Cardiovascular: Positive for palpitations.  Gastrointestinal: Negative for nausea.  Neurological: Positive for dizziness. Negative for vertigo and headaches.  Psychiatric/Behavioral: The patient is nervous/anxious.      Observations/Objective: No SOB or distress noted  Assessment and Plan: Renee Thomas comes in today with chief complaint of No chief complaint on file.   Diagnosis and orders addressed: 1. Hospital discharge follow-up  2. Dizziness - meclizine (ANTIVERT) 25 MG tablet; Take 1 tablet (25 mg total) by mouth 3 (three) times daily as needed for dizziness.  Dispense: 60 tablet; Refill: 2  3. Loss of balance  4. GAD (generalized anxiety disorder) - busPIRone (BUSPAR) 5 MG tablet; Take 1 tablet (5 mg total) by mouth 3 (three) times daily as needed.  Dispense: 90 tablet; Refill: 1  Hospital notes reviewed  Will refill antivert and keep referral to ENT Will start Buspar 5 mg TID prn Stress management  First dose take at night    I discussed the assessment and treatment plan with the patient. The patient was provided an opportunity to ask questions and all were answered. The patient agreed with the plan and demonstrated an understanding of the instructions.   The patient was advised to call back or seek an in-person evaluation if the symptoms worsen or if the condition fails to improve as anticipated.  The above assessment and management plan was discussed with the patient. The patient verbalized understanding of and has agreed to the  management plan. Patient is aware to call the clinic if symptoms persist or worsen. Patient is aware when to return to the clinic for a follow-up visit. Patient educated on  when it is appropriate to go to the emergency department.   Time call ended:  2:08 pm   I provided 12 minutes of non-face-to-face time during this encounter.    Jannifer Rodney, FNP

## 2020-08-23 ENCOUNTER — Other Ambulatory Visit: Payer: Self-pay

## 2020-08-23 ENCOUNTER — Ambulatory Visit (INDEPENDENT_AMBULATORY_CARE_PROVIDER_SITE_OTHER): Payer: BC Managed Care – PPO | Admitting: *Deleted

## 2020-08-23 DIAGNOSIS — E538 Deficiency of other specified B group vitamins: Secondary | ICD-10-CM | POA: Diagnosis not present

## 2020-08-23 NOTE — Progress Notes (Signed)
Patient in today for monthly B12 injection. 1000 mcg given in left deltoid. Patient tolerated well.  °

## 2020-09-01 ENCOUNTER — Other Ambulatory Visit: Payer: Self-pay

## 2020-09-01 ENCOUNTER — Ambulatory Visit (INDEPENDENT_AMBULATORY_CARE_PROVIDER_SITE_OTHER): Payer: BC Managed Care – PPO

## 2020-09-01 DIAGNOSIS — Z23 Encounter for immunization: Secondary | ICD-10-CM

## 2020-09-08 DIAGNOSIS — H903 Sensorineural hearing loss, bilateral: Secondary | ICD-10-CM | POA: Diagnosis not present

## 2020-09-08 DIAGNOSIS — H9313 Tinnitus, bilateral: Secondary | ICD-10-CM | POA: Diagnosis not present

## 2020-09-08 DIAGNOSIS — R42 Dizziness and giddiness: Secondary | ICD-10-CM | POA: Diagnosis not present

## 2020-09-10 ENCOUNTER — Ambulatory Visit: Payer: BC Managed Care – PPO | Admitting: Nurse Practitioner

## 2020-09-10 ENCOUNTER — Other Ambulatory Visit: Payer: Self-pay | Admitting: Family

## 2020-09-10 ENCOUNTER — Other Ambulatory Visit: Payer: Self-pay

## 2020-09-10 ENCOUNTER — Encounter: Payer: Self-pay | Admitting: Nurse Practitioner

## 2020-09-10 VITALS — BP 155/77 | HR 67 | Temp 97.3°F | Resp 20 | Ht 65.0 in | Wt 277.5 lb

## 2020-09-10 DIAGNOSIS — R6889 Other general symptoms and signs: Secondary | ICD-10-CM | POA: Diagnosis not present

## 2020-09-10 DIAGNOSIS — Z6841 Body Mass Index (BMI) 40.0 and over, adult: Secondary | ICD-10-CM | POA: Diagnosis not present

## 2020-09-10 DIAGNOSIS — K219 Gastro-esophageal reflux disease without esophagitis: Secondary | ICD-10-CM | POA: Diagnosis not present

## 2020-09-10 DIAGNOSIS — R42 Dizziness and giddiness: Secondary | ICD-10-CM

## 2020-09-10 DIAGNOSIS — F411 Generalized anxiety disorder: Secondary | ICD-10-CM

## 2020-09-10 DIAGNOSIS — E538 Deficiency of other specified B group vitamins: Secondary | ICD-10-CM

## 2020-09-10 DIAGNOSIS — I10 Essential (primary) hypertension: Secondary | ICD-10-CM | POA: Insufficient documentation

## 2020-09-10 MED ORDER — HYDROCHLOROTHIAZIDE 25 MG PO TABS: 25.0000 mg | ORAL_TABLET | Freq: Every day | ORAL | 3 refills | Status: DC

## 2020-09-10 NOTE — Patient Instructions (Signed)
DASH Eating Plan DASH stands for "Dietary Approaches to Stop Hypertension." The DASH eating plan is a healthy eating plan that has been shown to reduce high blood pressure (hypertension). It may also reduce your risk for type 2 diabetes, heart disease, and stroke. The DASH eating plan may also help with weight loss. What are tips for following this plan?  General guidelines  Avoid eating more than 2,300 mg (milligrams) of salt (sodium) a day. If you have hypertension, you may need to reduce your sodium intake to 1,500 mg a day.  Limit alcohol intake to no more than 1 drink a day for nonpregnant women and 2 drinks a day for men. One drink equals 12 oz of beer, 5 oz of wine, or 1 oz of hard liquor.  Work with your health care provider to maintain a healthy body weight or to lose weight. Ask what an ideal weight is for you.  Get at least 30 minutes of exercise that causes your heart to beat faster (aerobic exercise) most days of the week. Activities may include walking, swimming, or biking.  Work with your health care provider or diet and nutrition specialist (dietitian) to adjust your eating plan to your individual calorie needs. Reading food labels   Check food labels for the amount of sodium per serving. Choose foods with less than 5 percent of the Daily Value of sodium. Generally, foods with less than 300 mg of sodium per serving fit into this eating plan.  To find whole grains, look for the word "whole" as the first word in the ingredient list. Shopping  Buy products labeled as "low-sodium" or "no salt added."  Buy fresh foods. Avoid canned foods and premade or frozen meals. Cooking  Avoid adding salt when cooking. Use salt-free seasonings or herbs instead of table salt or sea salt. Check with your health care provider or pharmacist before using salt substitutes.  Do not fry foods. Cook foods using healthy methods such as baking, boiling, grilling, and broiling instead.  Cook with  heart-healthy oils, such as olive, canola, soybean, or sunflower oil. Meal planning  Eat a balanced diet that includes: ? 5 or more servings of fruits and vegetables each day. At each meal, try to fill half of your plate with fruits and vegetables. ? Up to 6-8 servings of whole grains each day. ? Less than 6 oz of lean meat, poultry, or fish each day. A 3-oz serving of meat is about the same size as a deck of cards. One egg equals 1 oz. ? 2 servings of low-fat dairy each day. ? A serving of nuts, seeds, or beans 5 times each week. ? Heart-healthy fats. Healthy fats called Omega-3 fatty acids are found in foods such as flaxseeds and coldwater fish, like sardines, salmon, and mackerel.  Limit how much you eat of the following: ? Canned or prepackaged foods. ? Food that is high in trans fat, such as fried foods. ? Food that is high in saturated fat, such as fatty meat. ? Sweets, desserts, sugary drinks, and other foods with added sugar. ? Full-fat dairy products.  Do not salt foods before eating.  Try to eat at least 2 vegetarian meals each week.  Eat more home-cooked food and less restaurant, buffet, and fast food.  When eating at a restaurant, ask that your food be prepared with less salt or no salt, if possible. What foods are recommended? The items listed may not be a complete list. Talk with your dietitian about   what dietary choices are best for you. Grains Whole-grain or whole-wheat bread. Whole-grain or whole-wheat pasta. Brown rice. Oatmeal. Quinoa. Bulgur. Whole-grain and low-sodium cereals. Pita bread. Low-fat, low-sodium crackers. Whole-wheat flour tortillas. Vegetables Fresh or frozen vegetables (raw, steamed, roasted, or grilled). Low-sodium or reduced-sodium tomato and vegetable juice. Low-sodium or reduced-sodium tomato sauce and tomato paste. Low-sodium or reduced-sodium canned vegetables. Fruits All fresh, dried, or frozen fruit. Canned fruit in natural juice (without  added sugar). Meat and other protein foods Skinless chicken or turkey. Ground chicken or turkey. Pork with fat trimmed off. Fish and seafood. Egg whites. Dried beans, peas, or lentils. Unsalted nuts, nut butters, and seeds. Unsalted canned beans. Lean cuts of beef with fat trimmed off. Low-sodium, lean deli meat. Dairy Low-fat (1%) or fat-free (skim) milk. Fat-free, low-fat, or reduced-fat cheeses. Nonfat, low-sodium ricotta or cottage cheese. Low-fat or nonfat yogurt. Low-fat, low-sodium cheese. Fats and oils Soft margarine without trans fats. Vegetable oil. Low-fat, reduced-fat, or light mayonnaise and salad dressings (reduced-sodium). Canola, safflower, olive, soybean, and sunflower oils. Avocado. Seasoning and other foods Herbs. Spices. Seasoning mixes without salt. Unsalted popcorn and pretzels. Fat-free sweets. What foods are not recommended? The items listed may not be a complete list. Talk with your dietitian about what dietary choices are best for you. Grains Baked goods made with fat, such as croissants, muffins, or some breads. Dry pasta or rice meal packs. Vegetables Creamed or fried vegetables. Vegetables in a cheese sauce. Regular canned vegetables (not low-sodium or reduced-sodium). Regular canned tomato sauce and paste (not low-sodium or reduced-sodium). Regular tomato and vegetable juice (not low-sodium or reduced-sodium). Pickles. Olives. Fruits Canned fruit in a light or heavy syrup. Fried fruit. Fruit in cream or butter sauce. Meat and other protein foods Fatty cuts of meat. Ribs. Fried meat. Bacon. Sausage. Bologna and other processed lunch meats. Salami. Fatback. Hotdogs. Bratwurst. Salted nuts and seeds. Canned beans with added salt. Canned or smoked fish. Whole eggs or egg yolks. Chicken or turkey with skin. Dairy Whole or 2% milk, cream, and half-and-half. Whole or full-fat cream cheese. Whole-fat or sweetened yogurt. Full-fat cheese. Nondairy creamers. Whipped toppings.  Processed cheese and cheese spreads. Fats and oils Butter. Stick margarine. Lard. Shortening. Ghee. Bacon fat. Tropical oils, such as coconut, palm kernel, or palm oil. Seasoning and other foods Salted popcorn and pretzels. Onion salt, garlic salt, seasoned salt, table salt, and sea salt. Worcestershire sauce. Tartar sauce. Barbecue sauce. Teriyaki sauce. Soy sauce, including reduced-sodium. Steak sauce. Canned and packaged gravies. Fish sauce. Oyster sauce. Cocktail sauce. Horseradish that you find on the shelf. Ketchup. Mustard. Meat flavorings and tenderizers. Bouillon cubes. Hot sauce and Tabasco sauce. Premade or packaged marinades. Premade or packaged taco seasonings. Relishes. Regular salad dressings. Where to find more information:  National Heart, Lung, and Blood Institute: www.nhlbi.nih.gov  American Heart Association: www.heart.org Summary  The DASH eating plan is a healthy eating plan that has been shown to reduce high blood pressure (hypertension). It may also reduce your risk for type 2 diabetes, heart disease, and stroke.  With the DASH eating plan, you should limit salt (sodium) intake to 2,300 mg a day. If you have hypertension, you may need to reduce your sodium intake to 1,500 mg a day.  When on the DASH eating plan, aim to eat more fresh fruits and vegetables, whole grains, lean proteins, low-fat dairy, and heart-healthy fats.  Work with your health care provider or diet and nutrition specialist (dietitian) to adjust your eating plan to your   individual calorie needs. This information is not intended to replace advice given to you by your health care provider. Make sure you discuss any questions you have with your health care provider. Document Revised: 10/05/2017 Document Reviewed: 10/16/2016 Elsevier Patient Education  2020 [employer]  

## 2020-09-10 NOTE — Progress Notes (Signed)
 Subjective:    Patient ID: Renee Thomas, female    DOB: 12/03/1970, 49 y.o.   MRN: [id number]   Chief Complaint: Medical Management of Chronic Issues    HPI:  1. GERD without esophagitis IS ON OMEPRAZOLE DAILY AND THAT WORKS WELL FOR HER.  2. Low serum vitamin B12 She is on b12 injections that she gets monthly at office. No c/o fatigue.  3. BMI 45.0-49.9, adult (HCC) Weight is up 5lbs since last visit Wt Readings from Last 3 Encounters:  09/10/20 277 lb 8 oz (125.9 kg)  08/07/20 272 lb (123.4 kg)  07/30/20 272 lb 12.8 oz (123.7 kg)   BMI Readings from Last 3 Encounters:  09/10/20 46.18 kg/m  08/07/20 45.26 kg/m  07/30/20 45.40 kg/m         History reviewed. No pertinent surgical history.  Family History  Problem Relation Age of Onset  . Hypertension Mother   . Diabetes Mother   . Other Mother        renal disease  . Hypertension Father   . Heart attack Father   . Hypertension Sister   . Diabetes Brother   . Diabetes Brother     New complaints: Has been seeing ENT for dizziness and is on meclizine. She is having test on inner ear next week. The dizziness comes and goes.  Social history: Lives with her sister  Controlled substance contract: n/a    Review of Systems  Constitutional: Negative for diaphoresis.  Eyes: Negative for pain.  Respiratory: Negative for shortness of breath.   Cardiovascular: Negative for chest pain, palpitations and leg swelling.  Gastrointestinal: Negative for abdominal pain.  Endocrine: Negative for polydipsia.  Skin: Negative for rash.  Neurological: Positive for dizziness. Negative for weakness and headaches.  Hematological: Does not bruise/bleed easily.  All other systems reviewed and are negative.      Objective:   Physical Exam Vitals and nursing note reviewed.  Constitutional:      General: She is not in acute distress.    Appearance: Normal appearance. She is well-developed.  HENT:     Head:  Normocephalic.     Nose: Nose normal.  Eyes:     Pupils: Pupils are equal, round, and reactive to light.  Neck:     Vascular: No carotid bruit or JVD.  Cardiovascular:     Rate and Rhythm: Normal rate and regular rhythm.     Heart sounds: Normal heart sounds.  Pulmonary:     Effort: Pulmonary effort is normal. No respiratory distress.     Breath sounds: Normal breath sounds. No wheezing or rales.  Chest:     Chest wall: No tenderness.  Abdominal:     General: Bowel sounds are normal. There is no distension or abdominal bruit.     Palpations: Abdomen is soft. There is no hepatomegaly, splenomegaly, mass or pulsatile mass.     Tenderness: There is no abdominal tenderness.  Musculoskeletal:        General: Normal range of motion.     Cervical back: Normal range of motion and neck supple.  Lymphadenopathy:     Cervical: No cervical adenopathy.  Skin:    General: Skin is warm and dry.  Neurological:     Mental Status: She is alert and oriented to person, place, and time.     Deep Tendon Reflexes: Reflexes are normal and symmetric.  Psychiatric:        Behavior: Behavior normal.          Thought Content: Thought content normal.        Judgment: Judgment normal.     BP (!) 155/77   Pulse 67   Temp (!) 97.3 F (36.3 C) (Temporal)   Resp 20   Ht 5' 5" (1.651 m)   Wt 277 lb 8 oz (125.9 kg)   SpO2 100%   BMI 46.18 kg/m        Assessment & Plan:  Renee Thomas comes in today with chief complaint of Medical Management of Chronic Issues   Diagnosis and orders addressed:  1. GERD without esophagitis Avoid spicy foods Do not eat 2 hours prior to bedtime  2. Low serum vitamin B12 Continue monthly b12 - Vitamin B12  3. BMI 45.0-49.9, adult ([facility]) Discussed diet and exercise for person with BMI >25 Will recheck weight in 3-6 months   4. Primary hypertension Low sodium diet Started on HCTZ Keep diary of blood pressure - hydrochlorothiazide (HYDRODIURIL) 25 MG tablet;  Take 1 tablet (25 mg total) by mouth daily.  Dispense: 90 tablet; Refill: 3 - CMP14+EGFR - CBC with Differential/Platelet - Lipid panel  5. Dizziness Keep follow up with ENT - Thyroid Panel With TSH   Labs pending Health Maintenance reviewed Diet and exercise encouraged  Follow up plan: 6 months   [place], FNP

## 2020-09-11 LAB — CMP14+EGFR
ALT: 14 IU/L (ref 0–32)
AST: 11 IU/L (ref 0–40)
Albumin/Globulin Ratio: 1.4 (ref 1.2–2.2)
Albumin: 4.6 g/dL (ref 3.8–4.9)
Alkaline Phosphatase: 98 IU/L (ref 44–121)
BUN/Creatinine Ratio: 16 (ref 9–23)
BUN: 12 mg/dL (ref 6–24)
Bilirubin Total: 0.5 mg/dL (ref 0.0–1.2)
CO2: 28 mmol/L (ref 20–29)
Calcium: 10.1 mg/dL (ref 8.7–10.2)
Chloride: 102 mmol/L (ref 96–106)
Creatinine, Ser: 0.77 mg/dL (ref 0.57–1.00)
GFR calc Af Amer: 103 mL/min/{1.73_m2} (ref >59)
GFR calc non Af Amer: 90 mL/min/{1.73_m2} (ref >59)
Globulin, Total: 3.2 g/dL (ref 1.5–4.5)
Glucose: 84 mg/dL (ref 65–99)
Potassium: 4.4 mmol/L (ref 3.5–5.2)
Sodium: 140 mmol/L (ref 134–144)
Total Protein: 7.8 g/dL (ref 6.0–8.5)

## 2020-09-11 LAB — CBC WITH DIFFERENTIAL/PLATELET
Basophils Absolute: 0 10*3/uL (ref 0.0–0.2)
Basos: 1 %
EOS (ABSOLUTE): 0.1 10*3/uL (ref 0.0–0.4)
Eos: 2 %
Hematocrit: 37.9 % (ref 34.0–46.6)
Hemoglobin: 12.3 g/dL (ref 11.1–15.9)
Immature Grans (Abs): 0 10*3/uL (ref 0.0–0.1)
Immature Granulocytes: 0 %
Lymphocytes Absolute: 2.9 10*3/uL (ref 0.7–3.1)
Lymphs: 48 %
MCH: 29.3 pg (ref 26.6–33.0)
MCHC: 32.5 g/dL (ref 31.5–35.7)
MCV: 90 fL (ref 79–97)
Monocytes Absolute: 0.5 10*3/uL (ref 0.1–0.9)
Monocytes: 9 %
Neutrophils Absolute: 2.4 10*3/uL (ref 1.4–7.0)
Neutrophils: 40 %
Platelets: 315 10*3/uL (ref 150–450)
RBC: 4.2 x10E6/uL (ref 3.77–5.28)
RDW: 13.1 % (ref 11.7–15.4)
WBC: 5.9 10*3/uL (ref 3.4–10.8)

## 2020-09-11 LAB — LIPID PANEL
Chol/HDL Ratio: 3.1 ratio (ref 0.0–4.4)
Cholesterol, Total: 204 mg/dL — ABNORMAL HIGH (ref 100–199)
HDL: 66 mg/dL (ref >39)
LDL Chol Calc (NIH): 124 mg/dL — ABNORMAL HIGH (ref 0–99)
Triglycerides: 80 mg/dL (ref 0–149)
VLDL Cholesterol Cal: 14 mg/dL (ref 5–40)

## 2020-09-11 LAB — THYROID PANEL WITH TSH
Free Thyroxine Index: 2.1 (ref 1.2–4.9)
T3 Uptake Ratio: 25 % (ref 24–39)
T4, Total: 8.3 ug/dL (ref 4.5–12.0)
TSH: 2.89 u[IU]/mL (ref 0.450–4.500)

## 2020-09-11 LAB — VITAMIN B12: Vitamin B-12: 1189 pg/mL (ref 232–1245)

## 2020-09-16 DIAGNOSIS — R42 Dizziness and giddiness: Secondary | ICD-10-CM | POA: Diagnosis not present

## 2020-09-22 ENCOUNTER — Telehealth: Payer: Self-pay

## 2020-09-22 ENCOUNTER — Telehealth: Payer: Self-pay | Admitting: Family Medicine

## 2020-09-22 DIAGNOSIS — R0982 Postnasal drip: Secondary | ICD-10-CM | POA: Diagnosis not present

## 2020-09-22 DIAGNOSIS — R42 Dizziness and giddiness: Secondary | ICD-10-CM | POA: Diagnosis not present

## 2020-09-22 DIAGNOSIS — H903 Sensorineural hearing loss, bilateral: Secondary | ICD-10-CM | POA: Diagnosis not present

## 2020-09-22 NOTE — Telephone Encounter (Signed)
New Message    Patient went to ENT and she has to go to therapy for a problem with her ear, but the ENT suggested that she call you and have you put order in for a ECHO to make sure there is no problem with the blood flow in her heart

## 2020-09-23 ENCOUNTER — Other Ambulatory Visit: Payer: Self-pay

## 2020-09-23 ENCOUNTER — Encounter: Payer: Self-pay | Admitting: *Deleted

## 2020-09-23 ENCOUNTER — Ambulatory Visit (INDEPENDENT_AMBULATORY_CARE_PROVIDER_SITE_OTHER): Payer: BC Managed Care – PPO | Admitting: *Deleted

## 2020-09-23 DIAGNOSIS — E538 Deficiency of other specified B group vitamins: Secondary | ICD-10-CM | POA: Diagnosis not present

## 2020-09-23 NOTE — Telephone Encounter (Signed)
Pt c/o dizziness since late September and thought it was related to inner ear - went to ENT yesterday who suggested that she should have an echo done to ensure pumping function was normal - also recently went to pcp and was started on hctz 25 daily for elevated BP and some leg swelling - pt denies SOB/weight gain - does have some left sided chest aches occasionally but not bothersome and is mostly concerned with increasing dizziness - will forward to provider

## 2020-09-23 NOTE — Telephone Encounter (Signed)
Spoke with patient yesterday and she is wanting to do short tern disability through her work. Discussed with patient and advised to drop off paperwork and we will fill out.

## 2020-09-23 NOTE — Telephone Encounter (Signed)
Can you please call patient and see what she needs

## 2020-09-23 NOTE — Telephone Encounter (Signed)
Records requested

## 2020-09-23 NOTE — Telephone Encounter (Signed)
Typically would not get an echo for just dizzienss alone, seems may be there could be something else that was noted during there visit. Can we get the note from the ENT   Dominga Ferry MD

## 2020-09-23 NOTE — Progress Notes (Signed)
Patient in today for monthly B12 injection. 1000 mcg given in right deltoid. Patient tolerated well.  °

## 2020-09-24 NOTE — Telephone Encounter (Signed)
Notes from Dr. Avel Sensor office received and faxed to [place] for review by provider.

## 2020-09-24 NOTE — Telephone Encounter (Signed)
Dr Verna Czech 1/2 day will give to provider on Monday for review

## 2020-09-27 ENCOUNTER — Ambulatory Visit: Payer: BC Managed Care – PPO | Admitting: Certified Nurse Midwife

## 2020-09-27 NOTE — Telephone Encounter (Signed)
Provider out of office this week - records emailed

## 2020-10-04 ENCOUNTER — Telehealth: Payer: Self-pay | Admitting: Family Medicine

## 2020-10-04 NOTE — Telephone Encounter (Signed)
Left detailed message on pt VM that per Dr Wyline Mood records were reviewed and no indication for echo

## 2020-10-04 NOTE — Telephone Encounter (Signed)
Patient called in regards to having an echo.

## 2020-10-04 NOTE — Telephone Encounter (Signed)
I don't have these records here,   JBranch MD

## 2020-10-04 NOTE — Telephone Encounter (Signed)
Already open phone note for this pt regarding echo

## 2020-10-04 NOTE — Telephone Encounter (Signed)
[name], Renee Thomas Patient called in regards to having an echo.

## 2020-10-10 DIAGNOSIS — I10 Essential (primary) hypertension: Secondary | ICD-10-CM | POA: Diagnosis not present

## 2020-10-10 DIAGNOSIS — R0789 Other chest pain: Secondary | ICD-10-CM | POA: Diagnosis not present

## 2020-10-10 DIAGNOSIS — R079 Chest pain, unspecified: Secondary | ICD-10-CM | POA: Diagnosis not present

## 2020-10-11 ENCOUNTER — Telehealth: Payer: Self-pay | Admitting: Family Medicine

## 2020-10-11 NOTE — Telephone Encounter (Signed)
Contacted patient and advised to keep visit with Nena Polio, NP on 10/14/20 and if symptoms get worse, to go to the ED for an evaluation or call 911. Verbalized understanding of plan.

## 2020-10-11 NOTE — Telephone Encounter (Signed)
New message    Patient had to call EMS 10/10/20 for rapid heart rate, the ems gave her Oxygen and did EKG , that said that she was having PVC   Set the patient for appointment for Thursday 10/14/20 with Mardelle Matte

## 2020-10-13 NOTE — Progress Notes (Addendum)
Cardiology Office Note  Date: 10/14/2020   ID: Renee Thomas, DOB 22-Jun-1971, MRN [id number]  PCP:  Bennie Pierini, FNP  Cardiologist:  Dina Rich, MD Electrophysiologist:  None   Chief Complaint: Follow-up PVCs  History of Present Illness: Renee Thomas is a 49 y.o. female with a history of PVCs, dizziness, GERD, anemia, mitral valve prolapse.  Last seen by Dr. Wyline Mood 08/27/2017: She had a recent ER visit with dizziness.  Overall work-up was negative orthostatics were negative and there were no recurrent episodes.  Having occasional palpitations mainly with lying down going to sleep.  Could occur with stress.  Over the prior month could have some positional back and chest pain.  24-hour Holter monitor was ordered to quantify PVC burden.  Pending Holter monitor results consider work-up for structural heart disease with possible echo and/or stress test.  Subsequent stress test and echocardiogram were ordered.  Last visit with me on 07/10/2020.  She complained of recent episodes of near syncope and called EMS.  She stated she had been having some issues with balance and sinus issues.  EMS noted some PVCs on her EKG when they checked it at her home.  She stated her blood pressure was low initially while sitting at 90/59 and subsequently after standing was 100/96.  She did not not remember what her heart rate was at that time.  She stated she was going to see her PCP for her balance issues.  She stated when this issue occurred the other day she turned her head and suddenly became dizzy which improved but it later recurred but she never had a frank syncopal episode.  Stated she had chronic issues with sinus infections and ear issues.  EKG showed sinus bradycardia with occasional premature ventricular complexes with a rate of 59, nonspecific T wave abnormalities.  She stated the ear issues and balance issues had been going on for several months.  Blood pressure was elevated at 158/98.  Stated  she did not check her blood pressure regularly at home.  She had no formal diagnosis of hypertension and not on any antihypertensive medications.  Recent phone call with clinical staff on 10/11/2020 with complaints of rapid heart rate, PVCs.  EMS had been called and she received oxygen and EKG.  EMS personnel stated she had PVCs on EKG.  Had been complaining of some recent lightheadedness/dizziness.  Had seen an ENT provider recently for dizziness/lightheadedness..  Past Medical History:  Diagnosis Date  . Anemia   . Balance disorder   . GERD (gastroesophageal reflux disease)   . MVP (mitral valve prolapse)   . PVC's (premature ventricular contractions)     History reviewed. No pertinent surgical history.  Current Outpatient Medications  Medication Sig Dispense Refill  . Carboxymethylcellul-Glycerin (CLEAR EYES FOR DRY EYES OP) Place 1-2 drops into both eyes daily as needed.    . Cholecalciferol (VITAMIN D PO) Take by mouth daily.    . cyanocobalamin (,VITAMIN B-12,) 1000 MCG/ML injection Inject 1,000 mcg into the muscle every 30 (thirty) days.    . fexofenadine (ALLEGRA ALLERGY) 180 MG tablet Take 1 tablet (180 mg total) by mouth daily. 90 tablet 1  . fluticasone (FLONASE) 50 MCG/ACT nasal spray SPRAY 2 SPRAYS INTO EACH NOSTRIL EVERY DAY 48 mL 1  . hydrochlorothiazide (HYDRODIURIL) 25 MG tablet Take 1 tablet (25 mg total) by mouth daily. 90 tablet 3  . KRILL OIL PO Take 350 mg by mouth daily.     Marland Kitchen omeprazole (PRILOSEC)  20 MG capsule TAKE 1 CAPSULE BY MOUTH EVERY DAY 90 capsule 0  . omeprazole (PRILOSEC) 20 MG capsule Take 1 tablet by mouth daily.    . busPIRone (BUSPAR) 5 MG tablet TAKE 1 TABLET BY MOUTH 3 TIMES DAILY AS NEEDED. 270 tablet 1  . meclizine (ANTIVERT) 25 MG tablet Take 1 tablet (25 mg total) by mouth 3 (three) times daily as needed for dizziness. (Patient not taking: Reported on 09/10/2020) 60 tablet 2  . Probiotic Product (TRUBIOTICS PO) Take by mouth daily.     No  current facility-administered medications for this visit.   Allergies:  Other, Peanut-containing drug products, Eggs or egg-derived products, Fluarix [flu virus vaccine], Amoxicillin, Penicillins, and Sulfa antibiotics   Social History: The patient  reports that she has never smoked. She has never used smokeless tobacco. She reports that she does not drink alcohol and does not use drugs.   Family History: The patient's family history includes Diabetes in her brother, brother, and mother; Heart attack in her father; Hypertension in her father, mother, and sister; Other in her mother.   ROS:  Please see the history of present illness. Otherwise, complete review of systems is positive for none.  All other systems are reviewed and negative.   Physical Exam: VS:  BP (!) 144/88   Pulse 66   Ht 5\' 5"  (1.651 m)   Wt 274 lb (124.3 kg)   SpO2 97%   BMI 45.60 kg/m , BMI Body mass index is 45.6 kg/m.  Wt Readings from Last 3 Encounters:  10/14/20 274 lb (124.3 kg)  09/10/20 277 lb 8 oz (125.9 kg)  08/07/20 272 lb (123.4 kg)    General: Morbidly obese patient appears comfortable at rest. Neck: Supple, no elevated JVP or carotid bruits, no thyromegaly. Lungs: Clear to auscultation, nonlabored breathing at rest. Cardiac: Regular rate and rhythm, no S3 or significant systolic murmur, no pericardial rub. Extremities: No pitting edema, distal pulses 2+. Skin: Warm and dry. Musculoskeletal: No kyphosis. Neuropsychiatric: Alert and oriented x3, affect grossly appropriate.  ECG:  An ECG dated October 14, 2020 was personally reviewed today and demonstrated:  Sinus rhythm with occasional premature ventricular complexes rate of 80, minimal voltage criteria for LVH may be normal variant.  Cannot rule out anterior infarct, age undetermined.  Recent Labwork: 09/10/2020: ALT 14; AST 11; BUN 12; Creatinine, Ser 0.77; Hemoglobin 12.3; Platelets 315; Potassium 4.4; Sodium 140; TSH 2.890     Component Value  Date/Time   CHOL 204 (H) 09/10/2020 1254   TRIG 80 09/10/2020 1254   HDL 66 09/10/2020 1254   CHOLHDL 3.1 09/10/2020 1254   CHOLHDL 2.7 08/25/2016 0935   VLDL 11 08/25/2016 0935   LDLCALC 124 (H) 09/10/2020 1254    Other Studies Reviewed Today:  NST 10/17/2017 Study Result  Narrative & Impression   There was no ST segment deviation noted during stress. Frequent PVC's seen throughout study.  The study is normal. No myocardial ischemia or scar.  This is a low risk study.  Nuclear stress EF: 51%.    Echocardiogram 10/03/2017 Study Conclusions   - Left ventricle: The cavity size was normal. Wall thickness was at  the upper limits of normal. Systolic function was normal. The  estimated ejection fraction was in the range of 55% to 60%. Wall  motion was normal; there were no regional wall motion  abnormalities. The study is not technically sufficient to allow  evaluation of LV diastolic function.  - Aortic valve: Mean gradient (  S): 5 mm Hg. Valve area (VTI): 2.25  cm^2.  - Mitral valve: There was trivial regurgitation.  - Left atrium: The atrium was mildly dilated.  - Right atrium: Central venous pressure (est): 3 mm Hg.  - Atrial septum: No defect or patent foramen ovale was identified.  - Tricuspid valve: There was trivial regurgitation.  - Pulmonary arteries: PA peak pressure: 28 mm Hg (S).  - Pericardium, extracardiac: There was no pericardial effusion.   Impressions:  - Upper normal LV wall thickness with LVEF 55-60% and indeterminate  diastolic function. Mild left atrial enlargement. Trivial mitral  regurgitation. Trivial tricuspid regurgitation with estimated  PASP 28 mmHg.    Holter Monitor 08/28/2017 Study Highlights  Sinus rhythm  Rates 53 to 114 bpm  Average HR 76 bpm  Frequent PVCs  (13% total)  12 couplets, 5 triplets     Assessment and Plan:  1. Dizziness   2. Rapid heart rate   3. PVC's (premature ventricular contractions)   4.  Elevated blood pressure reading in office without diagnosis of hypertension    1.  Dizziness Complaining of increased dizziness when changing positions or standing for long periods.  She was taking meclizine but recently saw an ENT provider who stopped the meclizine.  She is going to have further studies with ENT that are upcoming.  Please get a carotid artery duplex study.  2.  Rapid heart rate Recent complaints of rapid heart rate.  Patient brings with her an EKG strip from EMS showing ventricular bigeminy.  We have ordered a repeat cardiac monitor as noted below and #3.  3. PVC's (premature ventricular contractions) Recent increase in palpitations/racing heart, recent PVCs in a bigeminal pattern on EMS monitor strips which the patient brought with her during her recent episode of racing heart.  While being examined she states she turned her head and felt like she might faint.  Please get a 14-day ZIO monitor states she recently had an episode of dizziness and near syncope.   4.  Elevated blood pressure without diagnosis of hypertension. Continue HCTZ 25 mg daily.  Add amlodipine 2.5 mg p.o. daily due to continued elevation of blood pressure 144/88 today.  5.  DOE Complaining of increased dyspnea on exertion and activity intolerance.  Please get a follow-up echocardiogram.  6.  Chest pressure. Complaining of increased chest pressure with dyspnea on exertion.  Please get a repeat Lexiscan nuclear stress test.   Medication Adjustments/Labs and Tests Ordered: Current medicines are reviewed at length with the patient today.  Concerns regarding medicines are outlined above.   Disposition: Follow-up with Dr. Wyline Mood or APP 6 to 8 weeks  Signed, Rennis Harding, NP 10/14/2020 2:28 PM    [facility] at [facility] [address] [place], [place], [place] [address] Phone: [phone]; Fax: [phone]

## 2020-10-14 ENCOUNTER — Ambulatory Visit (INDEPENDENT_AMBULATORY_CARE_PROVIDER_SITE_OTHER): Payer: BC Managed Care – PPO | Admitting: Family Medicine

## 2020-10-14 ENCOUNTER — Other Ambulatory Visit: Payer: Self-pay

## 2020-10-14 ENCOUNTER — Encounter: Payer: Self-pay | Admitting: Family Medicine

## 2020-10-14 ENCOUNTER — Encounter: Payer: Self-pay | Admitting: *Deleted

## 2020-10-14 VITALS — BP 144/88 | HR 66 | Ht 65.0 in | Wt 274.0 lb

## 2020-10-14 DIAGNOSIS — R030 Elevated blood-pressure reading, without diagnosis of hypertension: Secondary | ICD-10-CM

## 2020-10-14 DIAGNOSIS — R0602 Shortness of breath: Secondary | ICD-10-CM

## 2020-10-14 DIAGNOSIS — I493 Ventricular premature depolarization: Secondary | ICD-10-CM

## 2020-10-14 DIAGNOSIS — R000 Tachycardia, unspecified: Secondary | ICD-10-CM

## 2020-10-14 DIAGNOSIS — R002 Palpitations: Secondary | ICD-10-CM

## 2020-10-14 DIAGNOSIS — R079 Chest pain, unspecified: Secondary | ICD-10-CM

## 2020-10-14 DIAGNOSIS — I10 Essential (primary) hypertension: Secondary | ICD-10-CM

## 2020-10-14 DIAGNOSIS — R42 Dizziness and giddiness: Secondary | ICD-10-CM

## 2020-10-14 MED ORDER — AMLODIPINE BESYLATE 2.5 MG PO TABS
2.5000 mg | ORAL_TABLET | Freq: Every day | ORAL | 1 refills | Status: DC
Start: 2020-10-14 — End: 2021-01-20

## 2020-10-14 NOTE — Addendum Note (Signed)
Addended by: Audry Pili on: 10/14/2020 02:52 PM   Modules accepted: Orders

## 2020-10-14 NOTE — Patient Instructions (Addendum)
Medication Instructions:   Your physician has recommended you make the following change in your medication:   Start amlodipine 2.5 mg by mouth daily  Continue other medications the same  Labwork:  None  Testing/Procedures: Your physician has requested that you have an echocardiogram. Echocardiography is a painless test that uses sound waves to create images of your heart. It provides your doctor with information about the size and shape of your heart and how well your hearts chambers and valves are working. This procedure takes approximately one hour. There are no restrictions for this procedure. Your physician has requested that you have a lexiscan myoview. For further information please visit [email or web address] Please follow instruction sheet, as given. Your physician has requested that you have a carotid duplex. This test is an ultrasound of the carotid arteries in your neck. It looks at blood flow through these arteries that supply the brain with blood. Allow one hour for this exam. There are no restrictions or special instructions. ZIO- Long Term Monitor Instructions   Your physician has requested you wear your ZIO patch monitor 14 days.   This is a single patch monitor.  [employer] supplies one patch monitor per enrollment.  Additional stickers are not available.   Please do not apply patch if you will be having a Nuclear Stress Test, Echocardiogram, Cardiac CT, MRI, or Chest Xray during the time frame you would be wearing the monitor. The patch cannot be worn during these tests.  You cannot remove and re-apply the ZIO XT patch monitor.   Your ZIO patch monitor will be sent USPS Priority mail from [facility] directly to your home address. The monitor may also be mailed to a PO BOX if home delivery is not available.   It may take 3-5 days to receive your monitor after you have been enrolled.   Once you have received you monitor, please review enclosed instructions.  Your  monitor has already been registered assigning a specific monitor serial # to you.   Applying the monitor   Shave hair from upper left chest.   Hold abrader disc by orange tab.  Rub abrader in 40 strokes over left upper chest as indicated in your monitor instructions.   Clean area with 4 enclosed alcohol pads .  Use all pads to assure are is cleaned thoroughly.  Let dry.   Apply patch as indicated in monitor instructions.  Patch will be place under collarbone on left side of chest with arrow pointing upward.   Rub patch adhesive wings for 2 minutes.Remove white label marked "1".  Remove white label marked "2".  Rub patch adhesive wings for 2 additional minutes.   While looking in a mirror, press and release button in center of patch.  A small green light will flash 3-4 times .  This will be your only indicator the monitor has been turned on.     Do not shower for the first 24 hours.  You may shower after the first 24 hours.   Press button if you feel a symptom. You will hear a small click.  Record Date, Time and Symptom in the Patient Log Book.   When you are ready to remove patch, follow instructions on last 2 pages of Patient Log Book.  Stick patch monitor onto last page of Patient Log Book.   Place Patient Log Book in [place] box.  Use locking tab on box and tape box closed securely.  The [place] [employer] has [employer] on  it.  Please place in mailbox as soon as possible.  Your physician should have your test results approximately 7 days after the monitor has been mailed back to [facility].   Call [facility] Customer Care at [phone] if you have questions regarding your ZIO XT patch monitor.  Call them immediately if you see an orange light blinking on your monitor.    If your monitor falls off in less than 4 days contact our Monitor department at [phone].  If your monitor becomes loose or falls off after 4 days call [employer] at [phone] for suggestions on  securing your monitor.  Follow-Up:  Your physician recommends that you schedule a follow-up appointment in: 2 months.  Any Other Special Instructions Will Be Listed Below (If Applicable).  If you need a refill on your cardiac medications before your next appointment, please call your pharmacy.

## 2020-10-15 ENCOUNTER — Other Ambulatory Visit: Payer: Self-pay | Admitting: Nurse Practitioner

## 2020-10-15 DIAGNOSIS — K219 Gastro-esophageal reflux disease without esophagitis: Secondary | ICD-10-CM

## 2020-10-19 ENCOUNTER — Ambulatory Visit (INDEPENDENT_AMBULATORY_CARE_PROVIDER_SITE_OTHER): Payer: BC Managed Care – PPO

## 2020-10-19 DIAGNOSIS — R002 Palpitations: Secondary | ICD-10-CM | POA: Diagnosis not present

## 2020-10-19 DIAGNOSIS — R000 Tachycardia, unspecified: Secondary | ICD-10-CM

## 2020-10-19 DIAGNOSIS — I493 Ventricular premature depolarization: Secondary | ICD-10-CM

## 2020-10-21 ENCOUNTER — Other Ambulatory Visit: Payer: Self-pay

## 2020-10-21 ENCOUNTER — Ambulatory Visit (HOSPITAL_COMMUNITY)
Admission: RE | Admit: 2020-10-21 | Discharge: 2020-10-21 | Disposition: A | Discharge status: Home / Self Care | Payer: BC Managed Care – PPO | Source: Ambulatory Visit | Attending: Family Medicine | Admitting: Family Medicine

## 2020-10-21 ENCOUNTER — Encounter (HOSPITAL_COMMUNITY)
Admission: RE | Admit: 2020-10-21 | Discharge: 2020-10-21 | Disposition: A | Discharge status: Home / Self Care | Payer: BC Managed Care – PPO | Source: Ambulatory Visit | Attending: Family Medicine | Admitting: Family Medicine

## 2020-10-21 ENCOUNTER — Ambulatory Visit (HOSPITAL_BASED_OUTPATIENT_CLINIC_OR_DEPARTMENT_OTHER)
Admission: RE | Admit: 2020-10-21 | Discharge: 2020-10-21 | Disposition: A | Discharge status: Home / Self Care | Payer: BC Managed Care – PPO | Source: Ambulatory Visit | Attending: Family Medicine | Admitting: Family Medicine

## 2020-10-21 DIAGNOSIS — R0602 Shortness of breath: Secondary | ICD-10-CM | POA: Diagnosis not present

## 2020-10-21 DIAGNOSIS — I493 Ventricular premature depolarization: Secondary | ICD-10-CM | POA: Diagnosis not present

## 2020-10-21 DIAGNOSIS — R002 Palpitations: Secondary | ICD-10-CM | POA: Insufficient documentation

## 2020-10-21 DIAGNOSIS — R079 Chest pain, unspecified: Secondary | ICD-10-CM

## 2020-10-21 LAB — NM MYOCAR MULTI W/SPECT W/WALL MOTION / EF
LV dias vol: 43 mL (ref 46–106)
LV sys vol: 35 mL
Peak HR: 120 {beats}/min
RATE: 0.54
Rest HR: 81 {beats}/min
SDS: 1
SRS: 0
SSS: 1
TID: 1.12

## 2020-10-21 LAB — ECHOCARDIOGRAM COMPLETE
Area-P 1/2: 3.03 cm2
S' Lateral: 3.15 cm

## 2020-10-21 MED ORDER — SODIUM CHLORIDE FLUSH 0.9 % IV SOLN
INTRAVENOUS | Status: AC
  Administered 2020-10-21: 10 mL via INTRAVENOUS
  Filled 2020-10-21: qty 10

## 2020-10-21 MED ORDER — REGADENOSON 0.4 MG/5ML IV SOLN
INTRAVENOUS | Status: AC
  Administered 2020-10-21: 0.4 mg via INTRAVENOUS
  Filled 2020-10-21: qty 5

## 2020-10-21 MED ORDER — TECHNETIUM TC 99M TETROFOSMIN IV KIT
10.0000 | PACK | Freq: Once | INTRAVENOUS | Status: AC | PRN
  Administered 2020-10-21: 10 via INTRAVENOUS

## 2020-10-21 MED ORDER — TECHNETIUM TC 99M TETROFOSMIN IV KIT
30.0000 | PACK | Freq: Once | INTRAVENOUS | Status: AC | PRN
  Administered 2020-10-21: 31 via INTRAVENOUS

## 2020-10-21 NOTE — Progress Notes (Signed)
*  PRELIMINARY RESULTS* Echocardiogram 2D Echocardiogram has been performed.  Stacey Drain 10/21/2020, 1:23 PM

## 2020-10-22 ENCOUNTER — Ambulatory Visit (HOSPITAL_COMMUNITY): Payer: BC Managed Care – PPO | Attending: Otolaryngology | Admitting: Physical Therapy

## 2020-10-22 DIAGNOSIS — M545 Low back pain, unspecified: Secondary | ICD-10-CM | POA: Diagnosis not present

## 2020-10-22 DIAGNOSIS — R42 Dizziness and giddiness: Secondary | ICD-10-CM

## 2020-10-22 NOTE — Patient Instructions (Signed)
Horizontal Canal    Sit on bed, head straight forward. Lie slowly on right side. Hold position until symptoms subside. Sit up. Hold position until symptoms subside. Lie down on opposite side. Hold position until symptoms subside. Sit up. Repeat sequence _5___ times per session. Do __3Eye Motion    Stand with hands out in front of head. Move eyes from one hand to the other as quickly as possible. Stop if you become dizzy or nauseous. Repeat __10__ times. Do __3POSITION: Single Leg Balance: Neck Laterally Flexed / Stance Side    Stand on right leg. . Hold _as long as possible __ seconds. 3-5___ reps ___3 times per day.  http://ggbe.exer.[email or web address]   Copyright  VHI. All rights reserved.  __ sessions per day.  http://[email or web address].exer.[email or web address]   Copyright  VHI. All rights reserved.  __ sessions per day.  Copyright  VHI. All rights reserved.

## 2020-10-22 NOTE — Therapy (Signed)
[facility] [address] [place], [place], [address] Phone: [phone]   Fax:  [phone]  Physical Therapy Evaluation  Patient Details  Name: Renee Thomas MRN: [id number] Date of Birth: [birth year] Referring Provider (PT): Suszanne Conners Su   Encounter Date: 10/22/2020   PT End of Session - 10/22/20 1356    Visit Number 1    Number of Visits 12   12 visits over the next 8 weeks due to holiday   Date for PT Re-Evaluation 12/17/20    Authorization - Visit Number 2    Authorization - Number of Visits 30   starts over 11/06/2020   PT Start Time 1315    PT Stop Time 1355    PT Time Calculation (min) 40 min    Activity Tolerance Patient tolerated treatment well    Behavior During Therapy [facility] for tasks assessed/performed           Past Medical History:  Diagnosis Date  . Anemia   . Balance disorder   . GERD (gastroesophageal reflux disease)   . MVP (mitral valve prolapse)   . PVC's (premature ventricular contractions)     No past surgical history on file.  There were no vitals filed for this visit.    Subjective Assessment - 10/22/20 1314    Subjective Renee Thomas states that she has been experiencing off balance she does not feel dizzy  for three years but it has increased since August. She will feel off balance for hours and then she will feel bad for the rest of the day.    She states that the ENT told her that the inner ear was good and took her off the meclazine.  He stated that the signals going to the brain for balance have gotten off and need to be retrained.  She states that she she has had PVCs for years.  Her PVC's have recently increased.  She had an echo yesterday and she is goint to have an echocardiogram on Jan 5th.  Renee Thomas states that she has good days and bad days, when she has bad days she is off balance for long periods of time.  Shestates reading exacerbates her sx., she got her eyes checked and they were fine.  The dizziness  has caused her to leave her job for now    Pertinent History GERD,    How long can you sit comfortably? no problem    How long can you stand comfortably? no problem    How long can you walk comfortably? no problem    Patient Stated Goals to have better balance    Currently in Pain? No/denies              [facility] PT Assessment - 10/22/20 0001      Assessment   Medical Diagnosis vestibular dysfunction    Referring Provider (PT) Suszanne Conners Su    Onset Date/Surgical Date 06/06/20   acute exacerbation   Next MD Visit 12/16/2020    Prior Therapy not for this episode      Precautions   Precautions Fall   PT moves very slow due to fear of falling     Restrictions   Weight Bearing Restrictions No      Balance Screen   Has the patient fallen in the past 6 months No    Has the patient had a decrease in activity level because of a fear of falling?  Yes    Is the  patient reluctant to leave their home because of a fear of falling?  Yes      Prior Function   Level of Independence Independent      Cognition   Overall Cognitive Status Within Functional Limits for tasks assessed      Functional Tests   Functional tests Single leg stance;Sit to Stand      Single Leg Stance   Comments LT 15" ; Rt: 4      Sit to Stand   Comments 8 in 30 seconds no dizziness                  Vestibular Assessment - 10/22/20 0001      Oculomotor Exam   Ocular ROM normal    Smooth Pursuits Saccades    Saccades Poor trajectory      Oculomotor Exam-Fixation Suppressed    Ocular Alignment normal    Ocular ROM normal    Spontaneous Nystagmus no    Head Shaking Nystagmus-Horizontal slight      Positional Sensitivities   Right Hallpike No dizziness    Up from Right Hallpike Moderate dizziness    Up from Left Hallpike Moderate dizziness    Nose to Right Knee No dizziness    Nose to Left Knee Lightheadedness              Objective measurements completed on examination: See above findings.                PT Education - 10/22/20 1355    Education Details HEp    Person(s) Educated Patient    Methods Explanation;Handout;Demonstration;Tactile cues;Verbal cues    Comprehension Verbalized understanding            PT Short Term Goals - 10/22/20 1524      PT SHORT TERM GOAL #1   Title PT to be I in her HEP to decrease her feeling of being off balance to no greater than 5 days per week    Time 3    Period Weeks    Status New    Target Date 11/12/20      PT SHORT TERM GOAL #2   Title Pt to be able to single leg stance on both LE for at lease 20" to reduce her risk of falls    Time 3    Period Weeks    Status New      PT SHORT TERM GOAL #3   Title PT to be able to come up from a side lying postion without experiencing any dizziness    Time 3    Period Weeks    Status New             PT Long Term Goals - 10/22/20 1529      PT LONG TERM GOAL #1   Title PT to be to have had no bouts of being off balance for the past week to feel comfortable to go back to driving.    Time 6    Period Weeks    Status New      PT LONG TERM GOAL #2   Title PT to be able to single leg stance for at least 45 seconds to allow pt to be comfortable going out of her house and walking on her own.    Time 6    Period Weeks    Status Achieved                  Plan -  10/22/20 1359    Clinical Impression Statement Renee Thomas is a 49 yo female who has been having sx of being unbalanced for over 3 years.  The sx increased this year to a point where she is no longer working, she does not drive and she stays at home.  She was checked by the ENT who stated that her inner ear is not the problem she needs to get her brain responding better to motion.  Evaluation demonstrated increased sx with B sidelying to sit as well as nose to Lt knee,decreased balance, negative Dixhallpike manuever B.  The pt had minimal increased sx with eye motion.  Renee Thomas will benefit from skilled PT for  habituation of motion as well as balance activity.    Personal Factors and Comorbidities Comorbidity 2    Comorbidities a-Fib, HTN    Examination-Activity Limitations Bend;Locomotion Level;Other   ddruvubg   Examination-Participation Restrictions Cleaning;Driving;Laundry;Meal Prep;Yard Work;Occupation;Shop    Stability/Clinical Decision Making Evolving/Moderate complexity    Clinical Decision Making Moderate    Rehab Potential Good    PT Frequency 2x / week    PT Duration 6 weeks    PT Treatment/Interventions Vestibular;Balance training;Therapeutic exercise;Therapeutic activities;Manual techniques    PT Next Visit Plan check cervical motion and if there are cervical spasms, begin nose to  Lt knee and turning 90 degree standing habituation, continue to progress balance as able.    PT Home Exercise Plan habituation sidelying to sittng, eyes to RT and LT,  single leg stance           Patient will benefit from skilled therapeutic intervention in order to improve the following deficits and impairments:  Decreased activity tolerance,Decreased balance  Visit Diagnosis: Dizziness and giddiness - Plan: PT plan of care cert/re-cert     Problem List Patient Active Problem List   Diagnosis Date Noted  . Primary hypertension 09/10/2020  . GERD without esophagitis 05/02/2019  . BMI 45.0-49.9, adult (HCC) 05/02/2019  . Low serum vitamin B12 11/17/2016  Virgina Organ, PT CLT [phone] 10/22/2020, 3:37 PM  [facility] [address] [place], [place], [address] Phone: [phone]   Fax:  [phone]  Name: Renee Thomas MRN: [id number] Date of Birth: Jul 02, 1971

## 2020-10-25 ENCOUNTER — Telehealth: Payer: Self-pay

## 2020-10-25 ENCOUNTER — Ambulatory Visit (INDEPENDENT_AMBULATORY_CARE_PROVIDER_SITE_OTHER): Payer: BC Managed Care – PPO | Admitting: *Deleted

## 2020-10-25 ENCOUNTER — Other Ambulatory Visit: Payer: Self-pay

## 2020-10-25 ENCOUNTER — Ambulatory Visit (HOSPITAL_COMMUNITY): Payer: BC Managed Care – PPO | Admitting: Physical Therapy

## 2020-10-25 ENCOUNTER — Telehealth: Payer: Self-pay | Admitting: *Deleted

## 2020-10-25 ENCOUNTER — Encounter (HOSPITAL_COMMUNITY): Payer: Self-pay | Admitting: Physical Therapy

## 2020-10-25 DIAGNOSIS — R42 Dizziness and giddiness: Secondary | ICD-10-CM

## 2020-10-25 DIAGNOSIS — M545 Low back pain, unspecified: Secondary | ICD-10-CM | POA: Diagnosis not present

## 2020-10-25 DIAGNOSIS — E538 Deficiency of other specified B group vitamins: Secondary | ICD-10-CM | POA: Diagnosis not present

## 2020-10-25 MED ORDER — CYANOCOBALAMIN 1000 MCG/ML IJ SOLN
1000.0000 ug | INTRAMUSCULAR | Status: AC
  Administered 2020-10-25 – 2021-10-19 (×12): 1000 ug via INTRAMUSCULAR

## 2020-10-25 NOTE — Progress Notes (Signed)
B12 Injection given and tolerated well °

## 2020-10-25 NOTE — Telephone Encounter (Signed)
Patient informed. Copy sent to PCP °

## 2020-10-25 NOTE — Telephone Encounter (Signed)
Pt called regarding her short term disability Form.  Says the company received her paperwork from [place] but says there are a few issues with the paperwork that need to be corrected and resent.  The claim date should say 09/17/20 because that is when her job has her first day out of work.  Also needs notes that pt had an echocardiagram and stress test done, and is now wearing a heart monitor, and has carotid artery test tomorrow.  [phone]

## 2020-10-25 NOTE — Telephone Encounter (Signed)
Prior FMLA ppw updated  Please advise on request for other note request

## 2020-10-25 NOTE — Telephone Encounter (Signed)
-----   Message from Netta Neat., NP sent at 10/21/2020  4:34 PM EST ----- Stress test did not show any lack of blood flow through the coronary arteries.  This was considered a low risk test per the physician who read the study.

## 2020-10-25 NOTE — Telephone Encounter (Signed)
-----   Message from Netta Neat., NP sent at 10/21/2020  4:33 PM EST ----- Echocardiogram shows good pumping function.  Left ventricle is a little thickened.  Best way to manage is to make sure blood pressure stays at goal below 130/80.  Has a very mildly leaking mitral valve.

## 2020-10-25 NOTE — Telephone Encounter (Signed)
Please brin back by here and et [place] know what needs ot be corrected

## 2020-10-25 NOTE — Patient Instructions (Signed)
Access Code: [email or web address] URL: [email or web address] Date: 10/25/2020 Prepared by: Georges Lynch  Exercises Seated Gaze Stabilization with Head Rotation - 3 x daily - 7 x weekly - 2 sets - 10 reps Romberg Stance with Head Nods - 3 x daily - 7 x weekly - 2 sets - 10 reps Romberg Stance with Head Rotation - 3 x daily - 7 x weekly - 2 sets - 10 reps

## 2020-10-25 NOTE — Therapy (Signed)
[place] [address] [place], [place], [address] Phone: [phone]   Fax:  [phone]  Physical Therapy Treatment  Patient Details  Name: TEXIE TUPOU MRN: [id number] Date of Birth: 1971/03/14 Referring Provider (PT): Suszanne Conners Su   Encounter Date: 10/25/2020   PT End of Session - 10/25/20 1129    Visit Number 2    Number of Visits 12   12 visits over the next 8 weeks due to holiday   Date for PT Re-Evaluation 12/17/20    Authorization - Visit Number 3    Authorization - Number of Visits 30   starts over 11/06/2020   PT Start Time 1118    PT Stop Time 1156    PT Time Calculation (min) 38 min    Activity Tolerance Patient tolerated treatment well    Behavior During Therapy [facility] for tasks assessed/performed           Past Medical History:  Diagnosis Date  . Anemia   . Balance disorder   . GERD (gastroesophageal reflux disease)   . MVP (mitral valve prolapse)   . PVC's (premature ventricular contractions)     History reviewed. No pertinent surgical history.  There were no vitals filed for this visit.   Subjective Assessment - 10/25/20 1128    Subjective Patient reports no new episodes with balance issues since Eval. Says she has been practicing HEP hand these are going well. Says she was dizzy with supine to sit at first but this got better. Feels most off balance when standing in place for a long time, or sometimes when standing.    Pertinent History GERD,    How long can you sit comfortably? no problem    How long can you stand comfortably? no problem    How long can you walk comfortably? no problem    Patient Stated Goals to have better balance                              Vestibular Treatment/Exercise - 10/25/20 0001      Vestibular Treatment/Exercise   Habituation Exercises Seated Diagonal Head Turns;Brandt Daroff    Gaze Exercises --   Saccades 2 x20, VOR x 1 2 x 20     Brandt Daroff   Number  of Reps  5    Symptom Description  x 5 each (no symptoms)      Seated Diagonal Head Turns   Number of Reps 10    Symptoms Description  x10 nose to LT knee, x10 nose to RT knee   (slight dizziness with nose to RT knee, subsides in seconds)             Balance Exercises - 10/25/20 0001      Balance Exercises: Standing   Standing Eyes Opened Head turns;Narrow base of support (BOS)   x10 horizontal, x10 vertical   SLS Eyes open;Solid surface;3 reps;20 secs               PT Short Term Goals - 10/22/20 1524      PT SHORT TERM GOAL #1   Title PT to be I in her HEP to decrease her feeling of being off balance to no greater than 5 days per week    Time 3    Period Weeks    Status New    Target Date 11/12/20      PT SHORT TERM GOAL #  2   Title Pt to be able to single leg stance on both LE for at lease 20" to reduce her risk of falls    Time 3    Period Weeks    Status New      PT SHORT TERM GOAL #3   Title PT to be able to come up from a side lying postion without experiencing any dizziness    Time 3    Period Weeks    Status New             PT Long Term Goals - 10/22/20 1536      PT LONG TERM GOAL #1   Target Date 12/03/20                 Plan - 10/25/20 1156    Clinical Impression Statement Patient tolerated session well today. Initiated treatment program. Review therapy goals and HEP. Progressed vestibular training and habituation activity. Patient noted slight dizziness with VOR as well as seated nose to RT exercise. These symptoms were very mild and lasted seconds. Patient was most challenged with standing head nods in narrowed stance. She was unsteady with this and demos mod sway but no LOB. Educated patient on purpose and function of all added exercise today and issued updated HEP handout. Patient will continue to benefit from skilled therapy services to progress balance and habituation exercise to reduce symptoms and improve LOF with ADLs.    Personal  Factors and Comorbidities Comorbidity 2    Comorbidities a-Fib, HTN    Examination-Activity Limitations Bend;Locomotion Level;Other   ddruvubg   Examination-Participation Restrictions Cleaning;Driving;Laundry;Meal Prep;Yard Work;Occupation;Shop    Stability/Clinical Decision Making Evolving/Moderate complexity    Rehab Potential Good    PT Frequency 2x / week    PT Duration 6 weeks    PT Treatment/Interventions Vestibular;Balance training;Therapeutic exercise;Therapeutic activities;Manual techniques    PT Next Visit Plan Continue to progress balance and habituation activity as able. Add compliant surface wiht head turns and single leg stand when able. Dyanamic balanace (gait with head turns/ nods) when ready.    PT Home Exercise Plan habituation sidelying to sittng, eyes to RT and LT,  single leg stance 10/25/20: VOR x 1, standing head nods and turns in NBOS           Patient will benefit from skilled therapeutic intervention in order to improve the following deficits and impairments:  Decreased activity tolerance,Decreased balance  Visit Diagnosis: Dizziness and giddiness     Problem List Patient Active Problem List   Diagnosis Date Noted  . Primary hypertension 09/10/2020  . GERD without esophagitis 05/02/2019  . BMI 45.0-49.9, adult (HCC) 05/02/2019  . Low serum vitamin B12 11/17/2016    12:04 PM, 10/25/20 Georges Lynch PT DPT  Physical Therapist with [place]  [facility]  [phone]   [facility] [address] [place], [place], [address] Phone: [phone]   Fax:  [phone]  Name: MIYANA MORDECAI MRN: [id number] Date of Birth: 1971/09/20

## 2020-10-26 ENCOUNTER — Telehealth: Payer: Self-pay | Admitting: *Deleted

## 2020-10-26 ENCOUNTER — Ambulatory Visit (INDEPENDENT_AMBULATORY_CARE_PROVIDER_SITE_OTHER): Payer: BC Managed Care – PPO

## 2020-10-26 DIAGNOSIS — R42 Dizziness and giddiness: Secondary | ICD-10-CM

## 2020-10-26 NOTE — Telephone Encounter (Signed)
Patient informed. Copy sent to PCP °

## 2020-10-26 NOTE — Telephone Encounter (Signed)
Needing to add taking PT 2x wk x 6wk, echo, stress test, wearing a heart monitor. Form updated, resigned by provider and faxed to company

## 2020-10-26 NOTE — Telephone Encounter (Signed)
-----   Message from Netta Neat., NP sent at 10/26/2020  3:12 PM EST ----- Please call the patient and let her know the carotid artery study showed no evidence of carotid artery disease.  Thank you

## 2020-10-27 ENCOUNTER — Ambulatory Visit (HOSPITAL_COMMUNITY): Payer: BC Managed Care – PPO

## 2020-10-27 ENCOUNTER — Encounter (HOSPITAL_COMMUNITY): Payer: Self-pay

## 2020-10-27 ENCOUNTER — Other Ambulatory Visit: Payer: Self-pay

## 2020-10-27 DIAGNOSIS — M545 Low back pain, unspecified: Secondary | ICD-10-CM | POA: Diagnosis not present

## 2020-10-27 DIAGNOSIS — R42 Dizziness and giddiness: Secondary | ICD-10-CM | POA: Diagnosis not present

## 2020-10-27 NOTE — Therapy (Signed)
[place], [place], [address] Phone: [phone]   Fax:  [phone]  Physical Therapy Treatment  Patient Details  Name: NEESA KNAPIK MRN: [id number] Date of Birth: [birth year] Referring Provider (PT): Benjamine Mola Su   Encounter Date: 10/27/2020   PT End of Session - 10/27/20 1257    Visit Number 3    Number of Visits 12    Date for PT Re-Evaluation 12/17/20    Authorization Type BC[employer]    Authorization - Visit Number 3    Authorization - Number of Visits 30   start over 11/06/20   PT Start Time 0918    PT Stop Time 0958    PT Time Calculation (min) 40 min    Activity Tolerance Patient tolerated treatment well    Behavior During Therapy [facility] for tasks assessed/performed           Past Medical History:  Diagnosis Date  . Anemia   . Balance disorder   . GERD (gastroesophageal reflux disease)   . MVP (mitral valve prolapse)   . PVC's (premature ventricular contractions)     History reviewed. No pertinent surgical history.  There were no vitals filed for this visit.   Subjective Assessment - 10/27/20 0922    Subjective Reports she hasn't dizzy episodes in the last 4 days, does occasionally have light headed feeling that comes and goes.  Notices when she hasn't had a good night sleep increased symptoms the next day.    Pertinent History GERD,    Patient Stated Goals to have better balance    Currently in Pain? No/denies                              Vestibular Treatment/Exercise - 10/27/20 0001      Vestibular Treatment/Exercise   Gaze Exercises X1 Viewing Horizontal;Eye/Head Exercise Horizontal;X2 Viewing Horizontal;X2 Viewing Vertical      X1 Viewing Horizontal   Foot Position partial tandem stance    Reps 10      X2 Viewing Horizontal   Foot Position tandem stance    Reps 10     Comments eyes first then head turns      X2 Viewing Vertical   Foot Position tandem stance    Reps 10     Comments eyes first then head turns      Eye/Head Exercise Horizontal   Foot Position tandem stance    Reps 10              Balance Exercises - 10/27/20 0001      Balance Exercises: Standing   Tandem Stance Eyes open;3 reps;Foam/compliant surface    SLS Eyes open;Solid surface   Lt 49", Rt 42" max of 3   Gait with Head Turns Forward;3 reps   DGI acitivities 3RT              PT Short Term Goals - 10/27/20 0946      PT SHORT TERM GOAL #1   Title PT to be I in her HEP to decrease her feeling of being off balance to no greater than 5 days per week    Baseline 10/27/20:  Reports compliance with HEP daily, reports 4 days since dizzy spell    Status Achieved      PT SHORT TERM GOAL #2   Title Pt to be able to single leg stance on both LE for at lease  20" to reduce her risk of falls    Status Achieved      PT SHORT TERM GOAL #3   Title PT to be able to come up from a side lying postion without experiencing any dizziness    Baseline 10/27/20:  Reports ability to complete sidelying to sit wihtout dizziness since day after initial eval    Status Achieved             PT Long Term Goals - 10/27/20 0944      PT LONG TERM GOAL #1   Title PT to be to have had no bouts of being off balance for the past week to feel comfortable to go back to driving.    Status Partially Met      PT LONG TERM GOAL #2   Title PT to be able to single leg stance for at least 45 seconds to allow pt to be comfortable going out of her house and walking on her own.    Baseline 12/22:  Reports comfortable walking around the house, haven't attempted outside    Status Partially Met                 Plan - 10/27/20 1258    Clinical Impression Statement Pt is progressing well towards goals.  Progressed this session to more NBOS activities and incorporated gaze activities.  Pt did required intermittent HHA wiht tandem stance activities.  Also completed DGI activities, pt with able to keep same  cadence though very slow especially with horizontal head turns.  Added dynamic surface with tandem stance, required intermittent HHA, cueing to improve focal point with surface, did not attempt head turns this session.  No reports of dizziness through session.  Pt encouraged to begin walking outdoors with family members to improve confidence.    Personal Factors and Comorbidities Comorbidity 2    Comorbidities a-Fib, HTN    Examination-Activity Limitations Bend;Locomotion Level;Other    Examination-Participation Restrictions Cleaning;Driving;Laundry;Meal Prep;Yard Work;Occupation;Shop    Stability/Clinical Decision Making Evolving/Moderate complexity    Clinical Decision Making Moderate    Rehab Potential Good    PT Frequency 2x / week    PT Duration 6 weeks    PT Treatment/Interventions Vestibular;Balance training;Therapeutic exercise;Therapeutic activities;Manual techniques    PT Next Visit Plan Continue to progress balance and habituation activity as able. Add compliant surface wiht head turns and single leg stand when able. Dyanamic balanace (gait with head turns/ nods) when ready.    PT Home Exercise Plan habituation sidelying to sittng, eyes to RT and LT,  single leg stance 10/25/20: VOR x 1, standing head nods and turns in NBOS           Patient will benefit from skilled therapeutic intervention in order to improve the following deficits and impairments:  Decreased activity tolerance,Decreased balance  Visit Diagnosis: Dizziness and giddiness     Problem List Patient Active Problem List   Diagnosis Date Noted  . Primary hypertension 09/10/2020  . GERD without esophagitis 05/02/2019  . BMI 45.0-49.9, adult ([place]) 05/02/2019  . Low serum vitamin B12 11/17/2016   Ihor Austin, LPTA/CLT; CBIS [phone]  Aldona Lento 10/27/2020, 1:09 PM  [employer] [address] [place], [place], [address] Phone: [phone]   Fax:   [phone]  Name: NORLEEN XIE MRN: [id number] Date of Birth: 03-03-1971

## 2020-11-02 ENCOUNTER — Encounter (HOSPITAL_COMMUNITY): Payer: Self-pay | Admitting: Physical Therapy

## 2020-11-02 ENCOUNTER — Other Ambulatory Visit: Payer: Self-pay

## 2020-11-02 ENCOUNTER — Ambulatory Visit (HOSPITAL_COMMUNITY): Payer: BC Managed Care – PPO | Admitting: Physical Therapy

## 2020-11-02 DIAGNOSIS — R42 Dizziness and giddiness: Secondary | ICD-10-CM | POA: Diagnosis not present

## 2020-11-02 DIAGNOSIS — M545 Low back pain, unspecified: Secondary | ICD-10-CM | POA: Diagnosis not present

## 2020-11-02 NOTE — Therapy (Signed)
[facility] [address] [place], [place], [address] Phone: [phone]   Fax:  [phone]  Physical Therapy Treatment  Patient Details  Name: Renee Thomas MRN: [id number] Date of Birth: [birth year] Referring Provider (PT): Benjamine Mola Su   Encounter Date: 11/02/2020   PT End of Session - 11/02/20 1006    Visit Number 4    Number of Visits 12    Date for PT Re-Evaluation 12/17/20    Authorization Type BCBS    Authorization - Visit Number 4    Authorization - Number of Visits 30   start over 11/06/20   PT Start Time 1008   pt late to session   PT Stop Time 1042    PT Time Calculation (min) 34 min    Activity Tolerance Patient tolerated treatment well    Behavior During Therapy [facility] for tasks assessed/performed           Past Medical History:  Diagnosis Date  . Anemia   . Balance disorder   . GERD (gastroesophageal reflux disease)   . MVP (mitral valve prolapse)   . PVC's (premature ventricular contractions)     History reviewed. No pertinent surgical history.  There were no vitals filed for this visit.   Subjective Assessment - 11/02/20 1006    Subjective States that is not having any dizziness and that she had some Sunday when she was doing her exercises and after showering. States overall she feels better and that her balance is better. Reports today she feels a little off balance. No pain reported.    Pertinent History GERD,    Patient Stated Goals to have better balance    Currently in Pain? No/denies                                  Balance Exercises - 11/02/20 0001      Balance Exercises: Standing   Tandem Stance Eyes open;Foam/compliant surface;3 reps   head turns x5 Bilat head and feet; last trial used squishy half foam roll for tactile cue with1 UE support   SLS Eyes open;3 reps;30 secs;Solid surface    Marching Foam/compliant surface;15 reps;Intermittent upper extremity assist   2x15 B onto  dynadisc, verbal cues for trunk alignment   Sit to Stand Standard surface;Without upper extremity support   2x5 Bilat head turns   Other Standing Exercises Stepups onto dynadisc alt LE lead x10B, verbal cues for foot placement and glute activation for support. UE support in parallel bars prn.               PT Short Term Goals - 10/27/20 0946      PT SHORT TERM GOAL #1   Title PT to be I in her HEP to decrease her feeling of being off balance to no greater than 5 days per week    Baseline 10/27/20:  Reports compliance with HEP daily, reports 4 days since dizzy spell    Status Achieved      PT SHORT TERM GOAL #2   Title Pt to be able to single leg stance on both LE for at lease 20" to reduce her risk of falls    Status Achieved      PT SHORT TERM GOAL #3   Title PT to be able to come up from a side lying postion without experiencing any dizziness    Baseline 10/27/20:  Reports ability  to complete sidelying to sit wihtout dizziness since day after initial eval    Status Achieved             PT Long Term Goals - 10/27/20 0944      PT LONG TERM GOAL #1   Title PT to be to have had no bouts of being off balance for the past week to feel comfortable to go back to driving.    Status Partially Met      PT LONG TERM GOAL #2   Title PT to be able to single leg stance for at least 45 seconds to allow pt to be comfortable going out of her house and walking on her own.    Baseline 12/22:  Reports comfortable walking around the house, haven't attempted outside    Status Partially Met                 Plan - 11/02/20 1043    Clinical Impression Statement Able to complete SLS x30" bilaterally with increased sway and ankle strategies on Left vs Right. Increased difficulty head turning to Left versus Right when in tandem with both lower extremity leading, tendency to reach for bars when turning head to L from either right turn to neutral or neutral to L, with improvement as  progressed within sets, LLE leading set 2 no LOB requiring UE assist. Fatigue noted end of session but overall improved balance with reduced dizzy symptoms noted. Will continue with POC.    Personal Factors and Comorbidities Comorbidity 2    Comorbidities a-Fib, HTN    Examination-Activity Limitations Bend;Locomotion Level;Other    Examination-Participation Restrictions Cleaning;Driving;Laundry;Meal Prep;Yard Work;Occupation;Shop    Stability/Clinical Decision Making Evolving/Moderate complexity    Rehab Potential Good    PT Frequency 2x / week    PT Duration 6 weeks    PT Treatment/Interventions Vestibular;Balance training;Therapeutic exercise;Therapeutic activities;Manual techniques    PT Next Visit Plan Continue to progress balance and habituation activity as able. Add compliant surface wiht head turns and single leg stand when able. Dyanamic balanace (gait with head turns/ nods) when ready.    PT Home Exercise Plan habituation sidelying to sittng, eyes to RT and LT,  single leg stance 10/25/20: VOR x 1, standing head nods and turns in NBOS; 12/28 tandem with head turns on pillow           Patient will benefit from skilled therapeutic intervention in order to improve the following deficits and impairments:  Decreased activity tolerance,Decreased balance  Visit Diagnosis: Dizziness and giddiness  Acute right-sided low back pain, unspecified whether sciatica present     Problem List Patient Active Problem List   Diagnosis Date Noted  . Primary hypertension 09/10/2020  . GERD without esophagitis 05/02/2019  . BMI 45.0-49.9, adult ([place]) 05/02/2019  . Low serum vitamin B12 11/17/2016   10:46 AM, 11/02/20 Jerene Pitch, DPT Physical Therapy with [facility]  [phone] office  [facility] [address] [place], [place], [address] Phone: [phone]   Fax:  [phone]  Name: Renee Thomas MRN: [id number] Date  of Birth: 07/26/1971

## 2020-11-04 ENCOUNTER — Encounter (HOSPITAL_COMMUNITY): Payer: Self-pay | Admitting: Physical Therapy

## 2020-11-04 ENCOUNTER — Other Ambulatory Visit: Payer: Self-pay

## 2020-11-04 ENCOUNTER — Ambulatory Visit (HOSPITAL_COMMUNITY): Payer: BC Managed Care – PPO | Admitting: Physical Therapy

## 2020-11-04 DIAGNOSIS — M545 Low back pain, unspecified: Secondary | ICD-10-CM | POA: Diagnosis not present

## 2020-11-04 DIAGNOSIS — R42 Dizziness and giddiness: Secondary | ICD-10-CM | POA: Diagnosis not present

## 2020-11-04 NOTE — Therapy (Signed)
[facility] [address] [place], [place], [address] Phone: [phone]   Fax:  [phone]  Physical Therapy Treatment  Patient Details  Name: Renee Thomas MRN: [id number] Date of Birth: 1971/05/21 Referring Provider (PT): Benjamine Mola Su   Encounter Date: 11/04/2020   PT End of Session - 11/04/20 0951    Visit Number 5    Number of Visits 12    Date for PT Re-Evaluation 12/17/20    Authorization Type BCBS    Authorization - Visit Number 5    Authorization - Number of Visits 30   start over 11/06/20   PT Start Time 0947    PT Stop Time 1028    PT Time Calculation (min) 41 min    Activity Tolerance Patient tolerated treatment well    Behavior During Therapy [facility] for tasks assessed/performed           Past Medical History:  Diagnosis Date  . Anemia   . Balance disorder   . GERD (gastroesophageal reflux disease)   . MVP (mitral valve prolapse)   . PVC's (premature ventricular contractions)     History reviewed. No pertinent surgical history.  There were no vitals filed for this visit.   Subjective Assessment - 11/04/20 0951    Subjective Patient says her dizziness is getting better. Last episode was Sunday. Was walking around in the house and felt a little "off balance". No falls.    Pertinent History GERD,    Patient Stated Goals to have better balance    Currently in Pain? No/denies                                  Balance Exercises - 11/04/20 0001      Balance Exercises: Standing   Tandem Stance Foam/compliant surface;4 reps;30 secs   2 reps on solid floor, 2 reps on foam   SLS Eyes open;Solid surface;4 reps;Foam/compliant surface;20 secs   2 x solid floor, 2 x foam   Gait with Head Turns Forward;2 reps    Tandem Gait Forward;2 reps    Other Standing Exercises Comments tandem on solid floor with head turns x20, NBOS on foam with head turns x20, NBOS on foam with head nods x20, tandem stance on foam  with head turns 2 x 10, VOR x 1 on foam x20               PT Short Term Goals - 10/27/20 0946      PT SHORT TERM GOAL #1   Title PT to be I in her HEP to decrease her feeling of being off balance to no greater than 5 days per week    Baseline 10/27/20:  Reports compliance with HEP daily, reports 4 days since dizzy spell    Status Achieved      PT SHORT TERM GOAL #2   Title Pt to be able to single leg stance on both LE for at lease 20" to reduce her risk of falls    Status Achieved      PT SHORT TERM GOAL #3   Title PT to be able to come up from a side lying postion without experiencing any dizziness    Baseline 10/27/20:  Reports ability to complete sidelying to sit wihtout dizziness since day after initial eval    Status Achieved             PT  Long Term Goals - 10/27/20 0944      PT LONG TERM GOAL #1   Title PT to be to have had no bouts of being off balance for the past week to feel comfortable to go back to driving.    Status Partially Met      PT LONG TERM GOAL #2   Title PT to be able to single leg stance for at least 45 seconds to allow pt to be comfortable going out of her house and walking on her own.    Baseline 12/22:  Reports comfortable walking around the house, haven't attempted outside    Status Partially Met                 Plan - 11/04/20 1027    Clinical Impression Statement Patient progressing well toward goals and shows improvement with static balance. Patient does well with all static balance tasks, but most challenged with single limb standing, though no reported dizziness. Progressed to dynamic gait, patient educated on purpose and function. Patient tolerated this well, no increase in symptoms. Progressed vestibular exercise to VOR on foam. Patient was well challenged with this and showed decreased visual tracking with overshooting to LT 70% of the time. Patient does note dizziness with this activity but symptoms subside within seconds after  sitting. Patient able to perform seated VOR with no issues. Patient will continue to benefit from skilled therapy services to progress vestibular exercise to reduce vertigo symptoms and improve LOF with ADLs.    Personal Factors and Comorbidities Comorbidity 2    Comorbidities a-Fib, HTN    Examination-Activity Limitations Bend;Locomotion Level;Other    Examination-Participation Restrictions Cleaning;Driving;Laundry;Meal Prep;Yard Work;Occupation;Shop    Stability/Clinical Decision Making Evolving/Moderate complexity    Rehab Potential Good    PT Frequency 2x / week    PT Duration 6 weeks    PT Treatment/Interventions Vestibular;Balance training;Therapeutic exercise;Therapeutic activities;Manual techniques    PT Next Visit Plan Continue to progress balance and habituation activity as able. Add compliant surface wiht head turns and single leg stand when able. Dyanamic balanace (gait with head turns/ nods) when ready.    PT Home Exercise Plan habituation sidelying to sittng, eyes to RT and LT,  single leg stance 10/25/20: VOR x 1, standing head nods and turns in NBOS; 12/28 tandem with head turns on pillow 11/04/20: standing VOR    Consulted and Agree with Plan of Care Patient           Patient will benefit from skilled therapeutic intervention in order to improve the following deficits and impairments:  Decreased activity tolerance,Decreased balance  Visit Diagnosis: Dizziness and giddiness  Acute right-sided low back pain, unspecified whether sciatica present     Problem List Patient Active Problem List   Diagnosis Date Noted  . Primary hypertension 09/10/2020  . GERD without esophagitis 05/02/2019  . BMI 45.0-49.9, adult ([place]) 05/02/2019  . Low serum vitamin B12 11/17/2016    1:29 PM, 11/04/20 Josue Hector PT DPT  Physical Therapist with [facility]  [phone] [place], [place], [address] Phone: [phone]   Fax:  [phone]  Name: Renee Thomas MRN: [id number] Date of Birth: 1970-11-15

## 2020-11-06 DIAGNOSIS — R7989 Other specified abnormal findings of blood chemistry: Secondary | ICD-10-CM

## 2020-11-06 DIAGNOSIS — E559 Vitamin D deficiency, unspecified: Secondary | ICD-10-CM

## 2020-11-06 HISTORY — DX: Other specified abnormal findings of blood chemistry: R79.89

## 2020-11-06 HISTORY — DX: Vitamin D deficiency, unspecified: E55.9

## 2020-11-08 ENCOUNTER — Telehealth: Payer: Self-pay | Admitting: Nurse Practitioner

## 2020-11-08 ENCOUNTER — Ambulatory Visit (HOSPITAL_COMMUNITY): Payer: BC Managed Care – PPO | Attending: Otolaryngology

## 2020-11-08 ENCOUNTER — Encounter (HOSPITAL_COMMUNITY): Payer: Self-pay

## 2020-11-08 ENCOUNTER — Other Ambulatory Visit: Payer: Self-pay

## 2020-11-08 DIAGNOSIS — R42 Dizziness and giddiness: Secondary | ICD-10-CM | POA: Insufficient documentation

## 2020-11-08 DIAGNOSIS — M545 Low back pain, unspecified: Secondary | ICD-10-CM

## 2020-11-08 NOTE — Therapy (Signed)
[facility] [place], [place], [address] Phone: [phone]   Fax:  [phone]  Physical Therapy Treatment  Patient Details  Name: Renee Thomas MRN: [id number] Date of Birth: [birth year] Referring Provider (PT): Benjamine Mola Su   Encounter Date: 11/08/2020   PT End of Session - 11/08/20 0908    Visit Number 6    Number of Visits 12    Date for PT Re-Evaluation 12/17/20    Authorization Type BCBS    Authorization - Visit Number 6    Authorization - Number of Visits 30   start over 11/06/20   PT Start Time 0900    PT Stop Time 0945    PT Time Calculation (min) 45 min    Activity Tolerance Patient tolerated treatment well    Behavior During Therapy [facility] for tasks assessed/performed           Past Medical History:  Diagnosis Date  . Anemia   . Balance disorder   . GERD (gastroesophageal reflux disease)   . MVP (mitral valve prolapse)   . PVC's (premature ventricular contractions)     History reviewed. No pertinent surgical history.  There were no vitals filed for this visit.   Subjective Assessment - 11/08/20 0905    Subjective Patient reports that her dizziness is getting somewhat better but notices episodes when first in the AM, when performing VOR x 1, and reaching/looking up in overhead cabinets.  Patient reports no falls or noticeable LOB    Pertinent History GERD,    Patient Stated Goals to have better balance                             [facility] Adult PT Treatment/Exercise - 11/08/20 0001      Balance   Balance Assessed Yes      Neuro Re-ed    Neuro Re-ed Details  Dynamic gait with head turns and up/down to challenge viestibular, Static standing foam pad x 60 sec eyes open/closed. Staggered stance 3x15 sec. Staggered stance performing VORx1 using red med ball for target. VOR x 2 with staggered stance for narrow BOS. Standing forward reach 10x to improve safety with position change. Tandem standing  firm ground withstanding postural perturbations 2x15 sec left/right. Tandem standing on compliant surface 2x15 sec left/right. Forward reach with feet in dorsiflexed position 10x. Squat-lift with 2kg med ball from base cabinet to overhead cabinet 10x for dynamic balance challenge.  Ball pass 20x back-forth with therapist. [place] twist passing ball with therapist 10x left/right with patient reporting dizziness when turning left > right                  PT Education - 11/08/20 0915    Education Details Patient education on rationale for habituation activities and techniques to progress for HEP    Person(s) Educated Patient    Methods Explanation    Comprehension Verbalized understanding            PT Short Term Goals - 10/27/20 0946      PT SHORT TERM GOAL #1   Title PT to be I in her HEP to decrease her feeling of being off balance to no greater than 5 days per week    Baseline 10/27/20:  Reports compliance with HEP daily, reports 4 days since dizzy spell    Status Achieved      PT SHORT TERM GOAL #2   Title  Pt to be able to single leg stance on both LE for at lease 20" to reduce her risk of falls    Status Achieved      PT SHORT TERM GOAL #3   Title PT to be able to come up from a side lying postion without experiencing any dizziness    Baseline 10/27/20:  Reports ability to complete sidelying to sit wihtout dizziness since day after initial eval    Status Achieved             PT Long Term Goals - 10/27/20 0944      PT LONG TERM GOAL #1   Title PT to be to have had no bouts of being off balance for the past week to feel comfortable to go back to driving.    Status Partially Met      PT LONG TERM GOAL #2   Title PT to be able to single leg stance for at least 45 seconds to allow pt to be comfortable going out of her house and walking on her own.    Baseline 12/22:  Reports comfortable walking around the house, haven't attempted outside    Status Partially Met                  Plan - 11/08/20 0917    Clinical Impression Statement Patient progressing with POC details and demonstrates notieable LOB when ambulating with head rotations left > right.  Minimal LOB with static standing balance challenges but increase in LOB when perofrmig dynamic rotations left > right with patient able to self-correct to prevent LOB.  Patient able to recall and recite relevant vestibular activities and techniuqes for habituation.    Personal Factors and Comorbidities Comorbidity 2    Comorbidities a-Fib, HTN    Examination-Activity Limitations Bend;Locomotion Level;Other    Examination-Participation Restrictions Cleaning;Driving;Laundry;Meal Prep;Yard Work;Occupation;Shop    Stability/Clinical Decision Making Evolving/Moderate complexity    Rehab Potential Good    PT Frequency 2x / week    PT Duration 6 weeks    PT Treatment/Interventions Vestibular;Balance training;Therapeutic exercise;Therapeutic activities;Manual techniques    PT Next Visit Plan Continue to progress balance and habituation activity as able. Add compliant surface wiht head turns and single leg stand when able. Dyanamic balanace (gait with head turns/ nods) when ready. Continue with [place] twist activities/variations for dynamic challenge    PT Home Exercise Plan habituation sidelying to sittng, eyes to RT and LT,  single leg stance 10/25/20: VOR x 1, standing head nods and turns in NBOS; 12/28 tandem with head turns on pillow 11/04/20: standing VOR. 11/08/20: forward reaches flat ground and with feet dorsiflexed    Consulted and Agree with Plan of Care Patient           Patient will benefit from skilled therapeutic intervention in order to improve the following deficits and impairments:  Decreased activity tolerance,Decreased balance  Visit Diagnosis: Dizziness and giddiness  Acute right-sided low back pain, unspecified whether sciatica present     Problem List Patient Active Problem List    Diagnosis Date Noted  . Primary hypertension 09/10/2020  . GERD without esophagitis 05/02/2019  . BMI 45.0-49.9, adult ([place]) 05/02/2019  . Low serum vitamin B12 11/17/2016   9:47 AM, 11/08/20 M. Sherlyn Lees, PT, DPT Physical Therapist- [place] Office Number: [phone]  [facility] [address] [place], [place], [address] Phone: [phone]   Fax:  [phone]  Name: CHERISE FEDDER MRN: [id number] Date of  Birth: Aug 05, 1971

## 2020-11-08 NOTE — Telephone Encounter (Signed)
Pt stated that her short term disability dates were incorrect when they were faxed. Her first day out 11/12 and estimated date to return to work day is still undetermined.   Phone # to principal where short term disability is [phone] ext [phone]   Claim # [id number]

## 2020-11-09 DIAGNOSIS — R002 Palpitations: Secondary | ICD-10-CM | POA: Diagnosis not present

## 2020-11-09 NOTE — Telephone Encounter (Signed)
Talked with patient about STD ppw with dates and add't information that needed to be on it, will fill out new forms

## 2020-11-10 ENCOUNTER — Ambulatory Visit (HOSPITAL_COMMUNITY): Payer: BC Managed Care – PPO

## 2020-11-10 ENCOUNTER — Other Ambulatory Visit: Payer: Self-pay

## 2020-11-10 ENCOUNTER — Encounter (HOSPITAL_COMMUNITY): Payer: Self-pay

## 2020-11-10 DIAGNOSIS — R42 Dizziness and giddiness: Secondary | ICD-10-CM | POA: Diagnosis not present

## 2020-11-10 DIAGNOSIS — M545 Low back pain, unspecified: Secondary | ICD-10-CM | POA: Diagnosis not present

## 2020-11-10 NOTE — Therapy (Signed)
Renee Thomas, [place], [address] Phone: [phone]   Fax:  [phone]  Physical Therapy Treatment  Patient Details  Name: Renee Thomas MRN: [id number] Date of Birth: 04-28-1971 Referring Provider (PT): Benjamine Mola Su   Encounter Date: 11/10/2020   PT End of Session - 11/10/20 0910    Visit Number 7    Number of Visits 12    Date for PT Re-Evaluation 12/17/20    Authorization Type BCBS    Authorization - Visit Number 7    Authorization - Number of Visits 30   start over 11/06/20   Progress Note Due on Visit 10    PT Start Time 0900    PT Stop Time 0945    PT Time Calculation (min) 45 min    Activity Tolerance Patient tolerated treatment well    Behavior During Therapy [facility] for tasks assessed/performed           Past Medical History:  Diagnosis Date  . Anemia   . Balance disorder   . GERD (gastroesophageal reflux disease)   . MVP (mitral valve prolapse)   . PVC's (premature ventricular contractions)     History reviewed. No pertinent surgical history.  There were no vitals filed for this visit.   Subjective Assessment - 11/10/20 0908    Subjective Patient reports symptoms are largely unchanged with most noticeable dizziness when twisting right to left in performance of HEP.  Patient reports no LOB and only having transient dizziness spells.    Pertinent History GERD,    Patient Stated Goals to have better balance                             [facility] Adult PT Treatment/Exercise - 11/10/20 0001      Neuro Re-ed    Neuro Re-ed Details  Retrowalking 2x40 ft for challenge to balance and coordination.  Static standing foam pad 3x30 sec eyes open/closed. [place]one taps for dynamic single leg stance 2x10 left/right. Tandem stance on foam 2x30 sec left/right. Forward reach 2x10 with feet elevated into dorsiflexion to facilitate righting reactions. Russian twists with 5 kg 3x10 left/right. Sit to stand with  overhead press with 5 kg ball 2x10 with emphasis on following gaze looking up.                  PT Education - 11/10/20 [phone]    Education Details Education on continued HEP activities and incorporating retrowalking    Person(s) Educated Patient    Methods Explanation;Demonstration    Comprehension Verbalized understanding;Returned demonstration            PT Short Term Goals - 10/27/20 0946      PT SHORT TERM GOAL #1   Title PT to be I in her HEP to decrease her feeling of being off balance to no greater than 5 days per week    Baseline 10/27/20:  Reports compliance with HEP daily, reports 4 days since dizzy spell    Status Achieved      PT SHORT TERM GOAL #2   Title Pt to be able to single leg stance on both LE for at lease 20" to reduce her risk of falls    Status Achieved      PT SHORT TERM GOAL #3   Title PT to be able to come up from a side lying postion without experiencing any dizziness    Baseline  10/27/20:  Reports ability to complete sidelying to sit wihtout dizziness since day after initial eval    Status Achieved             PT Long Term Goals - 10/27/20 0944      PT LONG TERM GOAL #1   Title PT to be to have had no bouts of being off balance for the past week to feel comfortable to go back to driving.    Status Partially Met      PT LONG TERM GOAL #2   Title PT to be able to single leg stance for at least 45 seconds to allow pt to be comfortable going out of her house and walking on her own.    Baseline 12/22:  Reports comfortable walking around the house, haven't attempted outside    Status Partially Met                 Plan - 11/10/20 0949    Clinical Impression Statement Patient progresing well with higher level balance activities and reports decreased intensity of symptoms and reports main onset is when turning from right to left despite gaze stabilization.  Patient reports feeling eye fatigue when performing VOR drills.  Demo improved  postural stability during today's session and less postural sway appreciated on standing on compliant surfaces and with eyes closed.  Patient would benefit from continued tx sessions to continue dynamic balance and vestibular habituation exercises to reduce onset of symptoms    Personal Factors and Comorbidities Comorbidity 2    Comorbidities a-Fib, HTN    Examination-Activity Limitations Bend;Locomotion Level;Other    Examination-Participation Restrictions Cleaning;Driving;Laundry;Meal Prep;Yard Work;Occupation;Shop    Stability/Clinical Decision Making Evolving/Moderate complexity    Rehab Potential Good    PT Frequency 2x / week    PT Duration 6 weeks    PT Treatment/Interventions Vestibular;Balance training;Therapeutic exercise;Therapeutic activities;Manual techniques    PT Next Visit Plan Continue to progress balance and habituation activity as able. Add compliant surface wiht head turns and single leg stand when able. Dyanamic balanace (gait with head turns/ nods) when ready. Continue with [place] twist activities/variations for dynamic challenge    PT Home Exercise Plan habituation sidelying to sittng, eyes to RT and LT,  single leg stance 10/25/20: VOR x 1, standing head nods and turns in NBOS; 12/28 tandem with head turns on pillow 11/04/20: standing VOR. 11/08/20: forward reaches flat ground and with feet dorsiflexed    Consulted and Agree with Plan of Care Patient           Patient will benefit from skilled therapeutic intervention in order to improve the following deficits and impairments:  Decreased activity tolerance,Decreased balance  Visit Diagnosis: Dizziness and giddiness     Problem List Patient Active Problem List   Diagnosis Date Noted  . Primary hypertension 09/10/2020  . GERD without esophagitis 05/02/2019  . BMI 45.0-49.9, adult ([place]) 05/02/2019  . Low serum vitamin B12 11/17/2016     9:55 AM, 11/10/20 M. Sherlyn Lees, PT, DPT Physical Therapist-  [place] Office Number: [phone]  [facility] [address] [place], [place], [address] Phone: [phone]   Fax:  [phone]  Name: Renee Thomas MRN: [id number] Date of Birth: January 21, 1971

## 2020-11-15 ENCOUNTER — Other Ambulatory Visit: Payer: Self-pay

## 2020-11-15 ENCOUNTER — Encounter (HOSPITAL_COMMUNITY): Payer: Self-pay

## 2020-11-15 ENCOUNTER — Ambulatory Visit (HOSPITAL_COMMUNITY): Payer: BC Managed Care – PPO

## 2020-11-15 DIAGNOSIS — M545 Low back pain, unspecified: Secondary | ICD-10-CM

## 2020-11-15 DIAGNOSIS — R42 Dizziness and giddiness: Secondary | ICD-10-CM | POA: Diagnosis not present

## 2020-11-15 NOTE — Therapy (Signed)
[place], [place], [address] Phone: [phone]   Fax:  [phone]  Physical Therapy Treatment  Patient Details  Name: Renee Thomas MRN: [id number] Date of Birth: May 30, 1971 Referring Provider (PT): Benjamine Mola Su   Encounter Date: 11/15/2020   PT End of Session - 11/15/20 0927    Visit Number 8    Number of Visits 12    Date for PT Re-Evaluation 12/17/20    Authorization Type BCBS    Authorization - Visit Number 8    Authorization - Number of Visits 30   start over 11/06/20   Progress Note Due on Visit 10    PT Start Time 0900    PT Stop Time 0945    PT Time Calculation (min) 45 min    Activity Tolerance Patient tolerated treatment well    Behavior During Therapy [facility] for tasks assessed/performed           Past Medical History:  Diagnosis Date  . Anemia   . Balance disorder   . GERD (gastroesophageal reflux disease)   . MVP (mitral valve prolapse)   . PVC's (premature ventricular contractions)     History reviewed. No pertinent surgical history.  There were no vitals filed for this visit.   Subjective Assessment - 11/15/20 0910    Subjective Patient reports marginal improvements in her balance with most notable epiosdes occuring when looking from left to right and when looking up to perform overhead tasks. Patient reports she does not have cardiac f/u until late February and ENT f/u on 12/22/20.    Pertinent History GERD,    Patient Stated Goals to have better balance    Currently in Pain? No/denies                             [facility] Adult PT Treatment/Exercise - 11/15/20 0001      Neuro Re-ed    Neuro Re-ed Details  Retro-walking x 2 min with emphasis on head turns whilst walking to provide postural perturbations. Tandem walking x 40 ft for narrow BOS and postural adjutsment with two episodes of LOB.  Static standing airex pad 3x30 sec eyes open/closed. Russian twists 2x10 reps with 5  kg ball. Playing catch with therapist for hand/eye coordination x 2 min.  Overhead press with single foot elevated using 5 kg ball 2x10 reps for challenge to looking up with narrow BOS.  Self-catch with tennis ball x 2 min for hand-eye coordination.  Walking performing tray balance with ball for challenge to coordination and postural control x 150 ft without dropping ball.Repeated after 5 minutues of moderate cycling to increase challenge by increasing HR.  Single leg stance 3x30 sec left/right and 3x10 sec withstanding postural perturbations for challenge.      Exercises   Exercises Knee/Hip      Knee/Hip Exercises: Aerobic   Stationary Bike 5 min level 3      Knee/Hip Exercises: Standing   Functional Squat 2 sets;10 reps   holding 8 lbs weight to chest                 PT Education - 11/15/20 0926    Education Details Discussion regarding POC details and progress with STG/LTG and discussion regarding CLOF.  Patient reports she is going to initiate driving with supervision this weekend performed on closed street of her neighborhood    [employer]) Educated Patient  Methods Explanation    Comprehension Verbalized understanding            PT Short Term Goals - 10/27/20 0946      PT SHORT TERM GOAL #1   Title PT to be I in her HEP to decrease her feeling of being off balance to no greater than 5 days per week    Baseline 10/27/20:  Reports compliance with HEP daily, reports 4 days since dizzy spell    Status Achieved      PT SHORT TERM GOAL #2   Title Pt to be able to single leg stance on both LE for at lease 20" to reduce her risk of falls    Status Achieved      PT SHORT TERM GOAL #3   Title PT to be able to come up from a side lying postion without experiencing any dizziness    Baseline 10/27/20:  Reports ability to complete sidelying to sit wihtout dizziness since day after initial eval    Status Achieved             PT Long Term Goals - 10/27/20 0944      PT  LONG TERM GOAL #1   Title PT to be to have had no bouts of being off balance for the past week to feel comfortable to go back to driving.    Status Partially Met      PT LONG TERM GOAL #2   Title PT to be able to single leg stance for at least 45 seconds to allow pt to be comfortable going out of her house and walking on her own.    Baseline 12/22:  Reports comfortable walking around the house, haven't attempted outside    Status Partially Met                 Plan - 11/15/20 0927    Clinical Impression Statement Patient is progressing well with POC details and demonstrates main postural disturbance when performing dynamic tasks, especially when performing left to right head/body turns and somewhat when reaching/looking up/overhead with left LOB appreciated.  Patient would benefit from continued vestibular rehab activities to promote habituation and reduce risk for falls.  Will complete Balance assessments at next session.    Personal Factors and Comorbidities Comorbidity 2    Comorbidities a-Fib, HTN    Examination-Activity Limitations Bend;Locomotion Level;Other    Examination-Participation Restrictions Cleaning;Driving;Laundry;Meal Prep;Yard Work;Occupation;Shop    Stability/Clinical Decision Making Evolving/Moderate complexity    Rehab Potential Good    PT Frequency 2x / week    PT Duration 6 weeks    PT Treatment/Interventions Vestibular;Balance training;Therapeutic exercise;Therapeutic activities;Manual techniques    PT Next Visit Plan Continue to progress balance and habituation activity as able. Add compliant surface wiht head turns and single leg stand when able. Dyanamic balanace (gait with head turns/ nods) when ready. Continue with [place] twist activities/variations for dynamic challenge.  BALANCE ASSESSMENT TESTS    PT Home Exercise Plan habituation sidelying to sittng, eyes to RT and LT,  single leg stance 10/25/20: VOR x 1, standing head nods and turns in NBOS; 12/28  tandem with head turns on pillow 11/04/20: standing VOR. 11/08/20: forward reaches flat ground and with feet dorsiflexed. 1/10: weighted sit to stand    Consulted and Agree with Plan of Care Patient           Patient will benefit from skilled therapeutic intervention in order to improve the following deficits and impairments:  Decreased  activity tolerance,Decreased balance  Visit Diagnosis: Dizziness and giddiness  Acute right-sided low back pain, unspecified whether sciatica present     Problem List Patient Active Problem List   Diagnosis Date Noted  . Primary hypertension 09/10/2020  . GERD without esophagitis 05/02/2019  . BMI 45.0-49.9, adult ([place]) 05/02/2019  . Low serum vitamin B12 11/17/2016    9:49 AM, 11/15/20 M. Sherlyn Lees, PT, DPT Physical Therapist- [place] Office Number: [phone]  [facility] [address] [place], [place], [address] Phone: [phone]   Fax:  [phone]  Name: Renee Thomas MRN: [id number] Date of Birth: 04/17/1971

## 2020-11-17 ENCOUNTER — Ambulatory Visit (HOSPITAL_COMMUNITY): Payer: BC Managed Care – PPO

## 2020-11-17 ENCOUNTER — Other Ambulatory Visit: Payer: Self-pay

## 2020-11-17 ENCOUNTER — Encounter (HOSPITAL_COMMUNITY): Payer: Self-pay

## 2020-11-17 DIAGNOSIS — R42 Dizziness and giddiness: Secondary | ICD-10-CM | POA: Diagnosis not present

## 2020-11-17 DIAGNOSIS — M545 Low back pain, unspecified: Secondary | ICD-10-CM

## 2020-11-17 NOTE — Therapy (Signed)
[place], [place], [address] Phone: [phone]   Fax:  [phone]  Physical Therapy Treatment  Patient Details  Name: ANAGHA LOSEKE MRN: [id number] Date of Birth: July 30, 1971 Referring Provider (PT): Benjamine Mola Su   Encounter Date: 11/17/2020   PT End of Session - 11/17/20 0923    Visit Number 9    Number of Visits 12    Date for PT Re-Evaluation 12/17/20    Authorization Type BCBS    Authorization - Visit Number 9    Authorization - Number of Visits 30   start over 11/06/20   Progress Note Due on Visit 10    PT Start Time 0900    PT Stop Time 0944    PT Time Calculation (min) 44 min    Activity Tolerance Patient tolerated treatment well    Behavior During Therapy [facility] for tasks assessed/performed           Past Medical History:  Diagnosis Date  . Anemia   . Balance disorder   . GERD (gastroesophageal reflux disease)   . MVP (mitral valve prolapse)   . PVC's (premature ventricular contractions)     History reviewed. No pertinent surgical history.  There were no vitals filed for this visit.   Subjective Assessment - 11/17/20 0921    Subjective Patient reports continued improvements with decrease in episodes of dizziness when looking up and rotating head side to side    Pertinent History GERD,    Patient Stated Goals to have better balance    Currently in Pain? No/denies              [facility] PT Assessment - 11/17/20 0001      Balance   Balance Assessed Yes      Standardized Balance Assessment   Standardized Balance Assessment Dynamic Gait Index      Dynamic Gait Index   Level Surface Normal    Change in Gait Speed Normal    Gait with Horizontal Head Turns Normal    Gait with Vertical Head Turns Normal    Gait and Pivot Turn Normal    Step Over Obstacle Normal    Step Around Obstacles Normal    Steps Normal    Total Score 24                         [facility] Adult PT  Treatment/Exercise - 11/17/20 0001      Neuro Re-ed    Neuro Re-ed Details  Initiated retrowalking 4x40 ft followed by performance of Dynamic Gait Index.  Modified PNF chops with 5 kg ball with emphasis on following ball with eyes for tracking 2x1 min.  Squat, curl, overhead press with 5 kg 2x1 min. Rocker Board lateral-lateral x 2 min, anterior-posterior x2 min. Tandem stance on foam right/left x 1 min each position. Standing on airex  pad throwing 2 kg 2x10. [place] twists with 2 kg ball with foot elevated for narrow BOS. Sit to stand 2x10 with 8 lbs weight with emphasis on rapid velocity for postural perturbation.                  PT Education - 11/17/20 [phone]    Education Details pt education in performing general conditioning exercises at home to accompany balance activities to improve functional activity tolerance    Person(s) Educated Patient    Methods Explanation    Comprehension Verbalized understanding;Returned demonstration  PT Short Term Goals - 10/27/20 0946      PT SHORT TERM GOAL #1   Title PT to be I in her HEP to decrease her feeling of being off balance to no greater than 5 days per week    Baseline 10/27/20:  Reports compliance with HEP daily, reports 4 days since dizzy spell    Status Achieved      PT SHORT TERM GOAL #2   Title Pt to be able to single leg stance on both LE for at lease 20" to reduce her risk of falls    Status Achieved      PT SHORT TERM GOAL #3   Title PT to be able to come up from a side lying postion without experiencing any dizziness    Baseline 10/27/20:  Reports ability to complete sidelying to sit wihtout dizziness since day after initial eval    Status Achieved             PT Long Term Goals - 10/27/20 0944      PT LONG TERM GOAL #1   Title PT to be to have had no bouts of being off balance for the past week to feel comfortable to go back to driving.    Status Partially Met      PT LONG TERM GOAL #2   Title PT  to be able to single leg stance for at least 45 seconds to allow pt to be comfortable going out of her house and walking on her own.    Baseline 12/22:  Reports comfortable walking around the house, haven't attempted outside    Status Partially Met                 Plan - 11/17/20 0923    Clinical Impression Statement Patient progressing well with POC and demonstrates improved functional performance with decrease in episodes of dizziness and tolerating increased demanding activities with decreased rest periods.  Demonstrates low risk for falls per Dynamic Gait Index score of 24/24. Patient has f/u with MD in near future to assess for return to work readiness.    Personal Factors and Comorbidities Comorbidity 2    Comorbidities a-Fib, HTN    Examination-Activity Limitations Bend;Locomotion Level;Other    Examination-Participation Restrictions Cleaning;Driving;Laundry;Meal Prep;Yard Work;Occupation;Shop    Stability/Clinical Decision Making Evolving/Moderate complexity    Rehab Potential Good    PT Frequency 2x / week    PT Duration 6 weeks    PT Treatment/Interventions Vestibular;Balance training;Therapeutic exercise;Therapeutic activities;Manual techniques    PT Next Visit Plan Continue to progress balance and habituation activity as able. Add compliant surface wiht head turns and single leg stand when able. Dyanamic balanace (gait with head turns/ nods) when ready. Continue with [place] twist activities/variations for dynamic challenge.  BALANCE ASSESSMENT TESTS    PT Home Exercise Plan habituation sidelying to sittng, eyes to RT and LT,  single leg stance 10/25/20: VOR x 1, standing head nods and turns in NBOS; 12/28 tandem with head turns on pillow 11/04/20: standing VOR. 11/08/20: forward reaches flat ground and with feet dorsiflexed. 1/10: weighted sit to stand    Consulted and Agree with Plan of Care Patient           Patient will benefit from skilled therapeutic intervention in  order to improve the following deficits and impairments:  Decreased activity tolerance,Decreased balance  Visit Diagnosis: Dizziness and giddiness  Acute right-sided low back pain, unspecified whether sciatica present     Problem  List Patient Active Problem List   Diagnosis Date Noted  . Primary hypertension 09/10/2020  . GERD without esophagitis 05/02/2019  . BMI 45.0-49.9, adult ([place]) 05/02/2019  . Low serum vitamin B12 11/17/2016    9:45 AM, 11/17/20 M. Sherlyn Lees, PT, DPT Physical Therapist- [place] Office Number: [phone]  [facility] [address] [place], [place], [address] Phone: [phone]   Fax:  [phone]  Name: NERI VIEYRA MRN: [id number] Date of Birth: 22-Oct-1971

## 2020-11-18 NOTE — Telephone Encounter (Signed)
Talked with [employer] last week, forms faxed

## 2020-11-19 ENCOUNTER — Telehealth (HOSPITAL_COMMUNITY): Payer: Self-pay | Admitting: Physical Therapy

## 2020-11-19 NOTE — Telephone Encounter (Signed)
s/w pt  to call our voicemail if you have any concerns if  the office is open or closed on 11/22/20 due to weather

## 2020-11-22 ENCOUNTER — Ambulatory Visit (HOSPITAL_COMMUNITY): Payer: BC Managed Care – PPO | Admitting: Physical Therapy

## 2020-11-24 ENCOUNTER — Ambulatory Visit (HOSPITAL_COMMUNITY): Payer: BC Managed Care – PPO

## 2020-11-24 ENCOUNTER — Other Ambulatory Visit: Payer: Self-pay

## 2020-11-24 ENCOUNTER — Encounter (HOSPITAL_COMMUNITY): Payer: Self-pay

## 2020-11-24 DIAGNOSIS — M545 Low back pain, unspecified: Secondary | ICD-10-CM | POA: Diagnosis not present

## 2020-11-24 DIAGNOSIS — R42 Dizziness and giddiness: Secondary | ICD-10-CM

## 2020-11-24 NOTE — Therapy (Signed)
[facility] [address] [place], [place], [address] Phone: [phone]   Fax:  [phone]  Physical Therapy Treatment and Progress Note  Patient Details  Name: Renee Thomas MRN: [id number] Date of Birth: [birth year] Referring Provider (PT): Benjamine Mola Su  Progress Note Reporting Period 10/22/20 to 11/24/20  See note below for Objective Data and Assessment of Progress/Goals.   Encounter Date: 11/24/2020   PT End of Session - 11/24/20 0902    Visit Number 10    Number of Visits 12    Date for PT Re-Evaluation 12/17/20    Authorization Type BCBS    Authorization - Visit Number 10    Authorization - Number of Visits 30   start over 11/06/20   Progress Note Due on Visit 20    PT Start Time 0900    PT Stop Time 0945    PT Time Calculation (min) 45 min    Activity Tolerance Patient tolerated treatment well    Behavior During Therapy [facility] for tasks assessed/performed           Past Medical History:  Diagnosis Date  . Anemia   . Balance disorder   . GERD (gastroesophageal reflux disease)   . MVP (mitral valve prolapse)   . PVC's (premature ventricular contractions)       History reviewed. No pertinent surgical history.  There were no vitals filed for this visit.   Subjective Assessment - 11/24/20 0900    Subjective Patient reports several episodes of dizziness at home lasting 15 min to 1 hr duration and notes episodes occured when she was drinking from a bottle and again later when she was looking down at her tablet later in the evening.  Denies any symptoms when transitioning from supine to sitting    Pertinent History GERD,    Patient Stated Goals to have better balance              [facility] PT Assessment - 11/24/20 0001      Assessment   Medical Diagnosis vestibular dysfunction    Referring Provider (PT) Benjamine Mola Su      Special Tests   Other special tests No nystagmus noted with horizontal canal provocation testing                          [facility] Adult PT Treatment/Exercise - 11/24/20 0001      Neuro Re-ed    Neuro Re-ed Details  retrowalking 2x40 ft to facilitate hip extension and single limb loading for balance correction. SLS in parallel bars with goal to reach 45 seconds performed 4x right/left LE and demo 100% success. Foot lift-offs from 6" step with alternate knee tap to promote single leg stance with crossing mid line. Static paloff press whilst standing on airex pad 2x1 min left/right using blue t-band for anti-rotation and bracing abdomen. Walking and performing throw and catch with tennis ball for hand-eye coordination challenge x 225 ft. Bounce pass x 2 min for coordination with emphasis on changing hands.  Russian twists in parallel bars 10x right/left with 5 kg ball. Sit-stand with overhead press 5 kgs ball 3x10 with emphasis on following gaze upwards for VOR habituation. VOR challenge by reading text with large print from 6 ft distance with head maintained in static right/left rotation, flexed/extended, then performing dynamic head movements whilst reading texts.                  PT  Education - 11/24/20 0954    Education Details pt educated on CLOF as it pertains to [place] details and progress with STG/LTG and analysis of current symptoms.  Discussion regarding vestibular anatomy and explanation of peripheral vs central lesions as it pertains to balance/equilibrium.    Person(s) Educated Patient    Methods Explanation;Handout    Comprehension Verbalized understanding            PT Short Term Goals - 10/27/20 0946      PT SHORT TERM GOAL #1   Title PT to be I in her HEP to decrease her feeling of being off balance to no greater than 5 days per week    Baseline 10/27/20:  Reports compliance with HEP daily, reports 4 days since dizzy spell    Status Achieved      PT SHORT TERM GOAL #2   Title Pt to be able to single leg stance on both LE for at lease 20" to reduce her risk of  falls    Status Achieved      PT SHORT TERM GOAL #3   Title PT to be able to come up from a side lying postion without experiencing any dizziness    Baseline 10/27/20:  Reports ability to complete sidelying to sit wihtout dizziness since day after initial eval    Status Achieved             PT Long Term Goals - 11/24/20 0906      PT LONG TERM GOAL #1   Title PT to be to have had no bouts of being off balance for the past week to feel comfortable to go back to driving.    Baseline Patient has been unable to attempt due to episodes of dizziness and now recently hazardous weather has prevented trial    Time 2    Period Weeks    Status Partially Met      PT LONG TERM GOAL #2   Title PT to be able to single leg stance for at least 45 seconds to allow pt to be comfortable going out of her house and walking on her own.    Baseline able to perform 45 sec right/left without LOB    Status Achieved    Target Date 11/24/20                 Plan - 11/24/20 5701    Clinical Impression Statement Patient demonstrates global improvements throughout POC and has met all STG at this time and additional 1/2 LTG presently.  Patient self-reports overall improvements in her balance and functional status with episodes of dizziness being more sporadic and isolated to instances of head/neck rotation from left to right and when looking up.    Personal Factors and Comorbidities Comorbidity 2    Comorbidities a-Fib, HTN    Examination-Activity Limitations Bend;Locomotion Level;Other    Examination-Participation Restrictions Cleaning;Driving;Laundry;Meal Prep;Yard Work;Occupation;Shop    Stability/Clinical Decision Making Evolving/Moderate complexity    Rehab Potential Good    PT Frequency 2x / week    PT Duration 6 weeks    PT Treatment/Interventions Vestibular;Balance training;Therapeutic exercise;Therapeutic activities;Manual techniques    PT Next Visit Plan Continue to progress balance and  habituation activity as able. Add compliant surface wiht head turns and single leg stand when able. Dyanamic balanace (gait with head turns/ nods) when ready. Continue with [place] twist activities/variations for dynamic challenge.  BALANCE ASSESSMENT TESTS    PT Home Exercise Plan habituation sidelying to sittng,  eyes to RT and LT,  single leg stance 10/25/20: VOR x 1, standing head nods and turns in NBOS; 12/28 tandem with head turns on pillow 11/04/20: standing VOR. 11/08/20: forward reaches flat ground and with feet dorsiflexed. 1/10: weighted sit to stand    Consulted and Agree with Plan of Care Patient           Patient will benefit from skilled therapeutic intervention in order to improve the following deficits and impairments:  Decreased activity tolerance,Decreased balance  Visit Diagnosis: Dizziness and giddiness     Problem List Patient Active Problem List   Diagnosis Date Noted  . Primary hypertension 09/10/2020  . GERD without esophagitis 05/02/2019  . BMI 45.0-49.9, adult ([place]) 05/02/2019  . Low serum vitamin B12 11/17/2016    10:04 AM, 11/24/20 M. Sherlyn Lees, PT, DPT Physical Therapist- [place] Office Number: [phone]  [facility] [address] [place], [place], [address] Phone: [phone]   Fax:  [phone]  Name: Renee Thomas MRN: [id number] Date of Birth: 1971-09-20

## 2020-11-25 ENCOUNTER — Ambulatory Visit (INDEPENDENT_AMBULATORY_CARE_PROVIDER_SITE_OTHER): Payer: BC Managed Care – PPO

## 2020-11-25 DIAGNOSIS — E538 Deficiency of other specified B group vitamins: Secondary | ICD-10-CM | POA: Diagnosis not present

## 2020-11-25 MED ORDER — METOPROLOL TARTRATE 25 MG PO TABS: 12.5000 mg | ORAL_TABLET | Freq: Two times a day (BID) | ORAL | 3 refills | Status: DC | PRN

## 2020-11-25 NOTE — Telephone Encounter (Signed)
The monitor showed you had 2 brief episodes of supraventricular tachycardia. One lasting only 5 beats and the other lasting 6 beats. You did have some fairly frequent isolated PVCs (skipped heart beats) during that time. We could try low-dose metoprolol 12.5 mg as needed twice daily if she would like to try to help with your symptoms of palpitations. Let us know and we can send this to your pharmacy of choice.   Patient informed and verbalized understanding of plan.

## 2020-11-29 ENCOUNTER — Other Ambulatory Visit: Payer: Self-pay

## 2020-11-29 ENCOUNTER — Ambulatory Visit (HOSPITAL_COMMUNITY): Payer: BC Managed Care – PPO

## 2020-11-29 ENCOUNTER — Encounter (HOSPITAL_COMMUNITY): Payer: Self-pay

## 2020-11-29 DIAGNOSIS — R42 Dizziness and giddiness: Secondary | ICD-10-CM | POA: Diagnosis not present

## 2020-11-29 DIAGNOSIS — M545 Low back pain, unspecified: Secondary | ICD-10-CM | POA: Diagnosis not present

## 2020-11-29 NOTE — Therapy (Signed)
[place], [place], [address] Phone: [phone]   Fax:  [phone]  Physical Therapy Treatment  Patient Details  Name: Renee Thomas MRN: [id number] Date of Birth: 02/19/1971 Referring Provider (PT): Benjamine Mola Su   Encounter Date: 11/29/2020   PT End of Session - 11/29/20 0908    Visit Number 11    Number of Visits 12    Date for PT Re-Evaluation 12/17/20    Authorization Type BCBS    Authorization - Visit Number 11    Authorization - Number of Visits 30   start over 11/06/20   Progress Note Due on Visit 20    PT Start Time 0900    PT Stop Time 0945    PT Time Calculation (min) 45 min    Activity Tolerance Patient tolerated treatment well    Behavior During Therapy [facility] for tasks assessed/performed           Past Medical History:  Diagnosis Date  . Anemia   . Balance disorder   . GERD (gastroesophageal reflux disease)   . MVP (mitral valve prolapse)   . PVC's (premature ventricular contractions)     History reviewed. No pertinent surgical history.  There were no vitals filed for this visit.   Subjective Assessment - 11/29/20 0906    Subjective Patient reports occasional dizziness and she has been checking her BP when these events occur and reports vital signs WNL.  Reports random occurrences with dizziness and feeling lightheaded    Pertinent History GERD,    Patient Stated Goals to have better balance              [facility] PT Assessment - 11/29/20 0001      Assessment   Medical Diagnosis vestibular dysfunction    Referring Provider (PT) Leta Baptist                         [facility] Adult PT Treatment/Exercise - 11/29/20 0001      Neuro Re-ed    Neuro Re-ed Details  retrowalking 4x40 ft with emphasis on head turns for balance correction               Balance Exercises - 11/29/20 0001      Balance Exercises: Standing   Standing Eyes Opened Head turns;Narrow base of support (BOS)     Standing Eyes Closed Foam/compliant surface    Tandem Stance Foam/compliant surface;4 reps;30 secs    SLS Eyes open;Solid surface;4 reps;Foam/compliant surface;20 secs    Standing, One Foot on a Step Eyes open;Foam/compliant surface;6 inch   withstanding postural perturbations 2x1 min   Rockerboard Anterior/posterior;Lateral;Head turns;EO;EC;10 seconds   x 2 min trials alternating tasks   Gait with Head Turns Forward   holding 10 lbs in right hand x 250 ft, left hand 250 ft   Sidestepping Upper extremity support;2 reps   2 min   Lift / Chop 20 reps;Both   5 kg med ball   Other Standing Exercises standing on foam perofrming VOR x 1    Other Standing Exercises Comments single foot elevated on dynadisc+rockerboard 2x1 min             PT Education - 11/29/20 0952    Education Details pt education on preparation for D/C from PT POC    Person(s) Educated Patient    Methods Explanation    Comprehension Verbalized understanding  PT Short Term Goals - 10/27/20 0946      PT SHORT TERM GOAL #1   Title PT to be I in her HEP to decrease her feeling of being off balance to no greater than 5 days per week    Baseline 10/27/20:  Reports compliance with HEP daily, reports 4 days since dizzy spell    Status Achieved      PT SHORT TERM GOAL #2   Title Pt to be able to single leg stance on both LE for at lease 20" to reduce her risk of falls    Status Achieved      PT SHORT TERM GOAL #3   Title PT to be able to come up from a side lying postion without experiencing any dizziness    Baseline 10/27/20:  Reports ability to complete sidelying to sit wihtout dizziness since day after initial eval    Status Achieved             PT Long Term Goals - 11/24/20 0906      PT LONG TERM GOAL #1   Title PT to be to have had no bouts of being off balance for the past week to feel comfortable to go back to driving.    Baseline Patient has been unable to attempt due to episodes of  dizziness and now recently hazardous weather has prevented trial    Time 2    Period Weeks    Status Partially Met      PT LONG TERM GOAL #2   Title PT to be able to single leg stance for at least 45 seconds to allow pt to be comfortable going out of her house and walking on her own.    Baseline able to perform 45 sec right/left without LOB    Status Achieved    Target Date 11/24/20                 Plan - 11/29/20 0941    Clinical Impression Statement Continues to demo improved balance status as evidenced by higher level dynamic balance activities without LOB and withstanding moderate postural perturbations.  Main issue is manifest when performing activities and performing head turn from right to left with BOS disturbance noted. D/C after next visit    Personal Factors and Comorbidities Comorbidity 2    Comorbidities a-Fib, HTN    Examination-Activity Limitations Bend;Locomotion Level;Other    Examination-Participation Restrictions Cleaning;Driving;Laundry;Meal Prep;Yard Work;Occupation;Shop    Stability/Clinical Decision Making Evolving/Moderate complexity    Rehab Potential Good    PT Frequency 2x / week    PT Duration 6 weeks    PT Treatment/Interventions Vestibular;Balance training;Therapeutic exercise;Therapeutic activities;Manual techniques    PT Next Visit Plan Continue to progress balance and habituation activity as able. Add compliant surface wiht head turns and single leg stand when able. Dyanamic balanace (gait with head turns/ nods) when ready. Continue with [place] twist activities/variations for dynamic challenge.  BALANCE ASSESSMENT TESTS. Review POC and D/C to HEP    PT Home Exercise Plan habituation sidelying to sittng, eyes to RT and LT,  single leg stance 10/25/20: VOR x 1, standing head nods and turns in NBOS; 12/28 tandem with head turns on pillow 11/04/20: standing VOR. 11/08/20: forward reaches flat ground and with feet dorsiflexed. 1/10: weighted sit to stand.  1/24 single leg balance with contralateral bicep curl    Consulted and Agree with Plan of Care Patient           Patient  will benefit from skilled therapeutic intervention in order to improve the following deficits and impairments:  Decreased activity tolerance,Decreased balance  Visit Diagnosis: Dizziness and giddiness     Problem List Patient Active Problem List   Diagnosis Date Noted  . Primary hypertension 09/10/2020  . GERD without esophagitis 05/02/2019  . BMI 45.0-49.9, adult ([place]) 05/02/2019  . Low serum vitamin B12 11/17/2016   9:56 AM, 11/29/20 M. Sherlyn Lees, PT, DPT Physical Therapist- [place] Office Number: [phone]  [place] [address] [place], [place], [address] Phone: [phone]   Fax:  [phone]  Name: Renee Thomas MRN: [id number] Date of Birth: 07-08-1971

## 2020-12-01 ENCOUNTER — Ambulatory Visit (HOSPITAL_COMMUNITY): Payer: BC Managed Care – PPO | Admitting: Physical Therapy

## 2020-12-01 ENCOUNTER — Encounter (HOSPITAL_COMMUNITY): Payer: Self-pay | Admitting: Physical Therapy

## 2020-12-01 ENCOUNTER — Other Ambulatory Visit: Payer: Self-pay

## 2020-12-01 DIAGNOSIS — M545 Low back pain, unspecified: Secondary | ICD-10-CM | POA: Diagnosis not present

## 2020-12-01 DIAGNOSIS — R42 Dizziness and giddiness: Secondary | ICD-10-CM

## 2020-12-01 NOTE — Therapy (Signed)
[place], [place], [address] Phone: [phone]   Fax:  [phone]  Physical Therapy Treatment  Patient Details  Name: Renee Thomas MRN: [id number] Date of Birth: 12/01/1970 Referring Provider (PT): Benjamine Mola Su  PHYSICAL THERAPY DISCHARGE SUMMARY  Visits from Start of Care: 12  Current functional level related to goals / functional outcomes: Balance is now normal    Remaining deficits: Dizziness has improved but continues about 2 x a week    Education / Equipment: HEP for vestibular given  Plan: Patient agrees to discharge.  Patient goals were met. Patient is being discharged due to financial reasons.  ?????    All goals met  except one LTG which is partially met.  Encounter Date: 12/01/2020   PT End of Session - 12/01/20 0926    Visit Number 12    Number of Visits 12    Date for PT Re-Evaluation 12/17/20    Authorization Type BCBS    Authorization - Visit Number 12    Authorization - Number of Visits 30   start over 11/06/20   Progress Note Due on Visit 20    PT Start Time 0900    PT Stop Time 0930    PT Time Calculation (min) 30 min    Activity Tolerance Patient tolerated treatment well    Behavior During Therapy [facility] for tasks assessed/performed           Past Medical History:  Diagnosis Date  . Anemia   . Balance disorder   . GERD (gastroesophageal reflux disease)   . MVP (mitral valve prolapse)   . PVC's (premature ventricular contractions)     History reviewed. No pertinent surgical history.  There were no vitals filed for this visit.   Subjective Assessment - 12/01/20 0905    Subjective Patient reports that she is doing much better than when she first came to therapy she feels that she is about 80% better. She has a heaviness that comes over her head and a tingling sensation that comes over her which is so distracting that she can not do much; this is occuring about 2 x a week.     Pertinent History GERD,    How long can you sit comfortably? no problem    How long can you stand comfortably? no problem    How long can you walk comfortably? no problem    Patient Stated Goals to have better balance    Currently in Pain? Yes              [facility] PT Assessment - 12/01/20 0001      Assessment   Medical Diagnosis vestibular dysfunction    Referring Provider (PT) Benjamine Mola Su    Onset Date/Surgical Date 06/06/20   acute exacerbation   Next MD Visit 12/16/2020    Prior Therapy not for this episode      Precautions   Precautions Fall   PT moves very slow due to fear of falling     Restrictions   Weight Bearing Restrictions No      Prior Function   Level of Independence Independent      Cognition   Overall Cognitive Status Within Functional Limits for tasks assessed      Functional Tests   Functional tests Single leg stance;Sit to Stand      Single Leg Stance   Comments LT 60" was 15" ; Rt:60" was  4  Sit to Stand   Comments 10 was 8 in 30 seconds no dizziness               Vestibular Assessment - 12/01/20 0001      Oculomotor Exam   Ocular ROM normal    Smooth Pursuits Saccades    Saccades Poor trajectory      Oculomotor Exam-Fixation Suppressed    Head Shaking Nystagmus-Horizontal no sx eval pt had slight sx      Positional Sensitivities   Right Hallpike No dizziness    Up from Right Hallpike --   very slight light headedness was moderate dizziness.   Up from Left Hallpike Lightheadedness   was moderate   Nose to Right Knee No dizziness   very slight light headedness   Nose to Left Knee No dizziness   very slight when she comes back up.   Head Turning x 5 No dizziness    Head Nodding x 5 No dizziness                              PT Short Term Goals - 12/01/20 7893      PT SHORT TERM GOAL #1   Title PT to be I in her HEP to decrease her feeling of being off balance to no greater than 5 days per week    Baseline  10/27/20:  Reports compliance with HEP daily, reports 4 days since dizzy spell    Status Achieved      PT SHORT TERM GOAL #2   Title Pt to be able to single leg stance on both LE for at lease 20" to reduce her risk of falls    Status Achieved      PT SHORT TERM GOAL #3   Title PT to be able to come up from a side lying postion without experiencing any dizziness    Baseline 10/27/20:  Reports ability to complete sidelying to sit wihtout dizziness since day after initial eval    Status Achieved             PT Long Term Goals - 12/01/20 0927      PT LONG TERM GOAL #1   Title PT to be to have had no bouts of being off balance for the past week to feel comfortable to go back to driving.    Baseline Patient has been unable to attempt due to episodes of dizziness and now recently hazardous weather has prevented trial    Time 2    Period Weeks    Status Partially Met      PT LONG TERM GOAL #2   Title PT to be able to single leg stance for at least 45 seconds to allow pt to be comfortable going out of her house and walking on her own.    Baseline able to perform 45 sec right/left without LOB    Status Achieved                 Plan - 12/01/20 0932    Clinical Impression Statement Pt reassessed with normal balance but continues to have vestibular sx.  Pt admits to only completing vestibular exercises 2x a week as she has been forcusing on daily balance exercises.  Therapist explained that vestibular exercisee need to be completed 4-6 x a day, but now that her balance is normal she can stop completing her balance exercises and concentrate on her vestibular exercises.  PT verbalized understanding.    Personal Factors and Comorbidities Comorbidity 2    Comorbidities a-Fib, HTN    Examination-Activity Limitations Bend;Locomotion Level;Other    Examination-Participation Restrictions Cleaning;Driving;Laundry;Meal Prep;Yard Work;Occupation;Shop    Stability/Clinical Decision Making  Evolving/Moderate complexity    Rehab Potential Good    PT Frequency 2x / week    PT Duration 6 weeks    PT Treatment/Interventions Vestibular;Balance training;Therapeutic exercise;Therapeutic activities;Manual techniques    PT Next Visit Plan Discharge pt to HEP    PT Home Exercise Plan habituation sidelying to sittng, eyes to RT and LT,  single leg stance 10/25/20: VOR x 1, standing head nods and turns in NBOS; 12/28 tandem with head turns on pillow 11/04/20: standing VOR. 11/08/20: forward reaches flat ground and with feet dorsiflexed. 1/10: weighted sit to stand. 1/24 single leg balance with contralateral bicep curl    Consulted and Agree with Plan of Care Patient           Patient will benefit from skilled therapeutic intervention in order to improve the following deficits and impairments:  Decreased activity tolerance,Decreased balance  Visit Diagnosis: Acute right-sided low back pain, unspecified whether sciatica present  Dizziness and giddiness     Problem List Patient Active Problem List   Diagnosis Date Noted  . Primary hypertension 09/10/2020  . GERD without esophagitis 05/02/2019  . BMI 45.0-49.9, adult ([place]) 05/02/2019  . Low serum vitamin B12 11/17/2016   Rayetta Humphrey, PT CLT [phone] 12/01/2020, 9:35 AM  [place] [address] [place], [place], [address] Phone: [phone]   Fax:  [phone]  Name: Renee Thomas MRN: [id number] Date of Birth: 10/20/1971

## 2020-12-02 ENCOUNTER — Encounter: Payer: Self-pay | Admitting: Nurse Practitioner

## 2020-12-02 ENCOUNTER — Ambulatory Visit: Payer: BC Managed Care – PPO | Admitting: Nurse Practitioner

## 2020-12-02 VITALS — BP 125/74 | HR 72 | Temp 97.4°F | Resp 20 | Ht 65.0 in | Wt 285.0 lb

## 2020-12-02 DIAGNOSIS — M25512 Pain in left shoulder: Secondary | ICD-10-CM

## 2020-12-02 MED ORDER — KETOROLAC TROMETHAMINE 60 MG/2ML IM SOLN
60.0000 mg | Freq: Once | INTRAMUSCULAR | Status: AC
  Administered 2020-12-02: 60 mg via INTRAMUSCULAR

## 2020-12-02 MED ORDER — NAPROXEN 500 MG PO TABS: 500.0000 mg | ORAL_TABLET | Freq: Two times a day (BID) | ORAL | 1 refills | Status: DC

## 2020-12-02 NOTE — Progress Notes (Signed)
   Subjective:    Patient ID: Renee Thomas, female    DOB: 1970-12-08, 50 y.o.   MRN: [id number]   Chief Complaint: Shoulder Pain (Left. No injury/)   HPI Patient comes in today c/o left shoulder pain. Started about 2 weeks ago. Doing anything that she has to raise her arm up over her head it hurts. Has a place that is sore to touch. She rates pain 0/10 when she is sitting still and is a 8-10/10 when she moves her arm.  Review of Systems  Constitutional: Negative for diaphoresis.  Eyes: Negative for pain.  Respiratory: Negative for shortness of breath.   Cardiovascular: Negative for chest pain, palpitations and leg swelling.  Gastrointestinal: Negative for abdominal pain.  Endocrine: Negative for polydipsia.  Musculoskeletal: Positive for arthralgias (left shoulder pain).  Skin: Negative for rash.  Neurological: Negative for dizziness, weakness and headaches.  Hematological: Does not bruise/bleed easily.  All other systems reviewed and are negative.      Objective:   Physical Exam Vitals and nursing note reviewed.  Constitutional:      Appearance: Normal appearance.  Cardiovascular:     Rate and Rhythm: Normal rate and regular rhythm.     Heart sounds: Normal heart sounds.  Pulmonary:     Breath sounds: Normal breath sounds.  Musculoskeletal:     Comments: Point tenderness on lateral side of shoulder. Pain with ext, internal rotation and abduction of right shoulder. Grips equal bil.  Neurological:     General: No focal deficit present.     Mental Status: She is alert and oriented to person, place, and time.  Psychiatric:        Mood and Affect: Mood normal.        Behavior: Behavior normal.    BP 125/74   Pulse 72   Temp (!) 97.4 F (36.3 C) (Temporal)   Resp 20   Ht 5\' 5"  (1.651 m)   Wt 285 lb (129.3 kg)   SpO2 100%   BMI 47.43 kg/m        Assessment & Plan:  Renee Thomas comes in today with chief complaint of Shoulder Pain (Left. No  injury/)   Diagnosis and orders addressed:  1. Acute pain of left shoulder Moist heat rest - ketorolac (TORADOL) injection 60 mg - naproxen (NAPROSYN) 500 MG tablet; Take 1 tablet (500 mg total) by mouth 2 (two) times daily with a meal.  Dispense: 60 tablet; Refill: 1    Follow up plan: prn   [name] [name], FNP

## 2020-12-02 NOTE — Patient Instructions (Signed)
Shoulder Pain Many things can cause shoulder pain, including:  An injury.  Moving the shoulder in the same way again and again (overuse).  Joint pain (arthritis). Pain can come from:  Swelling and irritation (inflammation) of any part of the shoulder.  An injury to the shoulder joint.  An injury to: ? Tissues that connect muscle to bone (tendons). ? Tissues that connect bones to each other (ligaments). ? Bones. Follow these instructions at home: Watch for changes in your symptoms. Let your doctor know about them. Follow these instructions to help with your pain. If you have a sling:  Wear the sling as told by your doctor. Remove it only as told by your doctor.  Loosen the sling if your fingers: ? Tingle. ? Become numb. ? Turn cold and blue.  Keep the sling clean.  If the sling is not waterproof: ? Do not let it get wet. ? Take the sling off when you shower or bathe. Managing pain, stiffness, and swelling  If told, put ice on the painful area: ? Put ice in a plastic bag. ? Place a towel between your skin and the bag. ? Leave the ice on for 20 minutes, 2-3 times a day. Stop putting ice on if it does not help with the pain.  Squeeze a soft ball or a foam pad as much as possible. This prevents swelling in the shoulder. It also helps to strengthen the arm.   General instructions  Take over-the-counter and prescription medicines only as told by your doctor.  Keep all follow-up visits as told by your doctor. This is important. Contact a doctor if:  Your pain gets worse.  Medicine does not help your pain.  You have new pain in your arm, hand, or fingers. Get help right away if:  Your arm, hand, or fingers: ? Tingle. ? Are numb. ? Are swollen. ? Are painful. ? Turn white or blue. Summary  Shoulder pain can be caused by many things. These include injury, moving the shoulder in the same away again and again, and joint pain.  Watch for changes in your symptoms.  Let your doctor know about them.  This condition may be treated with a sling, ice, and pain medicine.  Contact your doctor if the pain gets worse or you have new pain. Get help right away if your arm, hand, or fingers tingle or get numb, swollen, or painful.  Keep all follow-up visits as told by your doctor. This is important. This information is not intended to replace advice given to you by your health care provider. Make sure you discuss any questions you have with your health care provider. Document Revised: 05/07/2018 Document Reviewed: 05/07/2018 Elsevier Patient Education  2021 Elsevier Inc.  

## 2020-12-06 ENCOUNTER — Encounter (HOSPITAL_COMMUNITY): Payer: BC Managed Care – PPO

## 2020-12-13 ENCOUNTER — Telehealth: Payer: Self-pay | Admitting: Family Medicine

## 2020-12-13 NOTE — Telephone Encounter (Signed)
Forms from patient received on 12/08/2020. Completed patient authorization attached. Placed form with nursing staff.   HEP 12/08/2020

## 2020-12-22 ENCOUNTER — Telehealth: Payer: Self-pay | Admitting: *Deleted

## 2020-12-22 DIAGNOSIS — R0982 Postnasal drip: Secondary | ICD-10-CM | POA: Diagnosis not present

## 2020-12-22 DIAGNOSIS — J310 Chronic rhinitis: Secondary | ICD-10-CM | POA: Diagnosis not present

## 2020-12-22 DIAGNOSIS — R42 Dizziness and giddiness: Secondary | ICD-10-CM | POA: Diagnosis not present

## 2020-12-22 DIAGNOSIS — H832X3 Labyrinthine dysfunction, bilateral: Secondary | ICD-10-CM | POA: Diagnosis not present

## 2020-12-22 DIAGNOSIS — J343 Hypertrophy of nasal turbinates: Secondary | ICD-10-CM | POA: Diagnosis not present

## 2020-12-22 NOTE — Telephone Encounter (Signed)
No success reaching Renee Thomas at [employer] to get clarity of why forms were needed to be filled out since our office did not take her out of work.

## 2020-12-22 NOTE — Telephone Encounter (Signed)
Multiple attempts made to [employer] for clarification on what was needed since records received from our office already on 12/09/2020 and confirmed on 12/13/2020 with agent there.

## 2020-12-27 ENCOUNTER — Other Ambulatory Visit: Payer: Self-pay

## 2020-12-27 ENCOUNTER — Ambulatory Visit (INDEPENDENT_AMBULATORY_CARE_PROVIDER_SITE_OTHER): Payer: BC Managed Care – PPO

## 2020-12-27 DIAGNOSIS — E538 Deficiency of other specified B group vitamins: Secondary | ICD-10-CM

## 2020-12-27 NOTE — Progress Notes (Signed)
B12 injection given and tolerated well.  

## 2020-12-29 ENCOUNTER — Ambulatory Visit: Payer: BC Managed Care – PPO | Admitting: Cardiology

## 2020-12-29 ENCOUNTER — Encounter: Payer: Self-pay | Admitting: Cardiology

## 2020-12-29 VITALS — BP 144/86 | HR 66 | Ht 65.0 in | Wt 288.2 lb

## 2020-12-29 DIAGNOSIS — I493 Ventricular premature depolarization: Secondary | ICD-10-CM

## 2020-12-29 DIAGNOSIS — I10 Essential (primary) hypertension: Secondary | ICD-10-CM | POA: Diagnosis not present

## 2020-12-29 DIAGNOSIS — R079 Chest pain, unspecified: Secondary | ICD-10-CM | POA: Diagnosis not present

## 2020-12-29 DIAGNOSIS — R42 Dizziness and giddiness: Secondary | ICD-10-CM | POA: Diagnosis not present

## 2020-12-29 NOTE — Progress Notes (Signed)
Clinical Summary Ms. Dunsmore is a 50 y.o.female  1. Dizziness - recent ER visit with dizziness. Overall negative workup. Orthostatics were negative - no recurrent episodes.   - can come on with reading or watching tv, turning her head certain ways. Bad episodes can last several hours.   2. PVCs - occasional palpitaitons, mainly with laying down to go to sleep. Fairly mild. Can occur with stress - no coffee, occasional tea, 1 soda a nigh MTN Dew, no EtoH - normal K, Mg, normal TSH - over the last month can have some postional back and chest pain. Heaviest activity is moving heavy boxes at work with some mild fatigue.  - previous monitor in 2003/2004.    10/2020 nuclear stress no ischemia 10/2020 echo :LVEF 55-60% Jan 2022 14 day monitor: 6.3% PVCs, rare short runs of SVT  - symptoms are improved  3. Chest pressure/DOE - 10/2020 nuclear stress no ischemia 10/2020 echo :LVEF 55-60%  - symptoms have improved    4. HTN - has not meds yet - working to cut back on sodium intake - at recent pcp visit 125/74 - home bp's 120s/70s Past Medical History:  Diagnosis Date  . Anemia   . Balance disorder   . GERD (gastroesophageal reflux disease)   . MVP (mitral valve prolapse)   . PVC's (premature ventricular contractions)      Allergies  Allergen Reactions  . Other Anaphylaxis    All Tree-Nuts  . Peanut-Containing Drug Products Anaphylaxis  . Eggs Or Egg-Derived Products     Swells mouth with itching  . Fluarix [Influenza Virus Vaccine]     Contains egg protein.  Marland Kitchen Amoxicillin Rash  . Penicillins Rash    .Has patient had a PCN reaction causing immediate rash, facial/tongue/throat swelling, SOB or lightheadedness with hypotension: Yes Has patient had a PCN reaction causing severe rash involving mucus membranes or skin necrosis: No Has patient had a PCN reaction that required hospitalization: no Has patient had a PCN reaction occurring within the last 10 years: no If  all of the above answers are "NO", then may proceed with Cephalosporin use.   . Sulfa Antibiotics Rash    numbness     Current Outpatient Medications  Medication Sig Dispense Refill  . amLODipine (NORVASC) 2.5 MG tablet Take 1 tablet (2.5 mg total) by mouth daily. 90 tablet 1  . Carboxymethylcellul-Glycerin (CLEAR EYES FOR DRY EYES OP) Place 1-2 drops into both eyes daily as needed.    . Cholecalciferol (VITAMIN D PO) Take by mouth daily.    . cyanocobalamin (,VITAMIN B-12,) 1000 MCG/ML injection Inject 1,000 mcg into the muscle every 30 (thirty) days.    . fexofenadine (ALLEGRA ALLERGY) 180 MG tablet Take 1 tablet (180 mg total) by mouth daily. 90 tablet 1  . fluticasone (FLONASE) 50 MCG/ACT nasal spray SPRAY 2 SPRAYS INTO EACH NOSTRIL EVERY DAY 48 mL 1  . hydrochlorothiazide (HYDRODIURIL) 25 MG tablet Take 1 tablet (25 mg total) by mouth daily. 90 tablet 3  . KRILL OIL PO Take 350 mg by mouth daily.     . metoprolol tartrate (LOPRESSOR) 25 MG tablet Take 0.5 tablets (12.5 mg total) by mouth 2 (two) times daily as needed (palpitations). 60 tablet 3  . naproxen (NAPROSYN) 500 MG tablet Take 1 tablet (500 mg total) by mouth 2 (two) times daily with a meal. 60 tablet 1  . omeprazole (PRILOSEC) 20 MG capsule TAKE 1 CAPSULE BY MOUTH EVERY DAY 90 capsule 1  .  Probiotic Product (TRUBIOTICS PO) Take by mouth daily.     Current Facility-Administered Medications  Medication Dose Route Frequency Provider Last Rate Last Admin  . cyanocobalamin ((VITAMIN B-12)) injection 1,000 mcg  1,000 mcg Intramuscular Q30 days Bennie Pierini, FNP   1,000 mcg at 12/27/20 9563     No past surgical history on file.   Allergies  Allergen Reactions  . Other Anaphylaxis    All Tree-Nuts  . Peanut-Containing Drug Products Anaphylaxis  . Eggs Or Egg-Derived Products     Swells mouth with itching  . Fluarix [Influenza Virus Vaccine]     Contains egg protein.  Marland Kitchen Amoxicillin Rash  . Penicillins Rash     .Has patient had a PCN reaction causing immediate rash, facial/tongue/throat swelling, SOB or lightheadedness with hypotension: Yes Has patient had a PCN reaction causing severe rash involving mucus membranes or skin necrosis: No Has patient had a PCN reaction that required hospitalization: no Has patient had a PCN reaction occurring within the last 10 years: no If all of the above answers are "NO", then may proceed with Cephalosporin use.   . Sulfa Antibiotics Rash    numbness      Family History  Problem Relation Age of Onset  . Hypertension Mother   . Diabetes Mother   . Other Mother        renal disease  . Hypertension Father   . Heart attack Father   . Hypertension Sister   . Diabetes Brother   . Diabetes Brother      Social History Ms. Mijangos reports that she has never smoked. She has never used smokeless tobacco. Ms. Sliwinski reports no history of alcohol use.   Review of Systems CONSTITUTIONAL: No weight loss, fever, chills, weakness or fatigue.  HEENT: Eyes: No visual loss, blurred vision, double vision or yellow sclerae.No hearing loss, sneezing, congestion, runny nose or sore throat.  SKIN: No rash or itching.  CARDIOVASCULAR: per hpi RESPIRATORY: No shortness of breath, cough or sputum.  GASTROINTESTINAL: No anorexia, nausea, vomiting or diarrhea. No abdominal pain or blood.  GENITOURINARY: No burning on urination, no polyuria NEUROLOGICAL: per hpi MUSCULOSKELETAL: No muscle, back pain, joint pain or stiffness.  LYMPHATICS: No enlarged nodes. No history of splenectomy.  PSYCHIATRIC: No history of depression or anxiety.  ENDOCRINOLOGIC: No reports of sweating, cold or heat intolerance. No polyuria or polydipsia.  Marland Kitchen   Physical Examination Today's Vitals   12/29/20 0827  BP: (!) 144/86  Pulse: 66  SpO2: 99%  Weight: 288 lb 3.2 oz (130.7 kg)  Height: 5\' 5"  (1.651 m)   Body mass index is 47.96 kg/m.  Gen: resting comfortably, no acute distress HEENT: no  scleral icterus, pupils equal round and reactive, no palptable cervical adenopathy,  CV: RRR, no m/r/g, no jvd Resp: Clear to auscultation bilaterally GI: abdomen is soft, non-tender, non-distended, normal bowel sounds, no hepatosplenomegaly MSK: extremities are warm, no edema.  Skin: warm, no rash Neuro:  no focal deficits Psych: appropriate affect   Diagnostic Studies  08/2017 holter Sinus rhythm  Rates 53 to 114 bpm  Average HR 76 bpm  Frequent PVCs  (13% total)  12 couplets, 5 triplets     10/2020 echo 1. Left ventricular ejection fraction, by estimation, is 55 to 60%. The  left ventricle has normal function. The left ventricle has no regional  wall motion abnormalities. There is mild left ventricular hypertrophy.  Left ventricular diastolic parameters  are indeterminate.  2. Right ventricular systolic function is  normal. The right ventricular  size is normal. Tricuspid regurgitation signal is inadequate for assessing  PA pressure.  3. The mitral valve is grossly normal. Trivial mitral valve  regurgitation.  4. The aortic valve is tricuspid. Aortic valve regurgitation is not  visualized.  5. The inferior vena cava is normal in size with greater than 50%  respiratory variability, suggesting right atrial pressure of 3 mmHg.    Jan 2022 14 day monitor  Predominant rhythm is normal sinus rhythm  Rare supraventricular ectopy  Frequent ventricular ectopy primarily as isolated PVCs (6.3%).  Symptoms correlated with sinus rhythm, PVCs.   14 day monitor. Patient had a min HR of 50 bpm, max HR of 197 bpm, and avg HR of 73 bpm. Predominant underlying rhythm was Sinus Rhythm. 2 Supraventricular Tachycardia runs occurred, the run with the fastest interval lasting 5 beats with a max rate of 197 bpm, the longest lasting 6 beats with an avg rate of 103 bpm. Isolated SVEs were rare (<1.0%), and no SVE Couplets or SVE Triplets were present. Isolated VEs were frequent (6.3%,  92933), VE Couplets were rare (<1.0%, 3), and VE Triplets were rare (<1.0%, 1). Ventricular Bigeminy and Trigeminy were present.   10/2020 nuclear stress  No diagnostic ST segment changes. Frequent PVCs were noted.  Small, mild intensity, mid apical anteroseptal defect that is partially reversible and consistent with variable soft tissue attenuation. Summed stress score is only 1.  This is a low risk study based on perfusion imaging.  Nuclear stress EF: 19%. Calculation appears to be inaccurate, possibly affected by frequent PVCs. Suggest echocardiogram to correlate.     Assessment and Plan   1. PVCs - symptoms overall controlled, continue prn lopressor  2. HTN - elevated today but home numbers at goal, continue current meds  3. Dizziness - no clear cardiac cause, ongoing workup with ENT  4. Chest pressure/SOB - symptoms have improved, recent benign echo and nuclear stress - no further workup at this time   F/u 6 months     Antoine Poche, M.D.

## 2020-12-29 NOTE — Patient Instructions (Signed)
Your physician wants you to follow-up in: 6 MONTHS WITH DR BRANCH   Your physician recommends that you continue on your current medications as directed. Please refer to the Current Medication list given to you today.  Thank you for choosing [facility]!!   

## 2021-01-05 ENCOUNTER — Ambulatory Visit: Payer: BC Managed Care – PPO | Admitting: Obstetrics and Gynecology

## 2021-01-20 ENCOUNTER — Other Ambulatory Visit: Payer: Self-pay | Admitting: Family

## 2021-01-20 ENCOUNTER — Other Ambulatory Visit: Payer: Self-pay | Admitting: Family Medicine

## 2021-01-24 ENCOUNTER — Ambulatory Visit (INDEPENDENT_AMBULATORY_CARE_PROVIDER_SITE_OTHER): Payer: BC Managed Care – PPO

## 2021-01-24 ENCOUNTER — Other Ambulatory Visit: Payer: Self-pay

## 2021-01-24 DIAGNOSIS — E538 Deficiency of other specified B group vitamins: Secondary | ICD-10-CM

## 2021-01-24 NOTE — Progress Notes (Signed)
B12 injection given and tolerated well.  

## 2021-01-27 ENCOUNTER — Other Ambulatory Visit: Payer: Self-pay | Admitting: Nurse Practitioner

## 2021-01-27 DIAGNOSIS — M25512 Pain in left shoulder: Secondary | ICD-10-CM

## 2021-02-17 DIAGNOSIS — R42 Dizziness and giddiness: Secondary | ICD-10-CM | POA: Diagnosis not present

## 2021-02-25 ENCOUNTER — Ambulatory Visit (INDEPENDENT_AMBULATORY_CARE_PROVIDER_SITE_OTHER): Payer: BC Managed Care – PPO

## 2021-02-25 ENCOUNTER — Other Ambulatory Visit: Payer: Self-pay

## 2021-02-25 DIAGNOSIS — E538 Deficiency of other specified B group vitamins: Secondary | ICD-10-CM | POA: Diagnosis not present

## 2021-02-25 NOTE — Progress Notes (Signed)
Pt tolerated b12 well. 

## 2021-02-28 ENCOUNTER — Other Ambulatory Visit: Payer: Self-pay | Admitting: Nurse Practitioner

## 2021-02-28 DIAGNOSIS — M25512 Pain in left shoulder: Secondary | ICD-10-CM

## 2021-03-02 ENCOUNTER — Encounter: Payer: Self-pay | Admitting: Obstetrics and Gynecology

## 2021-03-02 DIAGNOSIS — Z1231 Encounter for screening mammogram for malignant neoplasm of breast: Secondary | ICD-10-CM | POA: Diagnosis not present

## 2021-03-07 ENCOUNTER — Encounter: Payer: Self-pay | Admitting: Nurse Practitioner

## 2021-03-07 ENCOUNTER — Other Ambulatory Visit: Payer: Self-pay

## 2021-03-07 ENCOUNTER — Ambulatory Visit: Payer: BC Managed Care – PPO | Admitting: Nurse Practitioner

## 2021-03-07 VITALS — BP 115/63 | HR 86 | Temp 98.0°F | Resp 20 | Ht 65.0 in | Wt 291.0 lb

## 2021-03-07 DIAGNOSIS — K219 Gastro-esophageal reflux disease without esophagitis: Secondary | ICD-10-CM

## 2021-03-07 DIAGNOSIS — I10 Essential (primary) hypertension: Secondary | ICD-10-CM

## 2021-03-07 DIAGNOSIS — Z6841 Body Mass Index (BMI) 40.0 and over, adult: Secondary | ICD-10-CM

## 2021-03-07 DIAGNOSIS — E538 Deficiency of other specified B group vitamins: Secondary | ICD-10-CM

## 2021-03-07 LAB — CBC WITH DIFFERENTIAL/PLATELET
Basophils Absolute: 0 10*3/uL (ref 0.0–0.2)
Basos: 1 %
EOS (ABSOLUTE): 0.1 10*3/uL (ref 0.0–0.4)
Eos: 2 %
Hematocrit: 41.4 % (ref 34.0–46.6)
Hemoglobin: 13.5 g/dL (ref 11.1–15.9)
Immature Grans (Abs): 0 10*3/uL (ref 0.0–0.1)
Immature Granulocytes: 0 %
Lymphocytes Absolute: 2.3 10*3/uL (ref 0.7–3.1)
Lymphs: 43 %
MCH: 27.9 pg (ref 26.6–33.0)
MCHC: 32.6 g/dL (ref 31.5–35.7)
MCV: 86 fL (ref 79–97)
Monocytes Absolute: 0.6 10*3/uL (ref 0.1–0.9)
Monocytes: 11 %
Neutrophils Absolute: 2.3 10*3/uL (ref 1.4–7.0)
Neutrophils: 43 %
Platelets: 382 10*3/uL (ref 150–450)
RBC: 4.84 x10E6/uL (ref 3.77–5.28)
RDW: 13.3 % (ref 11.7–15.4)
WBC: 5.4 10*3/uL (ref 3.4–10.8)

## 2021-03-07 LAB — CMP14+EGFR
ALT: 15 IU/L (ref 0–32)
AST: 14 IU/L (ref 0–40)
Albumin/Globulin Ratio: 1.2 (ref 1.2–2.2)
Albumin: 4.5 g/dL (ref 3.8–4.9)
Alkaline Phosphatase: 136 IU/L — ABNORMAL HIGH (ref 44–121)
BUN/Creatinine Ratio: 20 (ref 9–23)
BUN: 16 mg/dL (ref 6–24)
Bilirubin Total: 0.6 mg/dL (ref 0.0–1.2)
CO2: 24 mmol/L (ref 20–29)
Calcium: 10.2 mg/dL (ref 8.7–10.2)
Chloride: 98 mmol/L (ref 96–106)
Creatinine, Ser: 0.81 mg/dL (ref 0.57–1.00)
Globulin, Total: 3.7 g/dL (ref 1.5–4.5)
Glucose: 91 mg/dL (ref 65–99)
Potassium: 4.1 mmol/L (ref 3.5–5.2)
Sodium: 137 mmol/L (ref 134–144)
Total Protein: 8.2 g/dL (ref 6.0–8.5)
eGFR: 87 mL/min/{1.73_m2} (ref >59)

## 2021-03-07 LAB — LIPID PANEL
Chol/HDL Ratio: 3.5 ratio (ref 0.0–4.4)
Cholesterol, Total: 208 mg/dL — ABNORMAL HIGH (ref 100–199)
HDL: 59 mg/dL (ref >39)
LDL Chol Calc (NIH): 135 mg/dL — ABNORMAL HIGH (ref 0–99)
Triglycerides: 81 mg/dL (ref 0–149)
VLDL Cholesterol Cal: 14 mg/dL (ref 5–40)

## 2021-03-07 MED ORDER — METOPROLOL TARTRATE 25 MG PO TABS: 12.5000 mg | ORAL_TABLET | Freq: Two times a day (BID) | ORAL | 1 refills | Status: DC | PRN

## 2021-03-07 MED ORDER — AMLODIPINE BESYLATE 2.5 MG PO TABS: 2.5000 mg | ORAL_TABLET | Freq: Every day | ORAL | 1 refills | Status: DC

## 2021-03-07 MED ORDER — OMEPRAZOLE 20 MG PO CPDR: DELAYED_RELEASE_CAPSULE | ORAL | 1 refills | Status: DC

## 2021-03-07 MED ORDER — HYDROCHLOROTHIAZIDE 25 MG PO TABS: 25.0000 mg | ORAL_TABLET | Freq: Every day | ORAL | 1 refills | Status: DC

## 2021-03-07 NOTE — Patient Instructions (Signed)
Vitamin B12 Deficiency Vitamin B12 deficiency occurs when the body does not have enough vitamin B12, which is an important vitamin. The body needs this vitamin:  To make red blood cells.  To make DNA. This is the genetic material inside cells.  To help the nerves work properly so they can carry messages from the brain to the body. Vitamin B12 deficiency can cause various health problems, such as a low red blood cell count (anemia) or nerve damage. What are the causes? This condition may be caused by:  Not eating enough foods that contain vitamin B12.  Not having enough stomach acid and digestive fluids to properly absorb vitamin B12 from the food that you eat.  Certain digestive system diseases that make it hard to absorb vitamin B12. These diseases include Crohn's disease, chronic pancreatitis, and cystic fibrosis.  A condition in which the body does not make enough of a protein (intrinsic factor), resulting in too few red blood cells (pernicious anemia).  Having a surgery in which part of the stomach or small intestine is removed.  Taking certain medicines that make it hard for the body to absorb vitamin B12. These medicines include: ? Heartburn medicines (antacids and proton pump inhibitors). ? Certain antibiotic medicines. ? Some medicines that are used to treat diabetes, tuberculosis, gout, or high cholesterol. What increases the risk? The following factors may make you more likely to develop a B12 deficiency:  Being older than age 50.  Eating a vegetarian or vegan diet, especially while you are pregnant.  Eating a poor diet while you are pregnant.  Taking certain medicines.  Having alcoholism. What are the signs or symptoms? In some cases, there are no symptoms of this condition. If the condition leads to anemia or nerve damage, various symptoms can occur, such as:  Weakness.  Fatigue.  Loss of appetite.  Weight loss.  Numbness or tingling in your hands and  feet.  Redness and burning of the tongue.  Confusion or memory problems.  Depression.  Sensory problems, such as color blindness, ringing in the ears, or loss of taste.  Diarrhea or constipation.  Trouble walking. If anemia is severe, symptoms can include:  Shortness of breath.  Dizziness.  Rapid heart rate (tachycardia). How is this diagnosed? This condition may be diagnosed with a blood test to measure the level of vitamin B12 in your blood. You may also have other tests, including:  A group of tests that measure certain characteristics of blood cells (complete blood count, CBC).  A blood test to measure intrinsic factor.  A procedure where a thin tube with a camera on the end is used to look into your stomach or intestines (endoscopy). Other tests may be needed to discover the cause of B12 deficiency. How is this treated? Treatment for this condition depends on the cause. This condition may be treated by:  Changing your eating and drinking habits, such as: ? Eating more foods that contain vitamin B12. ? Drinking less alcohol or no alcohol.  Getting vitamin B12 injections.  Taking vitamin B12 supplements. Your health care provider will tell you which dosage is best for you. Follow these instructions at home: Eating and drinking  Eat lots of healthy foods that contain vitamin B12, including: ? Meats and poultry. This includes beef, pork, chicken, turkey, and organ meats, such as liver. ? Seafood. This includes clams, rainbow trout, salmon, tuna, and haddock. ? Eggs. ? Cereal and dairy products that are fortified. This means that vitamin B12 has   been added to the food. Check the label on the package to see if the food is fortified. The items listed above may not be a complete list of recommended foods and beverages. Contact a dietitian for more information.   General instructions  Get any injections that are prescribed by your health care provider.  Take  supplements only as told by your health care provider. Follow the directions carefully.  Do not drink alcohol if your health care provider tells you not to. In some cases, you may only be asked to limit alcohol use.  Keep all follow-up visits as told by your health care provider. This is important. Contact a health care provider if:  Your symptoms come back. Get help right away if you:  Develop shortness of breath.  Have a rapid heart rate.  Have chest pain.  Become dizzy or lose consciousness. Summary  Vitamin B12 deficiency occurs when the body does not have enough vitamin B12.  The main causes of vitamin B12 deficiency include dietary deficiency, digestive diseases, pernicious anemia, and having a surgery in which part of the stomach or small intestine is removed.  In some cases, there are no symptoms of this condition. If the condition leads to anemia or nerve damage, various symptoms can occur, such as weakness, shortness of breath, and numbness.  Treatment may include getting vitamin B12 injections or taking vitamin B12 supplements. Eat lots of healthy foods that contain vitamin B12. This information is not intended to replace advice given to you by your health care provider. Make sure you discuss any questions you have with your health care provider. Document Revised: 04/11/2019 Document Reviewed: 07/02/2018 Elsevier Patient Education  2021 [employer] Inc.  

## 2021-03-07 NOTE — Progress Notes (Signed)
Subjective:    Patient ID: Renee Thomas, female    DOB: 1971/08/20, 50 y.o.   MRN: [id number]   Chief Complaint: Medical Management of Chronic Issues    HPI:  1. Primary hypertension No c/o chest pan, sob or headache. Doe snot ceck blood pressure at home BP Readings from Last 3 Encounters:  03/07/21 115/63  12/29/20 (!) 144/86  12/02/20 125/74     2. GERD without esophagitis Is on mperazole daily and is doing well.  3. Low serum vitamin B12 Is on monthly b12 inh\jections here at the office.  4. BMI 45.0-49.9, adult (HCC) No recent weight changes Wt Readings from Last 3 Encounters:  03/07/21 291 lb (132 kg)  12/29/20 288 lb 3.2 oz (130.7 kg)  12/02/20 285 lb (129.3 kg)   BMI Readings from Last 3 Encounters:  03/07/21 48.42 kg/m  12/29/20 47.96 kg/m  12/02/20 47.43 kg/m       Outpatient Encounter Medications as of 03/07/2021  Medication Sig  . amLODipine (NORVASC) 2.5 MG tablet TAKE 1 TABLET BY MOUTH EVERY DAY  . Carboxymethylcellul-Glycerin (CLEAR EYES FOR DRY EYES OP) Place 1-2 drops into both eyes daily as needed.  . cyanocobalamin (,VITAMIN B-12,) 1000 MCG/ML injection Inject 1,000 mcg into the muscle every 30 (thirty) days.  . fexofenadine (ALLEGRA) 180 MG tablet TAKE 1 TABLET BY MOUTH EVERY DAY  . fluticasone (FLONASE) 50 MCG/ACT nasal spray SPRAY 2 SPRAYS INTO EACH NOSTRIL EVERY DAY  . hydrochlorothiazide (HYDRODIURIL) 25 MG tablet Take 1 tablet (25 mg total) by mouth daily.  Marland Kitchen KRILL OIL PO Take 350 mg by mouth daily.   . naproxen (NAPROSYN) 500 MG tablet TAKE 1 TABLET BY MOUTH 2 TIMES DAILY WITH A MEAL.  Marland Kitchen omeprazole (PRILOSEC) 20 MG capsule TAKE 1 CAPSULE BY MOUTH EVERY DAY  . Probiotic Product (TRUBIOTICS PO) Take by mouth daily.  . Cholecalciferol (VITAMIN D PO) Take by mouth daily. (Patient not taking: Reported on 03/07/2021)  . metoprolol tartrate (LOPRESSOR) 25 MG tablet Take 0.5 tablets (12.5 mg total) by mouth 2 (two) times daily as needed  (palpitations).   Facility-Administered Encounter Medications as of 03/07/2021  Medication  . cyanocobalamin ((VITAMIN B-12)) injection 1,000 mcg    History reviewed. No pertinent surgical history.  Family History  Problem Relation Age of Onset  . Hypertension Mother   . Diabetes Mother   . Other Mother        renal disease  . Hypertension Father   . Heart attack Father   . Hypertension Sister   . Diabetes Brother   . Diabetes Brother     New complaints: She has been havng frequent dizzy spells. She has seen several pecialist and they now think sh is having vistibular migraines. She has appointment at [facility] June. She is currently out of work.  Social history: Lives with her sister  Controlled substance contract: n/a    Review of Systems  Constitutional: Negative for diaphoresis.  Eyes: Negative for pain.  Respiratory: Negative for shortness of breath.   Cardiovascular: Negative for chest pain, palpitations and leg swelling.  Gastrointestinal: Negative for abdominal pain.  Endocrine: Negative for polydipsia.  Skin: Negative for rash.  Neurological: Negative for dizziness, weakness and headaches.  Hematological: Does not bruise/bleed easily.  All other systems reviewed and are negative.      Objective:   Physical Exam Vitals and nursing note reviewed.  Constitutional:      General: She is not in acute distress.  Appearance: Normal appearance. She is well-developed.  HENT:     Head: Normocephalic.     Nose: Nose normal.  Eyes:     Pupils: Pupils are equal, round, and reactive to light.  Neck:     Vascular: No carotid bruit or JVD.  Cardiovascular:     Rate and Rhythm: Normal rate and regular rhythm.     Heart sounds: Normal heart sounds.  Pulmonary:     Effort: Pulmonary effort is normal. No respiratory distress.     Breath sounds: Normal breath sounds. No wheezing or rales.  Chest:     Chest wall: No tenderness.  Abdominal:     General: Bowel  sounds are normal. There is no distension or abdominal bruit.     Palpations: Abdomen is soft. There is no hepatomegaly, splenomegaly, mass or pulsatile mass.     Tenderness: There is no abdominal tenderness.  Musculoskeletal:        General: Normal range of motion.     Cervical back: Normal range of motion and neck supple.  Lymphadenopathy:     Cervical: No cervical adenopathy.  Skin:    General: Skin is warm and dry.  Neurological:     Mental Status: She is alert and oriented to person, place, and time.     Deep Tendon Reflexes: Reflexes are normal and symmetric.  Psychiatric:        Behavior: Behavior normal.        Thought Content: Thought content normal.        Judgment: Judgment normal.     BP 115/63   Pulse 86   Temp 98 F (36.7 C) (Temporal)   Resp 20   Ht 5\' 5"  (1.651 m)   Wt 291 lb (132 kg)   SpO2 97%   BMI 48.42 kg/m        Assessment & Plan:  VONNE MCDANEL comes in today with chief complaint of Medical Management of Chronic Issues   Diagnosis and orders addressed:  1. Primary hypertension Low sodium diet - hydrochlorothiazide (HYDRODIURIL) 25 MG tablet; Take 1 tablet (25 mg total) by mouth daily.  Dispense: 90 tablet; Refill: 1 - amLODipine (NORVASC) 2.5 MG tablet; Take 1 tablet (2.5 mg total) by mouth daily.  Dispense: 90 tablet; Refill: 1 - metoprolol tartrate (LOPRESSOR) 25 MG tablet; Take 0.5 tablets (12.5 mg total) by mouth 2 (two) times daily as needed (palpitations).  Dispense: 180 tablet; Refill: 1  2. GERD without esophagitis Avoid spicy foods Do not eat 2 hours prior to bedtime - omeprazole (PRILOSEC) 20 MG capsule; TAKE 1 CAPSULE BY MOUTH EVERY DAY  Dispense: 90 capsule; Refill: 1  3. Low serum vitamin B12 Continue monthly b12 injections  4. BMI 45.0-49.9, adult (HCC) Discussed diet and exercise for person with BMI >25 Will recheck weight in 3-6 months   Labs pending Health Maintenance reviewed Diet and exercise encouraged  Follow  up plan: 6 months   [name] 03-31-1984, FNP

## 2021-03-14 NOTE — Progress Notes (Signed)
50 y.o. G0P0000 Single African American female here for annual exam.    Having work up for possible vestibular migraines. Has had negative cardiac work up also. Not able to work or do exercise due to these migraines. Normal CT of brain.   Patient complaining of in-grown hair in panty line. Has some boils.  Using Dove antibacterial soap.  She occasionally has this under her arms.   FSH 10.7 on 09/26/19. Has had hot flashes the last couple of months.  Had a temporary increase in her prolactin, but this returned to normal. Normal TFTS  09/10/20.  She wants to have a vit D check today.   Received her Covid vaccine and booster.   PCP: Paulene Floor, FNP    Patient's last menstrual period was 11/06/2018 (approximate).           Sexually active: No.   Not active since 2016.  The current method of family planning is abstinence.    Exercising: No.  The patient does not participate in regular exercise at present. Smoker:  no  Health Maintenance: Pap: 09-26-19 Neg, 08-25-16 Neg:Neg HR HPV, 07-01-14 Neg History of abnormal Pap:  no MMG: 03-02-21 normal per patient with [facility].   Colonoscopy:  2019 f/ u 89yrs polyps BMD:n/a   Result  n/a TDaP:  2014 Gardasil:   no HIV: 07-09-15 NR Hep C: 08-25-16 Neg Screening Labs:  PCP.    reports that she has never smoked. She has never used smokeless tobacco. She reports that she does not drink alcohol and does not use drugs.  Past Medical History:  Diagnosis Date  . Anemia   . Balance disorder   . GERD (gastroesophageal reflux disease)   . MVP (mitral valve prolapse)   . PVC's (premature ventricular contractions)     History reviewed. No pertinent surgical history.  Current Outpatient Medications  Medication Sig Dispense Refill  . amLODipine (NORVASC) 2.5 MG tablet Take 1 tablet (2.5 mg total) by mouth daily. 90 tablet 1  . Carboxymethylcellul-Glycerin (CLEAR EYES FOR DRY EYES OP) Place 1-2 drops into both eyes daily as needed.    .  cyanocobalamin (,VITAMIN B-12,) 1000 MCG/ML injection Inject 1,000 mcg into the muscle every 30 (thirty) days.    . fexofenadine (ALLEGRA) 180 MG tablet TAKE 1 TABLET BY MOUTH EVERY DAY 90 tablet 1  . fluticasone (FLONASE) 50 MCG/ACT nasal spray SPRAY 2 SPRAYS INTO EACH NOSTRIL EVERY DAY 48 mL 1  . hydrochlorothiazide (HYDRODIURIL) 25 MG tablet Take 1 tablet (25 mg total) by mouth daily. 90 tablet 1  . KRILL OIL PO Take 350 mg by mouth daily.     . metoprolol tartrate (LOPRESSOR) 25 MG tablet Take 0.5 tablets (12.5 mg total) by mouth 2 (two) times daily as needed (palpitations). 180 tablet 1  . naproxen (NAPROSYN) 500 MG tablet TAKE 1 TABLET BY MOUTH 2 TIMES DAILY WITH A MEAL. 60 tablet 0  . omeprazole (PRILOSEC) 20 MG capsule TAKE 1 CAPSULE BY MOUTH EVERY DAY 90 capsule 1  . Probiotic Product (TRUBIOTICS PO) Take by mouth daily.    . Cholecalciferol (VITAMIN D PO) Take by mouth daily. (Patient not taking: No sig reported)     Current Facility-Administered Medications  Medication Dose Route Frequency Provider Last Rate Last Admin  . cyanocobalamin ((VITAMIN B-12)) injection 1,000 mcg  1,000 mcg Intramuscular Q30 days Bennie Pierini, FNP   1,000 mcg at 02/25/21 5465    Family History  Problem Relation Age of Onset  . Hypertension Mother   .  Diabetes Mother   . Other Mother        renal disease  . Hypertension Father   . Heart attack Father   . Hypertension Sister   . Diabetes Brother   . Diabetes Brother     Review of Systems  All other systems reviewed and are negative.   Exam:   BP 122/88 (Cuff Size: Large)   Pulse 82   Ht 5' 5.5" (1.664 m)   Wt 291 lb (132 kg)   LMP 11/06/2018 (Approximate)   SpO2 100%   BMI 47.69 kg/m     General appearance: alert, cooperative and appears stated age Head: normocephalic, without obvious abnormality, atraumatic Neck: no adenopathy, supple, symmetrical, trachea midline and thyroid normal to inspection and palpation Lungs: clear to  auscultation bilaterally Breasts: normal appearance, no masses or tenderness, No nipple retraction or dimpling, No nipple discharge or bleeding, No axillary adenopathy Heart: regular rate and rhythm Abdomen: soft, non-tender; no masses, no organomegaly Extremities: extremities normal, atraumatic, no cyanosis or edema Skin: skin color, texture, turgor normal. No rashes or lesions Lymph nodes: cervical, supraclavicular, and axillary nodes normal. Neurologic: grossly normal  Pelvic: External genitalia:  Left inferior labia majora with vulvar abscess 1 cm - draining and slightly tender.               No abnormal inguinal nodes palpated.              Urethra:  normal appearing urethra with no masses, tenderness or lesions              Bartholins and Skenes: normal                 Vagina: normal appearing vagina with normal color and discharge, no lesions              Cervix: no lesions              Pap taken: No. Bimanual Exam:  Uterus:  normal size, contour, position, consistency, mobility, non-tender              Adnexa: no mass, fullness, tenderness              Rectal exam: Yes.  .  Confirms.              Anus:  normal sphincter tone, no lesions  Chaperone was present for exam:  Claudette Laws  Assessment:   Well woman visit with normal exam. Vestibular migraines.  Amenorrhea.   Vulvar abscess.  Hx vulvar boils.   Plan: Mammogram screening discussed. Self breast awareness reviewed. Pap and HR HPV next year.  Guidelines for Calcium, Vitamin D, regular exercise program including cardiovascular and weight bearing exercise. Check FSH, estradiol, vit D.  Doxycycline 100 mg po bid x 7 days.  Cleocin T lotion to area bid prn.   Covid booster discussed.  Follow up annually and prn.   After visit summary provided.

## 2021-03-16 ENCOUNTER — Encounter: Payer: Self-pay | Admitting: Obstetrics and Gynecology

## 2021-03-16 ENCOUNTER — Other Ambulatory Visit: Payer: Self-pay

## 2021-03-16 ENCOUNTER — Ambulatory Visit (INDEPENDENT_AMBULATORY_CARE_PROVIDER_SITE_OTHER): Payer: BC Managed Care – PPO | Admitting: Obstetrics and Gynecology

## 2021-03-16 VITALS — BP 122/88 | HR 82 | Ht 65.5 in | Wt 291.0 lb

## 2021-03-16 DIAGNOSIS — N764 Abscess of vulva: Secondary | ICD-10-CM

## 2021-03-16 DIAGNOSIS — N912 Amenorrhea, unspecified: Secondary | ICD-10-CM | POA: Diagnosis not present

## 2021-03-16 DIAGNOSIS — Z01419 Encounter for gynecological examination (general) (routine) without abnormal findings: Secondary | ICD-10-CM | POA: Diagnosis not present

## 2021-03-16 DIAGNOSIS — R7989 Other specified abnormal findings of blood chemistry: Secondary | ICD-10-CM

## 2021-03-16 MED ORDER — DOXYCYCLINE HYCLATE 100 MG PO CAPS: 100.0000 mg | ORAL_CAPSULE | Freq: Two times a day (BID) | ORAL | 0 refills | Status: DC

## 2021-03-16 MED ORDER — CLINDAMYCIN PHOSPHATE 1 % EX SOLN: Freq: Two times a day (BID) | CUTANEOUS | 1 refills | Status: DC

## 2021-03-16 NOTE — Patient Instructions (Signed)

## 2021-03-17 ENCOUNTER — Other Ambulatory Visit: Payer: Self-pay

## 2021-03-17 ENCOUNTER — Encounter: Payer: Self-pay | Admitting: Obstetrics and Gynecology

## 2021-03-17 LAB — FOLLICLE STIMULATING HORMONE: FSH: 61 m[IU]/mL

## 2021-03-17 LAB — VITAMIN D 25 HYDROXY (VIT D DEFICIENCY, FRACTURES): Vit D, 25-Hydroxy: 15 ng/mL — ABNORMAL LOW (ref 30–100)

## 2021-03-17 LAB — ESTRADIOL: Estradiol: 16 pg/mL

## 2021-03-17 MED ORDER — VITAMIN D (ERGOCALCIFEROL) 1.25 MG (50000 UNIT) PO CAPS: ORAL_CAPSULE | ORAL | 0 refills | Status: DC

## 2021-03-17 NOTE — Addendum Note (Signed)
Addended by: Ardell Isaacs, Debbe Bales E on: 03/17/2021 09:09 AM   Modules accepted: Orders

## 2021-03-30 ENCOUNTER — Ambulatory Visit (INDEPENDENT_AMBULATORY_CARE_PROVIDER_SITE_OTHER): Payer: BC Managed Care – PPO

## 2021-03-30 ENCOUNTER — Other Ambulatory Visit: Payer: Self-pay

## 2021-03-30 DIAGNOSIS — E538 Deficiency of other specified B group vitamins: Secondary | ICD-10-CM | POA: Diagnosis not present

## 2021-03-30 NOTE — Progress Notes (Signed)
Pt tolerated b12 well. 

## 2021-04-07 ENCOUNTER — Other Ambulatory Visit: Payer: Self-pay | Admitting: Nurse Practitioner

## 2021-04-07 DIAGNOSIS — M25512 Pain in left shoulder: Secondary | ICD-10-CM

## 2021-04-15 IMAGING — CT CT HEAD W/O CM
3 series · 16 of 47 positions shown, 19 images · non-contrast
Comparison: None.

CLINICAL DATA: Worsening headache

EXAM:
CT HEAD WITHOUT CONTRAST
TECHNIQUE: Contiguous axial images were obtained from the base of the skull
through the vertex without intravenous contrast.

[Series 2: head w o · axial · 0.45mm/px · z∈[-5,+125]mm · 10 of 32 slices shown, 13 images]
[im 3/32  brain]
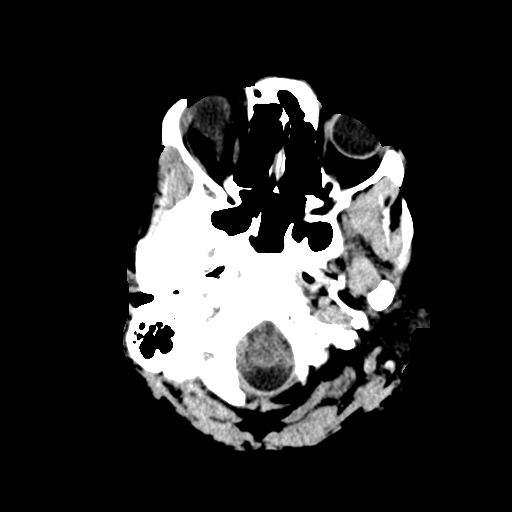
[im 3/32  bone]
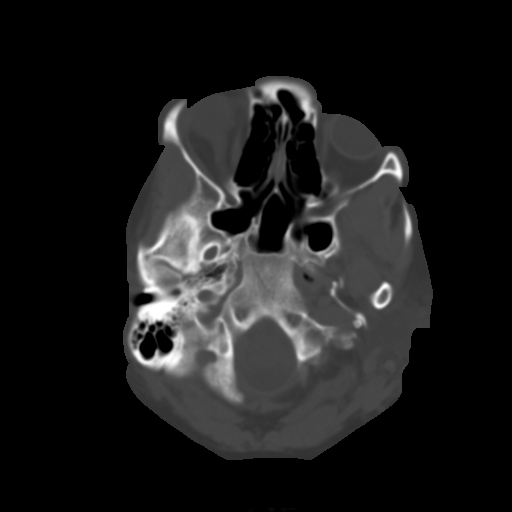
[im 6/32  brain]
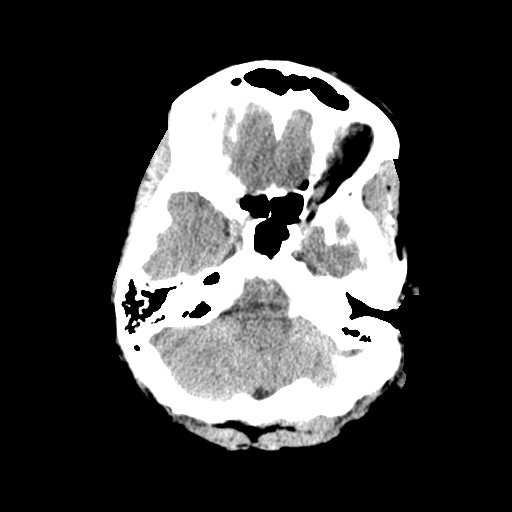
[im 9/32  brain]
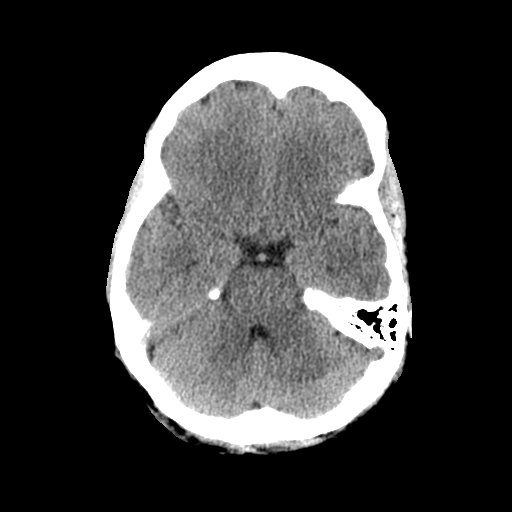
[im 11/32  brain]
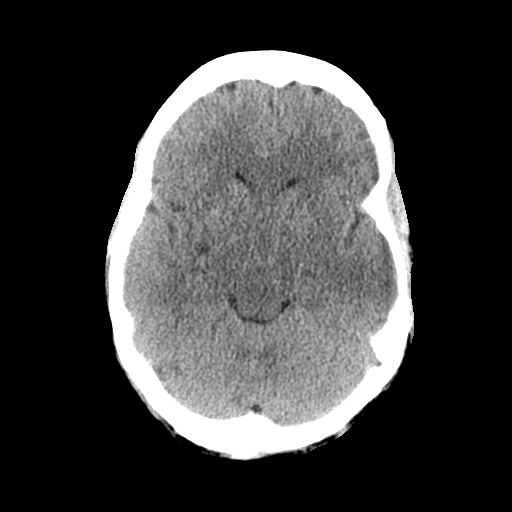
[im 14/32  brain]
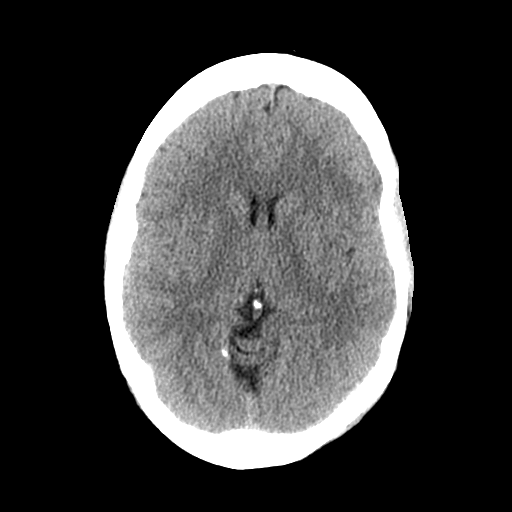
[im 14/32  bone]
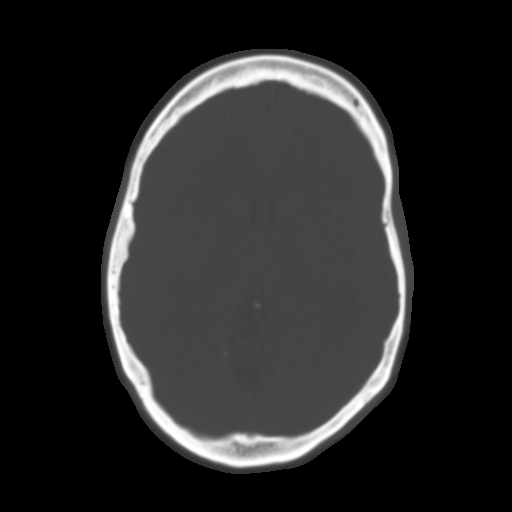
[im 18/32  brain]
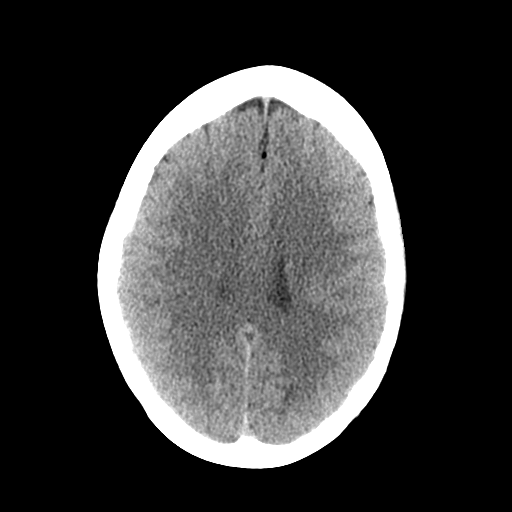
[im 21/32  brain]
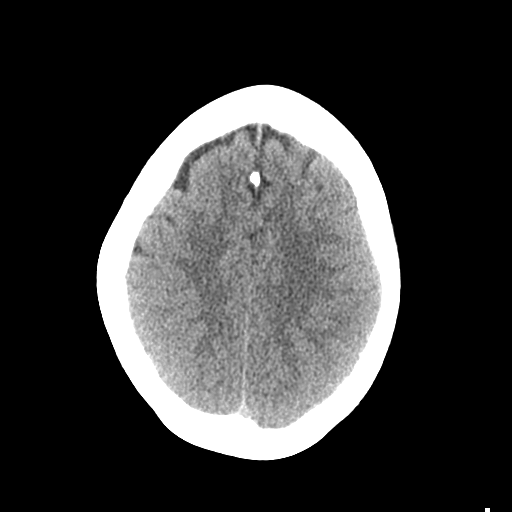
[im 24/32  brain]
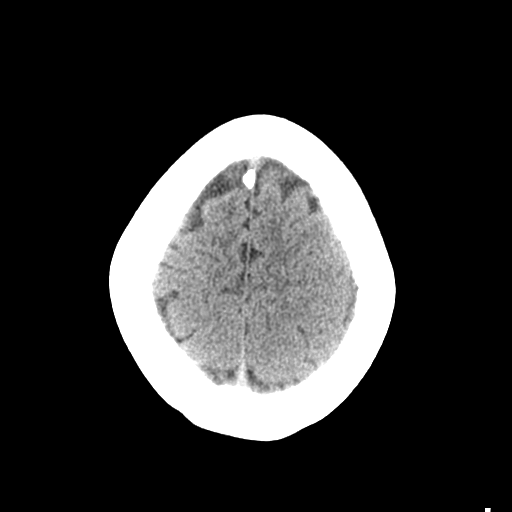
[im 26/32  brain]
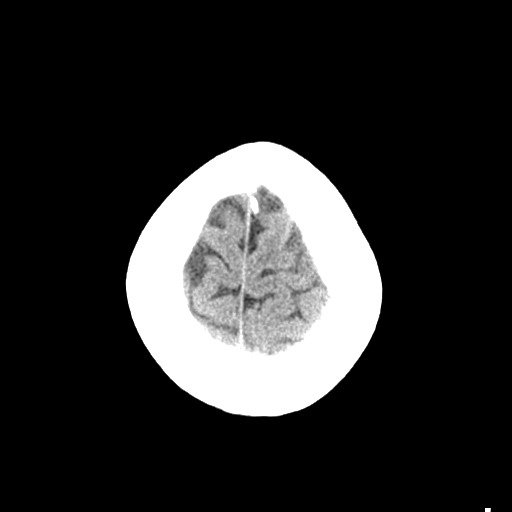
[im 26/32  bone]
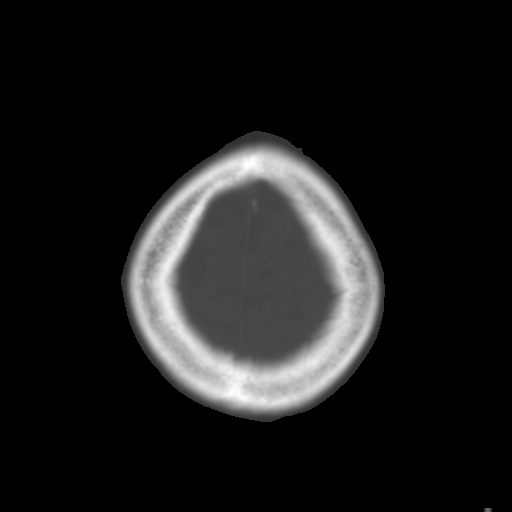
[im 29/32  brain]
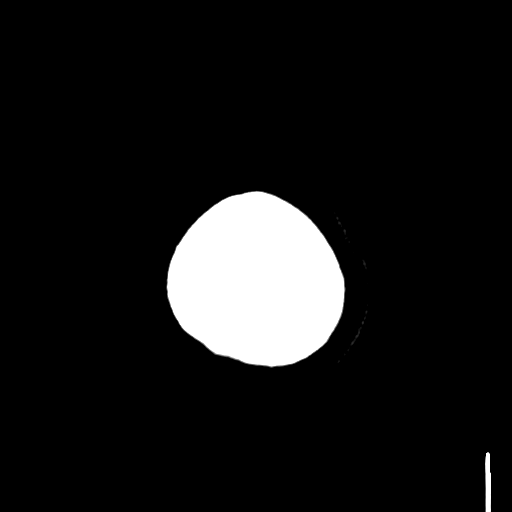

[Series 4: coronal soft · coronal · 0.35mm/px · 3 of 75 slices shown]
[im 25/75  brain]
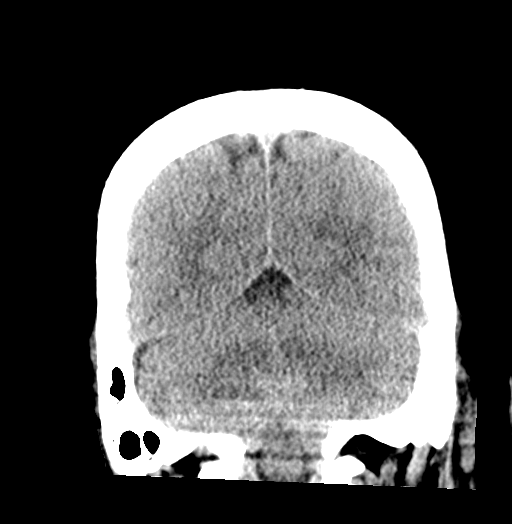
[im 33/75  brain]
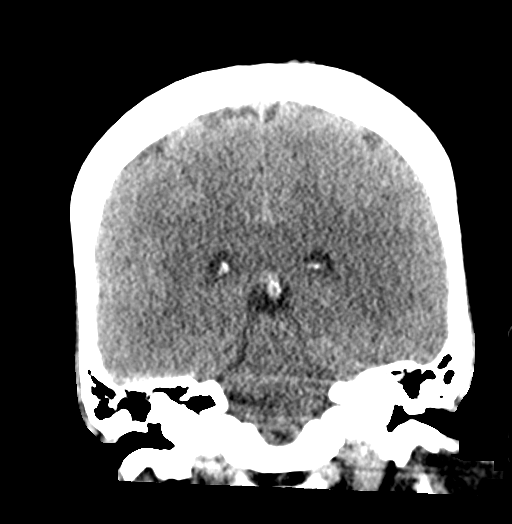
[im 42/75  brain]
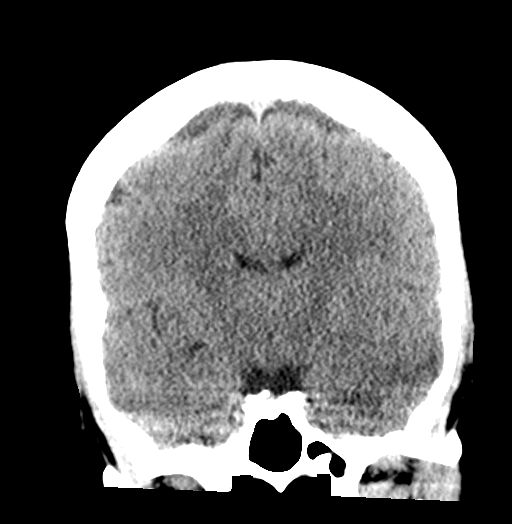

[Series 5: sagittal soft · sagittal · 0.34mm/px · 3 of 60 slices shown]
[im 20/60  brain]
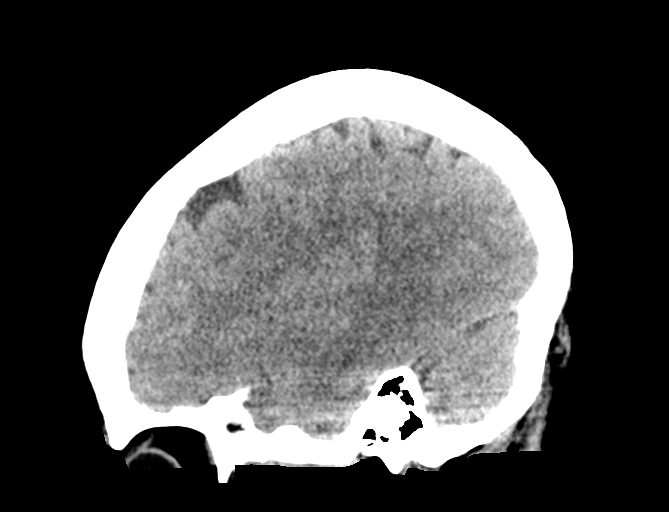
[im 30/60  brain]
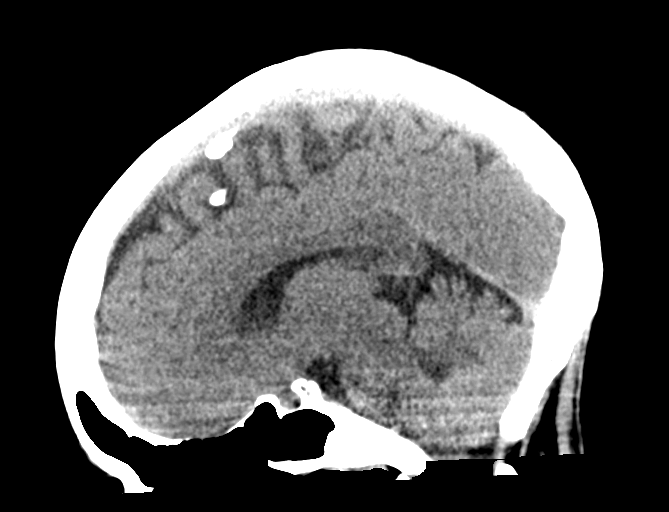
[im 40/60  brain]
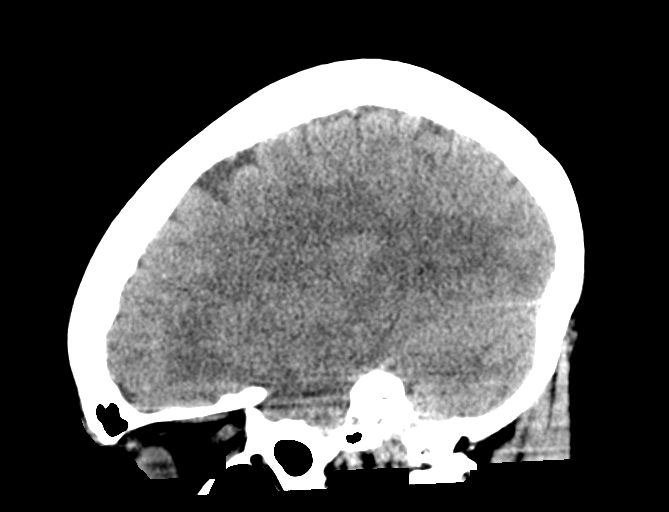

[16 of 47 positions shown; findings below may reference images not displayed]

FINDINGS: Brain: No evidence of acute territorial infarction, hemorrhage,
hydrocephalus,extra-axial collection or mass lesion/mass effect.
Normal gray-white differentiation. Ventricles are normal in size and
contour.

Vascular: No hyperdense vessel or unexpected calcification.

Skull: The skull is intact. No fracture or focal lesion identified.

Sinuses/Orbits: The visualized paranasal sinuses and mastoid air
cells are clear. The orbits and globes intact.

Other: None
IMPRESSION: No acute intracranial abnormality.

## 2021-04-20 DIAGNOSIS — H9313 Tinnitus, bilateral: Secondary | ICD-10-CM | POA: Diagnosis not present

## 2021-04-20 DIAGNOSIS — Z880 Allergy status to penicillin: Secondary | ICD-10-CM | POA: Diagnosis not present

## 2021-04-20 DIAGNOSIS — R42 Dizziness and giddiness: Secondary | ICD-10-CM | POA: Diagnosis not present

## 2021-04-20 DIAGNOSIS — E878 Other disorders of electrolyte and fluid balance, not elsewhere classified: Secondary | ICD-10-CM | POA: Diagnosis not present

## 2021-04-20 DIAGNOSIS — H903 Sensorineural hearing loss, bilateral: Secondary | ICD-10-CM | POA: Diagnosis not present

## 2021-04-20 DIAGNOSIS — Z887 Allergy status to serum and vaccine status: Secondary | ICD-10-CM | POA: Diagnosis not present

## 2021-04-20 DIAGNOSIS — Z882 Allergy status to sulfonamides status: Secondary | ICD-10-CM | POA: Diagnosis not present

## 2021-04-29 ENCOUNTER — Ambulatory Visit (INDEPENDENT_AMBULATORY_CARE_PROVIDER_SITE_OTHER): Payer: BC Managed Care – PPO | Admitting: *Deleted

## 2021-04-29 ENCOUNTER — Other Ambulatory Visit: Payer: Self-pay

## 2021-04-29 DIAGNOSIS — E538 Deficiency of other specified B group vitamins: Secondary | ICD-10-CM | POA: Diagnosis not present

## 2021-05-08 ENCOUNTER — Emergency Department (HOSPITAL_COMMUNITY)
Admission: EM | Admit: 2021-05-08 | Discharge: 2021-05-08 | Disposition: A | Discharge status: Home / Self Care | Payer: BC Managed Care – PPO | Attending: Emergency Medicine | Admitting: Emergency Medicine

## 2021-05-08 ENCOUNTER — Other Ambulatory Visit: Payer: Self-pay

## 2021-05-08 ENCOUNTER — Encounter (HOSPITAL_COMMUNITY): Payer: Self-pay

## 2021-05-08 DIAGNOSIS — Z91010 Allergy to peanuts: Secondary | ICD-10-CM | POA: Insufficient documentation

## 2021-05-08 DIAGNOSIS — R55 Syncope and collapse: Secondary | ICD-10-CM | POA: Diagnosis not present

## 2021-05-08 DIAGNOSIS — R42 Dizziness and giddiness: Secondary | ICD-10-CM | POA: Insufficient documentation

## 2021-05-08 DIAGNOSIS — R000 Tachycardia, unspecified: Secondary | ICD-10-CM | POA: Diagnosis not present

## 2021-05-08 LAB — CBC
HCT: 40.1 % (ref 36.0–46.0)
Hemoglobin: 12.8 g/dL (ref 12.0–15.0)
MCH: 28.7 pg (ref 26.0–34.0)
MCHC: 31.9 g/dL (ref 30.0–36.0)
MCV: 89.9 fL (ref 80.0–100.0)
Platelets: 374 10*3/uL (ref 150–400)
RBC: 4.46 MIL/uL (ref 3.87–5.11)
RDW: 14.6 % (ref 11.5–15.5)
WBC: 6.6 10*3/uL (ref 4.0–10.5)
nRBC: 0 % (ref 0.0–0.2)

## 2021-05-08 LAB — BASIC METABOLIC PANEL
Anion gap: 8 (ref 5–15)
BUN: 16 mg/dL (ref 6–20)
CO2: 25 mmol/L (ref 22–32)
Calcium: 9 mg/dL (ref 8.9–10.3)
Chloride: 102 mmol/L (ref 98–111)
Creatinine, Ser: 0.83 mg/dL (ref 0.44–1.00)
GFR, Estimated: >60 mL/min (ref >60)
Glucose, Bld: 126 mg/dL — ABNORMAL HIGH (ref 70–99)
Potassium: 3.2 mmol/L — ABNORMAL LOW (ref 3.5–5.1)
Sodium: 135 mmol/L (ref 135–145)

## 2021-05-08 LAB — CBG MONITORING, ED: Glucose-Capillary: 124 mg/dL — ABNORMAL HIGH (ref 70–99)

## 2021-05-08 MED ORDER — POTASSIUM CHLORIDE CRYS ER 20 MEQ PO TBCR
20.0000 meq | EXTENDED_RELEASE_TABLET | Freq: Once | ORAL | Status: AC
  Administered 2021-05-08: 20 meq via ORAL
  Filled 2021-05-08: qty 1

## 2021-05-08 NOTE — ED Triage Notes (Signed)
Pt presents to ED with complaints of feeling like she is going to pass out. Pt states when it happens she notices her heart racing.

## 2021-05-08 NOTE — ED Provider Notes (Signed)
[facility] EMERGENCY DEPARTMENT Provider Note   CSN: [id number] Arrival date & time: 05/08/21  1703     History Chief Complaint  Patient presents with   Near Syncope    Renee Thomas is a 50 y.o. female.  She is here with complaint of 2 episodes of feeling lightheaded today like she might pass out.  She said she also had a rapid heart rate when it happened.  She said the heart rate was around 120.  She does not feel lightheaded now.  This has been going on for about 8 or 10 months.  She is followed up with cardiology and is awaiting neurology appointment.  She said she has had multiple tests for this.  The history is provided by the patient.  Near Syncope This is a recurrent problem. The current episode started 3 to 5 hours ago. The problem occurs every several days. The problem has not changed since onset.Associated symptoms include headaches. Pertinent negatives include no chest pain, no abdominal pain and no shortness of breath. Nothing aggravates the symptoms. Nothing relieves the symptoms. She has tried rest and water for the symptoms. The treatment provided no relief.      Past Medical History:  Diagnosis Date   Anemia    Balance disorder    GERD (gastroesophageal reflux disease)    Low vitamin D level 2022   MVP (mitral valve prolapse)    PVC's (premature ventricular contractions)     Patient Active Problem List   Diagnosis Date Noted   Primary hypertension 09/10/2020   GERD without esophagitis 05/02/2019   BMI 45.0-49.9, adult (HCC) 05/02/2019   Low serum vitamin B12 11/17/2016    History reviewed. No pertinent surgical history.   OB History     Gravida  0   Para  0   Term  0   Preterm  0   AB  0   Living  0      SAB  0   IAB  0   Ectopic  0   Multiple  0   Live Births              Family History  Problem Relation Age of Onset   Hypertension Mother    Diabetes Mother    Other Mother        renal disease   Hypertension Father     Heart attack Father    Hypertension Sister    Diabetes Brother    Diabetes Brother     Social History   Tobacco Use   Smoking status: Never   Smokeless tobacco: Never  Vaping Use   Vaping Use: Never used  Substance Use Topics   Alcohol use: No    Alcohol/week: 0.0 standard drinks   Drug use: No    Home Medications Prior to Admission medications   Medication Sig Start Date End Date Taking? Authorizing Provider  amLODipine (NORVASC) 2.5 MG tablet Take 1 tablet (2.5 mg total) by mouth daily. 03/07/21   Daphine Deutscher [name], FNP  Carboxymethylcellul-Glycerin (CLEAR EYES FOR DRY EYES OP) Place 1-2 drops into both eyes daily as needed.    [provider]  Cholecalciferol (VITAMIN D PO) Take by mouth daily. Patient not taking: No sig reported    [provider]  clindamycin (CLEOCIN-T) 1 % external solution Apply topically 2 (two) times daily. 03/16/21   Patton Salles, MD  cyanocobalamin (,VITAMIN B-12,) 1000 MCG/ML injection Inject 1,000 mcg into the muscle every 30 (  thirty) days.    [provider]  doxycycline (VIBRAMYCIN) 100 MG capsule Take 1 capsule (100 mg total) by mouth 2 (two) times daily. Take BID for 7 days.  Take with food as can cause GI distress. 03/16/21   Patton Salles, MD  fexofenadine (ALLEGRA) 180 MG tablet TAKE 1 TABLET BY MOUTH EVERY DAY 01/20/21   Bennie Pierini, FNP  fluticasone [facility]) 50 MCG/ACT nasal spray SPRAY 2 SPRAYS INTO EACH NOSTRIL EVERY DAY 01/05/20   Daphine Deutscher, [name], FNP  hydrochlorothiazide (HYDRODIURIL) 25 MG tablet Take 1 tablet (25 mg total) by mouth daily. 03/07/21   Daphine Deutscher, [name], FNP  KRILL OIL PO Take 350 mg by mouth daily.     [provider]  metoprolol tartrate (LOPRESSOR) 25 MG tablet Take 0.5 tablets (12.5 mg total) by mouth 2 (two) times daily as needed (palpitations). 03/07/21 06/05/21  Daphine Deutscher, [name], FNP  naproxen (NAPROSYN) 500 MG tablet TAKE 1 TABLET BY  MOUTH 2 TIMES DAILY WITH A MEAL. 04/07/21   Daphine Deutscher, [name], FNP  omeprazole (PRILOSEC) 20 MG capsule TAKE 1 CAPSULE BY MOUTH EVERY DAY 03/07/21   Daphine Deutscher, [name], FNP  Probiotic Product (TRUBIOTICS PO) Take by mouth daily.    [provider]  Vitamin D, Ergocalciferol, (DRISDOL) 1.25 MG (50000 UNIT) CAPS capsule Take one caps po every 2 weeks for 3 months. 03/17/21   Patton Salles, MD    Allergies    Other, Peanut-containing drug products, Eggs or egg-derived products, Fluarix [influenza virus vaccine], Amoxicillin, Penicillins, and Sulfa antibiotics  Review of Systems   Review of Systems  Constitutional:  Negative for fever.  HENT:  Negative for sore throat.   Eyes:  Negative for visual disturbance.  Respiratory:  Negative for shortness of breath.   Cardiovascular:  Positive for near-syncope. Negative for chest pain.  Gastrointestinal:  Negative for abdominal pain.  Genitourinary:  Negative for dysuria.  Musculoskeletal:  Negative for back pain.  Skin:  Negative for rash.  Neurological:  Positive for light-headedness and headaches.   Physical Exam Updated Vital Signs BP (!) 143/85   Pulse 87   Temp 98.7 F (37.1 C) (Oral)   Resp 18   Ht 5\' 5"  (1.651 m)   Wt 131.5 kg   SpO2 99%   BMI 48.26 kg/m   Physical Exam Vitals and nursing note reviewed.  Constitutional:      General: She is not in acute distress.    Appearance: Normal appearance. She is well-developed.  HENT:     Head: Normocephalic and atraumatic.  Eyes:     Conjunctiva/sclera: Conjunctivae normal.  Cardiovascular:     Rate and Rhythm: Normal rate and regular rhythm.     Heart sounds: No murmur heard. Pulmonary:     Effort: Pulmonary effort is normal. No respiratory distress.     Breath sounds: Normal breath sounds.  Abdominal:     Palpations: Abdomen is soft.     Tenderness: There is no abdominal tenderness.  Musculoskeletal:        General: No deformity or signs of injury.  Normal range of motion.     Cervical back: Neck supple.  Skin:    General: Skin is warm and dry.  Neurological:     General: No focal deficit present.     Mental Status: She is alert and oriented to person, place, and time.     Cranial Nerves: No cranial nerve deficit.     Sensory: No sensory deficit.  Motor: No weakness.    ED Results / Procedures / Treatments   Labs (all labs ordered are listed, but only abnormal results are displayed) Labs Reviewed  BASIC METABOLIC PANEL - Abnormal; Notable for the following components:      Result Value   Potassium 3.2 (*)    Glucose, Bld 126 (*)    All other components within normal limits  CBG MONITORING, ED - Abnormal; Notable for the following components:   Glucose-Capillary 124 (*)    All other components within normal limits  CBC  URINALYSIS, ROUTINE W REFLEX MICROSCOPIC    EKG EKG Interpretation  Date/Time:  Sunday May 08 2021 17:40:02 EDT Ventricular Rate:  111 PR Interval:  154 QRS Duration: 80 QT Interval:  246 QTC Calculation: 334 R Axis:   -16 Text Interpretation: Sinus tachycardia with occasional Premature ventricular complexes Left ventricular hypertrophy with repolarization abnormality ( R in aVL ) Abnormal ECG rate faster than prior 10/21 Confirmed by Meridee Score [phone]) on 05/08/2021 5:50:35 PM  Radiology No results found.  Procedures Procedures   Medications Ordered in ED Medications  potassium chloride SA (KLOR-CON) CR tablet 20 mEq (20 mEq Oral Given 05/08/21 2249)    ED Course  I have reviewed the triage vital signs and the nursing notes.  Pertinent labs & imaging results that were available during my care of the patient were reviewed by me and considered in my medical decision making (see chart for details).    MDM Rules/Calculators/A&P                         This patient complains of lightheadedness near syncope, tachycardia; this involves an extensive number of treatment Options and is a  complaint that carries with it a high risk of complications and Morbidity. The differential includes arrhythmia including SVT, VT, A. fib, metabolic derangement, anemia, POTS  I ordered, reviewed and interpreted labs, which included CBC with normal white count normal hemoglobin, chemistries normal other than mildly low potassium repleted I ordered medication oral potassium Additional history obtained from patient's family member Previous records obtained and reviewed in epic following along with Dr. Wyline Mood cardiology  After the interventions stated above, I reevaluated the patient and found patient to be asymptomatic tachycardia resolved.  Demented close follow-up with her treating providers.  Return instructions discussed   Final Clinical Impression(s) / ED Diagnoses Final diagnoses:  Near syncope    Rx / DC Orders ED Discharge Orders     None        Terrilee Files, MD 05/09/21 1016

## 2021-05-08 NOTE — Discharge Instructions (Addendum)
You were seen in the emergency department for feeling lightheaded with a rapid heart rate.  You had lab work and EKG that did not show any significant abnormalities.  Please follow-up with your cardiologist and your primary care doctor regarding continued work-up of your symptoms.  Return to the emergency department if any worsening or concerning symptoms

## 2021-05-25 ENCOUNTER — Telehealth: Payer: Self-pay | Admitting: Nurse Practitioner

## 2021-05-26 ENCOUNTER — Other Ambulatory Visit: Payer: Self-pay | Admitting: Nurse Practitioner

## 2021-05-26 DIAGNOSIS — R42 Dizziness and giddiness: Secondary | ICD-10-CM

## 2021-05-26 NOTE — Progress Notes (Signed)
Ref made to neurlogy

## 2021-05-30 ENCOUNTER — Ambulatory Visit: Payer: Self-pay

## 2021-06-01 ENCOUNTER — Other Ambulatory Visit: Payer: Self-pay | Admitting: Obstetrics and Gynecology

## 2021-06-08 ENCOUNTER — Other Ambulatory Visit: Payer: Self-pay

## 2021-06-08 ENCOUNTER — Ambulatory Visit (INDEPENDENT_AMBULATORY_CARE_PROVIDER_SITE_OTHER): Payer: 59 | Admitting: *Deleted

## 2021-06-08 DIAGNOSIS — E538 Deficiency of other specified B group vitamins: Secondary | ICD-10-CM | POA: Diagnosis not present

## 2021-06-09 ENCOUNTER — Other Ambulatory Visit: Payer: 59

## 2021-06-09 DIAGNOSIS — R7989 Other specified abnormal findings of blood chemistry: Secondary | ICD-10-CM

## 2021-06-09 LAB — VITAMIN D 25 HYDROXY (VIT D DEFICIENCY, FRACTURES): Vit D, 25-Hydroxy: 31 ng/mL (ref 30–100)

## 2021-06-21 ENCOUNTER — Ambulatory Visit (INDEPENDENT_AMBULATORY_CARE_PROVIDER_SITE_OTHER): Payer: 59 | Admitting: Cardiology

## 2021-06-21 ENCOUNTER — Encounter: Payer: Self-pay | Admitting: Cardiology

## 2021-06-21 VITALS — BP 132/86 | HR 72 | Ht 66.0 in | Wt 302.4 lb

## 2021-06-21 DIAGNOSIS — R42 Dizziness and giddiness: Secondary | ICD-10-CM | POA: Diagnosis not present

## 2021-06-21 DIAGNOSIS — I10 Essential (primary) hypertension: Secondary | ICD-10-CM

## 2021-06-21 DIAGNOSIS — R002 Palpitations: Secondary | ICD-10-CM

## 2021-06-21 NOTE — Progress Notes (Signed)
Clinical Summary [name] is a 50 y.o.female seen today for follow up of the following medical problems.    1. Dizziness - long history of symptoms  can come on with reading or watching tv, turning her head certain ways. Bad episodes can last several hours.   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 Was standing at sink. Felt lightheaded/dizzy like may pass out. Sitting felt better. 2nd that day sitting watching tv, similar episode. Shorter in duration. No other associated symptoms - similar to her prior episodes, but this one did not have headache - had some improvement with vestibular rehab    2. PVCs - occasional palpitaitons, mainly with laying down to go to sleep. Fairly mild. Can occur with stress - no coffee, occasional tea, 1 soda a nigh MTN Dew, no EtoH - normal K, Mg, normal TSH - over the last month can have some postional back and chest pain. Heaviest activity is moving heavy boxes at work with some mild fatigue.  - previous monitor in 2003/2004.      10/2020 nuclear stress no ischemia 10/2020 echo :LVEF 55-60% Jan 2022 14 day monitor: 6.3% PVCs, rare short runs of SVT   - rare infrequent palpitations    3. Chest pressure/DOE - 10/2020 nuclear stress no ischemia 10/2020 echo :LVEF 55-60%   - no recent symptoms     4. HTN - compliant with meds   Past Medical History:  Diagnosis Date   Anemia    Balance disorder    GERD (gastroesophageal reflux disease)    Low vitamin D level 2022   MVP (mitral valve prolapse)    PVC's (premature ventricular contractions)      Allergies  Allergen Reactions   Eggs Or Egg-Derived Products Swelling    Swells mouth with itching   Fluarix [Influenza Virus Vaccine] Swelling    Contains egg protein.   Other  Anaphylaxis    All Tree-Nuts   Peanut-Containing Drug Products Anaphylaxis   Penicillins Rash and Anaphylaxis    .Has patient had a PCN reaction causing immediate rash, facial/tongue/throat swelling, SOB or lightheadedness with hypotension: Yes Has patient had a PCN reaction causing severe rash involving mucus membranes or skin necrosis: No Has patient had a PCN reaction that required hospitalization: no Has patient had a PCN reaction occurring within the last 10 years: no If all of the above answers are "NO", then may proceed with Cephalosporin use.  Other reaction(s): Other (See Comments) .Has patient had a PCN reaction causing immediate rash, facial/tongue/throat swelling, SOB or lightheadedness with hypotension: Yes Has patient had a PCN reaction causing severe rash involving mucus membranes or skin necrosis: No Has patient had a PCN reaction that required hospitalization: no Has patient had a PCN reaction occurring within the last 10 years: no If all of the above answers are "NO", then may proceed with Cephalosporin use.   Amoxicillin Rash   Sulfa Antibiotics Rash    numbness     Current Outpatient Medications  Medication Sig Dispense Refill   amLODipine (NORVASC) 2.5 MG tablet Take 1 tablet (2.5 mg total) by mouth daily. 90 tablet 1   Carboxymethylcellul-Glycerin (CLEAR EYES FOR DRY EYES OP) Place 1-2 drops into both eyes daily as needed (dry eyes).  clindamycin (CLEOCIN-T) 1 % external solution Apply topically 2 (two) times daily. 30 mL 1   fexofenadine (ALLEGRA) 180 MG tablet TAKE 1 TABLET BY MOUTH EVERY DAY (Patient taking differently: Take 180 mg by mouth daily.) 90 tablet 1   fluticasone (FLONASE) 50 MCG/ACT nasal spray SPRAY 2 SPRAYS INTO EACH NOSTRIL EVERY DAY (Patient taking differently: Place 2 sprays into both nostrils daily as needed for allergies.) 48 mL 1   hydrochlorothiazide (HYDRODIURIL) 25 MG tablet Take 1 tablet (25 mg total) by mouth daily. 90 tablet 1    KRILL OIL PO Take 350 mg by mouth daily.      metoprolol tartrate (LOPRESSOR) 25 MG tablet Take 0.5 tablets (12.5 mg total) by mouth 2 (two) times daily as needed (palpitations). 180 tablet 1   naproxen (NAPROSYN) 500 MG tablet TAKE 1 TABLET BY MOUTH 2 TIMES DAILY WITH A MEAL. (Patient taking differently: Take 500 mg by mouth 2 (two) times daily with a meal.) 60 tablet 1   nortriptyline (PAMELOR) 10 MG capsule Take 10 mg by mouth at bedtime.     omeprazole (PRILOSEC) 20 MG capsule TAKE 1 CAPSULE BY MOUTH EVERY DAY (Patient taking differently: Take 20 mg by mouth daily. TAKE 1 CAPSULE BY MOUTH EVERY DAY) 90 capsule 1   Vitamin D, Ergocalciferol, (DRISDOL) 1.25 MG (50000 UNIT) CAPS capsule Take one caps po every 2 weeks for 3 months. (Patient taking differently: Take 50,000 Units by mouth every 14 (fourteen) days.) 6 capsule 0   Current Facility-Administered Medications  Medication Dose Route Frequency Provider Last Rate Last Admin   cyanocobalamin ((VITAMIN B-12)) injection 1,000 mcg  1,000 mcg Intramuscular Q30 days Bennie Pierini, FNP   1,000 mcg at 06/08/21 4970     No past surgical history on file.   Allergies  Allergen Reactions   Eggs Or Egg-Derived Products Swelling    Swells mouth with itching   Fluarix [Influenza Virus Vaccine] Swelling    Contains egg protein.   Other Anaphylaxis    All Tree-Nuts   Peanut-Containing Drug Products Anaphylaxis   Penicillins Rash and Anaphylaxis    .Has patient had a PCN reaction causing immediate rash, facial/tongue/throat swelling, SOB or lightheadedness with hypotension: Yes Has patient had a PCN reaction causing severe rash involving mucus membranes or skin necrosis: No Has patient had a PCN reaction that required hospitalization: no Has patient had a PCN reaction occurring within the last 10 years: no If all of the above answers are "NO", then may proceed with Cephalosporin use.  Other reaction(s): Other (See Comments) .Has patient  had a PCN reaction causing immediate rash, facial/tongue/throat swelling, SOB or lightheadedness with hypotension: Yes Has patient had a PCN reaction causing severe rash involving mucus membranes or skin necrosis: No Has patient had a PCN reaction that required hospitalization: no Has patient had a PCN reaction occurring within the last 10 years: no If all of the above answers are "NO", then may proceed with Cephalosporin use.   Amoxicillin Rash   Sulfa Antibiotics Rash    numbness      Family History  Problem Relation Age of Onset   Hypertension Mother    Diabetes Mother    Other Mother        renal disease   Hypertension Father    Heart attack Father    Hypertension Sister    Diabetes Brother    Diabetes Brother      Social History Ms. Reaney reports that she has never smoked. She has  never used smokeless tobacco. Ms. Stoney reports no history of alcohol use.   Review of Systems CONSTITUTIONAL: No weight loss, fever, chills, weakness or fatigue.  HEENT: Eyes: No visual loss, blurred vision, double vision or yellow sclerae.No hearing loss, sneezing, congestion, runny nose or sore throat.  SKIN: No rash or itching.  CARDIOVASCULAR: per hpi RESPIRATORY: No shortness of breath, cough or sputum.  GASTROINTESTINAL: No anorexia, nausea, vomiting or diarrhea. No abdominal pain or blood.  GENITOURINARY: No burning on urination, no polyuria NEUROLOGICAL: No headache, dizziness, syncope, paralysis, ataxia, numbness or tingling in the extremities. No change in bowel or bladder control.  MUSCULOSKELETAL: No muscle, back pain, joint pain or stiffness.  LYMPHATICS: No enlarged nodes. No history of splenectomy.  PSYCHIATRIC: No history of depression or anxiety.  ENDOCRINOLOGIC: No reports of sweating, cold or heat intolerance. No polyuria or polydipsia.  Marland Kitchen   Physical Examination Today's Vitals   06/21/21 0831  BP: 132/86  Pulse: 72  SpO2: 98%  Weight: (!) 302 lb 6.4 oz (137.2 kg)   Height: 5\' 6"  (1.676 m)   Body mass index is 48.81 kg/m.  Gen: resting comfortably, no acute distress HEENT: no scleral icterus, pupils equal round and reactive, no palptable cervical adenopathy,  CV: RRR, no m/r/g no jvd Resp: Clear to auscultation bilaterally GI: abdomen is soft, non-tender, non-distended, normal bowel sounds, no hepatosplenomegaly MSK: extremities are warm, no edema.  Skin: warm, no rash Neuro:  no focal deficits Psych: appropriate affect   Diagnostic Studies 08/2017 holter Sinus rhythm  Rates 53 to 114 bpm  Average HR 76 bpm  Frequent PVCs  (13% total)  12 couplets, 5 triplets       10/2020 echo 1. Left ventricular ejection fraction, by estimation, is 55 to 60%. The  left ventricle has normal function. The left ventricle has no regional  wall motion abnormalities. There is mild left ventricular hypertrophy.  Left ventricular diastolic parameters  are indeterminate.   2. Right ventricular systolic function is normal. The right ventricular  size is normal. Tricuspid regurgitation signal is inadequate for assessing  PA pressure.   3. The mitral valve is grossly normal. Trivial mitral valve  regurgitation.   4. The aortic valve is tricuspid. Aortic valve regurgitation is not  visualized.   5. The inferior vena cava is normal in size with greater than 50%  respiratory variability, suggesting right atrial pressure of 3 mmHg.      Jan 2022 14 day monitor Predominant rhythm is normal sinus rhythm Rare supraventricular ectopy Frequent ventricular ectopy primarily as isolated PVCs (6.3%). Symptoms correlated with sinus rhythm, PVCs.   14 day monitor. Patient had a min HR of 50 bpm, max HR of 197 bpm, and avg HR of 73 bpm. Predominant underlying rhythm was Sinus Rhythm. 2 Supraventricular Tachycardia runs occurred, the run with the fastest interval lasting 5 beats with a max rate of 197 bpm, the longest lasting 6 beats with an avg rate of 103 bpm. Isolated  SVEs were rare (<1.0%), and no SVE Couplets or SVE Triplets were present. Isolated VEs were frequent (6.3%, 92933), VE Couplets were rare (<1.0%, 3), and VE Triplets were rare (<1.0%, 1). Ventricular Bigeminy and Trigeminy were present.     10/2020 nuclear stress No diagnostic ST segment changes. Frequent PVCs were noted. Small, mild intensity, mid apical anteroseptal defect that is partially reversible and consistent with variable soft tissue attenuation. Summed stress score is only 1. This is a low risk study based on perfusion  imaging. Nuclear stress EF: 19%. Calculation appears to be inaccurate, possibly affected by frequent PVCs. Suggest echocardiogram to correlate.      Assessment and Plan  1. PVCs - no significant symptoms, continue prn lopressor   2. HTN - at goal, continue current meds   3. Dizziness -history and prior testing not consistent with cardiac etiology - f/u with neuro, from notes thought potentially a vestibular origin perhaps vestibular migraines   4. Hypokalemia - noted during recent ER visit, repeat labs - if ongoing add daily KCl supplement vs changing to maxide, likely etiology is her HCTZ    Antoine Poche, M.D.

## 2021-06-21 NOTE — Patient Instructions (Signed)
Medication Instructions:  Continue all current medications.   Labwork: BMET, Mg - orders given today.  Office will contact with results via phone or letter.    Testing/Procedures: none  Follow-Up: Your physician wants you to follow up in:  1 year.  You will receive a reminder letter in the mail one-two months in advance.  If you don't receive a letter, please call our office to schedule the follow up appointment.    Any Other Special Instructions Will Be Listed Below (If Applicable).    If you need a refill on your cardiac medications before your next appointment, please call your pharmacy.

## 2021-06-22 ENCOUNTER — Other Ambulatory Visit: Payer: 59

## 2021-06-22 ENCOUNTER — Other Ambulatory Visit: Payer: Self-pay

## 2021-06-23 LAB — BASIC METABOLIC PANEL
BUN/Creatinine Ratio: 18 (ref 9–23)
BUN: 14 mg/dL (ref 6–24)
CO2: 23 mmol/L (ref 20–29)
Calcium: 9.9 mg/dL (ref 8.7–10.2)
Chloride: 99 mmol/L (ref 96–106)
Creatinine, Ser: 0.79 mg/dL (ref 0.57–1.00)
Glucose: 98 mg/dL (ref 65–99)
Potassium: 4.3 mmol/L (ref 3.5–5.2)
Sodium: 137 mmol/L (ref 134–144)
eGFR: 90 mL/min/{1.73_m2} (ref >59)

## 2021-06-23 LAB — MAGNESIUM: Magnesium: 2.1 mg/dL (ref 1.6–2.3)

## 2021-07-05 ENCOUNTER — Telehealth: Payer: Self-pay | Admitting: *Deleted

## 2021-07-05 NOTE — Telephone Encounter (Signed)
-----   Message from Antoine Poche, MD sent at 07/01/2021 12:30 PM EDT ----- Normal labs  Dominga Ferry MD

## 2021-07-05 NOTE — Telephone Encounter (Signed)
Lesle Chris, LPN  05/02/349  3:42 PM EDT Back to Top    Notified, copy to pcp.

## 2021-07-11 ENCOUNTER — Other Ambulatory Visit: Payer: Self-pay | Admitting: Nurse Practitioner

## 2021-07-11 DIAGNOSIS — I10 Essential (primary) hypertension: Secondary | ICD-10-CM

## 2021-07-13 ENCOUNTER — Ambulatory Visit: Payer: Self-pay

## 2021-07-15 ENCOUNTER — Ambulatory Visit (INDEPENDENT_AMBULATORY_CARE_PROVIDER_SITE_OTHER): Payer: 59

## 2021-07-15 DIAGNOSIS — E538 Deficiency of other specified B group vitamins: Secondary | ICD-10-CM | POA: Diagnosis not present

## 2021-07-15 NOTE — Progress Notes (Signed)
Cyanocobalamin injection given to right deltoid.  Patient tolerated well. 

## 2021-07-30 ENCOUNTER — Other Ambulatory Visit: Payer: Self-pay | Admitting: Nurse Practitioner

## 2021-07-30 DIAGNOSIS — I10 Essential (primary) hypertension: Secondary | ICD-10-CM

## 2021-08-17 ENCOUNTER — Other Ambulatory Visit: Payer: Self-pay

## 2021-08-17 ENCOUNTER — Ambulatory Visit (INDEPENDENT_AMBULATORY_CARE_PROVIDER_SITE_OTHER): Payer: 59 | Admitting: Family Medicine

## 2021-08-17 DIAGNOSIS — E538 Deficiency of other specified B group vitamins: Secondary | ICD-10-CM

## 2021-08-21 ENCOUNTER — Other Ambulatory Visit: Payer: Self-pay | Admitting: Nurse Practitioner

## 2021-08-21 DIAGNOSIS — K219 Gastro-esophageal reflux disease without esophagitis: Secondary | ICD-10-CM

## 2021-08-22 ENCOUNTER — Telehealth: Payer: Self-pay | Admitting: Neurology

## 2021-08-22 ENCOUNTER — Ambulatory Visit: Payer: 59 | Admitting: Neurology

## 2021-08-22 NOTE — Telephone Encounter (Signed)
NP appt cancelled due to Dr. Frances Furbish calling out sick.

## 2021-09-07 ENCOUNTER — Other Ambulatory Visit: Payer: Self-pay

## 2021-09-07 ENCOUNTER — Ambulatory Visit: Payer: Self-pay | Admitting: Nurse Practitioner

## 2021-09-07 ENCOUNTER — Ambulatory Visit (INDEPENDENT_AMBULATORY_CARE_PROVIDER_SITE_OTHER): Payer: 59 | Admitting: Nurse Practitioner

## 2021-09-07 ENCOUNTER — Encounter: Payer: Self-pay | Admitting: Nurse Practitioner

## 2021-09-07 VITALS — BP 137/75 | HR 89 | Temp 98.4°F | Resp 20 | Ht 66.0 in | Wt 303.0 lb

## 2021-09-07 DIAGNOSIS — I10 Essential (primary) hypertension: Secondary | ICD-10-CM

## 2021-09-07 DIAGNOSIS — E7800 Pure hypercholesterolemia, unspecified: Secondary | ICD-10-CM | POA: Diagnosis not present

## 2021-09-07 DIAGNOSIS — K219 Gastro-esophageal reflux disease without esophagitis: Secondary | ICD-10-CM | POA: Diagnosis not present

## 2021-09-07 DIAGNOSIS — E538 Deficiency of other specified B group vitamins: Secondary | ICD-10-CM

## 2021-09-07 DIAGNOSIS — Z6841 Body Mass Index (BMI) 40.0 and over, adult: Secondary | ICD-10-CM

## 2021-09-07 DIAGNOSIS — R519 Headache, unspecified: Secondary | ICD-10-CM

## 2021-09-07 MED ORDER — HYDROCHLOROTHIAZIDE 25 MG PO TABS: 25.0000 mg | ORAL_TABLET | Freq: Every day | ORAL | 1 refills | Status: DC

## 2021-09-07 MED ORDER — METOPROLOL TARTRATE 25 MG PO TABS: 12.5000 mg | ORAL_TABLET | Freq: Two times a day (BID) | ORAL | 1 refills | Status: DC | PRN

## 2021-09-07 MED ORDER — OMEPRAZOLE 20 MG PO CPDR: 20.0000 mg | DELAYED_RELEASE_CAPSULE | Freq: Every day | ORAL | 1 refills | Status: DC

## 2021-09-07 MED ORDER — AMLODIPINE BESYLATE 2.5 MG PO TABS
2.5000 mg | ORAL_TABLET | Freq: Every day | ORAL | 1 refills | Status: DC
Start: 2021-09-07 — End: 2022-03-10

## 2021-09-07 MED ORDER — NORTRIPTYLINE HCL 10 MG PO CAPS: 10.0000 mg | ORAL_CAPSULE | Freq: Every day | ORAL | 1 refills | Status: DC

## 2021-09-07 MED ORDER — ROSUVASTATIN CALCIUM 10 MG PO TABS: 10.0000 mg | ORAL_TABLET | Freq: Every day | ORAL | 1 refills | Status: DC

## 2021-09-07 NOTE — Patient Instructions (Signed)

## 2021-09-07 NOTE — Progress Notes (Signed)
Subjective:    Patient ID: Renee Thomas, female    DOB: July 02, 1971, 50 y.o.   MRN: [id number]  Chief Complaint: Medical Management of Chronic Issues    HPI:  1. Primary hypertension No c/o chest pain sob or headache. Does not check blood pressure at home. BP Readings from Last 3 Encounters:  09/07/21 137/75  06/21/21 132/86  05/08/21 109/67   2 hyperlipidemia Does not watch diet and does no dedicated exercise. Is currently not on a statin. The 10-year ASCVD risk score (Arnett DK, et al., 2019) is: 4.9%  Lab Results  Component Value Date   CHOL 208 (H) 03/07/2021   HDL 59 03/07/2021   LDLCALC 135 (H) 03/07/2021   TRIG 81 03/07/2021   CHOLHDL 3.5 03/07/2021    3. GERD without esophagitis Is on omperazole daily and is doing well  3. Low serum vitamin B12 Is on b12 injections monthly Lab Results  Component Value Date   VITAMINB12 1,189 09/10/2020     4. BMI 45.0-49.9, adult ([place]) Weight is up 13 lbs since July Wt Readings from Last 3 Encounters:  09/07/21 (!) 303 lb (137.4 kg)  06/21/21 (!) 302 lb 6.4 oz (137.2 kg)  05/08/21 290 lb (131.5 kg)   BMI Readings from Last 3 Encounters:  09/07/21 48.91 kg/m  06/21/21 48.81 kg/m  05/08/21 48.26 kg/m       Outpatient Encounter Medications as of 09/07/2021  Medication Sig   amLODipine (NORVASC) 2.5 MG tablet Take 1 tablet (2.5 mg total) by mouth daily.   Carboxymethylcellul-Glycerin (CLEAR EYES FOR DRY EYES OP) Place 1-2 drops into both eyes daily as needed (dry eyes).   Cholecalciferol (VITAMIN D3) 50 MCG (2000 UT) TABS Take 1 tablet by mouth daily.   clindamycin (CLEOCIN-T) 1 % external solution Apply topically 2 (two) times daily.   fexofenadine (ALLEGRA) 180 MG tablet TAKE 1 TABLET BY MOUTH EVERY DAY   fluticasone (FLONASE) 50 MCG/ACT nasal spray SPRAY 2 SPRAYS INTO EACH NOSTRIL EVERY DAY (Patient taking differently: Place 2 sprays into both nostrils daily as needed for allergies.)   hydrochlorothiazide  (HYDRODIURIL) 25 MG tablet TAKE 1 TABLET (25 MG TOTAL) BY MOUTH DAILY.   KRILL OIL PO Take 350 mg by mouth daily.    metoprolol tartrate (LOPRESSOR) 25 MG tablet Take 0.5 tablets (12.5 mg total) by mouth 2 (two) times daily as needed (palpitations).   naproxen (NAPROSYN) 500 MG tablet TAKE 1 TABLET BY MOUTH 2 TIMES DAILY WITH A MEAL. (Patient taking differently: Take 500 mg by mouth 2 (two) times daily with a meal.)   nortriptyline (PAMELOR) 10 MG capsule Take 10 mg by mouth at bedtime.   omeprazole (PRILOSEC) 20 MG capsule TAKE 1 CAPSULE BY MOUTH EVERY DAY   Facility-Administered Encounter Medications as of 09/07/2021  Medication   cyanocobalamin ((VITAMIN B-12)) injection 1,000 mcg    Past Surgical History:  Procedure Laterality Date   NO SURGERIES      Family History  Problem Relation Age of Onset   Hypertension Mother    Diabetes Mother    Other Mother        renal disease   Hypertension Father    Heart attack Father    Hypertension Sister    Diabetes Brother    Diabetes Brother     New complaints: still having vertigo. Her appointment with neurology is December 1st. She says she feelslike she has constant pressure in head. No headache just pressure feeling  Social history: Lives with  her sister  Controlled substance contract: n/a     Review of Systems  Constitutional:  Negative for diaphoresis.  Eyes:  Negative for pain.  Respiratory:  Negative for shortness of breath.   Cardiovascular:  Negative for chest pain, palpitations and leg swelling.  Gastrointestinal:  Negative for abdominal pain.  Endocrine: Negative for polydipsia.  Skin:  Negative for rash.  Neurological:  Positive for dizziness. Negative for weakness and headaches.  Hematological:  Does not bruise/bleed easily.  All other systems reviewed and are negative.     Objective:   Physical Exam Vitals and nursing note reviewed.  Constitutional:      General: She is not in acute distress.     Appearance: Normal appearance. She is well-developed.  HENT:     Head: Normocephalic.     Right Ear: Tympanic membrane normal.     Left Ear: Tympanic membrane normal.     Nose: Nose normal.     Mouth/Throat:     Mouth: Mucous membranes are moist.  Eyes:     Pupils: Pupils are equal, round, and reactive to light.  Neck:     Vascular: No carotid bruit or JVD.  Cardiovascular:     Rate and Rhythm: Normal rate and regular rhythm.     Heart sounds: Normal heart sounds.  Pulmonary:     Effort: Pulmonary effort is normal. No respiratory distress.     Breath sounds: Normal breath sounds. No wheezing or rales.  Chest:     Chest wall: No tenderness.  Abdominal:     General: Bowel sounds are normal. There is no distension or abdominal bruit.     Palpations: Abdomen is soft. There is no hepatomegaly, splenomegaly, mass or pulsatile mass.     Tenderness: There is no abdominal tenderness.  Musculoskeletal:        General: Normal range of motion.     Cervical back: Normal range of motion and neck supple.  Lymphadenopathy:     Cervical: No cervical adenopathy.  Skin:    General: Skin is warm and dry.  Neurological:     Mental Status: She is alert and oriented to person, place, and time.     Deep Tendon Reflexes: Reflexes are normal and symmetric.  Psychiatric:        Behavior: Behavior normal.        Thought Content: Thought content normal.        Judgment: Judgment normal.   BP 137/75   Pulse 89   Temp 98.4 F (36.9 C) (Temporal)   Resp 20   Ht _0  (1.676 m)   Wt (!) 303 lb (137.4 kg)   SpO2 100%   BMI 48.91 kg/m         Assessment & Plan:  Renee Thomas comes in today with chief complaint of Medical Management of Chronic Issues   Diagnosis and orders addressed:  1. Primary hypertension Low sodium diet - hydrochlorothiazide (HYDRODIURIL) 25 MG tablet; Take 1 tablet (25 mg total) by mouth daily.  Dispense: 90 tablet; Refill: 1 - amLODipine (NORVASC) 2.5 MG tablet;  Take 1 tablet (2.5 mg total) by mouth daily.  Dispense: 90 tablet; Refill: 1 - metoprolol tartrate (LOPRESSOR) 25 MG tablet; Take 0.5 tablets (12.5 mg total) by mouth 2 (two) times daily as needed (palpitations).  Dispense: 180 tablet; Refill: 1 - CBC with Differential/Platelet - CMP14+EGFR  2. Pure hypercholesterolemia Low fat diet - rosuvastatin (CRESTOR) 10 MG tablet; Take 1 tablet (10 mg total) by  mouth daily.  Dispense: 90 tablet; Refill: 1 - Lipid panel  3. GERD without esophagitis Avoid spicy foods Do not eat 2 hours prior to bedtime - omeprazole (PRILOSEC) 20 MG capsule; Take 1 capsule (20 mg total) by mouth daily.  Dispense: 90 capsule; Refill: 1  4. Low serum vitamin B12 Continue monthly vitamin d supplement  5. BMI 45.0-49.9, adult ([place]) Discussed diet and exercise for person with BMI >25 Will recheck weight in 3-6 months   6. Pressure in head Keep follow up appointment with  neurology - nortriptyline (PAMELOR) 10 MG capsule; Take 1 capsule (10 mg total) by mouth at bedtime.  Dispense: 90 capsule; Refill: 1   Labs pending Health Maintenance reviewed Diet and exercise encouraged  Follow up plan: 6 months   [name] Hassell Done, FNP

## 2021-09-08 LAB — CBC WITH DIFFERENTIAL/PLATELET
Basophils Absolute: 0 10*3/uL (ref 0.0–0.2)
Basos: 1 %
EOS (ABSOLUTE): 0.1 10*3/uL (ref 0.0–0.4)
Eos: 2 %
Hematocrit: 40.2 % (ref 34.0–46.6)
Hemoglobin: 13.1 g/dL (ref 11.1–15.9)
Immature Grans (Abs): 0 10*3/uL (ref 0.0–0.1)
Immature Granulocytes: 0 %
Lymphocytes Absolute: 2 10*3/uL (ref 0.7–3.1)
Lymphs: 35 %
MCH: 27.5 pg (ref 26.6–33.0)
MCHC: 32.6 g/dL (ref 31.5–35.7)
MCV: 84 fL (ref 79–97)
Monocytes Absolute: 0.5 10*3/uL (ref 0.1–0.9)
Monocytes: 9 %
Neutrophils Absolute: 3.1 10*3/uL (ref 1.4–7.0)
Neutrophils: 53 %
Platelets: 373 10*3/uL (ref 150–450)
RBC: 4.77 x10E6/uL (ref 3.77–5.28)
RDW: 13.7 % (ref 11.7–15.4)
WBC: 5.8 10*3/uL (ref 3.4–10.8)

## 2021-09-08 LAB — CMP14+EGFR
ALT: 23 IU/L (ref 0–32)
AST: 20 IU/L (ref 0–40)
Albumin/Globulin Ratio: 1.5 (ref 1.2–2.2)
Albumin: 4.7 g/dL (ref 3.8–4.9)
Alkaline Phosphatase: 137 IU/L — ABNORMAL HIGH (ref 44–121)
BUN/Creatinine Ratio: 18 (ref 9–23)
BUN: 15 mg/dL (ref 6–24)
Bilirubin Total: 0.6 mg/dL (ref 0.0–1.2)
CO2: 23 mmol/L (ref 20–29)
Calcium: 10 mg/dL (ref 8.7–10.2)
Chloride: 99 mmol/L (ref 96–106)
Creatinine, Ser: 0.84 mg/dL (ref 0.57–1.00)
Globulin, Total: 3.2 g/dL (ref 1.5–4.5)
Glucose: 103 mg/dL — ABNORMAL HIGH (ref 70–99)
Potassium: 4.1 mmol/L (ref 3.5–5.2)
Sodium: 137 mmol/L (ref 134–144)
Total Protein: 7.9 g/dL (ref 6.0–8.5)
eGFR: 84 mL/min/{1.73_m2} (ref >59)

## 2021-09-08 LAB — LIPID PANEL
Chol/HDL Ratio: 3.7 ratio (ref 0.0–4.4)
Cholesterol, Total: 192 mg/dL (ref 100–199)
HDL: 52 mg/dL (ref >39)
LDL Chol Calc (NIH): 127 mg/dL — ABNORMAL HIGH (ref 0–99)
Triglycerides: 68 mg/dL (ref 0–149)
VLDL Cholesterol Cal: 13 mg/dL (ref 5–40)

## 2021-09-19 ENCOUNTER — Other Ambulatory Visit: Payer: Self-pay

## 2021-09-19 ENCOUNTER — Ambulatory Visit (INDEPENDENT_AMBULATORY_CARE_PROVIDER_SITE_OTHER): Payer: 59 | Admitting: *Deleted

## 2021-09-19 DIAGNOSIS — E538 Deficiency of other specified B group vitamins: Secondary | ICD-10-CM

## 2021-09-19 NOTE — Progress Notes (Signed)
Vitamin b12 injection given and patient tolerated well.  

## 2021-09-23 ENCOUNTER — Ambulatory Visit (INDEPENDENT_AMBULATORY_CARE_PROVIDER_SITE_OTHER): Payer: 59

## 2021-09-23 ENCOUNTER — Other Ambulatory Visit: Payer: Self-pay

## 2021-09-23 DIAGNOSIS — Z23 Encounter for immunization: Secondary | ICD-10-CM | POA: Diagnosis not present

## 2021-10-06 ENCOUNTER — Encounter: Payer: Self-pay | Admitting: Neurology

## 2021-10-06 ENCOUNTER — Other Ambulatory Visit: Payer: Self-pay

## 2021-10-06 ENCOUNTER — Ambulatory Visit (INDEPENDENT_AMBULATORY_CARE_PROVIDER_SITE_OTHER): Payer: 59 | Admitting: Neurology

## 2021-10-06 ENCOUNTER — Telehealth: Payer: Self-pay | Admitting: Nurse Practitioner

## 2021-10-06 VITALS — BP 145/94 | HR 105 | Ht 65.0 in | Wt 305.6 lb

## 2021-10-06 DIAGNOSIS — R42 Dizziness and giddiness: Secondary | ICD-10-CM

## 2021-10-06 DIAGNOSIS — G4733 Obstructive sleep apnea (adult) (pediatric): Secondary | ICD-10-CM

## 2021-10-06 DIAGNOSIS — R030 Elevated blood-pressure reading, without diagnosis of hypertension: Secondary | ICD-10-CM

## 2021-10-06 DIAGNOSIS — R0683 Snoring: Secondary | ICD-10-CM

## 2021-10-06 DIAGNOSIS — R351 Nocturia: Secondary | ICD-10-CM

## 2021-10-06 DIAGNOSIS — E559 Vitamin D deficiency, unspecified: Secondary | ICD-10-CM

## 2021-10-06 DIAGNOSIS — I951 Orthostatic hypotension: Secondary | ICD-10-CM

## 2021-10-06 DIAGNOSIS — Z6841 Body Mass Index (BMI) 40.0 and over, adult: Secondary | ICD-10-CM

## 2021-10-06 DIAGNOSIS — R2689 Other abnormalities of gait and mobility: Secondary | ICD-10-CM

## 2021-10-06 DIAGNOSIS — R519 Headache, unspecified: Secondary | ICD-10-CM

## 2021-10-06 HISTORY — DX: Obstructive sleep apnea (adult) (pediatric): G47.33

## 2021-10-06 NOTE — Telephone Encounter (Signed)
Patient aware and verbalized understanding. °

## 2021-10-06 NOTE — Telephone Encounter (Signed)
Lmtcb.

## 2021-10-06 NOTE — Patient Instructions (Addendum)
Thank you for choosing [facility] for your neurological evaluation and sleep related care! It was nice to meet you today!  Unfortunately, dizziness or lightheadedness is a very common complaint but is often not due to a primary neurological reason or single underlying medical problem. Often, there a combination of factors, that result in dizziness. This includes blood pressure fluctuations, medication side effects, blood sugar fluctuations, stress, vertigo, poor sleep with sleep deprivation, dehydration, and electrolyte disturbance or other metabolic and endocrinological reasons, meaning hormone related problems such as thyroid dysfunction. We will investigate things further with a brain MRI. We will call you with the test results. I don't believe you actually have vertiginous or vestibular migraines.  Your symptoms do not sound like actual vertigo but are more nonspecific.  You do have a mild drop in your blood pressure when you stand up.  Based on your symptoms and your exam I believe you are at risk for obstructive sleep apnea (aka OSA), and I think we should proceed with a sleep study to determine whether you do or do not have OSA and how severe it is. Even, if you have mild OSA, I may want you to consider treatment with CPAP, as treatment of even borderline or mild sleep apnea can result and improvement of symptoms such as sleep disruption, daytime sleepiness, nighttime bathroom breaks, restless leg symptoms, improvement of headache syndromes, even improved mood disorder.  Please remember, the long-term risks and ramifications of untreated moderate to severe obstructive sleep apnea are: increased Cardiovascular disease, including congestive heart failure, stroke, difficult to control hypertension, treatment resistant obesity, arrhythmias, especially irregular heartbeat commonly known as A. Fib. (atrial fibrillation); even type 2 diabetes has been linked to untreated OSA.   Sleep  apnea can cause disruption of sleep and sleep deprivation in most cases, which, in turn, can cause recurrent headaches, problems with memory, mood, concentration, focus, and vigilance. Most people with untreated sleep apnea report excessive daytime sleepiness, which can affect their ability to drive. Please do not drive if you feel sleepy. Patients with sleep apnea can also develop difficulty initiating and maintaining sleep (aka insomnia).  Our sleep lab administrative assistant will call you to schedule your sleep study and give you further instructions, regarding the check in process for the sleep study, arrival time, what to bring, when you can expect to leave after the study, etc., and to answer any other logistical questions you may have. If you don't hear back from her by about 2 weeks from now, please feel free to call her direct line at [phone] or you can call our general clinic number, or email [place] through My Chart.   We will check blood work today and call you with the test results.  Please talk to your primary care about your blood pressure management, you may want to take your hydrochlorothiazide in the early part of the day.  Please continue to stay well-hydrated and change positions slowly.  Please also talk to your primary care about potentially seeing orthopedics as you do have a change in your posture and I suspect you may have a small degree of scoliosis.

## 2021-10-06 NOTE — Progress Notes (Signed)
Subjective:    Patient ID: Renee Thomas is a 50 y.o. female.  HPI    Star Age, MD, PhD [facility] [address] P.O. [place], [place] [address]  Dear [name],   I saw your patient, Renee Thomas, upon your kind request in my neurologic clinic today for initial consultation of her vertigo.  The patient is unaccompanied today.  As you know, Renee Thomas is a 50 year old right-handed woman with an underlying medical history of anemia, reflux disease, vitamin D deficiency, mitral valve prolapse, PVCs, vitamin B12 deficiency, and morbid obesity with a BMI of over 45, who reports an approximately 2-year history of intermittent lightheadedness and feeling off balance.  A couple of times she had to hold onto something but has not actually fallen thankfully.  She denies any actual spinning sensation, she was first suspected to have Mnire's disease by her first ENT and then was sent to another ENT for second opinion. She denies any history of migraines, no severe recurrent or one-sided headaches, no photophobia, no nausea or vomiting.  Has intermittent head pressure and has also woken up with a headache occasionally.  She does snore, mildly per sister.  She has nocturia about twice per average night.  She does not wake up rested and does endorse daytime tiredness.  She feels that her balance is not the same she feels insecure when walking down or when driving at night.  She is a little overdue for her eye exam, she has prescription progressive lenses.  She denies any sudden onset of one-sided weakness or numbness or tingling or droopy face or slurring of speech, has had intermittent left shoulder discomfort and decrease in range of motion.  Her sister has noticed that she leans to the left when she sits or stands.  She was recently evaluated by ENT and I was able to review the encounter note from 04/20/2021 when she saw Dr. Willette Pa.  Her vestibular testing was  benign.  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  She was started on nortriptyline at the time.  She had a head CT without contrast on 08/07/2020 and I reviewed the results: Impression: No acute intracranial abnormality.  She has had recurrent headaches.  She has not had a brain MRI.  She has not had a sleep study.  She had recent blood work on 09/07/2021 and I was able to review the results in her chart: Lipid panel showed total cholesterol 192, triglycerides 68, HDL 52, LDL 127, CBC with differential was benign, hemoglobin 13.1, hematocrit 40.2.  CMP showed glucose of 103, BUN 15, creatinine 0.84, alk phos mildly elevated at 137, AST 20, ALT 23.  Her vitamin D level on 06/09/2021 was borderline at 31, prior to that it was low at 15.  Her latest vitamin B12 level that I could see in her chart was from 09/10/2020 and was in the normal range at 1189.  She presented to the emergency room at [facility] on 05/08/2021 with 2 episodes of feeling lightheaded that day.  She felt like she was going to pass out.  I reviewed the emergency room records.  She had mild tachycardia but no significant abnormalities on blood testing or EKG.  She feels that the nortriptyline did help with her head pressure.  She has elevated blood pressure values and takes metoprolol as needed, hydrochlorothiazide in the evening and amlodipine in the evening.  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   She does not drink any alcohol, she does not drink any caffeine currently.  She is a non-smoker.  She has a nephew with sleep apnea.  Her Past Medical History Is Significant For: Past Medical History:  Diagnosis Date   Anemia    Balance disorder    GERD (gastroesophageal reflux disease)    Low vitamin D level 2022   MVP (mitral valve prolapse)    PVC's (premature ventricular contractions)     Her Past Surgical History Is Significant For: Past Surgical History:  Procedure Laterality Date   NO  SURGERIES      Her Family History Is Significant For: Family History  Problem Relation Age of Onset   Hypertension Mother    Diabetes Mother    Other Mother        renal disease   Hypertension Father    Heart attack Father    Hypertension Sister    Diabetes Brother    Diabetes Brother     Her Social History Is Significant For: Social History   Socioeconomic History   Marital status: Single    Spouse name: Not on file   Number of children: Not on file   Years of education: Not on file   Highest education level: Not on file  Occupational History   Not on file  Tobacco Use   Smoking status: Never   Smokeless tobacco: Never  Vaping Use   Vaping Use: Never used  Substance and Sexual Activity   Alcohol use: No    Alcohol/week: 0.0 standard drinks   Drug use: No   Sexual activity: Not Currently    Partners: Male    Birth control/protection: Abstinence  Other Topics Concern   Not on file  Social History Narrative   Not on file   Social Determinants of Health   Financial Resource Strain: Not on file  Food Insecurity: Not on file  Transportation Needs: Not on file  Physical Activity: Not on file  Stress: Not on file  Social Connections: Not on file    Her Allergies Are:  Allergies  Allergen Reactions   Eggs Or Egg-Derived Products Swelling    Swells mouth with itching   Fluarix [Influenza Virus Vaccine] Swelling    Contains egg protein.   Other Anaphylaxis    All Tree-Nuts   Peanut-Containing Drug Products Anaphylaxis   Penicillins Rash and Anaphylaxis    .Has patient had a PCN reaction causing immediate rash, facial/tongue/throat swelling, SOB or lightheadedness with hypotension: Yes Has patient had a PCN reaction causing severe rash involving mucus membranes or skin necrosis: No Has patient had a PCN reaction that required hospitalization: no Has patient had a PCN reaction occurring within the last 10 years: no If all of the above answers are "NO", then  may proceed with Cephalosporin use.  Other reaction(s): Other (See Comments) .Has patient had a PCN reaction causing immediate rash, facial/tongue/throat swelling, SOB or lightheadedness with hypotension: Yes Has patient had a PCN reaction causing severe rash involving mucus membranes or skin necrosis: No Has patient had a PCN reaction that required hospitalization: no Has patient had a PCN reaction occurring within the last 10 years: no If all of the above answers are "NO", then may proceed with Cephalosporin use.   Amoxicillin Rash   Sulfa Antibiotics Rash    numbness  :   Her Current Medications Are:  Outpatient Encounter Medications as of 10/06/2021  Medication Sig   amLODipine (NORVASC) 2.5 MG tablet Take  1 tablet (2.5 mg total) by mouth daily.   Carboxymethylcellul-Glycerin (CLEAR EYES FOR DRY EYES OP) Place 1-2 drops into both eyes daily as needed (dry eyes).   Cholecalciferol (VITAMIN D3) 50 MCG (2000 UT) TABS Take 1 tablet by mouth daily.   clindamycin (CLEOCIN-T) 1 % external solution Apply topically 2 (two) times daily.   fexofenadine (ALLEGRA) 180 MG tablet TAKE 1 TABLET BY MOUTH EVERY DAY   fluticasone (FLONASE) 50 MCG/ACT nasal spray SPRAY 2 SPRAYS INTO EACH NOSTRIL EVERY DAY (Patient taking differently: Place 2 sprays into both nostrils daily as needed for allergies.)   hydrochlorothiazide (HYDRODIURIL) 25 MG tablet Take 1 tablet (25 mg total) by mouth daily.   KRILL OIL PO Take 350 mg by mouth daily.    metoprolol tartrate (LOPRESSOR) 25 MG tablet Take 0.5 tablets (12.5 mg total) by mouth 2 (two) times daily as needed (palpitations).   naproxen (NAPROSYN) 500 MG tablet TAKE 1 TABLET BY MOUTH 2 TIMES DAILY WITH A MEAL. (Patient taking differently: Take 500 mg by mouth 2 (two) times daily with a meal.)   nortriptyline (PAMELOR) 10 MG capsule Take 1 capsule (10 mg total) by mouth at bedtime.   omeprazole (PRILOSEC) 20 MG capsule Take 1 capsule (20 mg total) by mouth daily.    rosuvastatin (CRESTOR) 10 MG tablet Take 1 tablet (10 mg total) by mouth daily.   Facility-Administered Encounter Medications as of 10/06/2021  Medication   cyanocobalamin ((VITAMIN B-12)) injection 1,000 mcg  :   Review of Systems:  Out of a complete 14 point review of systems, all are reviewed and negative with the exception of these symptoms as listed below:  Review of Systems  Neurological:        Has been having issues for over a year.  Feeling like she will pass out, she states that her legs feel weird/weak.  R top head tingling sensation.  Also pressure in head.  Started on nortirptylline 96m which she states helps.  (Vest. migraine ? per ENT)Has been to ENT, Cardiology.  ESS 5, FSS 42.   Objective:  Neurological Exam  Physical Exam Physical Examination:   Vitals:   10/06/21 0759  BP: (!) 145/94  Pulse: (!) 105   She does have a mild diastolic blood pressure drop upon standing.  Sitting blood pressure and pulse 145/94 with a pulse of 105, standing 135/81 with a pulse of 111.  General Examination: The patient is a very pleasant 50y.o. female in no acute distress. She appears well-developed and well-nourished and well groomed.   HEENT: Normocephalic, atraumatic, pupils are equal, round and reactive to light, funduscopic exam benign.  Extraocular tracking is good without limitation to gaze excursion or nystagmus noted. Hearing is grossly intact. Face is symmetric with normal facial animation. Speech is clear with no dysarthria noted. There is no hypophonia. There is no lip, neck/head, jaw or voice tremor. Neck is supple with full range of passive and active motion. There are no carotid bruits on auscultation. Oropharynx exam reveals: mild mouth dryness, good dental hygiene and moderate airway crowding secondary to small airway entry and tonsillar size of about 2+.  Smaller uvula noted, Mallampati class I.  Neck circumference of 16 and three-quarter inches.  Tongue protrudes  centrally and palate elevates symmetrically.   Chest: Clear to auscultation without wheezing, rhonchi or crackles noted.  Heart: S1+S2+0, regular and normal without murmurs, rubs or gallops noted.  Slightly tachycardic.  Abdomen: Soft, non-tender and non-distended.  Extremities: There is  no pitting edema in the distal lower extremities bilaterally.   Skin: Warm and dry without trophic changes noted.   Musculoskeletal: exam reveals no obvious joint deformities, tenderness or joint swelling or erythema.  Mild slight upper body tilt to the left, left shoulder height a little higher than right and right side of the pelvis slightly higher than left, could have mild scoliosis?  Neurologically:  Mental status: The patient is awake, alert and oriented in all 4 spheres. Her immediate and remote memory, attention, language skills and fund of knowledge are appropriate. There is no evidence of aphasia, agnosia, apraxia or anomia. Speech is clear with normal prosody and enunciation. Thought process is linear. Mood is normal and affect is normal.  Cranial nerves II - XII are as described above under HEENT exam.  Motor exam: Normal bulk, strength and tone is noted. There is no tremor, Romberg is negative.  No drift.  Reflexes are 1+ throughout.  Toes are downgoing bilaterally.  Fine motor skills and coordination: intact with finger taps, hand movements, rapid alternating patting, foot taps and foot agility.  Cerebellar testing: No dysmetria or intention tremor. There is no truncal or gait ataxia.  Normal finger-to-nose and normal heel-to-shin. Sensory exam: intact to light touch in the upper and lower extremities.  Gait, station and balance: She stands without difficulty, posture as above, walks without limp or difficulty, tandem walk slightly challenging in the beginning due to body habitus but otherwise fine.    Assessment and Plan:  In summary, Renee Thomas is a very pleasant 50 y.o.-year old female with  an underlying medical history of anemia, reflux disease, vitamin D deficiency, mitral valve prolapse, PVCs, vitamin B12 deficiency, and morbid obesity with a BMI of over 45, who presents for evaluation of her intermittent lightheadedness and balance issues.  History is not compelling for vertigo and history and description of headaches and her symptoms of lightheadedness and balance issues also not telltale for vestibular migraines.  She has a normal neurological exam and she is largely reassured today.  She does have an elevated blood pressure value today and had a mild diastolic blood pressure drop upon standing.  She was not currently symptomatic from this but it could have interplay with her symptoms.  She is advised to talk to you about blood pressure management.  She has been on amitriptyline which has helped her head pressure but not her lightheadedness.  She is advised to continue this at your discretion.  She is advised to proceed with further evaluation to rule out a structural cause of her symptoms.  I would like to proceed with a brain MRI with and without contrast and some labs today including TSH, A1c, and her vitamin D level.  She may benefit from a prescription vitamin D supplementation.  She is furthermore advised to proceed with a sleep study as she may be at risk for obstructive sleep apnea.  She is also encouraged to consider taking her hydrochlorothiazide in the earlier part of the day rather than the evening.  She is encouraged to continue to work on weight loss.  Of note, in the past year and a half he has gained about 30 pounds.  She is encouraged to stay well-hydrated and change positions slowly.   I explained the sleep test procedure to the patient and suggested we could consider CPAP or AutoPap therapy for sleep apnea if she qualifies.  We will await test results and call her with her brain MRI results, lab  results and sleep study results and plan to follow-up in this clinic accordingly.   For now, she is encouraged to pursue her follow-up and treatment as planned with you and her other specialists.  She is encouraged to make a follow-up appointment with her eye doctor as she is a little overdue for her yearly checkup.  I answered all her questions.  She was in agreement with the plan.  This was an extended visit over 1 hour due to extended discussion of several problems and copious record review.   Thank you very much for allowing me to participate in the care of this nice patient. If I can be of any further assistance to you please do not hesitate to call me at [phone].  Sincerely,   Star Age, MD, PhD

## 2021-10-06 NOTE — Telephone Encounter (Signed)
One every other day for 2 weeks then every 3rd day for a week then 1 per week

## 2021-10-07 LAB — VITAMIN D 25 HYDROXY (VIT D DEFICIENCY, FRACTURES): Vit D, 25-Hydroxy: 42.8 ng/mL (ref 30.0–100.0)

## 2021-10-07 LAB — HGB A1C W/O EAG: Hgb A1c MFr Bld: 6 % — ABNORMAL HIGH (ref 4.8–5.6)

## 2021-10-07 LAB — TSH: TSH: 2.6 u[IU]/mL (ref 0.450–4.500)

## 2021-10-12 ENCOUNTER — Encounter: Payer: Self-pay | Admitting: *Deleted

## 2021-10-18 ENCOUNTER — Telehealth: Payer: Self-pay | Admitting: Neurology

## 2021-10-18 NOTE — Telephone Encounter (Signed)
LVM for pt to call me back to schedule sleep study  

## 2021-10-19 ENCOUNTER — Ambulatory Visit (INDEPENDENT_AMBULATORY_CARE_PROVIDER_SITE_OTHER): Payer: 59 | Admitting: *Deleted

## 2021-10-19 DIAGNOSIS — E538 Deficiency of other specified B group vitamins: Secondary | ICD-10-CM

## 2021-10-19 NOTE — Progress Notes (Signed)
B12 inj given in left deltoid. Tolerated well, next appt made for Jan 2023

## 2021-10-26 ENCOUNTER — Ambulatory Visit (INDEPENDENT_AMBULATORY_CARE_PROVIDER_SITE_OTHER): Payer: 59 | Admitting: Neurology

## 2021-10-26 DIAGNOSIS — R030 Elevated blood-pressure reading, without diagnosis of hypertension: Secondary | ICD-10-CM | POA: Diagnosis not present

## 2021-10-26 DIAGNOSIS — G4733 Obstructive sleep apnea (adult) (pediatric): Secondary | ICD-10-CM | POA: Diagnosis not present

## 2021-10-26 DIAGNOSIS — Z6841 Body Mass Index (BMI) 40.0 and over, adult: Secondary | ICD-10-CM

## 2021-10-26 DIAGNOSIS — R0683 Snoring: Secondary | ICD-10-CM

## 2021-10-26 DIAGNOSIS — R519 Headache, unspecified: Secondary | ICD-10-CM

## 2021-10-26 DIAGNOSIS — E559 Vitamin D deficiency, unspecified: Secondary | ICD-10-CM

## 2021-10-26 DIAGNOSIS — I951 Orthostatic hypotension: Secondary | ICD-10-CM

## 2021-10-26 DIAGNOSIS — R42 Dizziness and giddiness: Secondary | ICD-10-CM

## 2021-10-26 DIAGNOSIS — R351 Nocturia: Secondary | ICD-10-CM

## 2021-10-26 DIAGNOSIS — R2689 Other abnormalities of gait and mobility: Secondary | ICD-10-CM

## 2021-10-31 ENCOUNTER — Other Ambulatory Visit: Payer: 59

## 2021-11-01 NOTE — Progress Notes (Signed)
° °  [facility]  HOME SLEEP TEST (Watch PAT) REPORT  STUDY DATE: 10/26/2021  DOB: 23-Nov-1970  MRN: [id number]  ORDERING CLINICIAN: Huston Foley, MD, PhD   REFERRING CLINICIAN: Daphine Deutscher, [name], FNP   CLINICAL INFORMATION/HISTORY: 50 year old right-handed woman with an underlying medical history of anemia, reflux disease, vitamin D deficiency, mitral valve prolapse, PVCs, vitamin B12 deficiency, and morbid obesity with a BMI of over 45, who reports intermittent lightheadedness, feeling off balance snoring.  She does not wake up rested and does endorse daytime tiredness.    Epworth sleepiness score: 5/24.  BMI: 50.7 kg/m  FINDINGS:   Sleep Summary:   Total Recording Time (hours, min): 8 hours, 9 minutes  Total Sleep Time (hours, min):  7 hours, 35 minutes   Percent REM (%):    44.5%   Respiratory Indices:   Calculated pAHI (per hour):  30.9/hour         REM pAHI:    52/hour       NREM pAHI: 14.3/hour  Oxygen Saturation Statistics:    Oxygen Saturation (%) Mean: 94%   Minimum oxygen saturation (%):                 75%   O2 Saturation Range (%): 75-100%    O2 Saturation (minutes) <=88%: 7.7 min  Pulse Rate Statistics:   Pulse Mean (bpm):    59/min    Pulse Range (41-90/min)   IMPRESSION: OSA (obstructive sleep apnea)   RECOMMENDATION:  This home sleep test demonstrates severe obstructive sleep apnea with a total AHI of 30.9/hour and O2 nadir of 75%. Treatment with positive airway pressure is highly recommended. This will require - ideally - a full night CPAP titration study for proper treatment settings, O2 monitoring and mask fitting. For now, the patient will be advised to proceed with an autoPAP titration/trial at home.  A laboratory attended titration study can be considered in the future for optimization of his treatment and better tolerance of therapy.  Alternative treatment options are limited secondary to the severity of the patient's  sleep disordered breathing, but may include dental treatment with a dental device or surgical options.  Concomitant weight loss is recommended.  Please note, that untreated obstructive sleep apnea may carry additional perioperative morbidity. Patients with significant obstructive sleep apnea should receive perioperative PAP therapy and the surgeons and particularly the anesthesiologist should be informed of the diagnosis and the severity of the sleep disordered breathing. The patient should be cautioned not to drive, work at heights, or operate dangerous or heavy equipment when tired or sleepy. Review and reiteration of good sleep hygiene measures should be pursued with any patient. Other causes of the patient's symptoms, including circadian rhythm disturbances, an underlying mood disorder, medication effect and/or an underlying medical problem cannot be ruled out based on this test. Clinical correlation is recommended. The patient and her referring provider will be notified of the test results. The patient will be seen in follow up in sleep clinic at [facility].  I certify that I have reviewed the raw data recording prior to the issuance of this report in accordance with the standards of the American Academy of Sleep Medicine (AASM).  INTERPRETING PHYSICIAN:   Huston Foley, MD, PhD  Board Certified in Neurology and Sleep Medicine  [facility] [address], [address] [place], [place] [address] [phone]

## 2021-11-02 ENCOUNTER — Ambulatory Visit
Admission: RE | Admit: 2021-11-02 | Discharge: 2021-11-02 | Disposition: A | Discharge status: Home / Self Care | Payer: 59 | Source: Ambulatory Visit | Attending: Neurology | Admitting: Neurology

## 2021-11-02 DIAGNOSIS — E559 Vitamin D deficiency, unspecified: Secondary | ICD-10-CM

## 2021-11-02 DIAGNOSIS — R0683 Snoring: Secondary | ICD-10-CM

## 2021-11-02 DIAGNOSIS — R2689 Other abnormalities of gait and mobility: Secondary | ICD-10-CM

## 2021-11-02 DIAGNOSIS — R351 Nocturia: Secondary | ICD-10-CM

## 2021-11-02 DIAGNOSIS — I951 Orthostatic hypotension: Secondary | ICD-10-CM

## 2021-11-02 DIAGNOSIS — R519 Headache, unspecified: Secondary | ICD-10-CM | POA: Diagnosis not present

## 2021-11-02 DIAGNOSIS — R42 Dizziness and giddiness: Secondary | ICD-10-CM

## 2021-11-02 DIAGNOSIS — R030 Elevated blood-pressure reading, without diagnosis of hypertension: Secondary | ICD-10-CM

## 2021-11-02 MED ORDER — GADOBENATE DIMEGLUMINE 529 MG/ML IV SOLN
20.0000 mL | Freq: Once | INTRAVENOUS | Status: AC | PRN
  Administered 2021-11-02: 10:00:00 20 mL via INTRAVENOUS

## 2021-11-08 ENCOUNTER — Telehealth: Payer: Self-pay | Admitting: *Deleted

## 2021-11-08 NOTE — Telephone Encounter (Addendum)
I called the pt and LVM (ok per DPR) advising her that her MRI brain did not show anything acute, we saw chronic age-appropriate changes.  Overall MRI brain is reassuring per Dr. Frances Furbish.  Also included information in the message about her sleep study stating it found obstructive sleep apnea in the severe range.  Advised patient that Dr. Frances Furbish recommends treatment in the form of AutoPap.  I asked the patient to get back in touch with [place] so we can start the process. Left office number in message.   ----- Message from Huston Foley, MD sent at 11/08/2021 11:50 AM EST ----- Patient referred by her primary care nurse practitioner, seen by me on 10/06/2021, patient had a HST on 10/26/2021.    Please call and notify the patient that the recent home sleep test showed obstructive sleep apnea in the severe range. I recommend treatment for this in the form of autoPAP, which means, that we don't have to bring her in for a sleep study with CPAP, but will let her start using a so called autoPAP machine at home, through a DME company (of her choice, or as per insurance requirement). The DME representative will fit the patient with a mask of choice, educate her on how to use the machine, how to put the mask on, etc. I have placed an order in the chart. Please send the order to a local DME, talk to patient, send report to referring MD. Please also reinforce the need for compliance with treatment. We will need a FU in sleep clinic for 10 weeks post-PAP set up, please arrange that with me or one of our NPs. Thanks,   Huston Foley, MD, PhD [facility])   Huston Foley, MD  11/08/2021  9:02 AM EST     Please call patient and advise her that her brain MRI showed no acute findings and chronic, age-appropriate findings were seen.  Normal contrast uptake as well.  Overall reassuring.

## 2021-11-08 NOTE — Addendum Note (Signed)
Addended by: Huston Foley on: 11/08/2021 11:50 AM   Modules accepted: Orders

## 2021-11-08 NOTE — Procedures (Signed)
° °  [facility]  HOME SLEEP TEST (Watch PAT) REPORT  STUDY DATE: 10/26/2021  DOB: 23-Nov-1970  MRN: [id number]  ORDERING CLINICIAN: Huston Foley, MD, PhD   REFERRING CLINICIAN: Daphine Deutscher, [name], FNP   CLINICAL INFORMATION/HISTORY: 51 year old right-handed woman with an underlying medical history of anemia, reflux disease, vitamin D deficiency, mitral valve prolapse, PVCs, vitamin B12 deficiency, and morbid obesity with a BMI of over 45, who reports intermittent lightheadedness, feeling off balance snoring.  She does not wake up rested and does endorse daytime tiredness.    Epworth sleepiness score: 5/24.  BMI: 50.7 kg/m  FINDINGS:   Sleep Summary:   Total Recording Time (hours, min): 8 hours, 9 minutes  Total Sleep Time (hours, min):  7 hours, 35 minutes   Percent REM (%):    44.5%   Respiratory Indices:   Calculated pAHI (per hour):  30.9/hour         REM pAHI:    52/hour       NREM pAHI: 14.3/hour  Oxygen Saturation Statistics:    Oxygen Saturation (%) Mean: 94%   Minimum oxygen saturation (%):                 75%   O2 Saturation Range (%): 75-100%    O2 Saturation (minutes) <=88%: 7.7 min  Pulse Rate Statistics:   Pulse Mean (bpm):    59/min    Pulse Range (41-90/min)   IMPRESSION: OSA (obstructive sleep apnea)   RECOMMENDATION:  This home sleep test demonstrates severe obstructive sleep apnea with a total AHI of 30.9/hour and O2 nadir of 75%. Treatment with positive airway pressure is highly recommended. This will require - ideally - a full night CPAP titration study for proper treatment settings, O2 monitoring and mask fitting. For now, the patient will be advised to proceed with an autoPAP titration/trial at home.  A laboratory attended titration study can be considered in the future for optimization of his treatment and better tolerance of therapy.  Alternative treatment options are limited secondary to the severity of the patient's  sleep disordered breathing, but may include dental treatment with a dental device or surgical options.  Concomitant weight loss is recommended.  Please note, that untreated obstructive sleep apnea may carry additional perioperative morbidity. Patients with significant obstructive sleep apnea should receive perioperative PAP therapy and the surgeons and particularly the anesthesiologist should be informed of the diagnosis and the severity of the sleep disordered breathing. The patient should be cautioned not to drive, work at heights, or operate dangerous or heavy equipment when tired or sleepy. Review and reiteration of good sleep hygiene measures should be pursued with any patient. Other causes of the patient's symptoms, including circadian rhythm disturbances, an underlying mood disorder, medication effect and/or an underlying medical problem cannot be ruled out based on this test. Clinical correlation is recommended. The patient and her referring provider will be notified of the test results. The patient will be seen in follow up in sleep clinic at [facility].  I certify that I have reviewed the raw data recording prior to the issuance of this report in accordance with the standards of the American Academy of Sleep Medicine (AASM).  INTERPRETING PHYSICIAN:   Huston Foley, MD, PhD  Board Certified in Neurology and Sleep Medicine  [facility] [address], [address] [place], [place] [address] [phone]

## 2021-11-09 ENCOUNTER — Encounter: Payer: Self-pay | Admitting: *Deleted

## 2021-11-09 NOTE — Telephone Encounter (Signed)
[facility] doesn't take the [employer]. Will send to [facility] and let  pt know.   Autopap order, sleep study results, office note, insurance card, demographics faxed to [facility]. Received a receipt of confirmation.

## 2021-11-09 NOTE — Telephone Encounter (Signed)
The pt returned my call. We discussed the sleep study results and recommendations. Her questions were answered. She will send a copy of her insurance card front and back through [facility]. Pt is amenable to proceeding with autoPAP trial and she requested to use [employer]. The pt is aware of compliance requirements set forth by insurance. She scheduled her initial f/u for 02/02/22 at 7:45 AM.  Patient aware to bring her machine and power cord to the first appointment.  She is also aware that if she has not started CPAP by the end of February she should call us so we can push out the appointment  a little further.   Autopap order, sleep test result, etc printed and ready to send to [place] as soon as we have [employer]. Letter sent to pt. Results were already sent to PCP.

## 2021-11-21 ENCOUNTER — Ambulatory Visit (INDEPENDENT_AMBULATORY_CARE_PROVIDER_SITE_OTHER): Payer: 59

## 2021-11-21 DIAGNOSIS — Z0271 Encounter for disability determination: Secondary | ICD-10-CM

## 2021-11-21 DIAGNOSIS — E538 Deficiency of other specified B group vitamins: Secondary | ICD-10-CM

## 2021-11-21 MED ORDER — CYANOCOBALAMIN 1000 MCG/ML IJ SOLN
1000.0000 ug | INTRAMUSCULAR | Status: AC
  Administered 2021-11-21 – 2022-10-12 (×11): 1000 ug via INTRAMUSCULAR

## 2021-11-21 NOTE — Progress Notes (Signed)
Cyanocobalamin injection given to right deltoid.  Patient tolerated well. 

## 2021-12-16 ENCOUNTER — Encounter: Payer: Self-pay | Admitting: Nurse Practitioner

## 2021-12-16 ENCOUNTER — Ambulatory Visit (INDEPENDENT_AMBULATORY_CARE_PROVIDER_SITE_OTHER): Payer: 59 | Admitting: Nurse Practitioner

## 2021-12-16 VITALS — BP 121/74 | HR 74 | Temp 98.0°F | Resp 20 | Ht 65.0 in | Wt 302.0 lb

## 2021-12-16 DIAGNOSIS — F411 Generalized anxiety disorder: Secondary | ICD-10-CM

## 2021-12-16 DIAGNOSIS — M542 Cervicalgia: Secondary | ICD-10-CM | POA: Diagnosis not present

## 2021-12-16 MED ORDER — CITALOPRAM HYDROBROMIDE 20 MG PO TABS: 20.0000 mg | ORAL_TABLET | Freq: Every day | ORAL | 5 refills | Status: DC

## 2021-12-16 NOTE — Progress Notes (Addendum)
Subjective:    Patient ID: Renee Thomas, female    DOB: 1971-03-19, 51 y.o.   MRN: [id number]   Chief Complaint: Follow up from neurology (They suggest she see ortho/)   HPI Patient comes in today with 2 cpmplaints: - having seen neurology for syncopal episodes. They do not think she is having migraines so they stopped her nortriptyline. She is having cervical pain and they think that is causing all of her issues. The pain is in neck and radiates across shoulders. When she is up moving around the pain is 8/10. The neurologist thinks she should see orthopedics.  - anxiety- stresses when driving at night. Cannot go into her basement because of anxiety. Says that it is getting worse. GAD 7 : Generalized Anxiety Score 12/16/2021 09/07/2021 09/10/2020  Nervous, Anxious, on Edge 1 1 1   Control/stop worrying 1 0 0  Worry too much - different things 2 1 0  Trouble relaxing 1 1 0  Restless 1 1 0  Easily annoyed or irritable 1 1 0  Afraid - awful might happen 2 0 1  Total GAD 7 Score 9 5 2   Anxiety Difficulty Somewhat difficult Somewhat difficult Not difficult at all    Wt Readings from Last 3 Encounters:  12/16/21 (!) 302 lb (137 kg)  10/06/21 (!) 305 lb 9.6 oz (138.6 kg)  09/07/21 (!) 303 lb (137.4 kg)       Review of Systems  Constitutional:  Negative for diaphoresis.  Eyes:  Negative for pain.  Respiratory:  Negative for shortness of breath.   Cardiovascular:  Negative for chest pain, palpitations and leg swelling.  Gastrointestinal:  Negative for abdominal pain.  Endocrine: Negative for polydipsia.  Musculoskeletal:  Positive for neck pain.  Skin:  Negative for rash.  Neurological:  Negative for dizziness, weakness and headaches.  Hematological:  Bruises/bleeds easily.  All other systems reviewed and are negative.     Objective:   Physical Exam Vitals reviewed.  Constitutional:      Appearance: Normal appearance. She is obese.  Cardiovascular:     Rate and Rhythm:  Normal rate and regular rhythm.     Heart sounds: Normal heart sounds.  Pulmonary:     Effort: Pulmonary effort is normal.     Breath sounds: Normal breath sounds.  Musculoskeletal:     Comments: FROM of cervical spine without pain Grips equal bil  Skin:    General: Skin is warm.  Neurological:     General: No focal deficit present.     Mental Status: She is alert and oriented to person, place, and time.  Psychiatric:        Mood and Affect: Mood normal.        Behavior: Behavior normal.    BP 121/74    Pulse 74    Temp 98 F (36.7 C) (Temporal)    Resp 20    Ht 5\' 5"  (1.651 m)    Wt (!) 302 lb (137 kg)    SpO2 100%    BMI 50.26 kg/m        Assessment & Plan:  14/01/22 in today with chief complaint of Follow up from neurology (They suggest she see ortho/)   1. GAD (generalized anxiety disorder) Stress management Side effects of meds reviewed - citalopram (CELEXA) 20 MG tablet; Take 1 tablet (20 mg total) by mouth daily.  Dispense: 30 tablet; Refill: 5  2. Cervical pain Moist heat rest - Ambulatory referral to Orthopedic  Surgery    The above assessment and management plan was discussed with the patient. The patient verbalized understanding of and has agreed to the management plan. Patient is aware to call the clinic if symptoms persist or worsen. Patient is aware when to return to the clinic for a follow-up visit. Patient educated on when it is appropriate to go to the emergency department.   [name] Daphine Deutscher, FNP

## 2021-12-16 NOTE — Patient Instructions (Signed)

## 2021-12-19 ENCOUNTER — Telehealth: Payer: Self-pay | Admitting: Nurse Practitioner

## 2021-12-19 DIAGNOSIS — F411 Generalized anxiety disorder: Secondary | ICD-10-CM

## 2021-12-19 MED ORDER — ESCITALOPRAM OXALATE 10 MG PO TABS: 10.0000 mg | ORAL_TABLET | Freq: Every day | ORAL | 2 refills | Status: DC

## 2021-12-19 NOTE — Telephone Encounter (Signed)
Patient notified and wants to stop the meds all together. Advised that would be fine and to let us know if she decides she wants to try it again

## 2021-12-19 NOTE — Telephone Encounter (Signed)
Stopped celexa and changed to lexapro. Now know that these meds cann make you feel  bad the frst few days but that will go away. Let me know if you have anymore problems.

## 2021-12-19 NOTE — Telephone Encounter (Signed)
Pt called to let MMM know that she cant keep taking the Celexa that was prescribed to her on Friday because it makes her feel bad. Has not stopped taking it yet but needs advise from MMM on what to do.

## 2021-12-20 NOTE — Telephone Encounter (Signed)
We received a fax from [employer] stating a retroactive start date of 11/24/2021 has been denied.  I faxed this over to primary care as they handle authorizations.  Also called them and spoke with Mellody Dance (?)  letting him know.  They will look into this and get back to [place].

## 2021-12-22 ENCOUNTER — Ambulatory Visit: Payer: 59

## 2021-12-29 ENCOUNTER — Other Ambulatory Visit (HOSPITAL_BASED_OUTPATIENT_CLINIC_OR_DEPARTMENT_OTHER): Payer: Self-pay

## 2021-12-30 ENCOUNTER — Ambulatory Visit (INDEPENDENT_AMBULATORY_CARE_PROVIDER_SITE_OTHER): Payer: 59 | Admitting: *Deleted

## 2021-12-30 DIAGNOSIS — E538 Deficiency of other specified B group vitamins: Secondary | ICD-10-CM

## 2021-12-30 NOTE — Progress Notes (Signed)
Pt was given b12 inj IM L Deltoid. Pt tol tx well

## 2022-01-01 ENCOUNTER — Other Ambulatory Visit: Payer: Self-pay | Admitting: Nurse Practitioner

## 2022-01-01 DIAGNOSIS — F411 Generalized anxiety disorder: Secondary | ICD-10-CM

## 2022-01-03 ENCOUNTER — Telehealth: Payer: Self-pay | Admitting: *Deleted

## 2022-01-03 NOTE — Telephone Encounter (Signed)
Received fax relating to 30 day DL for her CPAP, she is now linked in [employer].

## 2022-01-03 NOTE — Telephone Encounter (Signed)
Placed in file for tomorrows appt.

## 2022-01-04 ENCOUNTER — Ambulatory Visit: Payer: 59 | Admitting: Neurology

## 2022-01-04 ENCOUNTER — Encounter: Payer: Self-pay | Admitting: Neurology

## 2022-01-04 VITALS — BP 149/83 | HR 81 | Ht 65.0 in | Wt 305.8 lb

## 2022-01-04 DIAGNOSIS — G4733 Obstructive sleep apnea (adult) (pediatric): Secondary | ICD-10-CM | POA: Diagnosis not present

## 2022-01-04 DIAGNOSIS — F419 Anxiety disorder, unspecified: Secondary | ICD-10-CM | POA: Diagnosis not present

## 2022-01-04 DIAGNOSIS — R2689 Other abnormalities of gait and mobility: Secondary | ICD-10-CM

## 2022-01-04 DIAGNOSIS — R42 Dizziness and giddiness: Secondary | ICD-10-CM

## 2022-01-04 DIAGNOSIS — Z9989 Dependence on other enabling machines and devices: Secondary | ICD-10-CM

## 2022-01-04 NOTE — Patient Instructions (Signed)
Please continue using your autoPAP regularly. While your insurance requires that you use PAP at least 4 hours each night on 70% of the nights, I recommend, that you not skip any nights and use it throughout the night if you can. Getting used to PAP and staying with the treatment long term does take time and patience and discipline. Untreated obstructive sleep apnea when it is moderate to severe can have an adverse impact on cardiovascular health and raise her risk for heart disease, arrhythmias, hypertension, congestive heart failure, stroke and diabetes. Untreated obstructive sleep apnea causes sleep disruption, nonrestorative sleep, and sleep deprivation. This can have an impact on your day to day functioning and cause daytime sleepiness and impairment of cognitive function, memory loss, mood disturbance, and problems focussing. Using PAP regularly can improve these symptoms.   

## 2022-01-04 NOTE — Progress Notes (Signed)
Subjective:    Patient ID: Renee Thomas is a 51 y.o. female.  HPI    Interim history:   Renee Thomas is a 51 year old right-handed woman with an underlying medical history of anemia, reflux disease, vitamin D deficiency, mitral valve prolapse, PVCs, vitamin B12 deficiency, and morbid obesity with a BMI of over 45, who pending presents for follow-up consultation of her dizziness and sleep apnea.  The patient is accompanied by her sister today.  I first met her at the request of her primary care nurse practitioner on 10/06/2021, at which time she reported a 2-year history of intermittent lightheadedness and feeling off balance.  She also reported weight gain and nonrestorative sleep.  She was advised to proceed with a brain MRI.  She had a brain MRI with and without contrast on 11/02/2021 and I reviewed the results: IMPRESSION: This MRI of the brain with and without contrast shows the following: 1.   Few scattered T2/FLAIR hyperintense foci in the subcortical or deep white matter consistent with minimal age-related chronic microvascular ischemic change.  None of the foci enhance or appear to be acute. 2.   No acute findings and normal enhancement pattern.   She was notified by phone call.  We checked some blood work, A1c was in the prediabetes range at 6.0, vitamin D level normal, TSH normal. She had a home sleep test on 10/26/2021 which indicated severe obstructive sleep apnea with an AHI of 30.9/h, O2 nadir 75%.  She was advised to start AutoPap 11/24/2021.  Today, 01/04/2022: I reviewed her AutoPap compliance data from 12/01/2021 through 01/01/2022, which is a total of 32 days, during which time she used her machine every night with percent use days greater than 4 hours at 96.9%, indicating excellent compliance with an average usage of 5 hours and 4 minutes, residual AHI at goal at 0.2/h, average pressure for the 95th percentile at 7.1 cm with a range of 6 to 13 cm, leak on the low side.  She reports  overall doing better, feels like she is benefiting from AutoPap therapy and that she wakes up better rested and feels less tired during the day.  She still has intermittent balance problems and feeling lightheaded intermittently, no actual spinning sensation.  She has not fallen recently.  She tried a new medication for anxiety a couple of weeks ago through her PCP but could not tolerate it, tried it for about 2 days.  In reviewing the chart, she was on Celexa.  She has since then been prescribed low-dose Lexapro but has not started it yet.  She has an appointment this month with her primary care nurse practitioner.  She is afraid to start any new medication.  She is endorsing anxiety.  She is fearful of falling or passing out.  She has an appointment with orthopedics for neck pain and shoulder pain.  She has an appointment with her eye doctor for a routine checkup, she went 2 years ago.  She does have prescription eyeglasses and occasionally sees floaters and 1 time she saw a brief flashing light.  The patient's allergies, current medications, family history, past medical history, past social history, past surgical history and problem list were reviewed and updated as appropriate.   Previously:   (She) reports an approximately 2-year history of intermittent lightheadedness and feeling off balance.  A couple of times she had to hold onto something but has not actually fallen thankfully.  She denies any actual spinning sensation, she was first suspected  to have Mnire's disease by her first ENT and then was sent to another ENT for second opinion. She denies any history of migraines, no severe recurrent or one-sided headaches, no photophobia, no nausea or vomiting.  Has intermittent head pressure and has also woken up with a headache occasionally.  She does snore, mildly per sister.  She has nocturia about twice per average night.  She does not wake up rested and does endorse daytime tiredness.  She feels that  her balance is not the same she feels insecure when walking down or when driving at night.  She is a little overdue for her eye exam, she has prescription progressive lenses.  She denies any sudden onset of one-sided weakness or numbness or tingling or droopy face or slurring of speech, has had intermittent left shoulder discomfort and decrease in range of motion.  Her sister has noticed that she leans to the left when she sits or stands.   She was recently evaluated by ENT and I was able to review the encounter note from 04/20/2021 when she saw Dr. Willette Pa.  Her vestibular testing was benign.  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  She was started on nortriptyline at the time.  She had a head CT without contrast on 08/07/2020 and I reviewed the results: Impression: No acute intracranial abnormality.  She has had recurrent headaches.  She has not had a brain MRI.  She has not had a sleep study.  She had recent blood work on 09/07/2021 and I was able to review the results in her chart: Lipid panel showed total cholesterol 192, triglycerides 68, HDL 52, LDL 127, CBC with differential was benign, hemoglobin 13.1, hematocrit 40.2.  CMP showed glucose of 103, BUN 15, creatinine 0.84, alk phos mildly elevated at 137, AST 20, ALT 23.  Her vitamin D level on 06/09/2021 was borderline at 31, prior to that it was low at 15.  Her latest vitamin B12 level that I could see in her chart was from 09/10/2020 and was in the normal range at 1189.  She presented to [facility] emergency room at [facility] on [facility]/08/2021 with 2 episodes of feeling lightheaded that day.  She felt like she was going to pass out.  I reviewed the emergency room records.  She had mild tachycardia but no significant abnormalities on blood testing or EKG.   She feels that the nortriptyline did help with her head pressure.  She has elevated blood pressure values and takes metoprolol as needed, hydrochlorothiazide in the evening and amlodipine in the  evening.  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   She does not drink any alcohol, she does not drink any caffeine currently.  She is a non-smoker.  She has a nephew with sleep apnea.  Her Past Medical History Is Significant For: Past Medical History:  Diagnosis Date   Anemia    Balance disorder    GERD (gastroesophageal reflux disease)    Low vitamin D level 2022   MVP (mitral valve prolapse)    PVC's (premature ventricular contractions)     Her Past Surgical History Is Significant For: Past Surgical History:  Procedure Laterality Date   NO SURGERIES      Her Family History Is Significant For: Family History  Problem Relation Age of Onset   Hypertension Mother    Diabetes Mother    Other Mother        renal disease   Hypertension  Father    Heart attack Father    Hypertension Sister    Diabetes Brother    Diabetes Brother    Sleep apnea Neg Hx     Her Social History Is Significant For: Social History   Socioeconomic History   Marital status: Single    Spouse name: Not on file   Number of children: Not on file   Years of education: Not on file   Highest education level: Not on file  Occupational History   Not on file  Tobacco Use   Smoking status: Never   Smokeless tobacco: Never  Vaping Use   Vaping Use: Never used  Substance and Sexual Activity   Alcohol use: No    Alcohol/week: 0.0 standard drinks   Drug use: No   Sexual activity: Not Currently    Partners: Male    Birth control/protection: Abstinence  Other Topics Concern   Not on file  Social History Narrative   Not on file   Social Determinants of Health   Financial Resource Strain: Not on file  Food Insecurity: Not on file  Transportation Needs: Not on file  Physical Activity: Not on file  Stress: Not on file  Social Connections: Not on file    Her Allergies Are:  Allergies  Allergen Reactions   Eggs Or Egg-Derived Products Swelling    Swells mouth with  itching   Fluarix [Influenza Virus Vaccine] Swelling    Contains egg protein.   Other Anaphylaxis    All Tree-Nuts   Peanut-Containing Drug Products Anaphylaxis   Penicillins Rash and Anaphylaxis    .Has patient had a PCN reaction causing immediate rash, facial/tongue/throat swelling, SOB or lightheadedness with hypotension: Yes Has patient had a PCN reaction causing severe rash involving mucus membranes or skin necrosis: No Has patient had a PCN reaction that required hospitalization: no Has patient had a PCN reaction occurring within the last 10 years: no If all of the above answers are "NO", then may proceed with Cephalosporin use.  Other reaction(s): Other (See Comments) .Has patient had a PCN reaction causing immediate rash, facial/tongue/throat swelling, SOB or lightheadedness with hypotension: Yes Has patient had a PCN reaction causing severe rash involving mucus membranes or skin necrosis: No Has patient had a PCN reaction that required hospitalization: no Has patient had a PCN reaction occurring within the last 10 years: no If all of the above answers are "NO", then may proceed with Cephalosporin use.   Amoxicillin Rash   Sulfa Antibiotics Rash    numbness  :   Her Current Medications Are:  Outpatient Encounter Medications as of 01/04/2022  Medication Sig   amLODipine (NORVASC) 2.5 MG tablet Take 1 tablet (2.5 mg total) by mouth daily.   Carboxymethylcellul-Glycerin (CLEAR EYES FOR DRY EYES OP) Place 1-2 drops into both eyes daily as needed (dry eyes).   Cholecalciferol (VITAMIN D3) 50 MCG (2000 UT) TABS Take 1 tablet by mouth daily.   clindamycin (CLEOCIN-T) 1 % external solution Apply topically 2 (two) times daily.   fexofenadine (ALLEGRA) 180 MG tablet TAKE 1 TABLET BY MOUTH EVERY DAY   fluticasone (FLONASE) 50 MCG/ACT nasal spray SPRAY 2 SPRAYS INTO EACH NOSTRIL EVERY DAY (Patient taking differently: Place 2 sprays into both nostrils daily as needed for allergies.)    hydrochlorothiazide (HYDRODIURIL) 25 MG tablet Take 1 tablet (25 mg total) by mouth daily.   KRILL OIL PO Take 350 mg by mouth daily.    naproxen (NAPROSYN) 500 MG tablet TAKE 1  TABLET BY MOUTH 2 TIMES DAILY WITH A MEAL. (Patient taking differently: Take 500 mg by mouth 2 (two) times daily with a meal.)   omeprazole (PRILOSEC) 20 MG capsule Take 1 capsule (20 mg total) by mouth daily.   Probiotic Product (PROBIOTIC PO) Take by mouth.   rosuvastatin (CRESTOR) 10 MG tablet Take 1 tablet (10 mg total) by mouth daily.   escitalopram (LEXAPRO) 10 MG tablet TAKE 1 TABLET BY MOUTH EVERY DAY (Patient not taking: Reported on 01/04/2022)   metoprolol tartrate (LOPRESSOR) 25 MG tablet Take 0.5 tablets (12.5 mg total) by mouth 2 (two) times daily as needed (palpitations).   Facility-Administered Encounter Medications as of 01/04/2022  Medication   cyanocobalamin ((VITAMIN B-12)) injection 1,000 mcg  :  Review of Systems:  Out of a complete 14 point review of systems, all are reviewed and negative with the exception of these symptoms as listed below:  Review of Systems  Neurological:        Pt is here for CPAP follow up Pt states she is doing well on CPAP machine . Pt states some nights she can get 5 hours on CPAP machine . Pt states he has no questions or  concerns for today's visit    Objective:  Neurological Exam  Physical Exam Physical Examination:   Vitals:   01/04/22 0837  BP: (!) 149/83  Pulse: 81   General Examination: The patient is a very pleasant 51 y.o. female in no acute distress. She appears well-developed and well-nourished and well groomed.   HEENT: Normocephalic, atraumatic, pupils are equal, round and reactive to light, extraocular tracking well-preserved, corrective eyeglasses in place.  Face is symmetric with normal facial animation, speech is clear without dysarthria, hypophonia or voice tremor.  Airway examination reveals stable findings, mild mouth dryness.  Tongue protrudes  centrally and palate elevates symmetrically. There are no carotid bruits on auscultation.    Chest: Clear to auscultation without wheezing, rhonchi or crackles noted.   Heart: S1+S2+0, regular and normal without murmurs, rubs or gallops noted.    Abdomen: Soft, non-tender and non-distended.   Extremities: There is no pitting edema in the distal lower extremities bilaterally.    Skin: Warm and dry without trophic changes noted.    Musculoskeletal: exam reveals no obvious joint deformities. Left shoulder height a little higher than right and right side of the pelvis slightly higher than left, could have mild scoliosis?   Neurologically:  Mental status: The patient is awake, alert and oriented in all 4 spheres. Her immediate and remote memory, attention, language skills and fund of knowledge are appropriate. There is no evidence of aphasia, agnosia, apraxia or anomia. Speech is clear with normal prosody and enunciation. Thought process is linear. Mood is normal and affect is normal.  Cranial nerves II - XII are as described above under HEENT exam.  Motor exam: Normal bulk, strength and tone is noted. There is no tremor.  Fine motor skills and coordination: grossly intact.  Cerebellar testing: No dysmetria or intention tremor. There is no truncal or gait ataxia.   Sensory exam: intact to light touch in the upper and lower extremities.  Gait, station and balance: She stands without difficulty, posture as above, walks without limp or difficulty.  Preserved arm swing bilaterally.   Assessment and Plan:  In summary, Renee Thomas is a very pleasant 52 year old female with an underlying medical history of anemia, reflux disease, vitamin D deficiency, mitral valve prolapse, PVCs, vitamin B12 deficiency, and morbid  obesity with a BMI of over 45, who presents for follow-up consultation of her intermittent lightheadedness and balance issues.  Work-up with lab work and brain MRI in December 2022 was benign.   Exam is overall benign and reassuring, no evidence of vertigo.  She had slight orthostatic blood pressure drop in the diastolic numbers last time we checked.  She is encouraged to follow-up with her primary care, I think it is a good idea to address her anxiety.  She had not tolerated Celexa but may be able to tolerate low-dose Lexapro.  She is encouraged to give it a try but she would like to wait until she sees her primary care nurse practitioner this month.  She is also scheduled to see an eye doctor and orthopedics.  From the sleep apnea standpoint, she was found to have severe sleep apnea with home sleep testing in December 2022.  She has established treatment with AutoPap therapy since January 2023 and is fully compliant with treatment.  She has indeed benefited from treatment and is motivated to continue with it.  She is commended for her treatment adherence.  We talked about all her test results today and reviewed her compliance data in detail.  She is advised to continue to try to hydrate well and change positions slowly and follow up with her primary care and other providers.  She is advised to follow-up in sleep clinic routinely to see one of her nurse medications in 1 year.  I answered all their questions today and the patient and her sister were in agreement. I spent 40 minutes in total face-to-face time and in reviewing records during pre-charting, more than 50% of which was spent in counseling and coordination of care, reviewing test results, reviewing medications and treatment regimen and/or in discussing or reviewing the diagnosis of OSA, anxiety, lightheadedness, balance problem, the prognosis and treatment options. Pertinent laboratory and imaging test results that were available during this visit with the patient were reviewed by me and considered in my medical decision making (see chart for details).

## 2022-01-05 ENCOUNTER — Ambulatory Visit (INDEPENDENT_AMBULATORY_CARE_PROVIDER_SITE_OTHER): Payer: 59

## 2022-01-05 ENCOUNTER — Encounter: Payer: Self-pay | Admitting: Orthopaedic Surgery

## 2022-01-05 ENCOUNTER — Ambulatory Visit (INDEPENDENT_AMBULATORY_CARE_PROVIDER_SITE_OTHER): Payer: 59 | Admitting: Orthopaedic Surgery

## 2022-01-05 ENCOUNTER — Other Ambulatory Visit: Payer: Self-pay

## 2022-01-05 VITALS — Ht 65.0 in | Wt 305.0 lb

## 2022-01-05 DIAGNOSIS — M25512 Pain in left shoulder: Secondary | ICD-10-CM

## 2022-01-05 DIAGNOSIS — M542 Cervicalgia: Secondary | ICD-10-CM

## 2022-01-05 DIAGNOSIS — G8929 Other chronic pain: Secondary | ICD-10-CM

## 2022-01-05 NOTE — Progress Notes (Signed)
? ?Office Visit Note ?  ?Patient: Renee Thomas           ?Date of Birth: [birth year]           ?MRN: [id number] ?Visit Date: 01/05/2022 ?             ?Requested by: Bennie Pierini, FNP ?[address] ?[place],  [place] [address] ?PCP: Bennie Pierini, FNP ? ? ?Assessment & Plan: ?Visit Diagnoses:  ?1. Neck pain   ?2. Chronic left shoulder pain   ?3. Cervicalgia   ? ? ?Plan: The patient's neck and left shoulder pain with difficulty with balance and stumbling would recommend proceeding with cervical MRI scan.  She has had had MRI scan reviewed by her neurologist.  Office follow-up after cervical MRI scan. ? ?Follow-Up Instructions: No follow-ups on file.  ? ?Orders:  ?Orders Placed This Encounter  ?Procedures  ? XR Cervical Spine 2 or 3 views  ? XR Shoulder Left  ? MR Cervical Spine w/o contrast  ? ?No orders of the defined types were placed in this encounter. ? ? ? ? Procedures: ?No procedures performed ? ? ?Clinical Data: ?No additional findings. ? ? ?Subjective: ?Chief Complaint  ?Patient presents with  ? Neck - Pain  ? Left Shoulder - Pain  ? ? ?HPI 51 year old female with greater than a year history of balance problems and pain in her neck that radiates into her left shoulder.  She states even with activities such as doing paperwork she notes discomfort.  She has been treated for anxiety and was unsure if some of the medications like Celexa could have contributed.  She has to be very careful when she turns and has stumbled several times but has not actually fallen in the last year.  She has had problems with lightheadedness for greater than a year.  Additionally she does have some sleep apnea, BMI 50 with associated hypertension. ? ?Review of Systems all other systems noncontributory to HPI. ? ? ?Objective: ?Vital Signs: Ht 5\' 5"  (1.651 m)   Wt (!) 305 lb (138.3 kg)   BMI 50.75 kg/m?  ? ?Physical Exam ?Constitutional:   ?   Appearance: She is well-developed.  ?HENT:  ?   Head: Normocephalic.  ?    Right Ear: External ear normal.  ?   Left Ear: External ear normal. There is no impacted cerumen.  ?Eyes:  ?   Pupils: Pupils are equal, round, and reactive to light.  ?Neck:  ?   Thyroid: No thyromegaly.  ?   Trachea: No tracheal deviation.  ?Cardiovascular:  ?   Rate and Rhythm: Normal rate.  ?Pulmonary:  ?   Effort: Pulmonary effort is normal.  ?Abdominal:  ?   Palpations: Abdomen is soft.  ?Musculoskeletal:  ?   Cervical back: No rigidity.  ?Skin: ?   General: Skin is warm and dry.  ?Neurological:  ?   Mental Status: She is alert and oriented to person, place, and time.  ?Psychiatric:     ?   Behavior: Behavior normal.  ? ? ?Ortho Exam patient has 1+ upper extremity reflexes symmetrical.  1+ knee and ankle jerk.  No lower extremity clonus anterior tib EHL is strong.  She has slow short stride gait some balance issues when she turns or rotates.  Bilateral brachial plexus tenderness.  No improvement with cervical distraction no increased pain with compression.  Neck and shoulder pain with forward flexion and with extension cervical spine.  Biceps triceps wrist flexion extension  is strong with encouragement.  Negative drop arm test negative impingement.  Long head of the biceps is stable.  No upper extremity atrophy. ? ?Specialty Comments:  ?No specialty comments available. ? ?Imaging: ?No results found. ? ? ?PMFS History: ?Patient Active Problem List  ? Diagnosis Date Noted  ? GAD (generalized anxiety disorder) 12/16/2021  ? Primary hypertension 09/10/2020  ? GERD without esophagitis 05/02/2019  ? BMI 45.0-49.9, adult (HCC) 05/02/2019  ? Low serum vitamin B12 11/17/2016  ? ?Past Medical History:  ?Diagnosis Date  ? Anemia   ? Balance disorder   ? GERD (gastroesophageal reflux disease)   ? Low vitamin D level 2022  ? MVP (mitral valve prolapse)   ? PVC's (premature ventricular contractions)   ?  ?Family History  ?Problem Relation Age of Onset  ? Hypertension Mother   ? Diabetes Mother   ? Other Mother   ?     renal  disease  ? Hypertension Father   ? Heart attack Father   ? Hypertension Sister   ? Diabetes Brother   ? Diabetes Brother   ? Sleep apnea Neg Hx   ?  ?Past Surgical History:  ?Procedure Laterality Date  ? NO SURGERIES    ? ?Social History  ? ?Occupational History  ? Not on file  ?Tobacco Use  ? Smoking status: Never  ? Smokeless tobacco: Never  ?Vaping Use  ? Vaping Use: Never used  ?Substance and Sexual Activity  ? Alcohol use: No  ?  Alcohol/week: 0.0 standard drinks  ? Drug use: No  ? Sexual activity: Not Currently  ?  Partners: Male  ?  Birth control/protection: Abstinence  ? ? ? ? ? ? ?

## 2022-01-12 ENCOUNTER — Telehealth: Payer: Self-pay | Admitting: Nurse Practitioner

## 2022-01-12 NOTE — Telephone Encounter (Signed)
Patient has not started taking escitalopram (LEXAPRO) 10 MG tablet and said she wants to know if she needs to keep her appt for 3/10 or reschedule it after she starts the medicine. She plans to start it tomorrow and wants to know if it would be okay to take 5mg  instead of 10mg . Please call back and advise.  ?

## 2022-01-12 NOTE — Telephone Encounter (Signed)
She can start with 5mg  but needs to bump up to 10mg  after a week. ?

## 2022-01-12 NOTE — Telephone Encounter (Signed)
Can she take 5mg  instead of 10mg ? Please advise.  ?

## 2022-01-12 NOTE — Telephone Encounter (Signed)
Patient aware and verbalized understanding. °

## 2022-01-12 NOTE — Telephone Encounter (Signed)
No need to be seen until has benn on lexapro for about 3 weeks ?

## 2022-01-13 ENCOUNTER — Ambulatory Visit: Payer: 59 | Admitting: Nurse Practitioner

## 2022-01-16 ENCOUNTER — Telehealth: Payer: Self-pay | Admitting: Neurology

## 2022-01-16 NOTE — Telephone Encounter (Signed)
I spoke with Renee Thomas @ [facility]. He will look into this and call me back.  ?

## 2022-01-16 NOTE — Telephone Encounter (Signed)
Pt called and LVM stating that she received a letter from her DME requesting her to return the cpap machine due to the provider not sending the request in the 10 day allotted time. Once she takes the machine back the provider would have to send the request again. Please advise. ?

## 2022-01-16 NOTE — Telephone Encounter (Signed)
Renee Thomas called back. He states they did get approval from [employer] and as of right now the pt does not need to return her machine. However they will look into the "filing deadline" that was provided by [employer]. He has already spoken with the pt and will follow-up with her later this afternoon or tomorrow.  ?

## 2022-01-25 ENCOUNTER — Other Ambulatory Visit: Payer: Self-pay

## 2022-01-25 ENCOUNTER — Ambulatory Visit (HOSPITAL_COMMUNITY)
Admission: RE | Admit: 2022-01-25 | Discharge: 2022-01-25 | Disposition: A | Discharge status: Home / Self Care | Payer: 59 | Source: Ambulatory Visit | Attending: Orthopaedic Surgery | Admitting: Orthopaedic Surgery

## 2022-01-25 DIAGNOSIS — M542 Cervicalgia: Secondary | ICD-10-CM | POA: Insufficient documentation

## 2022-01-30 ENCOUNTER — Ambulatory Visit (INDEPENDENT_AMBULATORY_CARE_PROVIDER_SITE_OTHER): Payer: 59 | Admitting: *Deleted

## 2022-01-30 DIAGNOSIS — E538 Deficiency of other specified B group vitamins: Secondary | ICD-10-CM | POA: Diagnosis not present

## 2022-01-30 NOTE — Progress Notes (Signed)
Vitamin b12 injection and pt tolerated well.  ?

## 2022-02-02 ENCOUNTER — Encounter: Payer: Self-pay | Admitting: Orthopaedic Surgery

## 2022-02-02 ENCOUNTER — Ambulatory Visit: Payer: Self-pay | Admitting: Neurology

## 2022-02-02 ENCOUNTER — Ambulatory Visit: Payer: 59 | Admitting: Orthopaedic Surgery

## 2022-02-02 DIAGNOSIS — M542 Cervicalgia: Secondary | ICD-10-CM | POA: Insufficient documentation

## 2022-02-02 NOTE — Progress Notes (Signed)
? ?Office Visit Note ?  ?Patient: Renee Thomas           ?Date of Birth: 01/07/1971           ?MRN: [id number] ?Visit Date: 02/02/2022 ?             ?Requested by: Chevis Pretty, FNP ?[address] ?[place],  [place] [address] ?PCP: Chevis Pretty, FNP ? ? ?Assessment & Plan: ?Visit Diagnoses:  ?1. Neck pain   ? ? ?Plan: MRI scan is reviewed no areas cervical compression.  She will follow-up with her neurologist for headache and balance problems. ? ?Follow-Up Instructions: No follow-ups on file.  ? ?Orders:  ?No orders of the defined types were placed in this encounter. ? ?No orders of the defined types were placed in this encounter. ? ? ? ? Procedures: ?No procedures performed ? ? ?Clinical Data: ?No additional findings. ? ? ?Subjective: ?Chief Complaint  ?Patient presents with  ? Neck - Pain, Follow-up  ?  MRI cervical spine review  ? ? ?HPI 51 year old female returns post MRI scan cervical spine from going problems with balance dizziness she has had headaches.  She states one-point they thought she might have migraines but the neurologist does not think so.  She had not had an MRI scan.  Cervical MRI scan shows some minimal bulging but no areas of foraminal or central stenosis of significance. ? ?Review of Systems all other systems updated unchanged from last visit. ? ? ?Objective: ?Vital Signs: Ht 5\' 5"  (1.651 m)   Wt (!) 305 lb (138.3 kg)   BMI 50.75 kg/m?  ? ?Physical Exam ?Constitutional:   ?   Appearance: She is well-developed.  ?HENT:  ?   Head: Normocephalic.  ?   Right Ear: External ear normal.  ?   Left Ear: External ear normal. There is no impacted cerumen.  ?Eyes:  ?   Pupils: Pupils are equal, round, and reactive to light.  ?Neck:  ?   Thyroid: No thyromegaly.  ?   Trachea: No tracheal deviation.  ?Cardiovascular:  ?   Rate and Rhythm: Normal rate.  ?Pulmonary:  ?   Effort: Pulmonary effort is normal.  ?Abdominal:  ?   Palpations: Abdomen is soft.  ?Musculoskeletal:  ?   Cervical  back: No rigidity.  ?Skin: ?   General: Skin is warm and dry.  ?Neurological:  ?   Mental Status: She is alert and oriented to person, place, and time.  ?Psychiatric:     ?   Behavior: Behavior normal.  ? ? ?Ortho Exam Patient has discomfort with cervical rotation flexion.  Normal heel-toe gait no lower extremity clonus. ? ?Specialty Comments:  ?No specialty comments available. ? ?Imaging: ?CLINICAL DATA:  Chronic neck pain radiating to bilateral shoulders ?for 2 years, and notes balance issues. ?  ?EXAM: ?MRI CERVICAL SPINE WITHOUT CONTRAST ?  ?TECHNIQUE: ?Multiplanar, multisequence MR imaging of the cervical spine was ?performed. No intravenous contrast was administered. ?  ?COMPARISON:  X-ray cervical 01/05/2022. ?  ?FINDINGS: ?Alignment: Straightening without focal angulation or listhesis. ?  ?Vertebrae: No acute or suspicious osseous findings. ?  ?Cord: Normal in signal and caliber. ?  ?Posterior Fossa, vertebral arteries, paraspinal tissues: Visualized ?portions of the posterior fossa appear unremarkable.Bilateral ?vertebral artery flow voids. No significant paraspinal findings. ?  ?Disc levels: ?  ?C2-3: Normal interspace. ?  ?C3-4: Normal interspace. ?  ?C4-5: Mild disc bulging and uncinate spurring asymmetric to the ?right. Mild right foraminal narrowing. No  cord deformity. ?  ?C5-6: Mild disc bulging and uncinate spurring. No spinal stenosis or ?nerve root encroachment. ?  ?C6-7: Mild disc bulging and uncinate spurring. No spinal stenosis or ?nerve root encroachment. ?  ?C7-T1: Mild bilateral facet hypertrophy. No spinal stenosis or nerve ?root encroachment. ?  ?IMPRESSION: ?1. Mild disc bulging and uncinate spurring as described with only ?mild resulting right foraminal narrowing at C4-5. ?2. No acute findings, cord deformity or nerve root encroachment ?identified. ?  ?  ?Electronically Signed ?  By: Richardean Sale M.D. ?  On: 01/27/2022 11:59 ? ? ?PMFS History: ?Patient Active Problem List  ? Diagnosis  Date Noted  ? Neck pain 02/02/2022  ? GAD (generalized anxiety disorder) 12/16/2021  ? Primary hypertension 09/10/2020  ? GERD without esophagitis 05/02/2019  ? BMI 45.0-49.9, adult ([facility]) 05/02/2019  ? Low serum vitamin B12 11/17/2016  ? ?Past Medical History:  ?Diagnosis Date  ? Anemia   ? Balance disorder   ? GERD (gastroesophageal reflux disease)   ? Low vitamin D level 2022  ? MVP (mitral valve prolapse)   ? PVC's (premature ventricular contractions)   ?  ?Family History  ?Problem Relation Age of Onset  ? Hypertension Mother   ? Diabetes Mother   ? Other Mother   ?     renal disease  ? Hypertension Father   ? Heart attack Father   ? Hypertension Sister   ? Diabetes Brother   ? Diabetes Brother   ? Sleep apnea Neg Hx   ?  ?Past Surgical History:  ?Procedure Laterality Date  ? NO SURGERIES    ? ?Social History  ? ?Occupational History  ? Not on file  ?Tobacco Use  ? Smoking status: Never  ? Smokeless tobacco: Never  ?Vaping Use  ? Vaping Use: Never used  ?Substance and Sexual Activity  ? Alcohol use: No  ?  Alcohol/week: 0.0 standard drinks  ? Drug use: No  ? Sexual activity: Not Currently  ?  Partners: Male  ?  Birth control/protection: Abstinence  ? ? ? ? ? ? ?

## 2022-02-04 ENCOUNTER — Other Ambulatory Visit: Payer: Self-pay | Admitting: Nurse Practitioner

## 2022-02-04 DIAGNOSIS — F411 Generalized anxiety disorder: Secondary | ICD-10-CM

## 2022-03-02 ENCOUNTER — Ambulatory Visit (INDEPENDENT_AMBULATORY_CARE_PROVIDER_SITE_OTHER): Payer: 59 | Admitting: *Deleted

## 2022-03-02 DIAGNOSIS — E538 Deficiency of other specified B group vitamins: Secondary | ICD-10-CM | POA: Diagnosis not present

## 2022-03-02 NOTE — Progress Notes (Addendum)
Patient in today for monthly B12 injection. 1000 mcg administered in left deltoid. Patient tolerated well.  ?

## 2022-03-07 ENCOUNTER — Ambulatory Visit: Payer: 59 | Admitting: Nurse Practitioner

## 2022-03-08 ENCOUNTER — Other Ambulatory Visit: Payer: Self-pay | Admitting: Nurse Practitioner

## 2022-03-08 DIAGNOSIS — E7800 Pure hypercholesterolemia, unspecified: Secondary | ICD-10-CM

## 2022-03-09 ENCOUNTER — Other Ambulatory Visit: Payer: Self-pay | Admitting: Nurse Practitioner

## 2022-03-09 DIAGNOSIS — I10 Essential (primary) hypertension: Secondary | ICD-10-CM

## 2022-03-10 ENCOUNTER — Ambulatory Visit (INDEPENDENT_AMBULATORY_CARE_PROVIDER_SITE_OTHER): Payer: 59 | Admitting: Nurse Practitioner

## 2022-03-10 ENCOUNTER — Encounter: Payer: Self-pay | Admitting: Nurse Practitioner

## 2022-03-10 VITALS — BP 131/81 | HR 62 | Temp 97.6°F | Resp 20 | Ht 65.0 in | Wt 299.0 lb

## 2022-03-10 DIAGNOSIS — E7800 Pure hypercholesterolemia, unspecified: Secondary | ICD-10-CM | POA: Diagnosis not present

## 2022-03-10 DIAGNOSIS — F411 Generalized anxiety disorder: Secondary | ICD-10-CM

## 2022-03-10 DIAGNOSIS — E538 Deficiency of other specified B group vitamins: Secondary | ICD-10-CM

## 2022-03-10 DIAGNOSIS — R739 Hyperglycemia, unspecified: Secondary | ICD-10-CM | POA: Diagnosis not present

## 2022-03-10 DIAGNOSIS — I10 Essential (primary) hypertension: Secondary | ICD-10-CM | POA: Diagnosis not present

## 2022-03-10 DIAGNOSIS — K219 Gastro-esophageal reflux disease without esophagitis: Secondary | ICD-10-CM | POA: Diagnosis not present

## 2022-03-10 DIAGNOSIS — R42 Dizziness and giddiness: Secondary | ICD-10-CM

## 2022-03-10 DIAGNOSIS — L739 Follicular disorder, unspecified: Secondary | ICD-10-CM

## 2022-03-10 DIAGNOSIS — Z6841 Body Mass Index (BMI) 40.0 and over, adult: Secondary | ICD-10-CM

## 2022-03-10 LAB — BAYER DCA HB A1C WAIVED: HB A1C (BAYER DCA - WAIVED): 5.6 % (ref 4.8–5.6)

## 2022-03-10 MED ORDER — HYDROCHLOROTHIAZIDE 25 MG PO TABS: 25.0000 mg | ORAL_TABLET | Freq: Every day | ORAL | 1 refills | Status: DC

## 2022-03-10 MED ORDER — ROSUVASTATIN CALCIUM 10 MG PO TABS: 10.0000 mg | ORAL_TABLET | Freq: Every day | ORAL | 1 refills | Status: DC

## 2022-03-10 MED ORDER — AMLODIPINE BESYLATE 2.5 MG PO TABS: 2.5000 mg | ORAL_TABLET | Freq: Every day | ORAL | 1 refills | Status: DC

## 2022-03-10 MED ORDER — METOPROLOL TARTRATE 25 MG PO TABS: ORAL_TABLET | ORAL | 1 refills | Status: DC

## 2022-03-10 MED ORDER — OMEPRAZOLE 20 MG PO CPDR: 20.0000 mg | DELAYED_RELEASE_CAPSULE | Freq: Every day | ORAL | 1 refills | Status: DC

## 2022-03-10 MED ORDER — ESCITALOPRAM OXALATE 10 MG PO TABS: 10.0000 mg | ORAL_TABLET | Freq: Every day | ORAL | 1 refills | Status: DC

## 2022-03-10 NOTE — Patient Instructions (Signed)

## 2022-03-10 NOTE — Progress Notes (Signed)
? ?Subjective:  ? ? Patient ID: Renee MorisRhonda F Thomas, female    DOB: 10/25/1971, 51 y.o.   MRN: [id number] ? ? ?Chief Complaint: Medical Management of Chronic Issues (? Boil on back of neck and rash on right arm/) ?  ? ?HPI: ? ?Renee Thomas is a 51 y.o. who identifies as a female who was assigned female at birth.  ? ?Social history: ?Lives with: sister ?Work history: retired ? ? ?Comes in today for follow up of the following chronic medical issues: ? ?1. Primary hypertension ?No c/o chest pain, sob or headache. Does  check blood pressure at home. Systolic is in 120's consistently. Has  not taken her meds this morning ?BP Readings from Last 3 Encounters:  ?01/04/22 (!) 149/83  ?12/16/21 121/74  ?10/06/21 (!) 145/94  ? ? ? ?2. Pure hypercholesterolemia ?Does try to watch diet but does no dedicated exercise. ?Lab Results  ?Component Value Date  ? CHOL 192 09/07/2021  ? HDL 52 09/07/2021  ? LDLCALC 127 (H) 09/07/2021  ? TRIG 68 09/07/2021  ? CHOLHDL 3.7 09/07/2021  ? ?The 10-year ASCVD risk score (Arnett DK, et al., 2019) is: 9.3% ? ?3. Elevated blood sugar ?Fasting blood sugars are aound 100-130. No low blood sugars. ?Lab Results  ?Component Value Date  ? HGBA1C 6.0 (H) 10/06/2021  ? ? ? ?4. GERD without esophagitis ?Is on omeprazole daily and is doing well. ? ?5. Low serum vitamin B12 ?Is monthly injections supplement. ?Lab Results  ?Component Value Date  ? [id number] 1,189 09/10/2020  ? ? ? ?6. GAD (generalized anxiety disorder) ?Is on lexapro and that is really helping hr. ? ?  03/10/2022  ?  9:24 AM 12/16/2021  ?  8:14 AM 09/07/2021  ?  9:39 AM 09/10/2020  ? 12:25 PM  ?GAD 7 : Generalized Anxiety Score  ?Nervous, Anxious, on Edge 1 1 1 1   ?Control/stop worrying 1 1 0 0  ?Worry too much - different things 1 2 1  0  ?Trouble relaxing 0 1 1 0  ?Restless 0 1 1 0  ?Easily annoyed or irritable 1 1 1  0  ?Afraid - awful might happen 1 2 0 1  ?Total GAD 7 Score 5 9 5 2   ?Anxiety Difficulty Not difficult at all Somewhat difficult Somewhat  difficult Not difficult at all  ? ? ? ? ?7. BMI 45.0-49.9, adult (HCC) ?Weight is down 6 lbs ?Wt Readings from Last 3 Encounters:  ?03/10/22 299 lb (135.6 kg)  ?02/02/22 (!) 305 lb (138.3 kg)  ?01/05/22 (!) 305 lb (138.3 kg)  ? ?BMI Readings from Last 3 Encounters:  ?03/10/22 49.76 kg/m?  ?02/02/22 50.75 kg/m?  ?01/05/22 50.75 kg/m?  ? ? ?Chronic vertigo- still unsure of cause. Nortriptyline helped but neurolgist stopped that because was not coming from migraines.  ? ?New complaints: ?2 complaints: ?- boil on back of neck- been there several weeks and is gradully getting bigger. ?- rash on forearm- does not itch ? ?Allergies  ?Allergen Reactions  ? Eggs Or Egg-Derived Products Swelling  ?  Swells mouth with itching  ? Fluarix [Influenza Virus Vaccine] Swelling  ?  Contains egg protein.  ? Other Anaphylaxis  ?  All Tree-Nuts  ? Peanut-Containing Drug Products Anaphylaxis  ? Penicillins Rash and Anaphylaxis  ?  Marland Kitchen.Has patient had a PCN reaction causing immediate rash, facial/tongue/throat swelling, SOB or lightheadedness with hypotension: Yes ?Has patient had a PCN reaction causing severe rash involving mucus membranes or skin necrosis: No ?Has patient  had a PCN reaction that required hospitalization: no ?Has patient had a PCN reaction occurring within the last 10 years: no ?If all of the above answers are "NO", then may proceed with Cephalosporin use. ? ?Other reaction(s): Other (See Comments) ?Marland KitchenHas patient had a PCN reaction causing immediate rash, facial/tongue/throat swelling, SOB or lightheadedness with hypotension: Yes ?Has patient had a PCN reaction causing severe rash involving mucus membranes or skin necrosis: No ?Has patient had a PCN reaction that required hospitalization: no ?Has patient had a PCN reaction occurring within the last 10 years: no ?If all of the above answers are "NO", then may proceed with Cephalosporin use.  ? Amoxicillin Rash  ? Sulfa Antibiotics Rash  ?  numbness  ? ?Outpatient Encounter  Medications as of 03/10/2022  ?Medication Sig  ? amLODipine (NORVASC) 2.5 MG tablet Take 1 tablet (2.5 mg total) by mouth daily.  ? Carboxymethylcellul-Glycerin (CLEAR EYES FOR DRY EYES OP) Place 1-2 drops into both eyes daily as needed (dry eyes).  ? Cholecalciferol (VITAMIN D3) 50 MCG (2000 UT) TABS Take 1 tablet by mouth daily.  ? clindamycin (CLEOCIN-T) 1 % external solution Apply topically 2 (two) times daily.  ? escitalopram (LEXAPRO) 10 MG tablet TAKE 1 TABLET BY MOUTH EVERY DAY  ? fexofenadine (ALLEGRA) 180 MG tablet TAKE 1 TABLET BY MOUTH EVERY DAY  ? fluticasone (FLONASE) 50 MCG/ACT nasal spray SPRAY 2 SPRAYS INTO EACH NOSTRIL EVERY DAY (Patient taking differently: Place 2 sprays into both nostrils daily as needed for allergies.)  ? hydrochlorothiazide (HYDRODIURIL) 25 MG tablet Take 1 tablet (25 mg total) by mouth daily.  ? KRILL OIL PO Take 350 mg by mouth daily.   ? metoprolol tartrate (LOPRESSOR) 25 MG tablet TAKE 1/2 TABLET (12.5 MG TOTAL) BY MOUTH 2 (TWO) TIMES DAILY AS NEEDED (PALPITATIONS).  ? omeprazole (PRILOSEC) 20 MG capsule Take 1 capsule (20 mg total) by mouth daily.  ? Probiotic Product (PROBIOTIC PO) Take by mouth.  ? rosuvastatin (CRESTOR) 10 MG tablet TAKE 1 TABLET BY MOUTH EVERY DAY  ? naproxen (NAPROSYN) 500 MG tablet TAKE 1 TABLET BY MOUTH 2 TIMES DAILY WITH A MEAL. (Patient not taking: Reported on 03/10/2022)  ? ?Facility-Administered Encounter Medications as of 03/10/2022  ?Medication  ? cyanocobalamin ((VITAMIN B-12)) injection 1,000 mcg  ? ? ?Past Surgical History:  ?Procedure Laterality Date  ? NO SURGERIES    ? ? ?Family History  ?Problem Relation Age of Onset  ? Hypertension Mother   ? Diabetes Mother   ? Other Mother   ?     renal disease  ? Hypertension Father   ? Heart attack Father   ? Hypertension Sister   ? Diabetes Brother   ? Diabetes Brother   ? Sleep apnea Neg Hx   ? ? ? ? ?Controlled substance contract: n/a ? ? ? ? ?Review of Systems  ?Constitutional:  Negative for  diaphoresis.  ?Eyes:  Negative for pain.  ?Respiratory:  Negative for shortness of breath.   ?Cardiovascular:  Negative for chest pain, palpitations and leg swelling.  ?Gastrointestinal:  Negative for abdominal pain.  ?Endocrine: Negative for polydipsia.  ?Skin:  Negative for rash.  ?Neurological:  Negative for dizziness, weakness and headaches.  ?Hematological:  Does not bruise/bleed easily.  ?All other systems reviewed and are negative. ? ?   ?Objective:  ? Physical Exam ?Vitals and nursing note reviewed.  ?Constitutional:   ?   General: She is not in acute distress. ?   Appearance: Normal appearance.  She is well-developed.  ?HENT:  ?   Head: Normocephalic.  ?   Right Ear: Tympanic membrane normal.  ?   Left Ear: Tympanic membrane normal.  ?   Nose: Nose normal.  ?   Mouth/Throat:  ?   Mouth: Mucous membranes are moist.  ?Eyes:  ?   Pupils: Pupils are equal, round, and reactive to light.  ?Neck:  ?   Vascular: No carotid bruit or JVD.  ?Cardiovascular:  ?   Rate and Rhythm: Normal rate and regular rhythm.  ?   Heart sounds: Normal heart sounds.  ?Pulmonary:  ?   Effort: Pulmonary effort is normal. No respiratory distress.  ?   Breath sounds: Normal breath sounds. No wheezing or rales.  ?Chest:  ?   Chest wall: No tenderness.  ?Abdominal:  ?   General: Bowel sounds are normal. There is no distension or abdominal bruit.  ?   Palpations: Abdomen is soft. There is no hepatomegaly, splenomegaly, mass or pulsatile mass.  ?   Tenderness: There is no abdominal tenderness.  ?Musculoskeletal:     ?   General: Normal range of motion.  ?   Cervical back: Normal range of motion and neck supple.  ?Lymphadenopathy:  ?   Cervical: No cervical adenopathy.  ?Skin: ?   General: Skin is warm and dry.  ?   Comments: Tender raised nodule left posterior neck- no erythema , no drainage  ?Neurological:  ?   Mental Status: She is alert and oriented to person, place, and time.  ?   Deep Tendon Reflexes: Reflexes are normal and symmetric.   ?Psychiatric:     ?   Behavior: Behavior normal.     ?   Thought Content: Thought content normal.     ?   Judgment: Judgment normal.  ? ? ?BP 131/81   Pulse 62   Temp 97.6 ?F (36.4 ?C) (Temporal)   Resp 20   Ht

## 2022-03-11 LAB — CMP14+EGFR
ALT: 17 IU/L (ref 0–32)
AST: 15 IU/L (ref 0–40)
Albumin/Globulin Ratio: 1.4 (ref 1.2–2.2)
Albumin: 4.6 g/dL (ref 3.8–4.9)
Alkaline Phosphatase: 124 IU/L — ABNORMAL HIGH (ref 44–121)
BUN/Creatinine Ratio: 21 (ref 9–23)
BUN: 19 mg/dL (ref 6–24)
Bilirubin Total: 0.4 mg/dL (ref 0.0–1.2)
CO2: 23 mmol/L (ref 20–29)
Calcium: 9.8 mg/dL (ref 8.7–10.2)
Chloride: 102 mmol/L (ref 96–106)
Creatinine, Ser: 0.89 mg/dL (ref 0.57–1.00)
Globulin, Total: 3.4 g/dL (ref 1.5–4.5)
Glucose: 91 mg/dL (ref 70–99)
Potassium: 4.3 mmol/L (ref 3.5–5.2)
Sodium: 140 mmol/L (ref 134–144)
Total Protein: 8 g/dL (ref 6.0–8.5)
eGFR: 77 mL/min/{1.73_m2} (ref >59)

## 2022-03-11 LAB — CBC WITH DIFFERENTIAL/PLATELET
Basophils Absolute: 0 10*3/uL (ref 0.0–0.2)
Basos: 1 %
EOS (ABSOLUTE): 0.1 10*3/uL (ref 0.0–0.4)
Eos: 2 %
Hematocrit: 39.6 % (ref 34.0–46.6)
Hemoglobin: 12.9 g/dL (ref 11.1–15.9)
Immature Grans (Abs): 0 10*3/uL (ref 0.0–0.1)
Immature Granulocytes: 0 %
Lymphocytes Absolute: 2.4 10*3/uL (ref 0.7–3.1)
Lymphs: 41 %
MCH: 27.9 pg (ref 26.6–33.0)
MCHC: 32.6 g/dL (ref 31.5–35.7)
MCV: 86 fL (ref 79–97)
Monocytes Absolute: 0.5 10*3/uL (ref 0.1–0.9)
Monocytes: 8 %
Neutrophils Absolute: 2.8 10*3/uL (ref 1.4–7.0)
Neutrophils: 48 %
Platelets: 359 10*3/uL (ref 150–450)
RBC: 4.63 x10E6/uL (ref 3.77–5.28)
RDW: 14 % (ref 11.7–15.4)
WBC: 5.8 10*3/uL (ref 3.4–10.8)

## 2022-03-11 LAB — LIPID PANEL
Chol/HDL Ratio: 2.6 ratio (ref 0.0–4.4)
Cholesterol, Total: 131 mg/dL (ref 100–199)
HDL: 51 mg/dL (ref >39)
LDL Chol Calc (NIH): 67 mg/dL (ref 0–99)
Triglycerides: 63 mg/dL (ref 0–149)
VLDL Cholesterol Cal: 13 mg/dL (ref 5–40)

## 2022-03-13 LAB — LYME DISEASE SEROLOGY W/REFLEX: Lyme Total Antibody EIA: NEGATIVE

## 2022-03-13 LAB — SPECIMEN STATUS REPORT

## 2022-03-15 ENCOUNTER — Ambulatory Visit (INDEPENDENT_AMBULATORY_CARE_PROVIDER_SITE_OTHER): Payer: 59 | Admitting: Family Medicine

## 2022-03-15 ENCOUNTER — Encounter: Payer: Self-pay | Admitting: Family Medicine

## 2022-03-15 VITALS — BP 127/76 | HR 66 | Temp 98.8°F | Ht 65.0 in | Wt 299.0 lb

## 2022-03-15 DIAGNOSIS — L089 Local infection of the skin and subcutaneous tissue, unspecified: Secondary | ICD-10-CM | POA: Diagnosis not present

## 2022-03-15 DIAGNOSIS — L720 Epidermal cyst: Secondary | ICD-10-CM | POA: Diagnosis not present

## 2022-03-15 MED ORDER — DOXYCYCLINE HYCLATE 100 MG PO TABS: 100.0000 mg | ORAL_TABLET | Freq: Two times a day (BID) | ORAL | 0 refills | Status: AC

## 2022-03-15 NOTE — Progress Notes (Signed)
?  ? ?Subjective:  ?Patient ID: Renee Thomas, female    DOB: 09-04-1971, 51 y.o.   MRN: [id number] ? ?Patient Care Team: ?Bennie Pierini, FNP as PCP - General (Family Medicine) ?Antoine Poche, MD as PCP - Cardiology (Cardiology)  ? ?Chief Complaint:  Cyst ? ? ?HPI: ?Renee Thomas is a 51 y.o. female presenting on 03/15/2022 for Cyst ? ? ?Pt presents today with complaints of tender and draining cyst to neck. She states she was seen by her PCP and the area was not draining at this time. She has been using warm compresses at home. States now the cyst is very tender and has started to drain. No fever, chills, weakness, or confusion. Has had trouble sleeping due to location of cyst.  ? ? ?Relevant past medical, surgical, family, and social history reviewed and updated as indicated.  ?Allergies and medications reviewed and updated. Data reviewed: Chart in Epic. ? ? ?Past Medical History:  ?Diagnosis Date  ? Anemia   ? Balance disorder   ? GERD (gastroesophageal reflux disease)   ? Low vitamin D level 2022  ? MVP (mitral valve prolapse)   ? PVC's (premature ventricular contractions)   ? ? ?Past Surgical History:  ?Procedure Laterality Date  ? NO SURGERIES    ? ? ?Social History  ? ?Socioeconomic History  ? Marital status: Single  ?  Spouse name: Not on file  ? Number of children: Not on file  ? Years of education: Not on file  ? Highest education level: Not on file  ?Occupational History  ? Not on file  ?Tobacco Use  ? Smoking status: Never  ? Smokeless tobacco: Never  ?Vaping Use  ? Vaping Use: Never used  ?Substance and Sexual Activity  ? Alcohol use: No  ?  Alcohol/week: 0.0 standard drinks  ? Drug use: No  ? Sexual activity: Not Currently  ?  Partners: Male  ?  Birth control/protection: Abstinence  ?Other Topics Concern  ? Not on file  ?Social History Narrative  ? Not on file  ? ?Social Determinants of Health  ? ?Financial Resource Strain: Not on file  ?Food Insecurity: Not on file  ?Transportation Needs: Not  on file  ?Physical Activity: Not on file  ?Stress: Not on file  ?Social Connections: Not on file  ?Intimate Partner Violence: Not on file  ? ? ?Outpatient Encounter Medications as of 03/15/2022  ?Medication Sig  ? amLODipine (NORVASC) 2.5 MG tablet Take 1 tablet (2.5 mg total) by mouth daily.  ? Carboxymethylcellul-Glycerin (CLEAR EYES FOR DRY EYES OP) Place 1-2 drops into both eyes daily as needed (dry eyes).  ? Cholecalciferol (VITAMIN D3) 50 MCG (2000 UT) TABS Take 1 tablet by mouth daily.  ? clindamycin (CLEOCIN-T) 1 % external solution Apply topically 2 (two) times daily.  ? doxycycline (VIBRA-TABS) 100 MG tablet Take 1 tablet (100 mg total) by mouth 2 (two) times daily for 10 days. 1 po bid  ? escitalopram (LEXAPRO) 10 MG tablet Take 1 tablet (10 mg total) by mouth daily.  ? fexofenadine (ALLEGRA) 180 MG tablet TAKE 1 TABLET BY MOUTH EVERY DAY  ? fluticasone (FLONASE) 50 MCG/ACT nasal spray SPRAY 2 SPRAYS INTO EACH NOSTRIL EVERY DAY (Patient taking differently: Place 2 sprays into both nostrils daily as needed for allergies.)  ? hydrochlorothiazide (HYDRODIURIL) 25 MG tablet Take 1 tablet (25 mg total) by mouth daily.  ? KRILL OIL PO Take 350 mg by mouth daily.   ? metoprolol tartrate (  LOPRESSOR) 25 MG tablet TAKE 1/2 TABLET (12.5 MG TOTAL) BY MOUTH 2 (TWO) TIMES DAILY AS NEEDED (PALPITATIONS).  ? naproxen (NAPROSYN) 500 MG tablet TAKE 1 TABLET BY MOUTH 2 TIMES DAILY WITH A MEAL.  ? omeprazole (PRILOSEC) 20 MG capsule Take 1 capsule (20 mg total) by mouth daily.  ? Probiotic Product (PROBIOTIC PO) Take by mouth.  ? rosuvastatin (CRESTOR) 10 MG tablet Take 1 tablet (10 mg total) by mouth daily.  ? ?Facility-Administered Encounter Medications as of 03/15/2022  ?Medication  ? cyanocobalamin ((VITAMIN B-12)) injection 1,000 mcg  ? ? ?Allergies  ?Allergen Reactions  ? Eggs Or Egg-Derived Products Swelling  ?  Swells mouth with itching  ? Fluarix [Influenza Virus Vaccine] Swelling  ?  Contains egg protein.  ? Other  Anaphylaxis  ?  All Tree-Nuts  ? Peanut-Containing Drug Products Anaphylaxis  ? Penicillins Rash and Anaphylaxis  ?  Marland KitchenHas patient had a PCN reaction causing immediate rash, facial/tongue/throat swelling, SOB or lightheadedness with hypotension: Yes ?Has patient had a PCN reaction causing severe rash involving mucus membranes or skin necrosis: No ?Has patient had a PCN reaction that required hospitalization: no ?Has patient had a PCN reaction occurring within the last 10 years: no ?If all of the above answers are "NO", then may proceed with Cephalosporin use. ? ?Other reaction(s): Other (See Comments) ?Marland KitchenHas patient had a PCN reaction causing immediate rash, facial/tongue/throat swelling, SOB or lightheadedness with hypotension: Yes ?Has patient had a PCN reaction causing severe rash involving mucus membranes or skin necrosis: No ?Has patient had a PCN reaction that required hospitalization: no ?Has patient had a PCN reaction occurring within the last 10 years: no ?If all of the above answers are "NO", then may proceed with Cephalosporin use.  ? Amoxicillin Rash  ? Sulfa Antibiotics Rash  ?  numbness  ? ? ?Review of Systems  ?Constitutional:  Negative for activity change, appetite change, chills, diaphoresis, fatigue, fever and unexpected weight change.  ?Respiratory:  Negative for cough and shortness of breath.   ?Cardiovascular:  Negative for chest pain, palpitations and leg swelling.  ?Gastrointestinal:  Negative for abdominal pain, nausea and vomiting.  ?Genitourinary:  Negative for decreased urine volume and difficulty urinating.  ?Musculoskeletal:  Negative for arthralgias and myalgias.  ?Skin:  Positive for color change and wound. Negative for pallor and rash.  ?Neurological:  Negative for weakness and headaches.  ?Psychiatric/Behavioral:  Negative for confusion.   ?All other systems reviewed and are negative. ? ?   ? ?Objective:  ?BP 127/76   Pulse 66   Temp 98.8 ?F (37.1 ?C)   Ht 5\' 5"  (1.651 m)   Wt 299  lb (135.6 kg)   SpO2 96%   BMI 49.76 kg/m?   ? ?Wt Readings from Last 3 Encounters:  ?03/15/22 299 lb (135.6 kg)  ?03/10/22 299 lb (135.6 kg)  ?02/02/22 (!) 305 lb (138.3 kg)  ? ? ?Physical Exam ?Vitals and nursing note reviewed.  ?Constitutional:   ?   General: She is not in acute distress. ?   Appearance: Normal appearance. She is obese. She is not ill-appearing, toxic-appearing or diaphoretic.  ?HENT:  ?   Head: Normocephalic and atraumatic.  ?   Nose: Nose normal.  ?Eyes:  ?   Pupils: Pupils are equal, round, and reactive to light.  ?Cardiovascular:  ?   Rate and Rhythm: Normal rate and regular rhythm.  ?   Heart sounds: Normal heart sounds.  ?Pulmonary:  ?   Effort: Pulmonary  effort is normal.  ?   Breath sounds: Normal breath sounds.  ?Skin: ?   General: Skin is warm and dry.  ?   Capillary Refill: Capillary refill takes less than 2 seconds.  ?   Findings: Abscess and erythema present.  ? ?    ?Neurological:  ?   General: No focal deficit present.  ?   Mental Status: She is alert and oriented to person, place, and time.  ?Psychiatric:     ?   Mood and Affect: Mood normal.     ?   Behavior: Behavior normal.     ?   Thought Content: Thought content normal.     ?   Judgment: Judgment normal.  ? ? ?Results for orders placed or performed in visit on 03/10/22  ?Bayer DCA Hb A1c Waived  ?Result Value Ref Range  ? HB A1C (BAYER DCA - WAIVED) 5.6 4.8 - 5.6 %  ?CBC with Differential/Platelet  ?Result Value Ref Range  ? WBC 5.8 3.4 - 10.8 x10E3/uL  ? RBC 4.63 3.77 - 5.28 x10E6/uL  ? Hemoglobin 12.9 11.1 - 15.9 g/dL  ? Hematocrit 39.6 34.0 - 46.6 %  ? MCV 86 79 - 97 fL  ? MCH 27.9 26.6 - 33.0 pg  ? MCHC 32.6 31.5 - 35.7 g/dL  ? RDW 14.0 11.7 - 15.4 %  ? Platelets 359 150 - 450 x10E3/uL  ? Neutrophils 48 Not Estab. %  ? Lymphs 41 Not Estab. %  ? Monocytes 8 Not Estab. %  ? Eos 2 Not Estab. %  ? Basos 1 Not Estab. %  ? Neutrophils Absolute 2.8 1.4 - 7.0 x10E3/uL  ? Lymphocytes Absolute 2.4 0.7 - 3.1 x10E3/uL  ? Monocytes  Absolute 0.5 0.1 - 0.9 x10E3/uL  ? EOS (ABSOLUTE) 0.1 0.0 - 0.4 x10E3/uL  ? Basophils Absolute 0.0 0.0 - 0.2 x10E3/uL  ? Immature Granulocytes 0 Not Estab. %  ? Immature Grans (Abs) 0.0 0.0 - 0.1 x10E3/uL  ?

## 2022-03-23 ENCOUNTER — Encounter: Payer: Self-pay | Admitting: Obstetrics and Gynecology

## 2022-03-23 ENCOUNTER — Other Ambulatory Visit (HOSPITAL_COMMUNITY)
Admission: RE | Admit: 2022-03-23 | Discharge: 2022-03-23 | Disposition: A | Discharge status: Home / Self Care | Payer: 59 | Source: Ambulatory Visit | Attending: Obstetrics and Gynecology | Admitting: Obstetrics and Gynecology

## 2022-03-23 ENCOUNTER — Ambulatory Visit (INDEPENDENT_AMBULATORY_CARE_PROVIDER_SITE_OTHER): Payer: 59 | Admitting: Obstetrics and Gynecology

## 2022-03-23 VITALS — BP 142/92 | HR 78 | Resp 12 | Ht 65.25 in | Wt 302.0 lb

## 2022-03-23 DIAGNOSIS — Z124 Encounter for screening for malignant neoplasm of cervix: Secondary | ICD-10-CM

## 2022-03-23 DIAGNOSIS — N950 Postmenopausal bleeding: Secondary | ICD-10-CM | POA: Diagnosis not present

## 2022-03-23 DIAGNOSIS — Z1159 Encounter for screening for other viral diseases: Secondary | ICD-10-CM

## 2022-03-23 DIAGNOSIS — Z113 Encounter for screening for infections with a predominantly sexual mode of transmission: Secondary | ICD-10-CM | POA: Insufficient documentation

## 2022-03-23 DIAGNOSIS — Z1231 Encounter for screening mammogram for malignant neoplasm of breast: Secondary | ICD-10-CM | POA: Diagnosis not present

## 2022-03-23 DIAGNOSIS — Z01419 Encounter for gynecological examination (general) (routine) without abnormal findings: Secondary | ICD-10-CM

## 2022-03-23 DIAGNOSIS — Z114 Encounter for screening for human immunodeficiency virus [HIV]: Secondary | ICD-10-CM

## 2022-03-23 NOTE — Progress Notes (Signed)
51 y.o. G0P0000 Single African American female here for annual exam.    FSH 61 and estradiol 16 on 03/16/21.  Had menses in November and then again in March.  Her prior LMP was 2020.   Desires STD testing.   Still having balance problems.   PCP:   Bennie Pierini, FNP  Patient's last menstrual period was 01/09/2022.     Period Duration (Days): 7 Period Pattern: (!) Irregular Menstrual Flow: Heavy Dysmenorrhea: (!) Mild Dysmenorrhea Symptoms: Cramping, Other (Comment) (low back pain)     Sexually active: No.  The current method of family planning is abstinence.    Exercising: No.  The patient does not participate in regular exercise at present. Smoker:  no  Health Maintenance: Pap:  09-26-2019 negative            08-25-16 negative, HR HPV negative  History of abnormal Pap:  no MMG:  03-02-21 density A, BIRADS 1 negative-- [facility] does not accept insurance Colonoscopy:  05-04-18 polyps, 5 year f/u  BMD:  n/a  Result  n/a TDaP:  2014 Gardasil:   no HIV: 07-09-15 NR Hep C: 08-25-16 negative  Screening Labs:  PCP   reports that she has never smoked. She has never used smokeless tobacco. She reports that she does not drink alcohol and does not use drugs.  Past Medical History:  Diagnosis Date   Anemia    Balance disorder    GERD (gastroesophageal reflux disease)    Low vitamin D level 2022   MVP (mitral valve prolapse)    PVC's (premature ventricular contractions)    Sleep apnea    Dr. Frances Furbish- on APAP    Past Surgical History:  Procedure Laterality Date   NO SURGERIES      Current Outpatient Medications  Medication Sig Dispense Refill   amLODipine (NORVASC) 2.5 MG tablet Take 1 tablet (2.5 mg total) by mouth daily. 90 tablet 1   Carboxymethylcellul-Glycerin (CLEAR EYES FOR DRY EYES OP) Place 1-2 drops into both eyes daily as needed (dry eyes).     Cholecalciferol (VITAMIN D3) 50 MCG (2000 UT) TABS Take 1 tablet by mouth daily.     clindamycin (CLEOCIN-T) 1 %  external solution Apply topically 2 (two) times daily. 30 mL 1   doxycycline (VIBRA-TABS) 100 MG tablet Take 1 tablet (100 mg total) by mouth 2 (two) times daily for 10 days. 1 po bid 20 tablet 0   escitalopram (LEXAPRO) 10 MG tablet Take 1 tablet (10 mg total) by mouth daily. 90 tablet 1   fexofenadine (ALLEGRA) 180 MG tablet TAKE 1 TABLET BY MOUTH EVERY DAY 90 tablet 1   fluticasone (FLONASE) 50 MCG/ACT nasal spray SPRAY 2 SPRAYS INTO EACH NOSTRIL EVERY DAY (Patient taking differently: Place 2 sprays into both nostrils daily as needed for allergies.) 48 mL 1   hydrochlorothiazide (HYDRODIURIL) 25 MG tablet Take 1 tablet (25 mg total) by mouth daily. 90 tablet 1   KRILL OIL PO Take 350 mg by mouth daily.      metoprolol tartrate (LOPRESSOR) 25 MG tablet TAKE 1/2 TABLET (12.5 MG TOTAL) BY MOUTH 2 (TWO) TIMES DAILY AS NEEDED (PALPITATIONS). 90 tablet 1   naproxen (NAPROSYN) 500 MG tablet TAKE 1 TABLET BY MOUTH 2 TIMES DAILY WITH A MEAL. 60 tablet 1   omeprazole (PRILOSEC) 20 MG capsule Take 1 capsule (20 mg total) by mouth daily. 90 capsule 1   Probiotic Product (PROBIOTIC PO) Take by mouth.     rosuvastatin (CRESTOR) 10  MG tablet Take 1 tablet (10 mg total) by mouth daily. 90 tablet 1   Current Facility-Administered Medications  Medication Dose Route Frequency Provider Last Rate Last Admin   cyanocobalamin ((VITAMIN B-12)) injection 1,000 mcg  1,000 mcg Intramuscular Q30 days Bennie Pierini, FNP   1,000 mcg at 03/02/22 6203    Family History  Problem Relation Age of Onset   Hypertension Mother    Diabetes Mother    Other Mother        renal disease   Hypertension Father    Heart attack Father    Hypertension Sister    Diabetes Brother    Diabetes Brother    Sleep apnea Neg Hx     Review of Systems  Neurological:        Balance problems   All other systems reviewed and are negative.  Exam:   BP (!) 142/92 (BP Location: Right Arm, Patient Position: Sitting, Cuff Size:  Large)   Pulse 78   Resp 12   Ht 5' 5.25" (1.657 m)   Wt (!) 302 lb (137 kg)   LMP 01/09/2022   BMI 49.87 kg/m     General appearance: alert, cooperative and appears stated age Head: normocephalic, without obvious abnormality, atraumatic Neck: no adenopathy, supple, symmetrical, trachea midline and thyroid normal to inspection and palpation Lungs: clear to auscultation bilaterally Breasts: normal appearance, no masses or tenderness, No nipple retraction or dimpling, No nipple discharge or bleeding, No axillary adenopathy Heart: regular rate and rhythm Abdomen: soft, non-tender; no masses, no organomegaly Extremities: extremities normal, atraumatic, no cyanosis or edema Skin: skin color, texture, turgor normal. No rashes or lesions Lymph nodes: cervical, supraclavicular, and axillary nodes normal. Neurologic: grossly normal  Pelvic: External genitalia:  no lesions              No abnormal inguinal nodes palpated.              Urethra:  normal appearing urethra with no masses, tenderness or lesions              Bartholins and Skenes: normal                 Vagina: normal appearing vagina with normal color and discharge, no lesions              Cervix: no lesions              Pap taken: yes Bimanual Exam:  Uterus: unable to palpate due to BMI and abdominal wall musculature tightening.                Adnexa: unable to palpate              Rectal exam: yes.  Confirms.              Anus:  normal sphincter tone, no lesions  Chaperone was present for exam:  Selena Batten, CMA  Assessment:   Well woman visit with gynecologic exam. Postmenopausal bleeding.  STD screening.   Plan: Mammogram screening discussed.  Mammogram ordered.  Self breast awareness reviewed. Pap and HR HPV as above. Guidelines for Calcium, Vitamin D, regular exercise program including cardiovascular and weight bearing exercise. STD screening.  Return for pelvic ultrasound and endometrial biopsy.  Rational explained.   Etiologies for postmenopausal bleeding discussed:  polyps, cysts, cancer and precancer.  Follow up annually and prn.   After visit summary provided.

## 2022-03-23 NOTE — Patient Instructions (Signed)

## 2022-03-24 LAB — CYTOLOGY - PAP
Chlamydia: NEGATIVE
Comment: NEGATIVE
Comment: NEGATIVE
Comment: NEGATIVE
Comment: NORMAL
Diagnosis: NEGATIVE
High risk HPV: NEGATIVE
Neisseria Gonorrhea: NEGATIVE
Trichomonas: NEGATIVE

## 2022-03-24 LAB — ESTRADIOL: Estradiol: 26 pg/mL

## 2022-03-24 LAB — HEPATITIS C ANTIBODY
Hepatitis C Ab: NONREACTIVE
SIGNAL TO CUT-OFF: 0.08 (ref <1.00)

## 2022-03-24 LAB — FOLLICLE STIMULATING HORMONE: FSH: 44.8 m[IU]/mL

## 2022-03-24 LAB — HIV ANTIBODY (ROUTINE TESTING W REFLEX): HIV 1&2 Ab, 4th Generation: NONREACTIVE

## 2022-03-24 LAB — RPR: RPR Ser Ql: NONREACTIVE

## 2022-03-25 ENCOUNTER — Other Ambulatory Visit: Payer: Self-pay | Admitting: Nurse Practitioner

## 2022-03-27 ENCOUNTER — Telehealth: Payer: Self-pay

## 2022-03-27 NOTE — Telephone Encounter (Signed)
I spoke with patient. She said [facility] does not accept her insurance. Would you be okay with her having u/s in our office without a MD visit right after and then schedule RG visit another day when available for endo biopsy?  (I spoke with front desk and they have u/'s openings as soon as tomorrow-the problem lies with coordinating with MD visit.)

## 2022-03-27 NOTE — Telephone Encounter (Signed)
-----   Message from Nunzio Cobbs, MD sent at 03/27/2022  1:12 PM EDT ----- Please move up the appointment for the pelvic ultrasound and endometrial biopsy.  She can have the pelvic ultrasound at [place] and then the biopsy a few days later.   Thank you,   Josefa Half ----- Message ----- From: Ramond Craver, RMA Sent: 03/27/2022  12:28 PM EDT To: Brook Oletta Lamas, MD  Spoke with patient and informed her of all the result note info.  She is already schedule for u/s and endo bx on 05/11/22.

## 2022-03-28 NOTE — Telephone Encounter (Signed)
Please schedule her pelvic ultrasound and the endometrial biopsy with me on separate days, so the patient can receive her care sooner.

## 2022-03-29 NOTE — Telephone Encounter (Signed)
Front desk rescheduled patient to 04/25/22 for u/s and endo biopsy/visit.  I did let them know it could be separated to get u/s sooner.

## 2022-03-29 NOTE — Telephone Encounter (Signed)
Encounter reviewed and closed.  Thank you.  

## 2022-03-29 NOTE — Telephone Encounter (Signed)
Message sent to appt desk to arrange for patient.

## 2022-03-30 ENCOUNTER — Ambulatory Visit: Payer: 59 | Admitting: Nurse Practitioner

## 2022-03-31 ENCOUNTER — Ambulatory Visit (INDEPENDENT_AMBULATORY_CARE_PROVIDER_SITE_OTHER): Payer: 59 | Admitting: *Deleted

## 2022-03-31 DIAGNOSIS — E538 Deficiency of other specified B group vitamins: Secondary | ICD-10-CM | POA: Diagnosis not present

## 2022-04-25 ENCOUNTER — Ambulatory Visit: Payer: 59

## 2022-04-25 DIAGNOSIS — N950 Postmenopausal bleeding: Secondary | ICD-10-CM | POA: Diagnosis not present

## 2022-05-01 ENCOUNTER — Ambulatory Visit (INDEPENDENT_AMBULATORY_CARE_PROVIDER_SITE_OTHER): Payer: 59 | Admitting: *Deleted

## 2022-05-01 DIAGNOSIS — E538 Deficiency of other specified B group vitamins: Secondary | ICD-10-CM | POA: Diagnosis not present

## 2022-05-04 NOTE — Progress Notes (Deleted)
GYNECOLOGY  VISIT   HPI: 51 y.o.   Single  African American  female   G0P0000 with No LMP recorded. Patient is perimenopausal.   here for pelvic ultrasound.   GYNECOLOGIC HISTORY: No LMP recorded. Patient is perimenopausal. Contraception:  ***Abstinence Menopausal hormone therapy:  none Last mammogram:  ***03-02-21 Neg/BiRads1 Last pap smear:  09-26-19 Neg, 08-25-16 Neg:Neg HR HPV        OB History     Gravida  0   Para  0   Term  0   Preterm  0   AB  0   Living  0      SAB  0   IAB  0   Ectopic  0   Multiple  0   Live Births                 Patient Active Problem List   Diagnosis Date Noted   Neck pain 02/02/2022   GAD (generalized anxiety disorder) 12/16/2021   Primary hypertension 09/10/2020   GERD without esophagitis 05/02/2019   BMI 45.0-49.9, adult (HCC) 05/02/2019   Low serum vitamin B12 11/17/2016    Past Medical History:  Diagnosis Date   Anemia    Balance disorder    GERD (gastroesophageal reflux disease)    Low vitamin D level 2022   MVP (mitral valve prolapse)    PVC's (premature ventricular contractions)    Sleep apnea    Dr. Frances Furbish- on APAP    Past Surgical History:  Procedure Laterality Date   NO SURGERIES      Current Outpatient Medications  Medication Sig Dispense Refill   amLODipine (NORVASC) 2.5 MG tablet Take 1 tablet (2.5 mg total) by mouth daily. 90 tablet 1   Carboxymethylcellul-Glycerin (CLEAR EYES FOR DRY EYES OP) Place 1-2 drops into both eyes daily as needed (dry eyes).     Cholecalciferol (VITAMIN D3) 50 MCG (2000 UT) TABS Take 1 tablet by mouth daily.     clindamycin (CLEOCIN-T) 1 % external solution Apply topically 2 (two) times daily. 30 mL 1   escitalopram (LEXAPRO) 10 MG tablet Take 1 tablet (10 mg total) by mouth daily. 90 tablet 1   fexofenadine (ALLEGRA) 180 MG tablet TAKE 1 TABLET BY MOUTH EVERY DAY 90 tablet 1   fluticasone (FLONASE) 50 MCG/ACT nasal spray SPRAY 2 SPRAYS INTO EACH NOSTRIL EVERY DAY  (Patient taking differently: Place 2 sprays into both nostrils daily as needed for allergies.) 48 mL 1   hydrochlorothiazide (HYDRODIURIL) 25 MG tablet Take 1 tablet (25 mg total) by mouth daily. 90 tablet 1   KRILL OIL PO Take 350 mg by mouth daily.      metoprolol tartrate (LOPRESSOR) 25 MG tablet TAKE 1/2 TABLET (12.5 MG TOTAL) BY MOUTH 2 (TWO) TIMES DAILY AS NEEDED (PALPITATIONS). 90 tablet 1   naproxen (NAPROSYN) 500 MG tablet TAKE 1 TABLET BY MOUTH 2 TIMES DAILY WITH A MEAL. 60 tablet 1   omeprazole (PRILOSEC) 20 MG capsule Take 1 capsule (20 mg total) by mouth daily. 90 capsule 1   Probiotic Product (PROBIOTIC PO) Take by mouth.     rosuvastatin (CRESTOR) 10 MG tablet Take 1 tablet (10 mg total) by mouth daily. 90 tablet 1   Current Facility-Administered Medications  Medication Dose Route Frequency Provider Last Rate Last Admin   cyanocobalamin ((VITAMIN B-12)) injection 1,000 mcg  1,000 mcg Intramuscular Q30 days Bennie Pierini, FNP   1,000 mcg at 05/01/22 3086     ALLERGIES: Eggs or  egg-derived products, Fluarix [influenza virus vaccine], Other, Peanut-containing drug products, Penicillins, Amoxicillin, and Sulfa antibiotics  Family History  Problem Relation Age of Onset   Hypertension Mother    Diabetes Mother    Other Mother        renal disease   Hypertension Father    Heart attack Father    Hypertension Sister    Diabetes Brother    Diabetes Brother    Sleep apnea Neg Hx     Social History   Socioeconomic History   Marital status: Single    Spouse name: Not on file   Number of children: Not on file   Years of education: Not on file   Highest education level: Not on file  Occupational History   Not on file  Tobacco Use   Smoking status: Never   Smokeless tobacco: Never  Vaping Use   Vaping Use: Never used  Substance and Sexual Activity   Alcohol use: No    Alcohol/week: 0.0 standard drinks of alcohol   Drug use: No   Sexual activity: Not Currently     Partners: Male    Birth control/protection: Abstinence  Other Topics Concern   Not on file  Social History Narrative   Not on file   Social Determinants of Health   Financial Resource Strain: Not on file  Food Insecurity: Not on file  Transportation Needs: Not on file  Physical Activity: Not on file  Stress: Not on file  Social Connections: Not on file  Intimate Partner Violence: Not on file    Review of Systems  PHYSICAL EXAMINATION:    There were no vitals taken for this visit.    General appearance: alert, cooperative and appears stated age Head: Normocephalic, without obvious abnormality, atraumatic Neck: no adenopathy, supple, symmetrical, trachea midline and thyroid normal to inspection and palpation Lungs: clear to auscultation bilaterally Breasts: normal appearance, no masses or tenderness, No nipple retraction or dimpling, No nipple discharge or bleeding, No axillary or supraclavicular adenopathy Heart: regular rate and rhythm Abdomen: soft, non-tender, no masses,  no organomegaly Extremities: extremities normal, atraumatic, no cyanosis or edema Skin: Skin color, texture, turgor normal. No rashes or lesions Lymph nodes: Cervical, supraclavicular, and axillary nodes normal. No abnormal inguinal nodes palpated Neurologic: Grossly normal  Pelvic: External genitalia:  no lesions              Urethra:  normal appearing urethra with no masses, tenderness or lesions              Bartholins and Skenes: normal                 Vagina: normal appearing vagina with normal color and discharge, no lesions              Cervix: no lesions                Bimanual Exam:  Uterus:  normal size, contour, position, consistency, mobility, non-tender              Adnexa: no mass, fullness, tenderness              Rectal exam: {yes no:[id number]}.  Confirms.              Anus:  normal sphincter tone, no lesions  Chaperone was present for exam:  ***  ASSESSMENT     PLAN     An  After Visit Summary was printed and given to the patient.  ______ minutes  face to face time of which over 50% was spent in counseling.

## 2022-05-10 NOTE — Progress Notes (Unsigned)
GYNECOLOGY  VISIT   HPI: 51 y.o.   Single  African American  female   G0P0000 with No LMP recorded. Patient is perimenopausal.   here to discuss pelvic ultrasound results and have possible EMB.     GYNECOLOGIC HISTORY: No LMP recorded. Patient is perimenopausal. Contraception:  ***Abstinence Menopausal hormone therapy:  *** Last mammogram:  *** 03-02-21 density A, BIRADS 1 negative Last pap smear:  09-26-19 Neg, 08-25-16 Neg:Neg HR HPV        OB History     Gravida  0   Para  0   Term  0   Preterm  0   AB  0   Living  0      SAB  0   IAB  0   Ectopic  0   Multiple  0   Live Births                 Patient Active Problem List   Diagnosis Date Noted   Neck pain 02/02/2022   GAD (generalized anxiety disorder) 12/16/2021   Primary hypertension 09/10/2020   GERD without esophagitis 05/02/2019   BMI 45.0-49.9, adult (HCC) 05/02/2019   Low serum vitamin B12 11/17/2016    Past Medical History:  Diagnosis Date   Anemia    Balance disorder    GERD (gastroesophageal reflux disease)    Low vitamin D level 2022   MVP (mitral valve prolapse)    PVC's (premature ventricular contractions)    Sleep apnea    Dr. Frances Furbish- on APAP    Past Surgical History:  Procedure Laterality Date   NO SURGERIES      Current Outpatient Medications  Medication Sig Dispense Refill   amLODipine (NORVASC) 2.5 MG tablet Take 1 tablet (2.5 mg total) by mouth daily. 90 tablet 1   Carboxymethylcellul-Glycerin (CLEAR EYES FOR DRY EYES OP) Place 1-2 drops into both eyes daily as needed (dry eyes).     Cholecalciferol (VITAMIN D3) 50 MCG (2000 UT) TABS Take 1 tablet by mouth daily.     clindamycin (CLEOCIN-T) 1 % external solution Apply topically 2 (two) times daily. 30 mL 1   escitalopram (LEXAPRO) 10 MG tablet Take 1 tablet (10 mg total) by mouth daily. 90 tablet 1   fexofenadine (ALLEGRA) 180 MG tablet TAKE 1 TABLET BY MOUTH EVERY DAY 90 tablet 1   fluticasone (FLONASE) 50 MCG/ACT  nasal spray SPRAY 2 SPRAYS INTO EACH NOSTRIL EVERY DAY (Patient taking differently: Place 2 sprays into both nostrils daily as needed for allergies.) 48 mL 1   hydrochlorothiazide (HYDRODIURIL) 25 MG tablet Take 1 tablet (25 mg total) by mouth daily. 90 tablet 1   KRILL OIL PO Take 350 mg by mouth daily.      metoprolol tartrate (LOPRESSOR) 25 MG tablet TAKE 1/2 TABLET (12.5 MG TOTAL) BY MOUTH 2 (TWO) TIMES DAILY AS NEEDED (PALPITATIONS). 90 tablet 1   naproxen (NAPROSYN) 500 MG tablet TAKE 1 TABLET BY MOUTH 2 TIMES DAILY WITH A MEAL. 60 tablet 1   omeprazole (PRILOSEC) 20 MG capsule Take 1 capsule (20 mg total) by mouth daily. 90 capsule 1   Probiotic Product (PROBIOTIC PO) Take by mouth.     rosuvastatin (CRESTOR) 10 MG tablet Take 1 tablet (10 mg total) by mouth daily. 90 tablet 1   Current Facility-Administered Medications  Medication Dose Route Frequency Provider Last Rate Last Admin   cyanocobalamin ((VITAMIN B-12)) injection 1,000 mcg  1,000 mcg Intramuscular Q30 days Bennie Pierini, FNP  1,000 mcg at 05/01/22 5009     ALLERGIES: Eggs or egg-derived products, Fluarix [influenza virus vaccine], Other, Peanut-containing drug products, Penicillins, Amoxicillin, and Sulfa antibiotics  Family History  Problem Relation Age of Onset   Hypertension Mother    Diabetes Mother    Other Mother        renal disease   Hypertension Father    Heart attack Father    Hypertension Sister    Diabetes Brother    Diabetes Brother    Sleep apnea Neg Hx     Social History   Socioeconomic History   Marital status: Single    Spouse name: Not on file   Number of children: Not on file   Years of education: Not on file   Highest education level: Not on file  Occupational History   Not on file  Tobacco Use   Smoking status: Never   Smokeless tobacco: Never  Vaping Use   Vaping Use: Never used  Substance and Sexual Activity   Alcohol use: No    Alcohol/week: 0.0 standard drinks of  alcohol   Drug use: No   Sexual activity: Not Currently    Partners: Male    Birth control/protection: Abstinence  Other Topics Concern   Not on file  Social History Narrative   Not on file   Social Determinants of Health   Financial Resource Strain: Not on file  Food Insecurity: Not on file  Transportation Needs: Not on file  Physical Activity: Not on file  Stress: Not on file  Social Connections: Not on file  Intimate Partner Violence: Not on file    Review of Systems  PHYSICAL EXAMINATION:    There were no vitals taken for this visit.    General appearance: alert, cooperative and appears stated age Head: Normocephalic, without obvious abnormality, atraumatic Neck: no adenopathy, supple, symmetrical, trachea midline and thyroid normal to inspection and palpation Lungs: clear to auscultation bilaterally Breasts: normal appearance, no masses or tenderness, No nipple retraction or dimpling, No nipple discharge or bleeding, No axillary or supraclavicular adenopathy Heart: regular rate and rhythm Abdomen: soft, non-tender, no masses,  no organomegaly Extremities: extremities normal, atraumatic, no cyanosis or edema Skin: Skin color, texture, turgor normal. No rashes or lesions Lymph nodes: Cervical, supraclavicular, and axillary nodes normal. No abnormal inguinal nodes palpated Neurologic: Grossly normal  Pelvic: External genitalia:  no lesions              Urethra:  normal appearing urethra with no masses, tenderness or lesions              Bartholins and Skenes: normal                 Vagina: normal appearing vagina with normal color and discharge, no lesions              Cervix: no lesions                Bimanual Exam:  Uterus:  normal size, contour, position, consistency, mobility, non-tender              Adnexa: no mass, fullness, tenderness              Rectal exam: {yes no:[id number]}.  Confirms.              Anus:  normal sphincter tone, no lesions  Chaperone was  present for exam:  ***  ASSESSMENT     PLAN     An After  Visit Summary was printed and given to the patient.  ______ minutes face to face time of which over 50% was spent in counseling.

## 2022-05-11 ENCOUNTER — Other Ambulatory Visit: Payer: 59

## 2022-05-11 ENCOUNTER — Ambulatory Visit: Payer: 59 | Admitting: Obstetrics and Gynecology

## 2022-05-11 ENCOUNTER — Other Ambulatory Visit: Payer: 59 | Admitting: Obstetrics and Gynecology

## 2022-05-11 ENCOUNTER — Encounter: Payer: Self-pay | Admitting: Obstetrics and Gynecology

## 2022-05-11 ENCOUNTER — Other Ambulatory Visit (HOSPITAL_COMMUNITY)
Admission: RE | Admit: 2022-05-11 | Discharge: 2022-05-11 | Disposition: A | Discharge status: Home / Self Care | Payer: 59 | Source: Ambulatory Visit | Attending: Obstetrics and Gynecology | Admitting: Obstetrics and Gynecology

## 2022-05-11 DIAGNOSIS — N950 Postmenopausal bleeding: Secondary | ICD-10-CM | POA: Diagnosis not present

## 2022-05-11 NOTE — Patient Instructions (Signed)
Endometrial Biopsy  An endometrial biopsy is a procedure to remove tissue samples from the endometrium, which is the lining of the uterus. The tissue that is removed can then be checked under a microscope for disease. This procedure is used to diagnose conditions such as endometrial cancer, endometrial tuberculosis, polyps, or other inflammatory conditions. This procedure may also be used to investigate uterine bleeding to determine where you are in your menstrual cycle or how your hormone levels are affecting the lining of the uterus. Tell a health care provider about: Any allergies you have. All medicines you are taking, including vitamins, herbs, eye drops, creams, and over-the-counter medicines. Any problems you or family members have had with anesthetic medicines. Any blood disorders you have. Any surgeries you have had. Any medical conditions you have. Whether you are pregnant or may be pregnant. What are the risks? Generally, this is a safe procedure. However, problems may occur, including: Bleeding. Pelvic infection. Puncture of the wall of the uterus with the biopsy device (rare). Allergic reactions to medicines. What happens before the procedure? Keep a record of your menstrual cycles as told by your health care provider. You may need to schedule your procedure for a specific time in your cycle. You may want to bring a sanitary pad to wear after the procedure. Plan to have someone take you home from the hospital or clinic. Ask your health care provider about: Changing or stopping your regular medicines. This is especially important if you are taking diabetes medicines, arthritis medicines, or blood thinners. Taking medicines such as aspirin and ibuprofen. These medicines can thin your blood. Do not take these medicines unless your health care provider tells you to take them. Taking over-the-counter medicines, vitamins, herbs, and supplements. What happens during the  procedure? You will lie on an exam table with your feet and legs supported as in a pelvic exam. Your health care provider will insert an instrument (speculum) into your vagina to see your cervix. Your cervix will be cleansed with an antiseptic solution. A medicine (local anesthetic) will be used to numb the cervix. A forceps instrument (tenaculum) will be used to hold your cervix steady for the biopsy. A thin, rod-like instrument (uterine sound) will be inserted through your cervix to determine the length of your uterus and the location where the biopsy sample will be removed. A thin, flexible tube (catheter) will be inserted through your cervix and into the uterus. The catheter will be used to collect the biopsy sample from your endometrial tissue. The catheter and speculum will then be removed, and the tissue sample will be sent to a lab for examination. The procedure may vary among health care providers and hospitals. What can I expect after procedure? You will rest in a recovery area until you are ready to go home. You may have mild cramping and a small amount of vaginal bleeding. This is normal. You may have a small amount of vaginal bleeding for a few days. This is normal. It is up to you to get the results of your procedure. Ask your health care provider, or the department that is doing the procedure, when your results will be ready. Follow these instructions at home: Take over-the-counter and prescription medicines only as told by your health care provider. Do not douche, use tampons, or have sexual intercourse until your health care provider approves. Return to your normal activities as told by your health care provider. Ask your health care provider what activities are safe for you. Follow   instructions from your health care provider about any activity restrictions, such as restrictions on strenuous exercise or heavy lifting. Keep all follow-up visits. This is important. Contact a  health care provider: You have heavy bleeding, or bleed for longer than 2 days after the procedure. You have bad smelling discharge from your vagina. You have a fever or chills. You have a burning sensation when urinating or you have difficulty urinating. You have severe pain in your lower abdomen. Get help right away if you: You have severe cramps in your stomach or back. You pass large blood clots. Your bleeding increases. You become weak or light-headed, or you faint or lose consciousness. Summary An endometrial biopsy is a procedure to remove tissue samples is taken from the endometrium, which is the lining of the uterus. The tissue sample that is removed will be checked under a microscope for disease. This procedure is used to diagnose conditions such as endometrial cancer, endometrial tuberculosis, polyps, or other inflammatory conditions. After the procedure, it is common to have mild cramping and a small amount of vaginal bleeding for a few days. Do not douche, use tampons, or have sexual intercourse until your health care provider approves. Ask your health care provider which activities are safe for you. This information is not intended to replace advice given to you by your health care provider. Make sure you discuss any questions you have with your health care provider. Document Revised: 09/19/2021 Document Reviewed: 05/17/2020 Elsevier Patient Education  2023 Elsevier Inc.  

## 2022-05-11 NOTE — Addendum Note (Signed)
Addended by: Ardell Isaacs, Debbe Bales E on: 05/11/2022 01:22 PM   Modules accepted: Orders

## 2022-05-15 LAB — SURGICAL PATHOLOGY

## 2022-05-16 ENCOUNTER — Other Ambulatory Visit: Payer: Self-pay

## 2022-05-16 DIAGNOSIS — N840 Polyp of corpus uteri: Secondary | ICD-10-CM

## 2022-05-17 ENCOUNTER — Other Ambulatory Visit: Payer: Self-pay

## 2022-05-17 ENCOUNTER — Telehealth: Payer: Self-pay

## 2022-05-17 MED ORDER — MEDROXYPROGESTERONE ACETATE 10 MG PO TABS: 10.0000 mg | ORAL_TABLET | Freq: Every day | ORAL | 0 refills | Status: DC

## 2022-05-17 NOTE — Telephone Encounter (Signed)
Had endo biopsy 05/11/22. Reports light bleeding. Can wear a pad all day. Just sees a spot on it. However, in the morning after getting up she sees some light bleeding and a few clots.   She is scheduled for [facility] 06/22/22.

## 2022-05-17 NOTE — Telephone Encounter (Signed)
Spoke with patient and informed her. Rx sent. 

## 2022-05-17 NOTE — Telephone Encounter (Signed)
I recommend Provera 10 mg x 14 days.  This is progesterone hormone.   It may stop the bleeding and then cause a future bleed after she finishes the prescription.  Temporary side effects may be bloating, moodiness, and breast tenderness.   Please have her keep her sonohysterogram appointment.

## 2022-05-22 NOTE — Telephone Encounter (Signed)
[facility] scheduled 06/22/22.

## 2022-06-02 ENCOUNTER — Ambulatory Visit (INDEPENDENT_AMBULATORY_CARE_PROVIDER_SITE_OTHER): Payer: 59

## 2022-06-02 DIAGNOSIS — E538 Deficiency of other specified B group vitamins: Secondary | ICD-10-CM

## 2022-06-02 NOTE — Progress Notes (Signed)
Cyanocobalamin injection given to right deltoid.  Patient tolerated well. 

## 2022-06-12 ENCOUNTER — Telehealth: Payer: Self-pay | Admitting: *Deleted

## 2022-06-12 NOTE — Telephone Encounter (Signed)
Office visit with me on 06/13/22.

## 2022-06-12 NOTE — Telephone Encounter (Signed)
Patient called to follow up from telephone encounter on 05/17/22. She completed the Provera 10 mg tablet x 14 day. Patient said she finished pills on 05/31/22 had spotting for 3 days,then bled normal flow for 3 days. Reports bleeding now for 6 days,heavier changing pad every 2 hours. Her sonohysterogram is scheduled on 06/22/22. Please advise

## 2022-06-13 ENCOUNTER — Ambulatory Visit: Payer: 59 | Admitting: Obstetrics and Gynecology

## 2022-06-13 NOTE — Telephone Encounter (Signed)
Left message for patient to call ASAP

## 2022-06-13 NOTE — Telephone Encounter (Signed)
Patient called and canceled today and now scheduled on 06/14/22 @ 11:00am

## 2022-06-13 NOTE — Telephone Encounter (Signed)
Patient scheduled today at 1:30pm.

## 2022-06-14 ENCOUNTER — Encounter: Payer: Self-pay | Admitting: Obstetrics and Gynecology

## 2022-06-14 ENCOUNTER — Ambulatory Visit (INDEPENDENT_AMBULATORY_CARE_PROVIDER_SITE_OTHER): Payer: 59 | Admitting: Obstetrics and Gynecology

## 2022-06-14 VITALS — BP 120/76

## 2022-06-14 DIAGNOSIS — N950 Postmenopausal bleeding: Secondary | ICD-10-CM | POA: Diagnosis not present

## 2022-06-14 MED ORDER — MEDROXYPROGESTERONE ACETATE 10 MG PO TABS: 10.0000 mg | ORAL_TABLET | Freq: Every day | ORAL | 0 refills | Status: DC

## 2022-06-14 NOTE — Progress Notes (Signed)
GYNECOLOGY  VISIT   HPI: 51 y.o.   Single  African American  female   G0P0000 with Patient's last menstrual period was 06/04/2022.   here for heavy bleeding since 05-05-22. Patient states it is better today. This bleeding began after taking Provera. States she was changing pad every 2 hours when bleeding was heavy. Patient concerned about anemia. She also had low pelvic cramping and passed large clots.  Patient started bleeding after her endometrial biopsy and she was treated with Provera 10 mg x 10 days.   Last dose was 2 weeks ago. Bleeding stopped with Provera use.  When she stopped Provera, spotted for 3 days and then had a period for 10 days, heavy for 7 days.  Bleeding is improved today.   Pelvic US Uterus 8.07 x 4.16 x 3.98 cm.  No fibroids.  EMS 2.76 mm.  Symmetrical and no masses. No abnormal blood flow.  Left ovary 2.29 x 1.01 x 1.10 cm.  Right ovary 2.59 x 1.31 x 1.47 cm.  No adnexal masses.  No free fluid.   Endometrial biopsy showed benign polyp.   She is scheduled for a sonohysterogram.   FSH 61 and E2 16 on 03/16/21.  GYNECOLOGIC HISTORY: Patient's last menstrual period was 06/04/2022. Contraception:  Abstinence--No SA in 5-6 years Menopausal hormone therapy:  Provera Last mammogram:  03-02-21 Neg/BiRads1 Last pap smear:  03-23-22 Neg:Neg HR HPV, 09-26-19 Neg, 08-25-16 Neg:Neg HR HPV        OB History     Gravida  0   Para  0   Term  0   Preterm  0   AB  0   Living  0      SAB  0   IAB  0   Ectopic  0   Multiple  0   Live Births                 Patient Active Problem List   Diagnosis Date Noted   Neck pain 02/02/2022   GAD (generalized anxiety disorder) 12/16/2021   Primary hypertension 09/10/2020   GERD without esophagitis 05/02/2019   BMI 45.0-49.9, adult (HCC) 05/02/2019   Low serum vitamin B12 11/17/2016    Past Medical History:  Diagnosis Date   Anemia    Balance disorder    GERD (gastroesophageal reflux disease)    Low  vitamin D level 2022   MVP (mitral valve prolapse)    PVC's (premature ventricular contractions)    Sleep apnea    Dr. Frances Furbish- on APAP    Past Surgical History:  Procedure Laterality Date   NO SURGERIES      Current Outpatient Medications  Medication Sig Dispense Refill   amLODipine (NORVASC) 2.5 MG tablet Take 1 tablet (2.5 mg total) by mouth daily. 90 tablet 1   Carboxymethylcellul-Glycerin (CLEAR EYES FOR DRY EYES OP) Place 1-2 drops into both eyes daily as needed (dry eyes).     Cholecalciferol (VITAMIN D3) 50 MCG (2000 UT) TABS Take 1 tablet by mouth daily.     clindamycin (CLEOCIN-T) 1 % external solution Apply topically 2 (two) times daily. 30 mL 1   escitalopram (LEXAPRO) 10 MG tablet Take 1 tablet (10 mg total) by mouth daily. 90 tablet 1   fexofenadine (ALLEGRA) 180 MG tablet TAKE 1 TABLET BY MOUTH EVERY DAY 90 tablet 1   fluticasone (FLONASE) 50 MCG/ACT nasal spray SPRAY 2 SPRAYS INTO EACH NOSTRIL EVERY DAY (Patient taking differently: Place 2 sprays into both nostrils daily  as needed for allergies.) 48 mL 1   hydrochlorothiazide (HYDRODIURIL) 25 MG tablet Take 1 tablet (25 mg total) by mouth daily. 90 tablet 1   KRILL OIL PO Take 350 mg by mouth daily.      metoprolol tartrate (LOPRESSOR) 25 MG tablet TAKE 1/2 TABLET (12.5 MG TOTAL) BY MOUTH 2 (TWO) TIMES DAILY AS NEEDED (PALPITATIONS). 90 tablet 1   naproxen (NAPROSYN) 500 MG tablet TAKE 1 TABLET BY MOUTH 2 TIMES DAILY WITH A MEAL. 60 tablet 1   omeprazole (PRILOSEC) 20 MG capsule Take 1 capsule (20 mg total) by mouth daily. 90 capsule 1   Probiotic Product (PROBIOTIC PO) Take by mouth.     rosuvastatin (CRESTOR) 10 MG tablet Take 1 tablet (10 mg total) by mouth daily. 90 tablet 1   medroxyPROGESTERone (PROVERA) 10 MG tablet Take 1 tablet (10 mg total) by mouth daily. (Patient not taking: Reported on 06/14/2022) 14 tablet 0   Current Facility-Administered Medications  Medication Dose Route Frequency Provider Last Rate Last  Admin   cyanocobalamin ((VITAMIN B-12)) injection 1,000 mcg  1,000 mcg Intramuscular Q30 days Bennie Pierini, FNP   1,000 mcg at 06/02/22 4696     ALLERGIES: Eggs or egg-derived products, Fluarix [influenza virus vaccine], Other, Peanut-containing drug products, Penicillins, Amoxicillin, and Sulfa antibiotics  Family History  Problem Relation Age of Onset   Hypertension Mother    Diabetes Mother    Other Mother        renal disease   Hypertension Father    Heart attack Father    Hypertension Sister    Diabetes Brother    Diabetes Brother    Sleep apnea Neg Hx     Social History   Socioeconomic History   Marital status: Single    Spouse name: Not on file   Number of children: Not on file   Years of education: Not on file   Highest education level: Not on file  Occupational History   Not on file  Tobacco Use   Smoking status: Never   Smokeless tobacco: Never  Vaping Use   Vaping Use: Never used  Substance and Sexual Activity   Alcohol use: No    Alcohol/week: 0.0 standard drinks of alcohol   Drug use: No   Sexual activity: Not Currently    Partners: Male    Birth control/protection: Abstinence  Other Topics Concern   Not on file  Social History Narrative   Not on file   Social Determinants of Health   Financial Resource Strain: Not on file  Food Insecurity: Not on file  Transportation Needs: Not on file  Physical Activity: Not on file  Stress: Not on file  Social Connections: Not on file  Intimate Partner Violence: Not on file    Review of Systems  Genitourinary:  Positive for menstrual problem (heavy bleeding with Provera).  All other systems reviewed and are negative.   PHYSICAL EXAMINATION:    BP 120/76   LMP 06/04/2022     General appearance: alert, cooperative and appears stated age   Pelvic: External genitalia:  no lesions              Urethra:  normal appearing urethra with no masses, tenderness or lesions              Bartholins and  Skenes: normal                 Vagina: normal appearing vagina with normal color and discharge, no lesions  Cervix: no lesions.  Clot from the cervix.                Bimanual Exam:  Uterus:  normal size, contour, position, consistency, mobility, non-tender              Adnexa: no mass, fullness, tenderness       Chaperone was present for exam:  Marchelle Folks, CMA  ASSESSMENT  Postmenopausal bleeding.  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 Status post course of Provera.    PLAN  Restart Provera 10 mg x 14 days.  CBC, FSH, and estradiol today.  Return for sonohysterogram on 06/22/22.   An After Visit Summary was printed and given to the patient.

## 2022-06-15 ENCOUNTER — Telehealth: Payer: Self-pay | Admitting: Obstetrics and Gynecology

## 2022-06-15 LAB — CBC
HCT: 34 % — ABNORMAL LOW (ref 35.0–45.0)
Hemoglobin: 10.8 g/dL — ABNORMAL LOW (ref 11.7–15.5)
MCH: 28.1 pg (ref 27.0–33.0)
MCHC: 31.8 g/dL — ABNORMAL LOW (ref 32.0–36.0)
MCV: 88.3 fL (ref 80.0–100.0)
MPV: 11.1 fL (ref 7.5–12.5)
Platelets: 394 10*3/uL (ref 140–400)
RBC: 3.85 10*6/uL (ref 3.80–5.10)
RDW: 13.8 % (ref 11.0–15.0)
WBC: 7.9 10*3/uL (ref 3.8–10.8)

## 2022-06-15 LAB — ESTRADIOL: Estradiol: 17 pg/mL

## 2022-06-15 LAB — FOLLICLE STIMULATING HORMONE: FSH: 42.8 m[IU]/mL

## 2022-06-15 NOTE — Telephone Encounter (Signed)
Please precert and schedule hysteroscopy with Myosure resection of endometrial polyp, dilation and curettage at [facility].   Patient has postmenopausal bleeding and a polyp.   Time needed is 45 minutes.   Please try to schedule within the next 2 weeks.   She will need a preop with me.

## 2022-06-19 NOTE — Telephone Encounter (Signed)
[employer] -Patient left message, she will have new insurance to provide.

## 2022-06-19 NOTE — Telephone Encounter (Signed)
Spoke with patient. Reviewed surgery dates. Patient request to proceed with surgery on 07/11/22. Patient declined earlier dates offered.  Advised patient I will forward to business office for return call. I will return call once surgery date and time confirmed. Patient verbalizes understanding and is agreeable.   Surgery request sent.

## 2022-06-21 ENCOUNTER — Telehealth: Payer: Self-pay

## 2022-06-21 NOTE — Telephone Encounter (Signed)
Please give me an update on her bleeding so I can determine the plan for her Provera.

## 2022-06-21 NOTE — Telephone Encounter (Signed)
Patient said Dr. Edward Jolly prescribed Provera for her and she only has 5 pills left. Her outpatient surgery is scheduled for 07/11/22 and she questions did Dr. Edward Jolly want her to remain on this until surgery. If so she need another Rx.

## 2022-06-22 ENCOUNTER — Other Ambulatory Visit: Payer: 59 | Admitting: Obstetrics and Gynecology

## 2022-06-22 ENCOUNTER — Other Ambulatory Visit: Payer: 59

## 2022-06-22 MED ORDER — MEDROXYPROGESTERONE ACETATE 10 MG PO TABS: 10.0000 mg | ORAL_TABLET | Freq: Every day | ORAL | 0 refills | Status: DC

## 2022-06-22 NOTE — Telephone Encounter (Signed)
Spoke with patient and advised/instructed. Rx sent.

## 2022-06-22 NOTE — Telephone Encounter (Signed)
Please have her continue to take the Provera 10 mg by mouth daily until the day of surgery.   Her last pill will be the day prior to her surgery.   Thank you.

## 2022-06-22 NOTE — Telephone Encounter (Signed)
Spoke with patient. Surgery date request confirmed.  Advised surgery is scheduled for 07/11/22, [facility] at 0730.  Surgery instruction sheet and hospital brochure reviewed, printed copy will be mailed.  Patient verbalizes understanding and is agreeable.  Routing to [facility], encounter may be closed once benefits reviewed.

## 2022-06-22 NOTE — Telephone Encounter (Signed)
Spoke with patient. Her bleeding has completely stopped.  It stopped on the 2nd day of taking Provera.

## 2022-06-26 ENCOUNTER — Encounter (HOSPITAL_BASED_OUTPATIENT_CLINIC_OR_DEPARTMENT_OTHER): Payer: Self-pay | Admitting: Obstetrics and Gynecology

## 2022-06-27 ENCOUNTER — Encounter (HOSPITAL_BASED_OUTPATIENT_CLINIC_OR_DEPARTMENT_OTHER): Payer: Self-pay | Admitting: Obstetrics and Gynecology

## 2022-06-27 NOTE — Telephone Encounter (Signed)
Spoke with patient regarding surgery benefits. Patient acknowledges understanding of information presented. Patient is aware that benefits presented are professional benefits only. Patient is aware the hospital will call with facility benefits. See account note.Front desk aware of surgery payment

## 2022-06-27 NOTE — Progress Notes (Addendum)
Spoke w/ via phone for pre-op interview--- pt Lab needs dos----  [employer], ekg (per anes)/  pre-op orders pending             Lab results------ no COVID test -----patient states asymptomatic no test needed Arrive at ------- 0530 on 07-11-2022 NPO after MN NO Solid Food.  Clear liquids from MN until--- 0430 Med rec completed Medications to take morning of surgery ----- prilosec, lopressor, crestor Diabetic medication ----- n/a Patient instructed no nail polish to be worn day of surgery Patient instructed to bring photo id and insurance card day of surgery Patient aware to have Driver (ride ) / caregiver  for 24 hours after surgery --- sister , hazel Patient Special Instructions ----- n/a Pre-Op special Istructions ----- n/a Patient verbalized understanding of instructions that were given at this phone interview. Patient denies shortness of breath, chest pain, fever, cough at this phone interview.    Anesthesia Review: HTN;  PVCs;  severe OSA , pt stated uses cpap nightly;  pre-diabetic; BMI 49.92  (5'5"  300 lb stated ) Pt denies cardiac s&s, sob, and no peripheral swelling.  Also denies dizziness /  lightheadedness/ and no near syncope/ syncope.  PCP:  Mary-Margeret Daphine Deutscher NP (lov 03-10-2022 epic) Cardiologist :  Dr Wyline Mood ([facility] 06-21-2021 epic) EKG :  05-08-2021 Echo : 10-21-2020 Stress test: nuclear 10-21-2020 Cardiac Cath :  no Activity level: denies sob w/ any acitiby Sleep Study/ CPAP : Yes/ Yes  Blood Thinner/ Instructions /Last Dose: no ASA / Instructions/ Last Dose :  no

## 2022-07-03 ENCOUNTER — Ambulatory Visit: Payer: 59

## 2022-07-04 NOTE — Progress Notes (Addendum)
GYNECOLOGY  VISIT   HPI: 51 y.o.   Single  African American  female   G0P0000 with Patient's last menstrual period was 06/04/2022.   here for surgical consultation.  Her sister is present for the visit today also.  Patient has postmenopausal bleeding and an endometrial biopsy showing proliferative endometrium and a benign polyp.   Pelvic US 06/14/22: Uterus 8.07 x 4.16 x 3.98 cm.  No fibroids.  EMS 2.76 mm.  Symmetrical and no masses. No abnormal blood flow.  Left ovary 2.29 x 1.01 x 1.10 cm.  Right ovary 2.59 x 1.31 x 1.47 cm.  No adnexal masses.  No free fluid.   Her bleeding has been controlled on Provera 10 mg daily.  No further bleeding.   Hgb 10.8 on 06/14/22.  FSH 42.8 and estradiol 17 on 06/14/22.    A1C 5.6 on 03/10/22.   GYNECOLOGIC HISTORY: Patient's last menstrual period was 06/04/2022. Contraception:  Abstinence Menopausal hormone therapy:  none Last mammogram:  03-02-21 density A, BIRADS 1 negative.  She will do at [facility] at September.  Last pap smear:  03/23/22 - normal, neg HR HPV, 09-26-19 Neg, 08-25-16 Neg:Neg HR HPV        OB History     Gravida  0   Para  0   Term  0   Preterm  0   AB  0   Living  0      SAB  0   IAB  0   Ectopic  0   Multiple  0   Live Births                 Patient Active Problem List   Diagnosis Date Noted   Neck pain 02/02/2022   GAD (generalized anxiety disorder) 12/16/2021   Primary hypertension 09/10/2020   GERD without esophagitis 05/02/2019   BMI 45.0-49.9, adult (HCC) 05/02/2019   Low serum vitamin B12 11/17/2016    Past Medical History:  Diagnosis Date   Anemia    Arthritis    neck, right foot, left shoulder   Balance disorder    06-27-2022  per pt at times "feels off balance without dizziness" , stated has had neurology/ cardiology/ and ENT work-up without etiology   Endometrial polyp    GERD (gastroesophageal reflux disease)    Hypertension    MVP (mitral valve prolapse)    no evidence per  echo in epic 10-21-2020, has trivial mitral valve regurg   OSA on CPAP 10/2021   followed by neurologist--- dr Frances Furbish;  sleep study in epic 10-26-2021 severe osa ( 06-27-2022 per pt uses apap nightly)   PMB (postmenopausal bleeding)    Pre-diabetes    PVC's (premature ventricular contractions)    followed by cardiologist--- dr branch;  last event monitor 11-11-2020  frequent PVCs (6%), NSR w/ rare SVT;   low risk nuclear study 10-21-2020 w/ no ischemia   Vitamin B 12 deficiency    Vitamin D deficiency 2022   Wears glasses     Past Surgical History:  Procedure Laterality Date   NO PAST SURGERIES      Current Outpatient Medications  Medication Sig Dispense Refill   acetaminophen (TYLENOL) 650 MG CR tablet Take 1,300 mg by mouth daily.     amLODipine (NORVASC) 2.5 MG tablet Take 1 tablet (2.5 mg total) by mouth daily. (Patient taking differently: Take 2.5 mg by mouth at bedtime.) 90 tablet 1   Carboxymethylcellul-Glycerin (CLEAR EYES FOR DRY EYES OP) Place 1-2 drops  into both eyes daily as needed (dry eyes).     Cholecalciferol (VITAMIN D3) 50 MCG (2000 UT) TABS Take 1 tablet by mouth daily after lunch.     clindamycin (CLEOCIN-T) 1 % external solution Apply topically 2 (two) times daily. (Patient taking differently: Apply topically 2 (two) times daily as needed.) 30 mL 1   escitalopram (LEXAPRO) 10 MG tablet Take 1 tablet (10 mg total) by mouth daily. (Patient taking differently: Take 10 mg by mouth at bedtime.) 90 tablet 1   ferrous sulfate 325 (65 FE) MG EC tablet Take 325 mg by mouth 2 (two) times daily.     fexofenadine (ALLEGRA) 180 MG tablet TAKE 1 TABLET BY MOUTH EVERY DAY (Patient taking differently: Take 180 mg by mouth daily after lunch.) 90 tablet 1   fluticasone (FLONASE) 50 MCG/ACT nasal spray SPRAY 2 SPRAYS INTO EACH NOSTRIL EVERY DAY (Patient taking differently: Place 2 sprays into both nostrils daily as needed for allergies.) 48 mL 1   hydrochlorothiazide (HYDRODIURIL) 25 MG  tablet Take 1 tablet (25 mg total) by mouth daily. (Patient taking differently: Take 25 mg by mouth daily.) 90 tablet 1   KRILL OIL PO Take 350 mg by mouth daily.      medroxyPROGESTERone (PROVERA) 10 MG tablet Take 1 tablet (10 mg total) by mouth daily. (Patient taking differently: Take 10 mg by mouth daily after lunch.) 14 tablet 0   metoprolol tartrate (LOPRESSOR) 25 MG tablet TAKE 1/2 TABLET (12.5 MG TOTAL) BY MOUTH 2 (TWO) TIMES DAILY AS NEEDED (PALPITATIONS). (Patient taking differently: Take 12.5 mg by mouth daily. TAKE 1/2 TABLET (12.5 MG TOTAL) BY MOUTH DAILY and AS NEEDED (PALPITATIONS) up to twice daily) 90 tablet 1   omeprazole (PRILOSEC) 20 MG capsule Take 1 capsule (20 mg total) by mouth daily. 90 capsule 1   Probiotic Product (PROBIOTIC PO) Take by mouth daily.     rosuvastatin (CRESTOR) 10 MG tablet Take 1 tablet (10 mg total) by mouth daily. (Patient taking differently: Take 10 mg by mouth daily.) 90 tablet 1   Current Facility-Administered Medications  Medication Dose Route Frequency Provider Last Rate Last Admin   cyanocobalamin ((VITAMIN B-12)) injection 1,000 mcg  1,000 mcg Intramuscular Q30 days Bennie Pierini, FNP   1,000 mcg at 06/02/22 1601     ALLERGIES: Eggs or egg-derived products, Fluarix [influenza virus vaccine], Other, Peanut-containing drug products, Penicillins, Blueberry flavor, Kiwi extract, Watermelon flavor, Amoxicillin, and Sulfa antibiotics  Family History  Problem Relation Age of Onset   Hypertension Mother    Diabetes Mother    Other Mother        renal disease   Hypertension Father    Heart attack Father    Hypertension Sister    Diabetes Brother    Diabetes Brother    Sleep apnea Neg Hx     Social History   Socioeconomic History   Marital status: Single    Spouse name: Not on file   Number of children: Not on file   Years of education: Not on file   Highest education level: Not on file  Occupational History   Not on file   Tobacco Use   Smoking status: Never   Smokeless tobacco: Never  Vaping Use   Vaping Use: Never used  Substance and Sexual Activity   Alcohol use: No    Alcohol/week: 0.0 standard drinks of alcohol   Drug use: Never   Sexual activity: Not Currently    Partners: Male  Birth control/protection: Abstinence  Other Topics Concern   Not on file  Social History Narrative   Not on file   Social Determinants of Health   Financial Resource Strain: Not on file  Food Insecurity: Not on file  Transportation Needs: Not on file  Physical Activity: Not on file  Stress: Not on file  Social Connections: Not on file  Intimate Partner Violence: Not on file    Review of Systems  See HPI.   PHYSICAL EXAMINATION:    BP 112/74   Pulse (!) 56   Ht 5' 5.25" (1.657 m)   Wt (!) 302 lb (137 kg)   LMP 06/04/2022   SpO2 97%   BMI 49.87 kg/m     General appearance: alert, cooperative and appears stated age Head: Normocephalic, without obvious abnormality, atraumatic Neck: no adenopathy, supple, symmetrical, trachea midline and thyroid normal to inspection and palpation Lungs: clear to auscultation bilaterally Heart: regular rate and rhythm Abdomen: soft, non-tender, no masses,  no organomegaly Extremities: extremities normal, atraumatic, no cyanosis or edema Skin: Skin color, texture, turgor normal. No rashes or lesions Lymph nodes: Cervical, supraclavicular, and axillary nodes normal. No abnormal inguinal nodes palpated Neurologic: Grossly normal  Pelvic: External genitalia:  no lesions              Urethra:  normal appearing urethra with no masses, tenderness or lesions              Bartholins and Skenes: normal                 Vagina: normal appearing vagina with normal color and discharge, no lesions              Cervix: no lesions                Bimanual Exam:  Uterus:  normal size, contour, position, consistency, mobility, non-tender              Adnexa: no mass, fullness,  tenderness       Chaperone was present for exam:  Marchelle Folks, CMA  ASSESSMENT  Postmenopausal bleeding.   Endometrial polyp.  Proliferative endometrium.  PLAN  We reviewed proliferative endometrium and endometrial polyps.  We discussed hysteroscopy with Myosure polypectomy, dilation and curettage.  Risks, benefits, and alternatives reviewed. Risks include but are not limited to bleeding, infection, damage to surrounding organs including uterine perforation requiring laparoscopy, pulmonary edema, reaction to anesthesia, DVT, PE, death, need for further treatment including medical therapy or surgery     Surgical expectations and recovery discussed.   Patient wishes to proceed.  I told her I anticipate post op treatment may be Provera 10 mg daily versus placement of a Mirena IUD in conjunction with endometrial biopsy every 3 months for a year to document regression of the endometrial proliferation.   Final plan to follow review of surgical pathology specimen.   An After Visit Summary was printed and given to the patient.  39 min  total time was spent for this patient encounter, including preparation, face-to-face counseling with the patient, coordination of care, and documentation of the encounter.

## 2022-07-05 ENCOUNTER — Encounter: Payer: Self-pay | Admitting: Obstetrics and Gynecology

## 2022-07-05 ENCOUNTER — Ambulatory Visit (INDEPENDENT_AMBULATORY_CARE_PROVIDER_SITE_OTHER): Payer: 59 | Admitting: Obstetrics and Gynecology

## 2022-07-05 VITALS — BP 112/74 | HR 56 | Ht 65.25 in | Wt 302.0 lb

## 2022-07-05 DIAGNOSIS — N950 Postmenopausal bleeding: Secondary | ICD-10-CM

## 2022-07-05 DIAGNOSIS — N840 Polyp of corpus uteri: Secondary | ICD-10-CM

## 2022-07-07 ENCOUNTER — Ambulatory Visit (INDEPENDENT_AMBULATORY_CARE_PROVIDER_SITE_OTHER): Payer: 59

## 2022-07-07 DIAGNOSIS — E538 Deficiency of other specified B group vitamins: Secondary | ICD-10-CM

## 2022-07-07 NOTE — Progress Notes (Signed)
Cyanocobalamin injection given to left deltoid.  Patient tolerated well. 

## 2022-07-11 ENCOUNTER — Ambulatory Visit (HOSPITAL_BASED_OUTPATIENT_CLINIC_OR_DEPARTMENT_OTHER)
Admission: RE | Admit: 2022-07-11 | Discharge: 2022-07-11 | Disposition: A | Discharge status: Home / Self Care | Payer: 59 | Attending: Obstetrics and Gynecology | Admitting: Obstetrics and Gynecology

## 2022-07-11 ENCOUNTER — Encounter (HOSPITAL_BASED_OUTPATIENT_CLINIC_OR_DEPARTMENT_OTHER)
Admission: RE | Disposition: A | Discharge status: Home / Self Care | Payer: Self-pay | Source: Home / Self Care | Attending: Obstetrics and Gynecology

## 2022-07-11 ENCOUNTER — Other Ambulatory Visit: Payer: Self-pay

## 2022-07-11 ENCOUNTER — Encounter (HOSPITAL_BASED_OUTPATIENT_CLINIC_OR_DEPARTMENT_OTHER): Payer: Self-pay | Admitting: Obstetrics and Gynecology

## 2022-07-11 ENCOUNTER — Telehealth: Payer: Self-pay | Admitting: *Deleted

## 2022-07-11 ENCOUNTER — Ambulatory Visit (HOSPITAL_BASED_OUTPATIENT_CLINIC_OR_DEPARTMENT_OTHER): Payer: 59 | Admitting: Anesthesiology

## 2022-07-11 DIAGNOSIS — G4733 Obstructive sleep apnea (adult) (pediatric): Secondary | ICD-10-CM

## 2022-07-11 DIAGNOSIS — Z9989 Dependence on other enabling machines and devices: Secondary | ICD-10-CM | POA: Diagnosis not present

## 2022-07-11 DIAGNOSIS — I10 Essential (primary) hypertension: Secondary | ICD-10-CM

## 2022-07-11 DIAGNOSIS — N840 Polyp of corpus uteri: Secondary | ICD-10-CM

## 2022-07-11 DIAGNOSIS — N950 Postmenopausal bleeding: Secondary | ICD-10-CM | POA: Diagnosis not present

## 2022-07-11 DIAGNOSIS — Z01818 Encounter for other preprocedural examination: Secondary | ICD-10-CM

## 2022-07-11 HISTORY — DX: Deficiency of other specified B group vitamins: E53.8

## 2022-07-11 HISTORY — PX: DILATATION & CURETTAGE/HYSTEROSCOPY WITH MYOSURE: SHX6511

## 2022-07-11 HISTORY — DX: Unspecified osteoarthritis, unspecified site: M19.90

## 2022-07-11 HISTORY — DX: Presence of spectacles and contact lenses: Z97.3

## 2022-07-11 HISTORY — DX: Prediabetes: R73.03

## 2022-07-11 HISTORY — DX: Postmenopausal bleeding: N95.0

## 2022-07-11 HISTORY — DX: Polyp of corpus uteri: N84.0

## 2022-07-11 HISTORY — DX: Essential (primary) hypertension: I10

## 2022-07-11 LAB — CBC
HCT: 37.7 % (ref 36.0–46.0)
Hemoglobin: 12 g/dL (ref 12.0–15.0)
MCH: 28.2 pg (ref 26.0–34.0)
MCHC: 31.8 g/dL (ref 30.0–36.0)
MCV: 88.5 fL (ref 80.0–100.0)
Platelets: 372 10*3/uL (ref 150–400)
RBC: 4.26 MIL/uL (ref 3.87–5.11)
RDW: 15.7 % — ABNORMAL HIGH (ref 11.5–15.5)
WBC: 5.7 10*3/uL (ref 4.0–10.5)
nRBC: 0 % (ref 0.0–0.2)

## 2022-07-11 LAB — POCT I-STAT, CHEM 8
BUN: 23 mg/dL — ABNORMAL HIGH (ref 6–20)
Calcium, Ion: 1.1 mmol/L — ABNORMAL LOW (ref 1.15–1.40)
Chloride: 106 mmol/L (ref 98–111)
Creatinine, Ser: 0.7 mg/dL (ref 0.44–1.00)
Glucose, Bld: 106 mg/dL — ABNORMAL HIGH (ref 70–99)
HCT: 39 % (ref 36.0–46.0)
Hemoglobin: 13.3 g/dL (ref 12.0–15.0)
Potassium: 3.8 mmol/L (ref 3.5–5.1)
Sodium: 138 mmol/L (ref 135–145)
TCO2: 20 mmol/L — ABNORMAL LOW (ref 22–32)

## 2022-07-11 LAB — POCT PREGNANCY, URINE: Preg Test, Ur: NEGATIVE

## 2022-07-11 SURGERY — DILATATION & CURETTAGE/HYSTEROSCOPY WITH MYOSURE
Anesthesia: General | Site: Vagina

## 2022-07-11 MED ORDER — IBUPROFEN 600 MG PO TABS: 600.0000 mg | ORAL_TABLET | Freq: Four times a day (QID) | ORAL | 0 refills | Status: DC | PRN

## 2022-07-11 MED ORDER — FENTANYL CITRATE (PF) 100 MCG/2ML IJ SOLN
INTRAMUSCULAR | Status: AC
  Filled 2022-07-11: qty 2

## 2022-07-11 MED ORDER — OXYCODONE HCL 5 MG PO TABS: 5.0000 mg | ORAL_TABLET | Freq: Once | ORAL | Status: DC | PRN

## 2022-07-11 MED ORDER — ACETAMINOPHEN 10 MG/ML IV SOLN: 1000.0000 mg | Freq: Once | INTRAVENOUS | Status: DC | PRN

## 2022-07-11 MED ORDER — KETOROLAC TROMETHAMINE 30 MG/ML IJ SOLN
INTRAMUSCULAR | Status: DC | PRN
  Administered 2022-07-11: 30 mg via INTRAVENOUS

## 2022-07-11 MED ORDER — ACETAMINOPHEN 325 MG PO TABS: 325.0000 mg | ORAL_TABLET | ORAL | Status: DC | PRN

## 2022-07-11 MED ORDER — FENTANYL CITRATE (PF) 100 MCG/2ML IJ SOLN: 25.0000 ug | INTRAMUSCULAR | Status: DC | PRN

## 2022-07-11 MED ORDER — AMISULPRIDE (ANTIEMETIC) 5 MG/2ML IV SOLN
10.0000 mg | Freq: Once | INTRAVENOUS | Status: DC | PRN
Start: 2022-07-11 — End: 2022-07-11

## 2022-07-11 MED ORDER — LIDOCAINE 2% (20 MG/ML) 5 ML SYRINGE
INTRAMUSCULAR | Status: DC | PRN
  Administered 2022-07-11: 60 mg via INTRAVENOUS

## 2022-07-11 MED ORDER — MIDAZOLAM HCL 2 MG/2ML IJ SOLN
INTRAMUSCULAR | Status: AC
  Filled 2022-07-11: qty 2

## 2022-07-11 MED ORDER — PROPOFOL 10 MG/ML IV BOLUS
INTRAVENOUS | Status: DC | PRN
  Administered 2022-07-11: 200 mg via INTRAVENOUS

## 2022-07-11 MED ORDER — ACETAMINOPHEN 160 MG/5ML PO SOLN: 325.0000 mg | ORAL | Status: DC | PRN

## 2022-07-11 MED ORDER — OXYCODONE HCL 5 MG/5ML PO SOLN: 5.0000 mg | Freq: Once | ORAL | Status: DC | PRN

## 2022-07-11 MED ORDER — PROMETHAZINE HCL 25 MG/ML IJ SOLN: 6.2500 mg | INTRAMUSCULAR | Status: DC | PRN

## 2022-07-11 MED ORDER — ONDANSETRON HCL 4 MG/2ML IJ SOLN
INTRAMUSCULAR | Status: DC | PRN
  Administered 2022-07-11: 4 mg via INTRAVENOUS

## 2022-07-11 MED ORDER — MEDROXYPROGESTERONE ACETATE 10 MG PO TABS: 10.0000 mg | ORAL_TABLET | Freq: Every day | ORAL | 2 refills | Status: DC

## 2022-07-11 MED ORDER — SODIUM CHLORIDE 0.9 % IR SOLN
Status: DC | PRN
  Administered 2022-07-11: 3000 mL

## 2022-07-11 MED ORDER — LACTATED RINGERS IV SOLN: INTRAVENOUS | Status: DC

## 2022-07-11 MED ORDER — DEXAMETHASONE SODIUM PHOSPHATE 10 MG/ML IJ SOLN
INTRAMUSCULAR | Status: DC | PRN
  Administered 2022-07-11: 10 mg via INTRAVENOUS

## 2022-07-11 MED ORDER — LIDOCAINE HCL 1 % IJ SOLN
INTRAMUSCULAR | Status: DC | PRN
  Administered 2022-07-11: 10 mL

## 2022-07-11 MED ORDER — PROPOFOL 10 MG/ML IV BOLUS
INTRAVENOUS | Status: AC
  Filled 2022-07-11: qty 20

## 2022-07-11 MED ORDER — POVIDONE-IODINE 10 % EX SWAB: 2.0000 | Freq: Once | CUTANEOUS | Status: DC

## 2022-07-11 MED ORDER — SILVER NITRATE-POT NITRATE 75-25 % EX MISC
CUTANEOUS | Status: DC | PRN
  Administered 2022-07-11: 2

## 2022-07-11 MED ORDER — FENTANYL CITRATE (PF) 250 MCG/5ML IJ SOLN
INTRAMUSCULAR | Status: DC | PRN
  Administered 2022-07-11: 50 ug via INTRAVENOUS
  Administered 2022-07-11 (×2): 25 ug via INTRAVENOUS

## 2022-07-11 MED ORDER — SCOPOLAMINE 1 MG/3DAYS TD PT72: 1.0000 | MEDICATED_PATCH | TRANSDERMAL | Status: DC

## 2022-07-11 MED ORDER — MIDAZOLAM HCL 2 MG/2ML IJ SOLN
INTRAMUSCULAR | Status: DC | PRN
  Administered 2022-07-11: 2 mg via INTRAVENOUS

## 2022-07-11 SURGICAL SUPPLY — 15 items
CATH SILICONE 16FRX5CC (CATHETERS) IMPLANT
DEVICE MYOSURE REACH (MISCELLANEOUS) IMPLANT
GLOVE BIOGEL PI IND STRL 6.5 (GLOVE) IMPLANT
GLOVE BIOGEL PI IND STRL 7.0 (GLOVE) IMPLANT
GLOVE SURG SS PI 6.5 STRL IVOR (GLOVE) IMPLANT
GOWN STRL REUS W/ TWL LRG LVL3 (GOWN DISPOSABLE) IMPLANT
GOWN STRL REUS W/TWL LRG LVL3 (GOWN DISPOSABLE) ×2 IMPLANT
IV NS IRRIG 3000ML ARTHROMATIC (IV SOLUTION) ×1 IMPLANT
KIT PROCEDURE FLUENT (KITS) ×1 IMPLANT
KIT TURNOVER CYSTO (KITS) ×1 IMPLANT
PACK VAGINAL MINOR WOMEN LF (CUSTOM PROCEDURE TRAY) ×1 IMPLANT
PAD OB MATERNITY 4.3X12.25 (PERSONAL CARE ITEMS) ×1 IMPLANT
SEAL ROD LENS SCOPE MYOSURE (ABLATOR) ×1 IMPLANT
SPIKE FLUID TRANSFER (MISCELLANEOUS) IMPLANT
TOWEL OR 17X26 10 PK STRL BLUE (TOWEL DISPOSABLE) ×1 IMPLANT

## 2022-07-11 NOTE — Anesthesia Procedure Notes (Signed)
Procedure Name: LMA Insertion Date/Time: 07/11/2022 7:34 AM  Performed by: Dairl Ponder, CRNAPre-anesthesia Checklist: Patient identified, Emergency Drugs available, Suction available and Patient being monitored Patient Re-evaluated:Patient Re-evaluated prior to induction Oxygen Delivery Method: Circle System Utilized Preoxygenation: Pre-oxygenation with 100% oxygen Induction Type: IV induction Ventilation: Mask ventilation without difficulty LMA: LMA inserted LMA Size: 4.0 Number of attempts: 1 Airway Equipment and Method: Bite block Placement Confirmation: positive ETCO2 Tube secured with: Tape Dental Injury: Teeth and Oropharynx as per pre-operative assessment

## 2022-07-11 NOTE — Telephone Encounter (Signed)
Patient informed. 

## 2022-07-11 NOTE — Op Note (Signed)
OPERATIVE REPORT   PREOPERATIVE DIAGNOSES:   Postmenopausal bleeding, endometrial polyp, proliferative endometrium  POSTOPERATIVE DIAGNOSES:   Postmenopausal bleeding, endometrial polyps, proliferative endometrium  PROCEDURE:  Hysteroscopy with dilation and curettage and Myosure resection of endometrial polyps  SURGEON:  Randye Lobo, MD  ANESTHESIA:  LMA, paracervical block with 10 mL of 1% lidocaine.  IV FLUIDS:  500 cc LR  EBL:  10 cc  URINE OUTPUT:   50 cc  NORMAL SALINE DEFICIT:   115 cc NS   COMPLICATIONS:  None.  INDICATIONS FOR THE PROCEDURE:     The patient is a 51 year old G0P0 African American female who presents with postmenopausal bleeding.  Pelvic ultrasound showed a normal uterus with an endometrial stripe of 2.76 mm.  Her ovaries were normal.  An endometrial biopsy documented an endometrial polyp and proliferative endometrium.  The patient has been on Provera 10 mg daily to control her bleeding.    A plan is now made to proceed with a hysteroscopy with dilation and curettage and Myosurgical resection of endometrial polyp; after risks, benefits and alternatives were reviewed.  FINDINGS:  Exam under anesthesia revealed a small anteverted, mobile uterus.  No adnexal masses were noted.  The uterus was sounded to 8 cm  Hysteroscopy showed multiple tiny polyps throughout the endometrial cavity.   The tubal ostia regions were normal.  Endometrial currettings were scant  SPECIMENS:   endometrial polyps and endometrial curettings were sent to pathology separately.   PROCEDURE IN DETAIL:  The patient was reidentified in the preoperative hold area.  She received TED hose and PAS stockings for DVT prophylaxis.  In the operating room, the patient was placed in the dorsal lithotomy position and then an LMA anesthetic was introduced.  The patient's lower abdomen, vagina and perineum were sterilely prepped with Betadine and the  patient's bladder was catheterized of  urine.  She was sterilly draped.  An exam under anesthesia was performed.  A speculum was placed inside the vagina and a single-tooth tenaculum was placed on the anterior cervical lip.  A paracervical block was performed with a total of 10 mL of 1% lidocaine plain.  The uterus was sounded. The cervix was dilated to a #19 Pratt dilator.  The MyoSure hysteroscope was then inserted inside the uterine cavity under the continuous infusion of normal saline solution.   Findings are as noted above.  The Myosure Reach device was used to resect the endometrial polyps without difficulty.  The MyoSure hysteroscope was removed. The cervix was further dilated to a #23 Pratt dilator.   The serrated and then sharp curettes were introduced into the uterine cavity and the endometrium was curetted in all 4 quadrants.   A scant amount of endometrial curettings was obtained.  This specimen was sent to Pathology.  The single-tooth tenaculum which had been placed on the anterior cervical lip was removed.    The tenaculum site was cauterized with silver nitrate.   Hemostasis was good, and all of the vaginal instruments were removed.  The patient was awakened and escorted to the recovery room in stable condition after she was cleansed of Betadine.  There were no complications to the procedure.   All needle, instrument and sponge counts were correct.  Randye Lobo, MD

## 2022-07-11 NOTE — Progress Notes (Signed)
Update to History and Physical  No marked change in status since office preop visit.  No vaginal bleeding.  Last dose of Provera was yesterday.  Patient examined.  Hgb 13.3.  OK to proceed with surgery.

## 2022-07-11 NOTE — Discharge Instructions (Addendum)
Hi Renee Thomas,   Everything went well today!  I found and removed several tiny polyps.  I also did the curettage of the tissue surrounding the polyps.   You will start your Provera back today.   You do not need to restart your iron at this time.   Conley Simmonds, MD   Post Anesthesia Home Care Instructions  Activity: Get plenty of rest for the remainder of the day. A responsible individual must stay with you for 24 hours following the procedure.  For the next 24 hours, DO NOT: -Drive a car -Advertising copywriter -Drink alcoholic beverages -Take any medication unless instructed by your physician -Make any legal decisions or sign important papers.  Meals: Start with liquid foods such as gelatin or soup. Progress to regular foods as tolerated. Avoid greasy, spicy, heavy foods. If nausea and/or vomiting occur, drink only clear liquids until the nausea and/or vomiting subsides. Call your physician if vomiting continues.  Special Instructions/Symptoms: Your throat may feel dry or sore from the anesthesia or the breathing tube placed in your throat during surgery. If this causes discomfort, gargle with warm salt water. The discomfort should disappear within 24 hours.  If you had a scopolamine patch placed behind your ear for the management of post- operative nausea and/or vomiting:  1. The medication in the patch is effective for 72 hours, after which it should be removed.  Wrap patch in a tissue and discard in the trash. Wash hands thoroughly with soap and water. 2. You may remove the patch earlier than 72 hours if you experience unpleasant side effects which may include dry mouth, dizziness or visual disturbances. 3. Avoid touching the patch. Wash your hands with soap and water after contact with the patch.   No ibuprofen, Advil, Aleve, Motrin, ketorolac, meloxicam, naproxen, or other NSAIDS until after 2 pm today if needed.

## 2022-07-11 NOTE — H&P (Signed)
Office Visit  07/05/2022 [facility], Forrestine Him, MD Obstetrics and Gynecology Postmenopausal bleeding +1 more Dx Pre-op Exam ; Referred by Bennie Pierini, FNP Reason for Visit   Additional Documentation  Vitals:  BP 112/74 Pulse 56 Important   Ht 5' 5.25" (1.657 m) Wt 137 kg Important   LMP 06/04/2022 SpO2 97% BMI 49.87 kg/m BSA 2.51 m  More Vitals  Flowsheets:  Anthropometrics, NEWS, MEWS Score, Method of Visit   Encounter Info:  Billing Info, History, Allergies, Detailed Report    All Notes    Progress Notes by Patton Salles, MD at 07/05/2022 12:00 PM  Author: Patton Salles, MD Author Type: Physician Filed: 07/05/2022 12:59 PM  Note Status: Signed Cosign: Cosign Not Required Encounter Date: 07/05/2022  Editor: Patton Salles, MD (Physician)      Prior Versions: 1. Alphonsa Overall, CMA (Certified Engineer, site) at 07/05/2022 11:50 AM - Sign when Signing Visit    GYNECOLOGY  VISIT   HPI: 51 y.o.   Single  African American  female   G0P0000 with Patient's last menstrual period was 06/04/2022.   here for surgical consultation.  Her sister is present for the visit today also.   Patient has postmenopausal bleeding and an endometrial biopsy showing proliferative endometrium and a benign polyp.    Pelvic US 06/14/22: Uterus 8.07 x 4.16 x 3.98 cm.  No fibroids.  EMS 2.76 mm.  Symmetrical and no masses. No abnormal blood flow.  Left ovary 2.29 x 1.01 x 1.10 cm.  Right ovary 2.59 x 1.31 x 1.47 cm.  No adnexal masses.  No free fluid.    Her bleeding has been controlled on Provera 10 mg daily.  No further bleeding.    Hgb 10.8 on 06/14/22.  FSH 42.8 and estradiol 17 on 06/14/22.     A1C 5.6 on 03/10/22.    GYNECOLOGIC HISTORY: Patient's last menstrual period was 06/04/2022. Contraception:  Abstinence Menopausal hormone therapy:  none Last mammogram:  03-02-21 density A, BIRADS 1  negative.  She will do at [facility] at September.  Last pap smear:  03/23/22 - normal, neg HR HPV, 09-26-19 Neg, 08-25-16 Neg:Neg HR HPV        OB History       Gravida  0   Para  0   Term  0   Preterm  0   AB  0   Living  0        SAB  0   IAB  0   Ectopic  0   Multiple  0   Live Births                        Patient Active Problem List    Diagnosis Date Noted   Neck pain 02/02/2022   GAD (generalized anxiety disorder) 12/16/2021   Primary hypertension 09/10/2020   GERD without esophagitis 05/02/2019   BMI 45.0-49.9, adult (HCC) 05/02/2019   Low serum vitamin B12 11/17/2016          Past Medical History:  Diagnosis Date   Anemia     Arthritis      neck, right foot, left shoulder   Balance disorder      06-27-2022  per pt at times "feels off balance without dizziness" , stated has had neurology/ cardiology/ and ENT work-up without etiology   Endometrial polyp     GERD (gastroesophageal  reflux disease)     Hypertension     MVP (mitral valve prolapse)      no evidence per echo in epic 10-21-2020, has trivial mitral valve regurg   OSA on CPAP 10/2021    followed by neurologist--- dr Frances Furbish;  sleep study in epic 10-26-2021 severe osa ( 06-27-2022 per pt uses apap nightly)   PMB (postmenopausal bleeding)     Pre-diabetes     PVC's (premature ventricular contractions)      followed by cardiologist--- dr branch;  last event monitor 11-11-2020  frequent PVCs (6%), NSR w/ rare SVT;   low risk nuclear study 10-21-2020 w/ no ischemia   Vitamin B 12 deficiency     Vitamin D deficiency 2022   Wears glasses             Past Surgical History:  Procedure Laterality Date   NO PAST SURGERIES                Current Outpatient Medications  Medication Sig Dispense Refill   acetaminophen (TYLENOL) 650 MG CR tablet Take 1,300 mg by mouth daily.       amLODipine (NORVASC) 2.5 MG tablet Take 1 tablet (2.5 mg total) by mouth daily. (Patient taking differently: Take 2.5  mg by mouth at bedtime.) 90 tablet 1   Carboxymethylcellul-Glycerin (CLEAR EYES FOR DRY EYES OP) Place 1-2 drops into both eyes daily as needed (dry eyes).       Cholecalciferol (VITAMIN D3) 50 MCG (2000 UT) TABS Take 1 tablet by mouth daily after lunch.       clindamycin (CLEOCIN-T) 1 % external solution Apply topically 2 (two) times daily. (Patient taking differently: Apply topically 2 (two) times daily as needed.) 30 mL 1   escitalopram (LEXAPRO) 10 MG tablet Take 1 tablet (10 mg total) by mouth daily. (Patient taking differently: Take 10 mg by mouth at bedtime.) 90 tablet 1   ferrous sulfate 325 (65 FE) MG EC tablet Take 325 mg by mouth 2 (two) times daily.       fexofenadine (ALLEGRA) 180 MG tablet TAKE 1 TABLET BY MOUTH EVERY DAY (Patient taking differently: Take 180 mg by mouth daily after lunch.) 90 tablet 1   fluticasone (FLONASE) 50 MCG/ACT nasal spray SPRAY 2 SPRAYS INTO EACH NOSTRIL EVERY DAY (Patient taking differently: Place 2 sprays into both nostrils daily as needed for allergies.) 48 mL 1   hydrochlorothiazide (HYDRODIURIL) 25 MG tablet Take 1 tablet (25 mg total) by mouth daily. (Patient taking differently: Take 25 mg by mouth daily.) 90 tablet 1   KRILL OIL PO Take 350 mg by mouth daily.        medroxyPROGESTERone (PROVERA) 10 MG tablet Take 1 tablet (10 mg total) by mouth daily. (Patient taking differently: Take 10 mg by mouth daily after lunch.) 14 tablet 0   metoprolol tartrate (LOPRESSOR) 25 MG tablet TAKE 1/2 TABLET (12.5 MG TOTAL) BY MOUTH 2 (TWO) TIMES DAILY AS NEEDED (PALPITATIONS). (Patient taking differently: Take 12.5 mg by mouth daily. TAKE 1/2 TABLET (12.5 MG TOTAL) BY MOUTH DAILY and AS NEEDED (PALPITATIONS) up to twice daily) 90 tablet 1   omeprazole (PRILOSEC) 20 MG capsule Take 1 capsule (20 mg total) by mouth daily. 90 capsule 1   Probiotic Product (PROBIOTIC PO) Take by mouth daily.       rosuvastatin (CRESTOR) 10 MG tablet Take 1 tablet (10 mg total) by mouth  daily. (Patient taking differently: Take 10 mg by mouth daily.) 90 tablet 1  Current Facility-Administered Medications  Medication Dose Route Frequency Provider Last Rate Last Admin   cyanocobalamin ((VITAMIN B-12)) injection 1,000 mcg  1,000 mcg Intramuscular Q30 days Bennie Pierini, FNP   1,000 mcg at 06/02/22 2703      ALLERGIES: Eggs or egg-derived products, Fluarix [influenza virus vaccine], Other, Peanut-containing drug products, Penicillins, Blueberry flavor, Kiwi extract, Watermelon flavor, Amoxicillin, and Sulfa antibiotics        Family History  Problem Relation Age of Onset   Hypertension Mother     Diabetes Mother     Other Mother          renal disease   Hypertension Father     Heart attack Father     Hypertension Sister     Diabetes Brother     Diabetes Brother     Sleep apnea Neg Hx        Social History         Socioeconomic History   Marital status: Single      Spouse name: Not on file   Number of children: Not on file   Years of education: Not on file   Highest education level: Not on file  Occupational History   Not on file  Tobacco Use   Smoking status: Never   Smokeless tobacco: Never  Vaping Use   Vaping Use: Never used  Substance and Sexual Activity   Alcohol use: No      Alcohol/week: 0.0 standard drinks of alcohol   Drug use: Never   Sexual activity: Not Currently      Partners: Male      Birth control/protection: Abstinence  Other Topics Concern   Not on file  Social History Narrative   Not on file    Social Determinants of Health    Financial Resource Strain: Not on file  Food Insecurity: Not on file  Transportation Needs: Not on file  Physical Activity: Not on file  Stress: Not on file  Social Connections: Not on file  Intimate Partner Violence: Not on file      Review of Systems  See HPI.    PHYSICAL EXAMINATION:     BP 112/74   Pulse (!) 56   Ht 5' 5.25" (1.657 m)   Wt (!) 302 lb (137 kg)   LMP  06/04/2022   SpO2 97%   BMI 49.87 kg/m     General appearance: alert, cooperative and appears stated age Head: Normocephalic, without obvious abnormality, atraumatic Neck: no adenopathy, supple, symmetrical, trachea midline and thyroid normal to inspection and palpation Lungs: clear to auscultation bilaterally Heart: regular rate and rhythm Abdomen: soft, non-tender, no masses,  no organomegaly Extremities: extremities normal, atraumatic, no cyanosis or edema Skin: Skin color, texture, turgor normal. No rashes or lesions Lymph nodes: Cervical, supraclavicular, and axillary nodes normal. No abnormal inguinal nodes palpated Neurologic: Grossly normal   Pelvic: External genitalia:  no lesions              Urethra:  normal appearing urethra with no masses, tenderness or lesions              Bartholins and Skenes: normal                 Vagina: normal appearing vagina with normal color and discharge, no lesions              Cervix: no lesions                Bimanual Exam:  Uterus:  normal size, contour, position, consistency, mobility, non-tender              Adnexa: no mass, fullness, tenderness       Chaperone was present for exam:  Marchelle Folks, CMA   ASSESSMENT   Postmenopausal bleeding.   Endometrial polyp.  Proliferative endometrium.   PLAN   We reviewed proliferative endometrium and endometrial polyps.  We discussed hysteroscopy with Myosure polypectomy, dilation and curettage.  Risks, benefits, and alternatives reviewed. Risks include but are not limited to bleeding, infection, damage to surrounding organs including uterine perforation requiring laparoscopy, pulmonary edema, reaction to anesthesia, DVT, PE, death, need for further treatment including medical therapy or surgery     Surgical expectations and recovery discussed.    Patient wishes to proceed.   I told her I anticipate post op treatment may be Provera 10 mg daily versus placement of a Mirena IUD in conjunction  with endometrial biopsy every 3 months for a year to document regression of the endometrial proliferation.  Final plan to follow review of surgical pathology specimen.    An After Visit Summary was printed and given to the patient.   39 min  total time was spent for this patient encounter, including preparation, face-to-face counseling with the patient, coordination of care, and documentation of the encounter.

## 2022-07-11 NOTE — Anesthesia Preprocedure Evaluation (Addendum)
Anesthesia Evaluation  Patient identified by MRN, date of birth, ID band Patient awake    Reviewed: Allergy & Precautions, NPO status , Patient's Chart, lab work & pertinent test results  Airway Mallampati: I  TM Distance: >3 FB Neck ROM: Full    Dental  (+) Teeth Intact, Dental Advisory Given   Pulmonary sleep apnea and Continuous Positive Airway Pressure Ventilation ,    breath sounds clear to auscultation       Cardiovascular hypertension,  Rhythm:Regular Rate:Normal     Neuro/Psych PSYCHIATRIC DISORDERS Anxiety negative neurological ROS     GI/Hepatic Neg liver ROS, GERD  ,  Endo/Other  negative endocrine ROS  Renal/GU negative Renal ROS     Musculoskeletal  (+) Arthritis ,   Abdominal   Peds  Hematology negative hematology ROS (+)   Anesthesia Other Findings   Reproductive/Obstetrics                            Anesthesia Physical Anesthesia Plan  ASA: 3  Anesthesia Plan: General   Post-op Pain Management:    Induction: Intravenous  PONV Risk Score and Plan: Ondansetron, Dexamethasone, Midazolam and Scopolamine patch - Pre-op  Airway Management Planned: LMA  Additional Equipment: None  Intra-op Plan:   Post-operative Plan: Extubation in OR  Informed Consent: I have reviewed the patients History and Physical, chart, labs and discussed the procedure including the risks, benefits and alternatives for the proposed anesthesia with the patient or authorized representative who has indicated his/her understanding and acceptance.     Dental advisory given  Plan Discussed with: CRNA  Anesthesia Plan Comments:        Anesthesia Quick Evaluation

## 2022-07-11 NOTE — Anesthesia Postprocedure Evaluation (Signed)
Anesthesia Post Note  Patient: ALFHILD PARTCH  Procedure(s) Performed: DILATATION & CURETTAGE/HYSTEROSCOPY WITH MYOSURE RESECTION OF ENDOMETRIAL POLYPS (Vagina )     Patient location during evaluation: PACU Anesthesia Type: General Level of consciousness: awake and alert Pain management: pain level controlled Vital Signs Assessment: post-procedure vital signs reviewed and stable Respiratory status: spontaneous breathing, nonlabored ventilation, respiratory function stable and patient connected to nasal cannula oxygen Cardiovascular status: blood pressure returned to baseline and stable Postop Assessment: no apparent nausea or vomiting Anesthetic complications: no   No notable events documented.  Last Vitals:  Vitals:   07/11/22 0845 07/11/22 0910  BP: (!) 143/69 (!) 145/80  Pulse: 71 72  Resp: (!) 22 18  Temp: 36.8 C 36.7 C  SpO2: 94% 97%    Last Pain:  Vitals:   07/11/22 0910  TempSrc:   PainSc: 0-No pain                 Shelton Silvas

## 2022-07-11 NOTE — Telephone Encounter (Signed)
Patient had D&C done today, she asked does she needs to wear her compression stockings at home? Patient said you mentioned something about them, but she can't remember. Please advise

## 2022-07-11 NOTE — Transfer of Care (Signed)
Immediate Anesthesia Transfer of Care Note  Patient: Renee Thomas  Procedure(s) Performed: DILATATION & CURETTAGE/HYSTEROSCOPY WITH MYOSURE RESECTION OF ENDOMETRIAL POLYPS (Vagina )  Patient Location: PACU  Anesthesia Type:General  Level of Consciousness: drowsy and patient cooperative  Airway & Oxygen Therapy: Patient Spontanous Breathing and Patient connected to face mask oxygen  Post-op Assessment: Report given to RN and Post -op Vital signs reviewed and stable  Post vital signs: Reviewed and stable  Last Vitals:  Vitals Value Taken Time  BP 132/64 07/11/22 0815  Temp    Pulse 76 07/11/22 0816  Resp 21 07/11/22 0816  SpO2 97 % 07/11/22 0816  Vitals shown include unvalidated device data.  Last Pain:  Vitals:   07/11/22 0617  TempSrc: Oral  PainSc: 0-No pain      Patients Stated Pain Goal: 4 (07/11/22 0617)  Complications: No notable events documented.

## 2022-07-11 NOTE — Telephone Encounter (Signed)
If she is resting more than she is up and around in a 24 hour period, I recommend she wear the stockings.    She should take them off for a period of time to let air get to her skin, however.

## 2022-07-12 ENCOUNTER — Encounter (HOSPITAL_BASED_OUTPATIENT_CLINIC_OR_DEPARTMENT_OTHER): Payer: Self-pay | Admitting: Obstetrics and Gynecology

## 2022-07-12 ENCOUNTER — Telehealth: Payer: Self-pay | Admitting: Nurse Practitioner

## 2022-07-13 NOTE — Telephone Encounter (Signed)
Form not in Renee Thomas's office. Spoke with patient and she is going to have them re fax the paper work.

## 2022-07-13 NOTE — Telephone Encounter (Signed)
Please see if you can locate forms

## 2022-07-13 NOTE — Telephone Encounter (Signed)
Do we have this form? 

## 2022-07-14 LAB — SURGICAL PATHOLOGY

## 2022-07-25 NOTE — Progress Notes (Unsigned)
GYNECOLOGY  VISIT   HPI: 51 y.o.   Single  African American  female   G0P0000 with No LMP recorded. Patient is perimenopausal.   here for 2 weeks status post DILATATION & CURETTAGE/HYSTEROSCOPY WITH MYOSURE RESECTION OF ENDOMETRIAL POLYPS (Vagina ). Patient's sister is present for the visit today.  Final pathology 07/11/22 showed inactive endometrium and benign endometrial polyp.  No atypia or malignancy were seen.   Her pre-surgical endometrial biopsy showed proliferative endometrium on 05/11/22.   She has been treated with daily Provera preop to control postmenopausal bleeding.   Her [place] was 42.8 and estradiol 17 on 06/14/22.   Hgb 12.0 on 07/11/22.   She does have a little bit of spotting in the morning but not every morning.  She missed dose yesterday, and she has some bleeding today.  GYNECOLOGIC HISTORY: No LMP recorded. Patient is perimenopausal. Contraception:  Abstinence Menopausal hormone therapy:  none Last mammogram:  03-02-21 Neg/BiRads1. Pt. To scheduled Last pap smear:  03-23-22 Neg:Neg HR HPV, 09-26-19 Neg, 08-25-16 Neg:Neg HR HPV        OB History     Gravida  0   Para  0   Term  0   Preterm  0   AB  0   Living  0      SAB  0   IAB  0   Ectopic  0   Multiple  0   Live Births                 Patient Active Problem List   Diagnosis Date Noted   Neck pain 02/02/2022   GAD (generalized anxiety disorder) 12/16/2021   Primary hypertension 09/10/2020   GERD without esophagitis 05/02/2019   BMI 45.0-49.9, adult ([place]) 05/02/2019   Low serum vitamin B12 11/17/2016    Past Medical History:  Diagnosis Date   Anemia    Arthritis    neck, right foot, left shoulder   Balance disorder    06-27-2022  per pt at times "feels off balance without dizziness" , stated has had neurology/ cardiology/ and ENT work-up without etiology   Endometrial polyp    GERD (gastroesophageal reflux disease)    Hypertension    MVP (mitral valve prolapse)    no  evidence per echo in epic 10-21-2020, has trivial mitral valve regurg   OSA on CPAP 10/2021   followed by neurologist--- dr Rexene Alberts;  sleep study in epic 10-26-2021 severe osa ( 06-27-2022 per pt uses apap nightly)   PMB (postmenopausal bleeding)    Pre-diabetes    PVC's (premature ventricular contractions)    followed by cardiologist--- dr branch;  last event monitor 11-11-2020  frequent PVCs (6%), NSR w/ rare SVT;   low risk nuclear study 10-21-2020 w/ no ischemia   Vitamin B 12 deficiency    Vitamin D deficiency 2022   Wears glasses     Past Surgical History:  Procedure Laterality Date   DILATATION & CURETTAGE/HYSTEROSCOPY WITH MYOSURE N/A 07/11/2022   Procedure: DILATATION & CURETTAGE/HYSTEROSCOPY WITH MYOSURE RESECTION OF ENDOMETRIAL POLYPS;  Surgeon: Nunzio Cobbs, MD;  Location: [facility];  Service: Gynecology;  Laterality: N/A;   NO PAST SURGERIES      Current Outpatient Medications  Medication Sig Dispense Refill   acetaminophen (TYLENOL) 650 MG CR tablet Take 1,300 mg by mouth daily.     amLODipine (NORVASC) 2.5 MG tablet Take 1 tablet (2.5 mg total) by mouth daily. (Patient taking differently: Take 2.5  mg by mouth at bedtime.) 90 tablet 1   Carboxymethylcellul-Glycerin (CLEAR EYES FOR DRY EYES OP) Place 1-2 drops into both eyes daily as needed (dry eyes).     Cholecalciferol (VITAMIN D3) 50 MCG (2000 UT) TABS Take 1 tablet by mouth daily after lunch.     clindamycin (CLEOCIN-T) 1 % external solution Apply topically 2 (two) times daily. (Patient taking differently: Apply topically 2 (two) times daily as needed.) 30 mL 1   escitalopram (LEXAPRO) 10 MG tablet Take 1 tablet (10 mg total) by mouth daily. (Patient taking differently: Take 10 mg by mouth at bedtime.) 90 tablet 1   fexofenadine (ALLEGRA) 180 MG tablet TAKE 1 TABLET BY MOUTH EVERY DAY (Patient taking differently: Take 180 mg by mouth daily after lunch.) 90 tablet 1   fluticasone (FLONASE) 50  MCG/ACT nasal spray SPRAY 2 SPRAYS INTO EACH NOSTRIL EVERY DAY (Patient taking differently: Place 2 sprays into both nostrils daily as needed for allergies.) 48 mL 1   hydrochlorothiazide (HYDRODIURIL) 25 MG tablet Take 1 tablet (25 mg total) by mouth daily. (Patient taking differently: Take 25 mg by mouth daily.) 90 tablet 1   ibuprofen (ADVIL) 600 MG tablet Take 1 tablet (600 mg total) by mouth every 6 (six) hours as needed. 30 tablet 0   KRILL OIL PO Take 350 mg by mouth daily.      medroxyPROGESTERone (PROVERA) 10 MG tablet Take 1 tablet (10 mg total) by mouth daily. 30 tablet 2   metoprolol tartrate (LOPRESSOR) 25 MG tablet TAKE 1/2 TABLET (12.5 MG TOTAL) BY MOUTH 2 (TWO) TIMES DAILY AS NEEDED (PALPITATIONS). (Patient taking differently: Take 12.5 mg by mouth daily. TAKE 1/2 TABLET (12.5 MG TOTAL) BY MOUTH DAILY and AS NEEDED (PALPITATIONS) up to twice daily) 90 tablet 1   omeprazole (PRILOSEC) 20 MG capsule Take 1 capsule (20 mg total) by mouth daily. 90 capsule 1   Probiotic Product (PROBIOTIC PO) Take by mouth daily.     rosuvastatin (CRESTOR) 10 MG tablet Take 1 tablet (10 mg total) by mouth daily. (Patient taking differently: Take 10 mg by mouth daily.) 90 tablet 1   Current Facility-Administered Medications  Medication Dose Route Frequency Provider Last Rate Last Admin   cyanocobalamin ((VITAMIN B-12)) injection 1,000 mcg  1,000 mcg Intramuscular Q30 days Bennie Pierini, FNP   1,000 mcg at 07/07/22 0940     ALLERGIES: Eggs or egg-derived products, Fluarix [influenza virus vaccine], Other, Peanut-containing drug products, Penicillins, Apple, Blueberry flavor, Kiwi extract, Watermelon flavor, Amoxicillin, and Sulfa antibiotics  Family History  Problem Relation Age of Onset   Hypertension Mother    Diabetes Mother    Other Mother        renal disease   Hypertension Father    Heart attack Father    Hypertension Sister    Diabetes Brother    Diabetes Brother    Sleep apnea  Neg Hx     Social History   Socioeconomic History   Marital status: Single    Spouse name: Not on file   Number of children: Not on file   Years of education: Not on file   Highest education level: Not on file  Occupational History   Not on file  Tobacco Use   Smoking status: Never   Smokeless tobacco: Never  Vaping Use   Vaping Use: Never used  Substance and Sexual Activity   Alcohol use: No    Alcohol/week: 0.0 standard drinks of alcohol   Drug use: Never  Sexual activity: Not Currently    Partners: Male    Birth control/protection: Abstinence  Other Topics Concern   Not on file  Social History Narrative   Not on file   Social Determinants of Health   Financial Resource Strain: Not on file  Food Insecurity: Not on file  Transportation Needs: Not on file  Physical Activity: Not on file  Stress: Not on file  Social Connections: Not on file  Intimate Partner Violence: Not on file    Review of Systems  All other systems reviewed and are negative.   PHYSICAL EXAMINATION:    BP 124/82   Ht 5' 5.25" (1.657 m)   Wt (!) 302 lb (137 kg)   BMI 49.87 kg/m     General appearance: alert, cooperative and appears stated age   Pelvic: External genitalia:  no lesions              Urethra:  normal appearing urethra with no masses, tenderness or lesions              Bartholins and Skenes: normal                 Vagina: normal appearing vagina with normal color and discharge, no lesions              Cervix: no lesions.  Small amount of blood tinged mucous from the os.                 Bimanual Exam:  Uterus:  normal size, contour, position, consistency, mobility, non-tender              Adnexa: no mass, fullness, tenderness            Chaperone was present for exam: Marchelle Folks, CMA  ASSESSMENT  Status post hysteroscopy with dilation and curettage.  Proliferative endometrium.   PLAN  Surgical findings, procedure, and pathology report reviewed.  She discussed options  for treating proliferative endometrium:  daily Provera 10 or 20 mg and Mirena IUD.  We dicussed risks and benefits of each. She will continue Provera 10 mg daily. New Rx for #30, RF 3.  She will call me if her spotting persists, at which time she will increase to Provera 20 mg daily.  Return in 3 months for a follow up endometrial biopsy.  She will schedule her mammogram with [facility].  She was given their contract information.    An After Visit Summary was printed and given to the patient.

## 2022-07-26 ENCOUNTER — Ambulatory Visit (INDEPENDENT_AMBULATORY_CARE_PROVIDER_SITE_OTHER): Payer: 59 | Admitting: Obstetrics and Gynecology

## 2022-07-26 ENCOUNTER — Encounter: Payer: Self-pay | Admitting: Obstetrics and Gynecology

## 2022-07-26 VITALS — BP 124/82 | Ht 65.25 in | Wt 302.0 lb

## 2022-07-26 DIAGNOSIS — Z9889 Other specified postprocedural states: Secondary | ICD-10-CM

## 2022-07-26 MED ORDER — MEDROXYPROGESTERONE ACETATE 10 MG PO TABS: 10.0000 mg | ORAL_TABLET | Freq: Every day | ORAL | 3 refills | Status: DC

## 2022-07-28 ENCOUNTER — Ambulatory Visit: Payer: 59 | Attending: Cardiology | Admitting: Cardiology

## 2022-07-28 ENCOUNTER — Encounter: Payer: Self-pay | Admitting: Cardiology

## 2022-07-28 VITALS — BP 110/70 | HR 60 | Ht 66.0 in | Wt 296.8 lb

## 2022-07-28 DIAGNOSIS — I10 Essential (primary) hypertension: Secondary | ICD-10-CM | POA: Diagnosis not present

## 2022-07-28 DIAGNOSIS — I493 Ventricular premature depolarization: Secondary | ICD-10-CM

## 2022-07-28 DIAGNOSIS — R42 Dizziness and giddiness: Secondary | ICD-10-CM

## 2022-07-28 NOTE — Progress Notes (Signed)
Clinical Summary Renee Thomas is a 51 y.o.femaleseen today for follow up of the following medical problems.      1. Dizziness - long history of symptoms  can come on with reading or watching tv, turning her head certain ways. Bad episodes can last several hours.    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 Was standing at sink. Felt lightheaded/dizzy like may pass out. Sitting felt better. 2nd that day sitting watching tv, similar episode. Shorter in duration. No other associated symptoms - similar to her prior episodes, but this one did not have headache - had some improvement with vestibular rehab    - ongoing feeling of being off balance with walking - evaluated by neuro, ENT   2. PVCs - occasional palpitaitons, mainly with laying down to go to sleep. Fairly mild. Can occur with stress - no coffee, occasional tea, 1 soda a nigh MTN Dew, no EtoH - normal K, Mg, normal TSH - over the last month can have some postional back and chest pain. Heaviest activity is moving heavy boxes at work with some mild fatigue.  - previous monitor in 2003/2004.      10/2020 nuclear stress no ischemia 10/2020 echo :LVEF 55-60% Jan 2022 14 day monitor: 6.3% PVCs, rare short runs of SVT   - occasional isolated palpitations - compliant with metoprolol     3. Chest pressure/DOE - 10/2020 nuclear stress no ischemia 10/2020 echo :LVEF 55-60%   -no recent significant symptoms     4. HTN - compliant with meds    5. Hyperlipidemia - 03/2022 TC 131 TG 63 HDL 51 LDL 67 - she is compliant with atorvastatin  Past Medical History:  Diagnosis Date   Anemia    Arthritis    neck, right foot, left shoulder   Balance disorder    06-27-2022  per pt at times "feels off balance without  dizziness" , stated has had neurology/ cardiology/ and ENT work-up without etiology   Endometrial polyp    GERD (gastroesophageal reflux disease)    Hypertension    MVP (mitral valve prolapse)    no evidence per echo in epic 10-21-2020, has trivial mitral valve regurg   OSA on CPAP 10/2021   followed by neurologist--- dr Rexene Alberts;  sleep study in epic 10-26-2021 severe osa ( 06-27-2022 per pt uses apap nightly)   PMB (postmenopausal bleeding)    Pre-diabetes    PVC's (premature ventricular contractions)    followed by cardiologist--- dr Artelia Game;  last event monitor 11-11-2020  frequent PVCs (6%), NSR w/ rare SVT;   low risk nuclear study 10-21-2020 w/ no ischemia   Vitamin B 12 deficiency    Vitamin D deficiency 2022   Wears glasses      Allergies  Allergen Reactions   Eggs Or Egg-Derived Products Swelling    Swells mouth with itching   Fluarix [Influenza Virus Vaccine] Swelling    Contains egg protein.   Other Anaphylaxis    All Tree-Nuts   Peanut-Containing Drug Products Anaphylaxis   Penicillins Rash and Anaphylaxis    .Has patient had a PCN reaction causing immediate rash, facial/tongue/throat swelling, SOB or lightheadedness with hypotension: Yes Has patient had a PCN reaction causing severe rash involving  mucus membranes or skin necrosis: No Has patient had a PCN reaction that required hospitalization: no Has patient had a PCN reaction occurring within the last 10 years: no If all of the above answers are "NO", then may proceed with Cephalosporin use.  Other reaction(s): Other (See Comments) .Has patient had a PCN reaction causing immediate rash, facial/tongue/throat swelling, SOB or lightheadedness with hypotension: Yes Has patient had a PCN reaction causing severe rash involving mucus membranes or skin necrosis: No Has patient had a PCN reaction that required hospitalization: no Has patient had a PCN reaction occurring within the last 10 years: no If all of the above  answers are "NO", then may proceed with Cephalosporin use.   Apple Itching    Mouth itching   Blueberry Flavor Itching   Kiwi Extract Itching    Per pt tongue and throat itching/ burning   Watermelon Flavor Itching    And Canteloupe   Amoxicillin Rash   Sulfa Antibiotics Rash    numbness     Current Outpatient Medications  Medication Sig Dispense Refill   acetaminophen (TYLENOL) 650 MG CR tablet Take 1,300 mg by mouth daily.     amLODipine (NORVASC) 2.5 MG tablet Take 1 tablet (2.5 mg total) by mouth daily. (Patient taking differently: Take 2.5 mg by mouth at bedtime.) 90 tablet 1   Carboxymethylcellul-Glycerin (CLEAR EYES FOR DRY EYES OP) Place 1-2 drops into both eyes daily as needed (dry eyes).     Cholecalciferol (VITAMIN D3) 50 MCG (2000 UT) TABS Take 1 tablet by mouth daily after lunch.     clindamycin (CLEOCIN-T) 1 % external solution Apply topically 2 (two) times daily. (Patient taking differently: Apply topically 2 (two) times daily as needed.) 30 mL 1   escitalopram (LEXAPRO) 10 MG tablet Take 1 tablet (10 mg total) by mouth daily. (Patient taking differently: Take 10 mg by mouth at bedtime.) 90 tablet 1   fexofenadine (ALLEGRA) 180 MG tablet TAKE 1 TABLET BY MOUTH EVERY DAY (Patient taking differently: Take 180 mg by mouth daily after lunch.) 90 tablet 1   fluticasone (FLONASE) 50 MCG/ACT nasal spray SPRAY 2 SPRAYS INTO EACH NOSTRIL EVERY DAY (Patient taking differently: Place 2 sprays into both nostrils daily as needed for allergies.) 48 mL 1   hydrochlorothiazide (HYDRODIURIL) 25 MG tablet Take 1 tablet (25 mg total) by mouth daily. (Patient taking differently: Take 25 mg by mouth daily.) 90 tablet 1   ibuprofen (ADVIL) 600 MG tablet Take 1 tablet (600 mg total) by mouth every 6 (six) hours as needed. 30 tablet 0   KRILL OIL PO Take 350 mg by mouth daily.      medroxyPROGESTERone (PROVERA) 10 MG tablet Take 1 tablet (10 mg total) by mouth daily. 30 tablet 3   metoprolol  tartrate (LOPRESSOR) 25 MG tablet TAKE 1/2 TABLET (12.5 MG TOTAL) BY MOUTH 2 (TWO) TIMES DAILY AS NEEDED (PALPITATIONS). (Patient taking differently: Take 12.5 mg by mouth daily. TAKE 1/2 TABLET (12.5 MG TOTAL) BY MOUTH DAILY and AS NEEDED (PALPITATIONS) up to twice daily) 90 tablet 1   omeprazole (PRILOSEC) 20 MG capsule Take 1 capsule (20 mg total) by mouth daily. 90 capsule 1   Probiotic Product (PROBIOTIC PO) Take by mouth daily.     rosuvastatin (CRESTOR) 10 MG tablet Take 1 tablet (10 mg total) by mouth daily. (Patient taking differently: Take 10 mg by mouth daily.) 90 tablet 1   Current Facility-Administered Medications  Medication Dose Route Frequency Provider Last Rate Last  Admin   cyanocobalamin ((VITAMIN B-12)) injection 1,000 mcg  1,000 mcg Intramuscular Q30 days Chevis Pretty, FNP   1,000 mcg at 07/07/22 0940     Past Surgical History:  Procedure Laterality Date   DILATATION & CURETTAGE/HYSTEROSCOPY WITH MYOSURE N/A 07/11/2022   Procedure: DILATATION & CURETTAGE/HYSTEROSCOPY WITH MYOSURE RESECTION OF ENDOMETRIAL POLYPS;  Surgeon: Nunzio Cobbs, MD;  Location: [facility];  Service: Gynecology;  Laterality: N/A;   NO PAST SURGERIES       Allergies  Allergen Reactions   Eggs Or Egg-Derived Products Swelling    Swells mouth with itching   Fluarix [Influenza Virus Vaccine] Swelling    Contains egg protein.   Other Anaphylaxis    All Tree-Nuts   Peanut-Containing Drug Products Anaphylaxis   Penicillins Rash and Anaphylaxis    .Has patient had a PCN reaction causing immediate rash, facial/tongue/throat swelling, SOB or lightheadedness with hypotension: Yes Has patient had a PCN reaction causing severe rash involving mucus membranes or skin necrosis: No Has patient had a PCN reaction that required hospitalization: no Has patient had a PCN reaction occurring within the last 10 years: no If all of the above answers are "NO", then may proceed with  Cephalosporin use.  Other reaction(s): Other (See Comments) .Has patient had a PCN reaction causing immediate rash, facial/tongue/throat swelling, SOB or lightheadedness with hypotension: Yes Has patient had a PCN reaction causing severe rash involving mucus membranes or skin necrosis: No Has patient had a PCN reaction that required hospitalization: no Has patient had a PCN reaction occurring within the last 10 years: no If all of the above answers are "NO", then may proceed with Cephalosporin use.   Apple Itching    Mouth itching   Blueberry Flavor Itching   Kiwi Extract Itching    Per pt tongue and throat itching/ burning   Watermelon Flavor Itching    And Canteloupe   Amoxicillin Rash   Sulfa Antibiotics Rash    numbness      Family History  Problem Relation Age of Onset   Hypertension Mother    Diabetes Mother    Other Mother        renal disease   Hypertension Father    Heart attack Father    Hypertension Sister    Diabetes Brother    Diabetes Brother    Sleep apnea Neg Hx      Social History Renee Thomas reports that she has never smoked. She has never used smokeless tobacco. Renee Thomas reports no history of alcohol use.   Review of Systems CONSTITUTIONAL: No weight loss, fever, chills, weakness or fatigue.  HEENT: Eyes: No visual loss, blurred vision, double vision or yellow sclerae.No hearing loss, sneezing, congestion, runny nose or sore throat.  SKIN: No rash or itching.  CARDIOVASCULAR: per hpi RESPIRATORY: No shortness of breath, cough or sputum.  GASTROINTESTINAL: No anorexia, nausea, vomiting or diarrhea. No abdominal pain or blood.  GENITOURINARY: No burning on urination, no polyuria NEUROLOGICAL: No headache, dizziness, syncope, paralysis, ataxia, numbness or tingling in the extremities. No change in bowel or bladder control.  MUSCULOSKELETAL: No muscle, back pain, joint pain or stiffness.  LYMPHATICS: No enlarged nodes. No history of splenectomy.   PSYCHIATRIC: No history of depression or anxiety.  ENDOCRINOLOGIC: No reports of sweating, cold or heat intolerance. No polyuria or polydipsia.  Marland Kitchen   Physical Examination Today's Vitals   07/28/22 1253  BP: 110/70  Pulse: 60  SpO2: 97%  Weight: 296  lb 12.8 oz (134.6 kg)  Height: 5\' 6"  (1.676 m)   Body mass index is 47.9 kg/m.  Gen: resting comfortably, no acute distress HEENT: no scleral icterus, pupils equal round and reactive, no palptable cervical adenopathy,  CV: RRR, no m/r/g, no jvd Resp: Clear to auscultation bilaterally GI: abdomen is soft, non-tender, non-distended, normal bowel sounds, no hepatosplenomegaly MSK: extremities are warm, no edema.  Skin: warm, no rash Neuro:  no focal deficits Psych: appropriate affect   Diagnostic Studies  08/2017 holter Sinus rhythm  Rates 53 to 114 bpm  Average HR 76 bpm  Frequent PVCs  (13% total)  12 couplets, 5 triplets       10/2020 echo 1. Left ventricular ejection fraction, by estimation, is 55 to 60%. The  left ventricle has normal function. The left ventricle has no regional  wall motion abnormalities. There is mild left ventricular hypertrophy.  Left ventricular diastolic parameters  are indeterminate.   2. Right ventricular systolic function is normal. The right ventricular  size is normal. Tricuspid regurgitation signal is inadequate for assessing  PA pressure.   3. The mitral valve is grossly normal. Trivial mitral valve  regurgitation.   4. The aortic valve is tricuspid. Aortic valve regurgitation is not  visualized.   5. The inferior vena cava is normal in size with greater than 50%  respiratory variability, suggesting right atrial pressure of 3 mmHg.      Jan 2022 14 day monitor Predominant rhythm is normal sinus rhythm Rare supraventricular ectopy Frequent ventricular ectopy primarily as isolated PVCs (6.3%). Symptoms correlated with sinus rhythm, PVCs.   14 day monitor. Patient had a min HR of 50  bpm, max HR of 197 bpm, and avg HR of 73 bpm. Predominant underlying rhythm was Sinus Rhythm. 2 Supraventricular Tachycardia runs occurred, the run with the fastest interval lasting 5 beats with a max rate of 197 bpm, the longest lasting 6 beats with an avg rate of 103 bpm. Isolated SVEs were rare (<1.0%), and no SVE Couplets or SVE Triplets were present. Isolated VEs were frequent (6.3%, 92933), VE Couplets were rare (<1.0%, 3), and VE Triplets were rare (<1.0%, 1). Ventricular Bigeminy and Trigeminy were present.     10/2020 nuclear stress No diagnostic ST segment changes. Frequent PVCs were noted. Small, mild intensity, mid apical anteroseptal defect that is partially reversible and consistent with variable soft tissue attenuation. Summed stress score is only 1. This is a low risk study based on perfusion imaging. Nuclear stress EF: 19%. Calculation appears to be inaccurate, possibly affected by frequent PVCs. Suggest echocardiogram to correlate.   Assessment and Plan  1. PVCs - doing well, continue lopressor   2. HTN - at goal, continue current meds   3. Dizziness -history and prior testing not consistent with cardiac etiology -defer to neuro any additional workup  4.Hyperlipidemia - at goal, continue current meds   F/u 1 year      Arnoldo Lenis, M.D.

## 2022-07-28 NOTE — Patient Instructions (Signed)

## 2022-08-07 ENCOUNTER — Ambulatory Visit (INDEPENDENT_AMBULATORY_CARE_PROVIDER_SITE_OTHER): Payer: 59

## 2022-08-07 DIAGNOSIS — E538 Deficiency of other specified B group vitamins: Secondary | ICD-10-CM

## 2022-08-07 NOTE — Progress Notes (Signed)
B12 injection given to patient and tolerated well.  

## 2022-08-12 DIAGNOSIS — Z1231 Encounter for screening mammogram for malignant neoplasm of breast: Secondary | ICD-10-CM | POA: Diagnosis not present

## 2022-08-16 ENCOUNTER — Encounter: Payer: Self-pay | Admitting: Obstetrics and Gynecology

## 2022-08-16 ENCOUNTER — Ambulatory Visit (INDEPENDENT_AMBULATORY_CARE_PROVIDER_SITE_OTHER): Payer: 59

## 2022-08-16 DIAGNOSIS — Z23 Encounter for immunization: Secondary | ICD-10-CM

## 2022-09-03 ENCOUNTER — Other Ambulatory Visit: Payer: Self-pay | Admitting: Nurse Practitioner

## 2022-09-03 DIAGNOSIS — I10 Essential (primary) hypertension: Secondary | ICD-10-CM

## 2022-09-08 ENCOUNTER — Ambulatory Visit: Payer: 59

## 2022-09-11 ENCOUNTER — Ambulatory Visit (INDEPENDENT_AMBULATORY_CARE_PROVIDER_SITE_OTHER): Payer: 59 | Admitting: Nurse Practitioner

## 2022-09-11 ENCOUNTER — Encounter: Payer: Self-pay | Admitting: Nurse Practitioner

## 2022-09-11 VITALS — BP 114/67 | HR 61 | Temp 97.6°F | Resp 20 | Ht 66.0 in | Wt 294.0 lb

## 2022-09-11 DIAGNOSIS — E538 Deficiency of other specified B group vitamins: Secondary | ICD-10-CM

## 2022-09-11 DIAGNOSIS — K219 Gastro-esophageal reflux disease without esophagitis: Secondary | ICD-10-CM | POA: Diagnosis not present

## 2022-09-11 DIAGNOSIS — E7800 Pure hypercholesterolemia, unspecified: Secondary | ICD-10-CM | POA: Diagnosis not present

## 2022-09-11 DIAGNOSIS — F411 Generalized anxiety disorder: Secondary | ICD-10-CM | POA: Diagnosis not present

## 2022-09-11 DIAGNOSIS — I10 Essential (primary) hypertension: Secondary | ICD-10-CM

## 2022-09-11 DIAGNOSIS — Z6841 Body Mass Index (BMI) 40.0 and over, adult: Secondary | ICD-10-CM

## 2022-09-11 DIAGNOSIS — R42 Dizziness and giddiness: Secondary | ICD-10-CM

## 2022-09-11 MED ORDER — HYDROCHLOROTHIAZIDE 25 MG PO TABS: 25.0000 mg | ORAL_TABLET | Freq: Every day | ORAL | 1 refills | Status: DC

## 2022-09-11 MED ORDER — ESCITALOPRAM OXALATE 10 MG PO TABS: 10.0000 mg | ORAL_TABLET | Freq: Every day | ORAL | 1 refills | Status: DC

## 2022-09-11 MED ORDER — AMLODIPINE BESYLATE 2.5 MG PO TABS: 2.5000 mg | ORAL_TABLET | Freq: Every day | ORAL | 1 refills | Status: DC

## 2022-09-11 MED ORDER — METOPROLOL TARTRATE 25 MG PO TABS: ORAL_TABLET | ORAL | 1 refills | Status: DC

## 2022-09-11 MED ORDER — ROSUVASTATIN CALCIUM 10 MG PO TABS: 10.0000 mg | ORAL_TABLET | Freq: Every day | ORAL | 1 refills | Status: DC

## 2022-09-11 MED ORDER — OMEPRAZOLE 20 MG PO CPDR: 20.0000 mg | DELAYED_RELEASE_CAPSULE | Freq: Every day | ORAL | 1 refills | Status: DC

## 2022-09-11 NOTE — Progress Notes (Addendum)
Subjective:    Patient ID: Renee Thomas, female    DOB: 15-Jul-1971, 51 y.o.   MRN: [id number]  Chief Complaint: medical management of chronic issues     HPI:  Renee Thomas is a 51 y.o. who identifies as a female who was assigned female at birth.   Social history: Lives with: sister Work history: disability   Comes in today for follow up of the following chronic medical issues:  1. Primary hypertension No c/o chest pain, sob or headache. Doe snot check blood pressure at home. BP Readings from Last 3 Encounters:  07/28/22 110/70  07/26/22 124/82  07/11/22 (!) 145/80     2. Hyperlipidemia Does not watch diet and does no dedicated exercise. Lab Results  Component Value Date   CHOL 131 03/10/2022   HDL 51 03/10/2022   LDLCALC 67 03/10/2022   TRIG 63 03/10/2022   CHOLHDL 2.6 03/10/2022     3. GERD without esophagitis Is on omeprazole daily and is doing well.  4. GAD (generalized anxiety disorder) Is on no prescription meds for this.    09/11/2022    8:34 AM 03/15/2022    9:18 AM 03/10/2022    9:24 AM 12/16/2021    8:14 AM  GAD 7 : Generalized Anxiety Score  Nervous, Anxious, on Edge _0 Control/stop worrying _1 Worry too much - different things 0 _2 Trouble relaxing 1 0 0 1  Restless 0 1 0 1  Easily annoyed or irritable 0 0 1 1  Afraid - awful might happen 0 0 1 2  Total GAD 7 Score _3 Anxiety Difficulty Not difficult at all Somewhat difficult Not difficult at all Somewhat difficult      5. Low serum vitamin B12 Is on monthly b12 injections  6. BMI 45.0-49.9, adult (HCC) No recent weight changes Wt Readings from Last 3 Encounters:  09/11/22 294 lb (133.4 kg)  07/28/22 296 lb 12.8 oz (134.6 kg)  07/26/22 (!) 302 lb (137 kg)   BMI Readings from Last 3 Encounters:  09/11/22 47.45 kg/m  07/28/22 47.90 kg/m  07/26/22 49.87 kg/m     New complaints: Chronic vertigo is no better. Flares up daily and last hours at a time.she has  seen multiple specialist with no answers for the cause.   Allergies  Allergen Reactions   Eggs Or Egg-Derived Products Swelling    Swells mouth with itching   Fluarix [Influenza Virus Vaccine] Swelling    Contains egg protein.   Other Anaphylaxis    All Tree-Nuts   Peanut-Containing Drug Products Anaphylaxis   Penicillins Rash and Anaphylaxis    .Has patient had a PCN reaction causing immediate rash, facial/tongue/throat swelling, SOB or lightheadedness with hypotension: Yes Has patient had a PCN reaction causing severe rash involving mucus membranes or skin necrosis: No Has patient had a PCN reaction that required hospitalization: no Has patient had a PCN reaction occurring within the last 10 years: no If all of the above answers are "NO", then may proceed with Cephalosporin use.  Other reaction(s): Other (See Comments) .Has patient had a PCN reaction causing immediate rash, facial/tongue/throat swelling, SOB or lightheadedness with hypotension: Yes Has patient had a PCN reaction causing severe rash involving mucus membranes or skin necrosis: No Has patient had a PCN reaction that required hospitalization: no Has patient had a PCN reaction occurring within the last 10 years: no If all of the  above answers are "NO", then may proceed with Cephalosporin use.   Apple Itching    Mouth itching   Blueberry Flavor Itching   Kiwi Extract Itching    Per pt tongue and throat itching/ burning   Watermelon Flavor Itching    And Canteloupe   Amoxicillin Rash   Sulfa Antibiotics Rash    numbness   Outpatient Encounter Medications as of 09/11/2022  Medication Sig   acetaminophen (TYLENOL) 650 MG CR tablet Take 1,300 mg by mouth daily.   amLODipine (NORVASC) 2.5 MG tablet TAKE 1 TABLET BY MOUTH EVERY DAY   Carboxymethylcellul-Glycerin (CLEAR EYES FOR DRY EYES OP) Place 1-2 drops into both eyes daily as needed (dry eyes).   Cholecalciferol (VITAMIN D3) 50 MCG (2000 UT) TABS Take 1 tablet by  mouth daily after lunch.   clindamycin (CLEOCIN-T) 1 % external solution Apply topically 2 (two) times daily. (Patient taking differently: Apply topically 2 (two) times daily as needed.)   escitalopram (LEXAPRO) 10 MG tablet Take 1 tablet (10 mg total) by mouth daily. (Patient taking differently: Take 10 mg by mouth at bedtime.)   fexofenadine (ALLEGRA) 180 MG tablet TAKE 1 TABLET BY MOUTH EVERY DAY (Patient taking differently: Take 180 mg by mouth daily after lunch.)   fluticasone (FLONASE) 50 MCG/ACT nasal spray SPRAY 2 SPRAYS INTO EACH NOSTRIL EVERY DAY (Patient taking differently: Place 2 sprays into both nostrils daily as needed for allergies.)   hydrochlorothiazide (HYDRODIURIL) 25 MG tablet Take 1 tablet (25 mg total) by mouth daily. (Patient taking differently: Take 25 mg by mouth daily.)   ibuprofen (ADVIL) 600 MG tablet Take 1 tablet (600 mg total) by mouth every 6 (six) hours as needed. (Patient not taking: Reported on 07/28/2022)   KRILL OIL PO Take 350 mg by mouth daily.    medroxyPROGESTERone (PROVERA) 10 MG tablet Take 1 tablet (10 mg total) by mouth daily.   metoprolol tartrate (LOPRESSOR) 25 MG tablet TAKE 1/2 TABLET (12.5 MG TOTAL) BY MOUTH 2 (TWO) TIMES DAILY AS NEEDED (PALPITATIONS). (Patient taking differently: Take 12.5 mg by mouth daily. TAKE 1/2 TABLET (12.5 MG TOTAL) BY MOUTH DAILY and AS NEEDED (PALPITATIONS) up to twice daily)   omeprazole (PRILOSEC) 20 MG capsule Take 1 capsule (20 mg total) by mouth daily.   Probiotic Product (PROBIOTIC PO) Take by mouth daily.   rosuvastatin (CRESTOR) 10 MG tablet Take 1 tablet (10 mg total) by mouth daily. (Patient taking differently: Take 10 mg by mouth daily.)   Facility-Administered Encounter Medications as of 09/11/2022  Medication   cyanocobalamin ((VITAMIN B-12)) injection 1,000 mcg    Past Surgical History:  Procedure Laterality Date   DILATATION & CURETTAGE/HYSTEROSCOPY WITH MYOSURE N/A 07/11/2022   Procedure: DILATATION &  CURETTAGE/HYSTEROSCOPY WITH MYOSURE RESECTION OF ENDOMETRIAL POLYPS;  Surgeon: Nunzio Cobbs, MD;  Location: [facility];  Service: Gynecology;  Laterality: N/A;   NO PAST SURGERIES      Family History  Problem Relation Age of Onset   Hypertension Mother    Diabetes Mother    Other Mother        renal disease   Hypertension Father    Heart attack Father    Hypertension Sister    Diabetes Brother    Diabetes Brother    Sleep apnea Neg Hx       Controlled substance contract: n/a     Review of Systems  Constitutional:  Negative for diaphoresis.  Eyes:  Negative for pain.  Respiratory:  Negative for shortness of breath.   Cardiovascular:  Negative for chest pain, palpitations and leg swelling.  Gastrointestinal:  Negative for abdominal pain.  Endocrine: Negative for polydipsia.  Skin:  Negative for rash.  Neurological:  Negative for dizziness, weakness and headaches.  Hematological:  Does not bruise/bleed easily.  All other systems reviewed and are negative.      Objective:   Physical Exam Vitals and nursing note reviewed.  Constitutional:      General: She is not in acute distress.    Appearance: Normal appearance. She is well-developed.  HENT:     Head: Normocephalic.     Right Ear: Tympanic membrane normal.     Left Ear: Tympanic membrane normal.     Nose: Nose normal.     Mouth/Throat:     Mouth: Mucous membranes are moist.  Eyes:     Pupils: Pupils are equal, round, and reactive to light.  Neck:     Vascular: No carotid bruit or JVD.  Cardiovascular:     Rate and Rhythm: Normal rate and regular rhythm.     Heart sounds: Normal heart sounds.  Pulmonary:     Effort: Pulmonary effort is normal. No respiratory distress.     Breath sounds: Normal breath sounds. No wheezing or rales.  Chest:     Chest wall: No tenderness.  Abdominal:     General: Bowel sounds are normal. There is no distension or abdominal bruit.     Palpations:  Abdomen is soft. There is no hepatomegaly, splenomegaly, mass or pulsatile mass.     Tenderness: There is no abdominal tenderness.  Musculoskeletal:        General: Normal range of motion.     Cervical back: Normal range of motion and neck supple.  Lymphadenopathy:     Cervical: No cervical adenopathy.  Skin:    General: Skin is warm and dry.  Neurological:     Mental Status: She is alert and oriented to person, place, and time.     Deep Tendon Reflexes: Reflexes are normal and symmetric.  Psychiatric:        Behavior: Behavior normal.        Thought Content: Thought content normal.        Judgment: Judgment normal.    BP 114/67   Pulse 61   Temp 97.6 F (36.4 C) (Temporal)   Resp 20   Ht _0  (1.676 m)   Wt 294 lb (133.4 kg)   SpO2 99%   BMI 47.45 kg/m         Assessment & Plan:   KRISTOL ALMANZAR comes in today with chief complaint of Medical Management of Chronic Issues (Wants ears checked)   Diagnosis and orders addressed:  1. Primary hypertension Low sodium diet - amLODipine (NORVASC) 2.5 MG tablet; Take 1 tablet (2.5 mg total) by mouth daily.  Dispense: 90 tablet; Refill: 1 - hydrochlorothiazide (HYDRODIURIL) 25 MG tablet; Take 1 tablet (25 mg total) by mouth daily.  Dispense: 90 tablet; Refill: 1 - metoprolol tartrate (LOPRESSOR) 25 MG tablet; TAKE 1/2 TABLET (12.5 MG TOTAL) BY MOUTH 2 (TWO) TIMES DAILY AS NEEDED (PALPITATIONS).  Dispense: 90 tablet; Refill: 1 - CBC with Differential/Platelet - CMP14+EGFR  2. GERD without esophagitis Avoid spicy foods Do not eat 2 hours prior to bedtime - omeprazole (PRILOSEC) 20 MG capsule; Take 1 capsule (20 mg total) by mouth daily.  Dispense: 90 capsule; Refill: 1  3. GAD (generalized anxiety disorder) Stress management - escitalopram (  LEXAPRO) 10 MG tablet; Take 1 tablet (10 mg total) by mouth daily.  Dispense: 90 tablet; Refill: 1  4. Low serum vitamin B12 Continue B12 injections  5. BMI 45.0-49.9, adult  ([place]) Discussed diet and exercise for person with BMI >25 Will recheck weight in 3-6 months    6. Pure hypercholesterolemia Low fat diet - rosuvastatin (CRESTOR) 10 MG tablet; Take 1 tablet (10 mg total) by mouth daily.  Dispense: 90 tablet; Refill: 1 - Lipid panel  7. Chronic vertigo Continue to see specialist as needed Take meclizine as needed  Labs pending Health Maintenance reviewed Diet and exercise encouraged  Follow up plan: 6 months   [place], FNP

## 2022-09-11 NOTE — Addendum Note (Signed)
Addended by: Chevis Pretty on: 09/11/2022 08:57 AM   Modules accepted: Level of Service

## 2022-09-12 LAB — CBC WITH DIFFERENTIAL/PLATELET
Basophils Absolute: 0 10*3/uL (ref 0.0–0.2)
Basos: 1 %
EOS (ABSOLUTE): 0.1 10*3/uL (ref 0.0–0.4)
Eos: 2 %
Hematocrit: 39.9 % (ref 34.0–46.6)
Hemoglobin: 12.6 g/dL (ref 11.1–15.9)
Immature Grans (Abs): 0 10*3/uL (ref 0.0–0.1)
Immature Granulocytes: 0 %
Lymphocytes Absolute: 1.9 10*3/uL (ref 0.7–3.1)
Lymphs: 37 %
MCH: 27.1 pg (ref 26.6–33.0)
MCHC: 31.6 g/dL (ref 31.5–35.7)
MCV: 86 fL (ref 79–97)
Monocytes Absolute: 0.4 10*3/uL (ref 0.1–0.9)
Monocytes: 7 %
Neutrophils Absolute: 2.7 10*3/uL (ref 1.4–7.0)
Neutrophils: 53 %
Platelets: 381 10*3/uL (ref 150–450)
RBC: 4.65 x10E6/uL (ref 3.77–5.28)
RDW: 14.2 % (ref 11.7–15.4)
WBC: 5.1 10*3/uL (ref 3.4–10.8)

## 2022-09-12 LAB — CMP14+EGFR
ALT: 21 IU/L (ref 0–32)
AST: 14 IU/L (ref 0–40)
Albumin/Globulin Ratio: 1.3 (ref 1.2–2.2)
Albumin: 4.4 g/dL (ref 3.8–4.9)
Alkaline Phosphatase: 126 IU/L — ABNORMAL HIGH (ref 44–121)
BUN/Creatinine Ratio: 18 (ref 9–23)
BUN: 16 mg/dL (ref 6–24)
Bilirubin Total: 0.4 mg/dL (ref 0.0–1.2)
CO2: 20 mmol/L (ref 20–29)
Calcium: 10 mg/dL (ref 8.7–10.2)
Chloride: 106 mmol/L (ref 96–106)
Creatinine, Ser: 0.87 mg/dL (ref 0.57–1.00)
Globulin, Total: 3.3 g/dL (ref 1.5–4.5)
Glucose: 86 mg/dL (ref 70–99)
Potassium: 4 mmol/L (ref 3.5–5.2)
Sodium: 142 mmol/L (ref 134–144)
Total Protein: 7.7 g/dL (ref 6.0–8.5)
eGFR: 80 mL/min/{1.73_m2} (ref >59)

## 2022-09-12 LAB — LIPID PANEL
Chol/HDL Ratio: 2.7 ratio (ref 0.0–4.4)
Cholesterol, Total: 127 mg/dL (ref 100–199)
HDL: 47 mg/dL (ref >39)
LDL Chol Calc (NIH): 67 mg/dL (ref 0–99)
Triglycerides: 60 mg/dL (ref 0–149)
VLDL Cholesterol Cal: 13 mg/dL (ref 5–40)

## 2022-10-12 ENCOUNTER — Ambulatory Visit (INDEPENDENT_AMBULATORY_CARE_PROVIDER_SITE_OTHER): Payer: 59 | Admitting: *Deleted

## 2022-10-12 DIAGNOSIS — E538 Deficiency of other specified B group vitamins: Secondary | ICD-10-CM

## 2022-10-12 NOTE — Progress Notes (Signed)
GYNECOLOGY  VISIT   HPI: 51 y.o.   Single  African American  female   [id number] with No LMP recorded (approximate). Patient is perimenopausal.   here for   endo bx.  She is followed for proliferative endometrium treated with daily Provera 10 mg.  Takes a dose late once a month. Some nausea.  No bleeding.   FSH 42.8 and estradiol 17.   UPT negative today.  GYNECOLOGIC HISTORY: No LMP recorded (approximate). Patient is perimenopausal. Contraception:  perimenopausal Menopausal hormone therapy:  n/a Last mammogram:   08/16/22 Breast Compsition Category, BI-RADS CATEGORY 1 Negative Last pap smear:   03/23/22 negative: HR HPV negative, 09-26-2019 negative                    OB History     Gravida  0   Para  0   Term  0   Preterm  0   AB  0   Living  0      SAB  0   IAB  0   Ectopic  0   Multiple  0   Live Births                 Patient Active Problem List   Diagnosis Date Noted   Neck pain 02/02/2022   GAD (generalized anxiety disorder) 12/16/2021   Hypertension 09/10/2020   GERD without esophagitis 05/02/2019   BMI 45.0-49.9, adult (HCC) 05/02/2019   Low serum vitamin B12 11/17/2016    Past Medical History:  Diagnosis Date   Anemia    Arthritis    neck, right foot, left shoulder   Balance disorder    06-27-2022  per pt at times "feels off balance without dizziness" , stated has had neurology/ cardiology/ and ENT work-up without etiology   Endometrial polyp    GERD (gastroesophageal reflux disease)    Hypertension    MVP (mitral valve prolapse)    no evidence per echo in epic 10-21-2020, has trivial mitral valve regurg   OSA on CPAP 10/2021   followed by neurologist--- dr Frances Furbish;  sleep study in epic 10-26-2021 severe osa ( 06-27-2022 per pt uses apap nightly)   PMB (postmenopausal bleeding)    Pre-diabetes    PVC's (premature ventricular contractions)    followed by cardiologist--- dr branch;  last event monitor 11-11-2020  frequent PVCs (6%),  NSR w/ rare SVT;   low risk nuclear study 10-21-2020 w/ no ischemia   Vitamin B 12 deficiency    Vitamin D deficiency 2022   Wears glasses     Past Surgical History:  Procedure Laterality Date   DILATATION & CURETTAGE/HYSTEROSCOPY WITH MYOSURE N/A 07/11/2022   Procedure: DILATATION & CURETTAGE/HYSTEROSCOPY WITH MYOSURE RESECTION OF ENDOMETRIAL POLYPS;  Surgeon: Patton Salles, MD;  Location: [facility];  Service: Gynecology;  Laterality: N/A;   NO PAST SURGERIES      Current Outpatient Medications  Medication Sig Dispense Refill   acetaminophen (TYLENOL) 650 MG CR tablet Take 1,300 mg by mouth daily.     amLODipine (NORVASC) 2.5 MG tablet Take 1 tablet (2.5 mg total) by mouth daily. 90 tablet 1   Carboxymethylcellul-Glycerin (CLEAR EYES FOR DRY EYES OP) Place 1-2 drops into both eyes daily as needed (dry eyes).     Cholecalciferol (VITAMIN D3) 50 MCG (2000 UT) TABS Take 1 tablet by mouth daily after lunch.     clindamycin (CLEOCIN-T) 1 % external solution Apply topically 2 (two) times daily. (  Patient taking differently: Apply topically 2 (two) times daily as needed.) 30 mL 1   escitalopram (LEXAPRO) 10 MG tablet Take 1 tablet (10 mg total) by mouth daily. 90 tablet 1   fexofenadine (ALLEGRA) 180 MG tablet TAKE 1 TABLET BY MOUTH EVERY DAY (Patient taking differently: Take 180 mg by mouth daily after lunch.) 90 tablet 1   fluticasone (FLONASE) 50 MCG/ACT nasal spray SPRAY 2 SPRAYS INTO EACH NOSTRIL EVERY DAY (Patient taking differently: Place 2 sprays into both nostrils daily as needed for allergies.) 48 mL 1   hydrochlorothiazide (HYDRODIURIL) 25 MG tablet Take 1 tablet (25 mg total) by mouth daily. 90 tablet 1   ibuprofen (ADVIL) 600 MG tablet Take 1 tablet (600 mg total) by mouth every 6 (six) hours as needed. 30 tablet 0   KRILL OIL PO Take 350 mg by mouth daily.      medroxyPROGESTERone (PROVERA) 10 MG tablet Take 1 tablet (10 mg total) by mouth daily. 30 tablet  3   metoprolol tartrate (LOPRESSOR) 25 MG tablet TAKE 1/2 TABLET (12.5 MG TOTAL) BY MOUTH 2 (TWO) TIMES DAILY AS NEEDED (PALPITATIONS). 90 tablet 1   omeprazole (PRILOSEC) 20 MG capsule Take 1 capsule (20 mg total) by mouth daily. 90 capsule 1   Probiotic Product (PROBIOTIC PO) Take by mouth daily.     rosuvastatin (CRESTOR) 10 MG tablet Take 1 tablet (10 mg total) by mouth daily. 90 tablet 1   Current Facility-Administered Medications  Medication Dose Route Frequency Provider Last Rate Last Admin   cyanocobalamin ((VITAMIN B-12)) injection 1,000 mcg  1,000 mcg Intramuscular Q30 days Chevis Pretty, FNP   1,000 mcg at 10/12/22 1035     ALLERGIES: Eggs or egg-derived products, Fluarix [influenza virus vaccine], Other, Peanut-containing drug products, Penicillins, Apple, Blueberry flavor, Kiwi extract, Watermelon flavor, Amoxicillin, and Sulfa antibiotics  Family History  Problem Relation Age of Onset   Hypertension Mother    Diabetes Mother    Other Mother        renal disease   Hypertension Father    Heart attack Father    Hypertension Sister    Diabetes Brother    Diabetes Brother    Sleep apnea Neg Hx     Social History   Socioeconomic History   Marital status: Single    Spouse name: Not on file   Number of children: Not on file   Years of education: Not on file   Highest education level: Not on file  Occupational History   Not on file  Tobacco Use   Smoking status: Never   Smokeless tobacco: Never  Vaping Use   Vaping Use: Never used  Substance and Sexual Activity   Alcohol use: No    Alcohol/week: 0.0 standard drinks of alcohol   Drug use: Never   Sexual activity: Not Currently    Partners: Male    Birth control/protection: Abstinence  Other Topics Concern   Not on file  Social History Narrative   Not on file   Social Determinants of Health   Financial Resource Strain: Not on file  Food Insecurity: Not on file  Transportation Needs: Not on file   Physical Activity: Not on file  Stress: Not on file  Social Connections: Not on file  Intimate Partner Violence: Not on file    Review of Systems  PHYSICAL EXAMINATION:    Ht 5' 5.5" (1.664 m)   Wt 292 lb (132.5 kg)   LMP  (Approximate)   BMI 47.85 kg/m  General appearance: alert, cooperative and appears stated age   EMB: Consent for procedure. Sterile prep with Hibiclens Paracervical block with 1% lidocaine 6 cc, lot number   [id number], expiration 12/08/23 Tenaculum to anterior cervical lip. Pipelle passed to 7 cm twice.   Tissue to pathology.  Minimal EBL. No complications.   Chaperone was present for exam:  Raquel Sarna  ASSESSMENT  History of postmenopausal bleeding.  Proliferative endometrium.  On daily Provera 10 mg.   PLAN  FU EMB.  Post biopsy precautions given. Continue Provera 10 mg daily.  Refill given for another 9 months.  Alternative of Mirena IUD discussed.  We broached the idea of hysterectomy if the oral progesterone or a Mirena IUD were not successful to reverse the proliferation of the endometrium.  Next EMB likely in 6 months.    An After Visit Summary was printed and given to the patient.  10 min  total time was spent for this patient encounter, including preparation, face-to-face counseling with the patient, coordination of care, and documentation of the encounter.

## 2022-10-25 ENCOUNTER — Other Ambulatory Visit (HOSPITAL_COMMUNITY)
Admission: RE | Admit: 2022-10-25 | Discharge: 2022-10-25 | Disposition: A | Discharge status: Home / Self Care | Payer: 59 | Source: Ambulatory Visit | Attending: Obstetrics and Gynecology | Admitting: Obstetrics and Gynecology

## 2022-10-25 ENCOUNTER — Encounter: Payer: Self-pay | Admitting: Obstetrics and Gynecology

## 2022-10-25 ENCOUNTER — Ambulatory Visit: Payer: 59 | Admitting: Obstetrics and Gynecology

## 2022-10-25 VITALS — BP 124/82 | HR 68 | Ht 65.5 in | Wt 292.0 lb

## 2022-10-25 DIAGNOSIS — N858 Other specified noninflammatory disorders of uterus: Secondary | ICD-10-CM | POA: Diagnosis not present

## 2022-10-25 DIAGNOSIS — Z8742 Personal history of other diseases of the female genital tract: Secondary | ICD-10-CM

## 2022-10-25 DIAGNOSIS — Z01812 Encounter for preprocedural laboratory examination: Secondary | ICD-10-CM | POA: Diagnosis not present

## 2022-10-25 LAB — PREGNANCY, URINE: Preg Test, Ur: NEGATIVE

## 2022-10-25 MED ORDER — MEDROXYPROGESTERONE ACETATE 10 MG PO TABS: 10.0000 mg | ORAL_TABLET | Freq: Every day | ORAL | 2 refills | Status: DC

## 2022-10-25 NOTE — Addendum Note (Signed)
Addended by: Melrose Nakayama on: 10/25/2022 09:42 AM   Modules accepted: Orders

## 2022-10-25 NOTE — Patient Instructions (Signed)
Endometrial Biopsy  An endometrial biopsy is a procedure to remove tissue samples from the endometrium, which is the lining of the uterus. The tissue that is removed can then be checked under a microscope for disease. This procedure is used to diagnose conditions such as endometrial cancer, endometrial tuberculosis, polyps, or other inflammatory conditions. This procedure may also be used to investigate uterine bleeding to determine where you are in your menstrual cycle or how your hormone levels are affecting the lining of the uterus. Tell a health care provider about: Any allergies you have. All medicines you are taking, including vitamins, herbs, eye drops, creams, and over-the-counter medicines. Any problems you or family members have had with anesthetic medicines. Any bleeding problems you have. Any surgeries you have had. Any medical conditions you have. Whether you are pregnant or may be pregnant. What are the risks? Your health care provider will talk with you about risks. These may include: Bleeding. Pelvic infection. Puncture of the wall of the uterus with the biopsy device (rare). Allergic reactions to medicines. What happens before the procedure? Keep a record of your menstrual cycles as told by your health care provider. You may need to schedule your procedure for a specific time in your cycle. Bring a sanitary pad in case you need to wear one after the procedure. Ask your health care provider about: Changing or stopping your regular medicines. These include any diabetes medicines or blood thinners you take. Taking medicines such as aspirin and ibuprofen. These medicines can thin your blood. Do not take these medicines unless your health care provider tells you to. Taking over-the-counter medicines, vitamins, herbs, and supplements. Plan to have someone take you home from the hospital or clinic. What happens during the procedure? You will lie on an exam table with your feet  and legs supported as in a pelvic exam. Your health care provider will insert an instrument into your vagina to see your cervix. Your cervix will be cleansed with an antiseptic solution. A medicine (local anesthetic) will be used to numb the cervix. A forceps instrument will be used to hold your cervix steady for the biopsy. A thin, rod-like instrument (uterine sound) will be inserted through your cervix to determine the length of your uterus and the location where the biopsy sample will be removed. A thin, flexible tube (catheter) will be inserted through your cervix and into the uterus. The catheter will be used to collect the biopsy sample from your endometrial tissue. The tube and instruments will be removed, and the tissue sample will be sent to a lab for examination. The procedure may vary among health care providers and hospitals. What happens after the procedure? Your blood pressure, heart rate, breathing rate, and blood oxygen level will be monitored until you leave the hospital or clinic. It is up to you to get the results of your procedure. Ask your health care provider, or the department that is doing the procedure, when your results will be ready. Summary An endometrial biopsy is a procedure to remove tissue samples from the endometrium, which is the lining of the uterus. This procedure is used to diagnose conditions such as endometrial cancer, endometrial tuberculosis, polyps, or other inflammatory conditions. It is up to you to get the results of your procedure. Ask your health care provider, or the department that is doing the procedure, when your results will be ready. This information is not intended to replace advice given to you by your health care provider. Make sure you   discuss any questions you have with your health care provider. Document Revised: 02/07/2022 Document Reviewed: 02/07/2022 Elsevier Patient Education  2023 Elsevier Inc.  

## 2022-10-26 ENCOUNTER — Other Ambulatory Visit: Payer: Self-pay

## 2022-10-26 DIAGNOSIS — N950 Postmenopausal bleeding: Secondary | ICD-10-CM

## 2022-10-26 LAB — SURGICAL PATHOLOGY

## 2022-10-28 ENCOUNTER — Other Ambulatory Visit: Payer: Self-pay | Admitting: Nurse Practitioner

## 2022-11-13 ENCOUNTER — Ambulatory Visit: Payer: 59 | Admitting: *Deleted

## 2022-11-13 DIAGNOSIS — E538 Deficiency of other specified B group vitamins: Secondary | ICD-10-CM

## 2022-11-13 NOTE — Progress Notes (Signed)
Vitamin b12 injection given and patient tolerated well.  

## 2022-11-29 ENCOUNTER — Telehealth: Payer: Self-pay | Admitting: *Deleted

## 2022-11-29 NOTE — Telephone Encounter (Signed)
I called pt and relayed Dr. Guadelupe Sabin note.  Pt relayed not to complete, but can send last ofv note.  Deferred to pcp and orthopedics. Form to medical records (with last office note).

## 2022-11-29 NOTE — Telephone Encounter (Signed)
Received form for [employer] (asking to complete medical work capacity form) .  I called pt and she was not sure who did the initial form.  She is using APAP ([facility]).  Has appt here 01/2023 with MM/NP for OSA cpap. Pt states that she is out of work for dizziness, off balance, can't drive).  Not sure what is causing the dizziness.  Thought vertigo, migraine.  She is not driving.  She was surprised that we got form.  Sending to all her providers.

## 2022-11-29 NOTE — Telephone Encounter (Signed)
Please call patient and advise her that there are no work-related restrictions needed from the neurological standpoint.  If she would like for me to sign off on the form, I would be happy to do so.  There are no restrictions or accommodations justified from the neurological standpoint.

## 2022-12-04 ENCOUNTER — Telehealth (INDEPENDENT_AMBULATORY_CARE_PROVIDER_SITE_OTHER): Payer: 59 | Admitting: Family Medicine

## 2022-12-04 ENCOUNTER — Encounter: Payer: Self-pay | Admitting: Family Medicine

## 2022-12-04 DIAGNOSIS — J069 Acute upper respiratory infection, unspecified: Secondary | ICD-10-CM | POA: Diagnosis not present

## 2022-12-04 LAB — VERITOR FLU A/B WAIVED
Influenza A: NEGATIVE
Influenza B: NEGATIVE

## 2022-12-04 NOTE — Progress Notes (Signed)
Virtual Visit via telephone Note  I connected with Renee Thomas on 12/04/22 at 1330 by telephone and verified that I am speaking with the correct person using two identifiers. Renee Thomas is currently located at home and patient are currently with her during visit. The provider, Fransisca Kaufmann Taran Haynesworth, MD is located in their office at time of visit.  Call ended at 1338  I discussed the limitations, risks, security and privacy concerns of performing an evaluation and management service by telephone and the availability of in person appointments. I also discussed with the patient that there may be a patient responsible charge related to this service. The patient expressed understanding and agreed to proceed.   History and Present Illness: Patient is calling in for sinus drainage and sore throat and hoarseness.  Her left ear has been hurting.  She has sinus pressure and headaches.  She has sinus drainage for a couple months. She started feeling worse yesterday. Her nephew was sick and had viral infection. She is using mucinex and tylenol and flonase and just started it today. She got flu vaccine and has had 2 covid vaccines. She denies SOB or wheezing.   1. Upper respiratory tract infection, unspecified type     Outpatient Encounter Medications as of 12/04/2022  Medication Sig   acetaminophen (TYLENOL) 650 MG CR tablet Take 1,300 mg by mouth daily.   amLODipine (NORVASC) 2.5 MG tablet Take 1 tablet (2.5 mg total) by mouth daily.   Carboxymethylcellul-Glycerin (CLEAR EYES FOR DRY EYES OP) Place 1-2 drops into both eyes daily as needed (dry eyes).   Cholecalciferol (VITAMIN D3) 50 MCG (2000 UT) TABS Take 1 tablet by mouth daily after lunch.   clindamycin (CLEOCIN-T) 1 % external solution Apply topically 2 (two) times daily. (Patient taking differently: Apply topically 2 (two) times daily as needed.)   escitalopram (LEXAPRO) 10 MG tablet Take 1 tablet (10 mg total) by mouth daily.   fexofenadine  (ALLEGRA) 180 MG tablet TAKE 1 TABLET BY MOUTH EVERY DAY   fluticasone (FLONASE) 50 MCG/ACT nasal spray SPRAY 2 SPRAYS INTO EACH NOSTRIL EVERY DAY (Patient taking differently: Place 2 sprays into both nostrils daily as needed for allergies.)   hydrochlorothiazide (HYDRODIURIL) 25 MG tablet Take 1 tablet (25 mg total) by mouth daily.   ibuprofen (ADVIL) 600 MG tablet Take 1 tablet (600 mg total) by mouth every 6 (six) hours as needed.   KRILL OIL PO Take 350 mg by mouth daily.    medroxyPROGESTERone (PROVERA) 10 MG tablet Take 1 tablet (10 mg total) by mouth daily.   metoprolol tartrate (LOPRESSOR) 25 MG tablet TAKE 1/2 TABLET (12.5 MG TOTAL) BY MOUTH 2 (TWO) TIMES DAILY AS NEEDED (PALPITATIONS).   omeprazole (PRILOSEC) 20 MG capsule Take 1 capsule (20 mg total) by mouth daily.   Probiotic Product (PROBIOTIC PO) Take by mouth daily.   rosuvastatin (CRESTOR) 10 MG tablet Take 1 tablet (10 mg total) by mouth daily.   No facility-administered encounter medications on file as of 12/04/2022.    Review of Systems  Constitutional:  Negative for chills and fever.  HENT:  Positive for congestion, postnasal drip, rhinorrhea, sinus pressure, sneezing and sore throat. Negative for ear discharge and ear pain.   Eyes:  Negative for pain, redness and visual disturbance.  Respiratory:  Positive for cough. Negative for chest tightness and shortness of breath.   Cardiovascular:  Negative for chest pain and leg swelling.  Genitourinary:  Negative for difficulty urinating and dysuria.  Musculoskeletal:  Negative for back pain and gait problem.  Skin:  Negative for rash.  Neurological:  Negative for light-headedness and headaches.  Psychiatric/Behavioral:  Negative for agitation and behavioral problems.   All other systems reviewed and are negative.   Observations/Objective: Patient sounds comfortable   Assessment and Plan: Problem List Items Addressed This Visit   None Visit Diagnoses     Upper  respiratory tract infection, unspecified type    -  Primary   Relevant Orders   Novel Coronavirus, NAA ([facility])   Veritor Flu A/B Waived       Patient can get video to work.  Will have her come in for flu and COVID testing and evaluate from there, in the meantime recommended Mucinex and Flonase and nasal saline washes Tylenol Sinus Follow up plan: Return if symptoms worsen or fail to improve.     I discussed the assessment and treatment plan with the patient. The patient was provided an opportunity to ask questions and all were answered. The patient agreed with the plan and demonstrated an understanding of the instructions.   The patient was advised to call back or seek an in-person evaluation if the symptoms worsen or if the condition fails to improve as anticipated.  The above assessment and management plan was discussed with the patient. The patient verbalized understanding of and has agreed to the management plan. Patient is aware to call the clinic if symptoms persist or worsen. Patient is aware when to return to the clinic for a follow-up visit. Patient educated on when it is appropriate to go to the emergency department.    I provided 8 minutes of non-face-to-face time during this encounter.    Worthy Rancher, MD

## 2022-12-05 LAB — NOVEL CORONAVIRUS, NAA: SARS-CoV-2, NAA: DETECTED — AB

## 2022-12-06 ENCOUNTER — Other Ambulatory Visit: Payer: Self-pay | Admitting: Family Medicine

## 2022-12-06 MED ORDER — NIRMATRELVIR/RITONAVIR (PAXLOVID)TABLET: 3.0000 | ORAL_TABLET | Freq: Two times a day (BID) | ORAL | 0 refills | Status: AC

## 2022-12-14 ENCOUNTER — Ambulatory Visit: Payer: 59

## 2022-12-18 ENCOUNTER — Ambulatory Visit (INDEPENDENT_AMBULATORY_CARE_PROVIDER_SITE_OTHER): Payer: 59 | Admitting: *Deleted

## 2022-12-18 DIAGNOSIS — E538 Deficiency of other specified B group vitamins: Secondary | ICD-10-CM | POA: Diagnosis not present

## 2022-12-18 MED ORDER — CYANOCOBALAMIN 1000 MCG/ML IJ SOLN
1000.0000 ug | Freq: Once | INTRAMUSCULAR | Status: AC
  Administered 2022-12-18: 1000 ug via INTRAMUSCULAR

## 2022-12-18 NOTE — Progress Notes (Signed)
B12 1000 mcg given IM in Left deltoid. Patient tolerated well.

## 2022-12-20 ENCOUNTER — Other Ambulatory Visit: Payer: Self-pay | Admitting: Nurse Practitioner

## 2022-12-20 DIAGNOSIS — F411 Generalized anxiety disorder: Secondary | ICD-10-CM

## 2023-01-03 NOTE — Progress Notes (Signed)
GYNECOLOGY  VISIT   HPI: 52 y.o.   Single  African American  female   [place] with Patient's last menstrual period was 12/27/2022 (approximate).   here for endometrial biopsy. Patient is followed for postmenopausal bleeding and proliferative endometrium with no hyperplasia or malignancy.  Has had vaginal spotting off and on for 3 weeks following taking Paxlovid for Covid. She takes Provera 10 mg daily and has not missed any dosages.  Had hysteroscopy, dilation and curettage and resection of endometrial polyp 07/11/22.  Final pathology 07/11/22 showed inactive endometrium and benign endometrial polyp.  No atypia or malignancy were seen.   Her pre-surgical endometrial biopsy showed proliferative endometrium on 05/11/22.   [place] was 42.8 and estradiol 17 on 06/14/22.  [place] 61 and estradiol 16 on 03/16/21.   Last EMB on 10/25/22 showed benign endocervical tissue and no endometrium.   Occasional hot flash.  No night seats.   Provera does cause some nausea.   UPT negative.   GYNECOLOGIC HISTORY: Patient's last menstrual period was 12/27/2022 (approximate). Noticed spotting in the last three weeks Contraception:  perimenopause Menopausal hormone therapy:  n/a Last mammogram:  08/16/22 Breast Density Category A, BI-RADS CATEGORY 1 neg Last pap smear:   03/23/22 neg: HR HPV neg, 09/26/19 neg        OB History     Gravida  0   Para  0   Term  0   Preterm  0   AB  0   Living  0      SAB  0   IAB  0   Ectopic  0   Multiple  0   Live Births                 Patient Active Problem List   Diagnosis Date Noted   Neck pain 02/02/2022   GAD (generalized anxiety disorder) 12/16/2021   Hypertension 09/10/2020   GERD without esophagitis 05/02/2019   BMI 45.0-49.9, adult ([place]) 05/02/2019   Low serum vitamin B12 11/17/2016    Past Medical History:  Diagnosis Date   Anemia    Arthritis    neck, right foot, left shoulder   Balance disorder    06-27-2022  per pt at times  "feels off balance without dizziness" , stated has had neurology/ cardiology/ and ENT work-up without etiology   Endometrial polyp    GERD (gastroesophageal reflux disease)    Hypertension    MVP (mitral valve prolapse)    no evidence per echo in epic 10-21-2020, has trivial mitral valve regurg   OSA on CPAP 10/2021   followed by neurologist--- dr Rexene Alberts;  sleep study in epic 10-26-2021 severe osa ( 06-27-2022 per pt uses apap nightly)   PMB (postmenopausal bleeding)    Pre-diabetes    PVC's (premature ventricular contractions)    followed by cardiologist--- dr branch;  last event monitor 11-11-2020  frequent PVCs (6%), NSR w/ rare SVT;   low risk nuclear study 10-21-2020 w/ no ischemia   Vitamin B 12 deficiency    Vitamin D deficiency 2022   Wears glasses     Past Surgical History:  Procedure Laterality Date   DILATATION & CURETTAGE/HYSTEROSCOPY WITH MYOSURE N/A 07/11/2022   Procedure: DILATATION & CURETTAGE/HYSTEROSCOPY WITH MYOSURE RESECTION OF ENDOMETRIAL POLYPS;  Surgeon: Nunzio Cobbs, MD;  Location: [facility];  Service: Gynecology;  Laterality: N/A;   NO PAST SURGERIES      Current Outpatient Medications  Medication Sig Dispense Refill  acetaminophen (TYLENOL) 650 MG CR tablet Take 1,300 mg by mouth daily.     amLODipine (NORVASC) 2.5 MG tablet Take 1 tablet (2.5 mg total) by mouth daily. 90 tablet 1   Carboxymethylcellul-Glycerin (CLEAR EYES FOR DRY EYES OP) Place 1-2 drops into both eyes daily as needed (dry eyes).     Cholecalciferol (VITAMIN D3) 50 MCG (2000 UT) TABS Take 1 tablet by mouth daily after lunch.     clindamycin (CLEOCIN-T) 1 % external solution Apply topically 2 (two) times daily. (Patient taking differently: Apply topically 2 (two) times daily as needed.) 30 mL 1   Cyanocobalamin (B-12 COMPLIANCE INJECTION IJ) Inject as directed every 30 (thirty) days.     escitalopram (LEXAPRO) 10 MG tablet Take 1 tablet (10 mg total) by mouth  daily. 90 tablet 1   fexofenadine (ALLEGRA) 180 MG tablet TAKE 1 TABLET BY MOUTH EVERY DAY 90 tablet 1   fluticasone (FLONASE) 50 MCG/ACT nasal spray SPRAY 2 SPRAYS INTO EACH NOSTRIL EVERY DAY (Patient taking differently: Place 2 sprays into both nostrils daily as needed for allergies.) 48 mL 1   hydrochlorothiazide (HYDRODIURIL) 25 MG tablet Take 1 tablet (25 mg total) by mouth daily. 90 tablet 1   ibuprofen (ADVIL) 600 MG tablet Take 1 tablet (600 mg total) by mouth every 6 (six) hours as needed. 30 tablet 0   KRILL OIL PO Take 350 mg by mouth daily.      medroxyPROGESTERone (PROVERA) 10 MG tablet Take 1 tablet (10 mg total) by mouth daily. 90 tablet 2   metoprolol tartrate (LOPRESSOR) 25 MG tablet TAKE 1/2 TABLET (12.5 MG TOTAL) BY MOUTH 2 (TWO) TIMES DAILY AS NEEDED (PALPITATIONS). 90 tablet 1   nystatin (MYCOSTATIN/NYSTOP) powder Apply 1 Application topically 3 (three) times daily. Apply to affected area for up to 7 days 30 g 2   omeprazole (PRILOSEC) 20 MG capsule Take 1 capsule (20 mg total) by mouth daily. 90 capsule 1   Probiotic Product (PROBIOTIC PO) Take by mouth daily.     rosuvastatin (CRESTOR) 10 MG tablet Take 1 tablet (10 mg total) by mouth daily. 90 tablet 1   Current Facility-Administered Medications  Medication Dose Route Frequency Provider Last Rate Last Admin   cyanocobalamin (VITAMIN B12) injection 1,000 mcg  1,000 mcg Intramuscular Q30 days Chevis Pretty, FNP   1,000 mcg at 01/15/23 6283     ALLERGIES: Egg-derived products, Fluarix [influenza virus vaccine], Other, Peanut-containing drug products, Penicillins, Apple, Blueberry flavor, Kiwi extract, Watermelon flavor, Amoxicillin, and Sulfa antibiotics  Family History  Problem Relation Age of Onset   Hypertension Mother    Diabetes Mother    Other Mother        renal disease   Hypertension Father    Heart attack Father    Hypertension Sister    Diabetes Brother    Diabetes Brother    Sleep apnea Neg Hx      Social History   Socioeconomic History   Marital status: Single    Spouse name: Not on file   Number of children: Not on file   Years of education: Not on file   Highest education level: Not on file  Occupational History   Not on file  Tobacco Use   Smoking status: Never   Smokeless tobacco: Never  Vaping Use   Vaping Use: Never used  Substance and Sexual Activity   Alcohol use: No    Alcohol/week: 0.0 standard drinks of alcohol   Drug use: Never  Sexual activity: Not Currently    Partners: Male    Birth control/protection: Abstinence  Other Topics Concern   Not on file  Social History Narrative   Not on file   Social Determinants of Health   Financial Resource Strain: Not on file  Food Insecurity: Not on file  Transportation Needs: Not on file  Physical Activity: Not on file  Stress: Not on file  Social Connections: Not on file  Intimate Partner Violence: Not on file    Review of Systems  All other systems reviewed and are negative.   PHYSICAL EXAMINATION:    BP 126/84 (BP Location: Left Arm, Patient Position: Sitting, Cuff Size: Large)   Pulse 80   Ht 5\' 6"  (1.676 m)   Wt 292 lb (132.5 kg)   LMP 12/27/2022 (Approximate)   SpO2 97%   BMI 47.13 kg/m     General appearance: alert, cooperative and appears stated age Abdomen: pannus present. Skin: erythema of left groin and under panus on left side.  Pelvic: External genitalia:  no lesions              Urethra:  normal appearing urethra with no masses, tenderness or lesions              Bartholins and Skenes: normal                 Vagina: normal appearing vagina with normal color and discharge, no lesions              Cervix: no lesions.  Blood noted.                 Bimanual Exam:  Uterus:  normal size, contour, position, consistency, mobility, non-tender              Adnexa: no mass, fullness, tenderness            EMB: Consent for procedure. Sterile prep with Hibiclens. Paracervical block  with 1% lidocaine 6 cc, lot number  [id number], expiration 05/2025 Tenaculum to anterior cervical lip. Rocket EMB passed to almost 9 cm twice.   Tissue to pathology.  Minimal EBL. No complications.   Chaperone was present for exam:  Raquel Sarna  ASSESSMENT  Postmenopausal bleeding.  Hx proliferative endometrium and benign endometrial polyp.  On Provera 10 mg daily.  [place] of flexural skin fold.   PLAN  FU EMB.  Continue Provera 10 mg daily for now.  We discussed potential tx options of increasing Provera to 20 mg daily, Mirena IUD, recurrent hysteroscopy with dilation and curettage and laparoscopic hysterectomy.  Final plan to follow.  Patient is leaning toward increasing the dosage of the Provera as her current choice of care.  Rx for Nystatin powder.  Annual exam due in May, 2024.    An After Visit Summary was printed and given to the patient.  30 min  total time was spent for this patient encounter, including preparation, face-to-face counseling with the patient, coordination of care, and documentation of the encounter in addition to doing endometrial biopsy.

## 2023-01-11 ENCOUNTER — Ambulatory Visit: Payer: 59 | Admitting: Neurology

## 2023-01-11 ENCOUNTER — Encounter: Payer: Self-pay | Admitting: Neurology

## 2023-01-11 VITALS — BP 99/60 | HR 54 | Ht 65.0 in | Wt 294.0 lb

## 2023-01-11 DIAGNOSIS — R634 Abnormal weight loss: Secondary | ICD-10-CM

## 2023-01-11 DIAGNOSIS — G4733 Obstructive sleep apnea (adult) (pediatric): Secondary | ICD-10-CM | POA: Diagnosis not present

## 2023-01-11 NOTE — Patient Instructions (Signed)
Please continue using your autoPAP regularly. While your insurance requires that you use PAP at least 4 hours each night on 70% of the nights, I recommend, that you not skip any nights and use it throughout the night if you can. Getting used to PAP and staying with the treatment long term does take time and patience and discipline. Untreated obstructive sleep apnea when it is moderate to severe can have an adverse impact on cardiovascular health and raise her risk for heart disease, arrhythmias, hypertension, congestive heart failure, stroke and diabetes. Untreated obstructive sleep apnea causes sleep disruption, nonrestorative sleep, and sleep deprivation. This can have an impact on your day to day functioning and cause daytime sleepiness and impairment of cognitive function, memory loss, mood disturbance, and problems focussing. Using PAP regularly can improve these symptoms.   We can see you in 1 year, you can see one of our nurse practitioners as you are stable.

## 2023-01-11 NOTE — Progress Notes (Signed)
Subjective:    Patient ID: Renee Thomas is a 52 y.o. female.  HPI    Interim history:   Renee Thomas is a 52 year old right-handed woman with an underlying medical history of anemia, reflux disease, vitamin D deficiency, mitral valve prolapse, PVCs, vitamin B12 deficiency, and morbid obesity with a BMI of over 31, who pending presents for follow-up consultation of her OSA, on autoPAP therapy. The patient is accompanied by her sister today. I last saw her on 01/04/2022, at which time she reported overall doing better.  She was compliant with her AutoPap and benefited from it.  She had started a new anxiety medication but could not tolerate it.  Today, 01/11/2023: I reviewed her AutoPap compliance data from 12/10/2022 through 01/08/2023, which is a total of 30 days, during which time she used her machine every night with percent use days greater than 4 hours at 100%, indicating superb compliance, average usage of 8 hours and 10 minutes, residual AHI at goal at 0.1/h, leak on the low side with the 95th percentile at 0.9 L/min, 95th percentile of pressure at 7.4 cm with a range of 6 to 13 cm.  She reports overall doing well.  Her heart rate and her blood pressure are a little low today but she does not really have any symptoms, did not actually have breakfast yet.  She is compliant with her medications and doing well with her supplies for AutoPap and continues to benefit from it.  She is very motivated to continue with treatment.  She is working on weight loss and compared to last year has lost over 10 pounds.  She had COVID about a month ago.  Her sister and her nephew also had COVID.  She does have some dry mouth and her right side of the nose gets congested.  She uses an under the nose fullface mask.     The patient's allergies, current medications, family history, past medical history, past social history, past surgical history and problem list were reviewed and updated as appropriate.    Previously:      I  first met her at the request of her primary care nurse practitioner on 10/06/2021, at which time she reported a 2-year history of intermittent lightheadedness and feeling off balance.  She also reported weight gain and nonrestorative sleep.  She was advised to proceed with a brain MRI.  She had a brain MRI with and without contrast on 11/02/2021 and I reviewed the results: IMPRESSION: This MRI of the brain with and without contrast shows the following: 1.   Few scattered T2/FLAIR hyperintense foci in the subcortical or deep white matter consistent with minimal age-related chronic microvascular ischemic change.  None of the foci enhance or appear to be acute. 2.   No acute findings and normal enhancement pattern.   She was notified by phone call.   We checked some blood work, A1c was in the prediabetes range at 6.0, vitamin D level normal, TSH normal. She had a home sleep test on 10/26/2021 which indicated severe obstructive sleep apnea with an AHI of 30.9/h, O2 nadir 75%.  She was advised to start AutoPap 11/24/2021.   I reviewed her AutoPap compliance data from 12/01/2021 through 01/01/2022, which is a total of 32 days, during which time she used her machine every night with percent use days greater than 4 hours at 96.9%, indicating excellent compliance with an average usage of 5 hours and 4 minutes, residual AHI at goal at 0.2/h, average  pressure for the 95th percentile at 7.1 cm with a range of 6 to 13 cm, leak on the low side.    (She) reports an approximately 2-year history of intermittent lightheadedness and feeling off balance.  A couple of times she had to hold onto something but has not actually fallen thankfully.  She denies any actual spinning sensation, she was first suspected to have Mnire's disease by her first ENT and then was sent to another ENT for second opinion. She denies any history of migraines, no severe recurrent or one-sided headaches, no photophobia, no nausea or vomiting.  Has  intermittent head pressure and has also woken up with a headache occasionally.  She does snore, mildly per sister.  She has nocturia about twice per average night.  She does not wake up rested and does endorse daytime tiredness.  She feels that her balance is not the same she feels insecure when walking down or when driving at night.  She is a little overdue for her eye exam, she has prescription progressive lenses.  She denies any sudden onset of one-sided weakness or numbness or tingling or droopy face or slurring of speech, has had intermittent left shoulder discomfort and decrease in range of motion.  Her sister has noticed that she leans to the left when she sits or stands.   She was recently evaluated by ENT and I was able to review the encounter note from 04/20/2021 when she saw Dr. Willette Pa.  Her vestibular testing was benign.  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  She was started on nortriptyline at the time.  She had a head CT without contrast on 08/07/2020 and I reviewed the results: Impression: No acute intracranial abnormality.  She has had recurrent headaches.  She has not had a brain MRI.  She has not had a sleep study.  She had recent blood work on 09/07/2021 and I was able to review the results in her chart: Lipid panel showed total cholesterol 192, triglycerides 68, HDL 52, LDL 127, CBC with differential was benign, hemoglobin 13.1, hematocrit 40.2.  CMP showed glucose of 103, BUN 15, creatinine 0.84, alk phos mildly elevated at 137, AST 20, ALT 23.  Her vitamin D level on 06/09/2021 was borderline at 31, prior to that it was low at 15.  Her latest vitamin B12 level that I could see in her chart was from 09/10/2020 and was in the normal range at 1189.  She presented to the emergency room at [facility] on 05/08/2021 with 2 episodes of feeling lightheaded that day.  She felt like she was going to pass out.  I reviewed the emergency room records.  She had mild tachycardia but no  significant abnormalities on blood testing or EKG.   She feels that the nortriptyline did help with her head pressure.  She has elevated blood pressure values and takes metoprolol as needed, hydrochlorothiazide in the evening and amlodipine in the evening.  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   She does not drink any alcohol, she does not drink any caffeine currently.  She is a non-smoker.  She has a nephew with sleep apnea.      Her Past Medical History Is Significant For: Past Medical History:  Diagnosis Date   Anemia    Arthritis    neck, right foot, left shoulder   Balance disorder    06-27-2022  per pt at times "feels off balance without dizziness" ,  stated has had neurology/ cardiology/ and ENT work-up without etiology   Endometrial polyp    GERD (gastroesophageal reflux disease)    Hypertension    MVP (mitral valve prolapse)    no evidence per echo in epic 10-21-2020, has trivial mitral valve regurg   OSA on CPAP 10/2021   followed by neurologist--- dr Rexene Alberts;  sleep study in epic 10-26-2021 severe osa ( 06-27-2022 per pt uses apap nightly)   PMB (postmenopausal bleeding)    Pre-diabetes    PVC's (premature ventricular contractions)    followed by cardiologist--- dr branch;  last event monitor 11-11-2020  frequent PVCs (6%), NSR w/ rare SVT;   low risk nuclear study 10-21-2020 w/ no ischemia   Vitamin B 12 deficiency    Vitamin D deficiency 2022   Wears glasses     Her Past Surgical History Is Significant For: Past Surgical History:  Procedure Laterality Date   DILATATION & CURETTAGE/HYSTEROSCOPY WITH MYOSURE N/A 07/11/2022   Procedure: DILATATION & CURETTAGE/HYSTEROSCOPY WITH MYOSURE RESECTION OF ENDOMETRIAL POLYPS;  Surgeon: Nunzio Cobbs, MD;  Location: [facility];  Service: Gynecology;  Laterality: N/A;   NO PAST SURGERIES      Her Family History Is Significant For: Family History  Problem Relation Age of  Onset   Hypertension Mother    Diabetes Mother    Other Mother        renal disease   Hypertension Father    Heart attack Father    Hypertension Sister    Diabetes Brother    Diabetes Brother    Sleep apnea Neg Hx     Her Social History Is Significant For: Social History   Socioeconomic History   Marital status: Single    Spouse name: Not on file   Number of children: Not on file   Years of education: Not on file   Highest education level: Not on file  Occupational History   Not on file  Tobacco Use   Smoking status: Never   Smokeless tobacco: Never  Vaping Use   Vaping Use: Never used  Substance and Sexual Activity   Alcohol use: No    Alcohol/week: 0.0 standard drinks of alcohol   Drug use: Never   Sexual activity: Not Currently    Partners: Male    Birth control/protection: Abstinence  Other Topics Concern   Not on file  Social History Narrative   Not on file   Social Determinants of Health   Financial Resource Strain: Not on file  Food Insecurity: Not on file  Transportation Needs: Not on file  Physical Activity: Not on file  Stress: Not on file  Social Connections: Not on file    Her Allergies Are:  Allergies  Allergen Reactions   Egg-Derived Products Swelling    Swells mouth with itching   Fluarix [Influenza Virus Vaccine] Swelling    Contains egg protein.   Other Anaphylaxis    All Tree-Nuts   Peanut-Containing Drug Products Anaphylaxis   Penicillins Rash and Anaphylaxis    .Has patient had a PCN reaction causing immediate rash, facial/tongue/throat swelling, SOB or lightheadedness with hypotension: Yes Has patient had a PCN reaction causing severe rash involving mucus membranes or skin necrosis: No Has patient had a PCN reaction that required hospitalization: no Has patient had a PCN reaction occurring within the last 10 years: no If all of the above answers are "NO", then may proceed with Cephalosporin use.  Other reaction(s): Other (See  Comments) .  Has patient had a PCN reaction causing immediate rash, facial/tongue/throat swelling, SOB or lightheadedness with hypotension: Yes Has patient had a PCN reaction causing severe rash involving mucus membranes or skin necrosis: No Has patient had a PCN reaction that required hospitalization: no Has patient had a PCN reaction occurring within the last 10 years: no If all of the above answers are "NO", then may proceed with Cephalosporin use.   Apple Itching    Mouth itching   Blueberry Flavor Itching   Kiwi Extract Itching    Per pt tongue and throat itching/ burning   Watermelon Flavor Itching    And Canteloupe   Amoxicillin Rash   Sulfa Antibiotics Rash    numbness  :   Her Current Medications Are:  Outpatient Encounter Medications as of 01/11/2023  Medication Sig   acetaminophen (TYLENOL) 650 MG CR tablet Take 1,300 mg by mouth daily.   amLODipine (NORVASC) 2.5 MG tablet Take 1 tablet (2.5 mg total) by mouth daily.   Carboxymethylcellul-Glycerin (CLEAR EYES FOR DRY EYES OP) Place 1-2 drops into both eyes daily as needed (dry eyes).   Cholecalciferol (VITAMIN D3) 50 MCG (2000 UT) TABS Take 1 tablet by mouth daily after lunch.   clindamycin (CLEOCIN-T) 1 % external solution Apply topically 2 (two) times daily. (Patient taking differently: Apply topically 2 (two) times daily as needed.)   Cyanocobalamin (B-12 COMPLIANCE INJECTION IJ) Inject as directed every 30 (thirty) days.   escitalopram (LEXAPRO) 10 MG tablet Take 1 tablet (10 mg total) by mouth daily.   fexofenadine (ALLEGRA) 180 MG tablet TAKE 1 TABLET BY MOUTH EVERY DAY   fluticasone (FLONASE) 50 MCG/ACT nasal spray SPRAY 2 SPRAYS INTO EACH NOSTRIL EVERY DAY (Patient taking differently: Place 2 sprays into both nostrils daily as needed for allergies.)   hydrochlorothiazide (HYDRODIURIL) 25 MG tablet Take 1 tablet (25 mg total) by mouth daily.   ibuprofen (ADVIL) 600 MG tablet Take 1 tablet (600 mg total) by mouth every 6  (six) hours as needed.   KRILL OIL PO Take 350 mg by mouth daily.    medroxyPROGESTERone (PROVERA) 10 MG tablet Take 1 tablet (10 mg total) by mouth daily.   metoprolol tartrate (LOPRESSOR) 25 MG tablet TAKE 1/2 TABLET (12.5 MG TOTAL) BY MOUTH 2 (TWO) TIMES DAILY AS NEEDED (PALPITATIONS).   omeprazole (PRILOSEC) 20 MG capsule Take 1 capsule (20 mg total) by mouth daily.   Probiotic Product (PROBIOTIC PO) Take by mouth daily.   rosuvastatin (CRESTOR) 10 MG tablet Take 1 tablet (10 mg total) by mouth daily.   No facility-administered encounter medications on file as of 01/11/2023.  :  Review of Systems:  Out of a complete 14 point review of systems, all are reviewed and negative with the exception of these symptoms as listed below:  Review of Systems  Neurological:        Pt here for CPAP f/u Pt states that her nose gets stopped up during the night Pt states mouth dry in am     ESS:2     Objective:  Neurological Exam  Physical Exam Physical Examination:   Vitals:   01/11/23 0944  BP: 99/60  Pulse: (!) 54    General Examination: The patient is a very pleasant 52 y.o. female in no acute distress. She appears well-developed and well-nourished and well groomed.   HEENT: Normocephalic, atraumatic, pupils are equal, round and reactive to light, extraocular tracking well-preserved, corrective eyeglasses in place.  Face is symmetric with normal facial animation,  speech is clear without dysarthria, hypophonia or voice tremor.  Airway examination reveals stable findings, mild mouth dryness, adequate dental hygiene.  Tongue protrudes centrally and palate elevates symmetrically. There are no carotid bruits on auscultation.    Chest: Clear to auscultation without wheezing, rhonchi or crackles noted.   Heart: S1+S2+0, regular and normal without murmurs, rubs or gallops noted.    Abdomen: Soft, non-tender and non-distended.   Extremities: There is no pitting edema in the distal lower  extremities bilaterally.    Skin: Warm and dry without trophic changes noted.    Musculoskeletal: exam reveals no obvious joint deformities, stable, with left shoulder height a little higher than right.   Neurologically:  Mental status: The patient is awake, alert and oriented in all 4 spheres. Her immediate and remote memory, attention, language skills and fund of knowledge are appropriate. There is no evidence of aphasia, agnosia, apraxia or anomia. Speech is clear with normal prosody and enunciation. Thought process is linear. Mood is normal and affect is normal.  Cranial nerves II - XII are as described above under HEENT exam.  Motor exam: Normal bulk, strength and tone is noted. There is no tremor.  Fine motor skills and coordination: grossly intact.  Cerebellar testing: No dysmetria or intention tremor. There is no truncal or gait ataxia.   Sensory exam: intact to light touch in the upper and lower extremities.  Gait, station and balance: She stands without difficulty, posture as above, walks without limp or difficulty.  Preserved arm swing bilaterally.   Assessment and Plan:  In summary, Renee Thomas is a very pleasant 52 year old female with an underlying medical history of anemia, reflux disease, vitamin D deficiency, mitral valve prolapse, PVCs, vitamin B12 deficiency, and morbid obesity with a BMI of over 26, who presents for follow-up consultation of her obstructive sleep apnea on AutoPap therapy.  She is compliant with treatment and benefits from it.  She is very commended for treatment adherence.  She is working on weight loss and has lost about 10 pounds since her last visit last year.  She is congratulated on her weight loss success thus far.  Her dizziness and balance issues have improved.  Workup for dizziness was overall benign and reassuring.  She does have a lower blood pressure value and slightly lower heart rate.  She does take a beta-blocker. Work-up with lab work and brain  MRI in December 2022 were benign. From the sleep apnea standpoint, she was found to have severe sleep apnea with home sleep testing in December 2022.  She is well-established on AutoPap therapy since January 2023 and usually up-to-date with her supplies.  She is advised to follow-up in sleep clinic routinely to see one of her nurse medications in 1 year.  I answered all their questions today and the patient and her sister were in agreement.  I spent 30 minutes in total face-to-face time and in reviewing records during pre-charting, more than 50% of which was spent in counseling and coordination of care, reviewing test results, reviewing medications and treatment regimen and/or in discussing or reviewing the diagnosis of OSA, the prognosis and treatment options. Pertinent laboratory and imaging test results that were available during this visit with the patient were reviewed by me and considered in my medical decision making (see chart for details).

## 2023-01-15 ENCOUNTER — Ambulatory Visit (INDEPENDENT_AMBULATORY_CARE_PROVIDER_SITE_OTHER): Payer: 59

## 2023-01-15 DIAGNOSIS — E538 Deficiency of other specified B group vitamins: Secondary | ICD-10-CM | POA: Diagnosis not present

## 2023-01-15 MED ORDER — CYANOCOBALAMIN 1000 MCG/ML IJ SOLN
1000.0000 ug | INTRAMUSCULAR | Status: DC
  Administered 2023-01-15 – 2023-09-03 (×8): 1000 ug via INTRAMUSCULAR

## 2023-01-15 NOTE — Progress Notes (Signed)
Cyanocobalamin injection given to right deltoid.  Patient tolerated well. 

## 2023-01-16 ENCOUNTER — Telehealth: Payer: Self-pay

## 2023-01-16 NOTE — Telephone Encounter (Signed)
Please have her keep her appointment with me for tomorrow.   The bleeding is an indication that we need to proceed with the endometrial biopsy.

## 2023-01-16 NOTE — Telephone Encounter (Signed)
Patient said she is scheduled for endometrial biopsy tomorrow with Dr. Quincy Simmonds. She is having vaginal spotting today and wondered if she needed to reschedule?

## 2023-01-16 NOTE — Telephone Encounter (Signed)
Spoke with patient and informed her. °

## 2023-01-17 ENCOUNTER — Ambulatory Visit: Payer: 59 | Admitting: Obstetrics and Gynecology

## 2023-01-17 ENCOUNTER — Encounter: Payer: Self-pay | Admitting: Obstetrics and Gynecology

## 2023-01-17 ENCOUNTER — Other Ambulatory Visit (HOSPITAL_COMMUNITY)
Admission: RE | Admit: 2023-01-17 | Discharge: 2023-01-17 | Disposition: A | Discharge status: Home / Self Care | Payer: 59 | Source: Ambulatory Visit | Attending: Obstetrics and Gynecology | Admitting: Obstetrics and Gynecology

## 2023-01-17 VITALS — BP 126/84 | HR 80 | Ht 66.0 in | Wt 292.0 lb

## 2023-01-17 DIAGNOSIS — Z01812 Encounter for preprocedural laboratory examination: Secondary | ICD-10-CM | POA: Diagnosis not present

## 2023-01-17 DIAGNOSIS — Z5181 Encounter for therapeutic drug level monitoring: Secondary | ICD-10-CM | POA: Diagnosis not present

## 2023-01-17 DIAGNOSIS — B372 Candidiasis of skin and nail: Secondary | ICD-10-CM | POA: Diagnosis not present

## 2023-01-17 DIAGNOSIS — N950 Postmenopausal bleeding: Secondary | ICD-10-CM

## 2023-01-17 LAB — PREGNANCY, URINE: Preg Test, Ur: NEGATIVE

## 2023-01-17 MED ORDER — NYSTATIN 100000 UNIT/GM EX POWD: 1.0000 | Freq: Three times a day (TID) | CUTANEOUS | 2 refills | Status: AC

## 2023-01-17 NOTE — Patient Instructions (Signed)
Endometrial Biopsy  An endometrial biopsy is a procedure to remove tissue samples from the endometrium, which is the lining of the uterus. The tissue that is removed can then be checked under a microscope for disease. This procedure is used to diagnose conditions such as endometrial cancer, endometrial tuberculosis, polyps, or other inflammatory conditions. This procedure may also be used to investigate uterine bleeding to determine where you are in your menstrual cycle or how your hormone levels are affecting the lining of the uterus. Tell a health care provider about: Any allergies you have. All medicines you are taking, including vitamins, herbs, eye drops, creams, and over-the-counter medicines. Any problems you or family members have had with anesthetic medicines. Any bleeding problems you have. Any surgeries you have had. Any medical conditions you have. Whether you are pregnant or may be pregnant. What are the risks? Your health care provider will talk with you about risks. These may include: Bleeding. Pelvic infection. Puncture of the wall of the uterus with the biopsy device (rare). Allergic reactions to medicines. What happens before the procedure? Keep a record of your menstrual cycles as told by your health care provider. You may need to schedule your procedure for a specific time in your cycle. Bring a sanitary pad in case you need to wear one after the procedure. Ask your health care provider about: Changing or stopping your regular medicines. These include any diabetes medicines or blood thinners you take. Taking medicines such as aspirin and ibuprofen. These medicines can thin your blood. Do not take these medicines unless your health care provider tells you to. Taking over-the-counter medicines, vitamins, herbs, and supplements. Plan to have someone take you home from the hospital or clinic. What happens during the procedure? You will lie on an exam table with your feet  and legs supported as in a pelvic exam. Your health care provider will insert an instrument into your vagina to see your cervix. Your cervix will be cleansed with an antiseptic solution. A medicine (local anesthetic) will be used to numb the cervix. A forceps instrument will be used to hold your cervix steady for the biopsy. A thin, rod-like instrument (uterine sound) will be inserted through your cervix to determine the length of your uterus and the location where the biopsy sample will be removed. A thin, flexible tube (catheter) will be inserted through your cervix and into the uterus. The catheter will be used to collect the biopsy sample from your endometrial tissue. The tube and instruments will be removed, and the tissue sample will be sent to a lab for examination. The procedure may vary among health care providers and hospitals. What happens after the procedure? Your blood pressure, heart rate, breathing rate, and blood oxygen level will be monitored until you leave the hospital or clinic. It is up to you to get the results of your procedure. Ask your health care provider, or the department that is doing the procedure, when your results will be ready. Summary An endometrial biopsy is a procedure to remove tissue samples from the endometrium, which is the lining of the uterus. This procedure is used to diagnose conditions such as endometrial cancer, endometrial tuberculosis, polyps, or other inflammatory conditions. It is up to you to get the results of your procedure. Ask your health care provider, or the department that is doing the procedure, when your results will be ready. This information is not intended to replace advice given to you by your health care provider. Make sure you   discuss any questions you have with your health care provider. Document Revised: 02/07/2022 Document Reviewed: 02/07/2022 Elsevier Patient Education  2023 Elsevier Inc.  

## 2023-01-18 LAB — SURGICAL PATHOLOGY

## 2023-01-22 ENCOUNTER — Other Ambulatory Visit: Payer: Self-pay

## 2023-01-22 DIAGNOSIS — N950 Postmenopausal bleeding: Secondary | ICD-10-CM

## 2023-01-22 MED ORDER — MEDROXYPROGESTERONE ACETATE 10 MG PO TABS: ORAL_TABLET | ORAL | 0 refills | Status: DC

## 2023-01-24 ENCOUNTER — Ambulatory Visit: Payer: 59 | Admitting: Obstetrics and Gynecology

## 2023-01-29 DIAGNOSIS — G4733 Obstructive sleep apnea (adult) (pediatric): Secondary | ICD-10-CM | POA: Diagnosis not present

## 2023-02-15 ENCOUNTER — Ambulatory Visit (INDEPENDENT_AMBULATORY_CARE_PROVIDER_SITE_OTHER): Payer: 59 | Admitting: *Deleted

## 2023-02-15 DIAGNOSIS — E538 Deficiency of other specified B group vitamins: Secondary | ICD-10-CM

## 2023-02-15 NOTE — Progress Notes (Signed)
Vitamin b12 injection given and patient tolerated well.  

## 2023-02-18 ENCOUNTER — Other Ambulatory Visit: Payer: Self-pay | Admitting: Obstetrics and Gynecology

## 2023-02-18 DIAGNOSIS — N950 Postmenopausal bleeding: Secondary | ICD-10-CM

## 2023-02-20 NOTE — Telephone Encounter (Signed)
Provera was increased to  after EMB results showed benign inactive endometrium performed for ongoing bleeding while on  of provera. Pt is scheduled for sonohyst eval on 03/15/2023. OK for one more month of rx until seen? Please advise. Rx pend.  Last fill 01/22/2023 for #60 (BID) Pharmacy requesting 90day rx (may not be appropriate, please advise)

## 2023-03-02 NOTE — Progress Notes (Signed)
GYNECOLOGY  VISIT   HPI: 52 y.o.   Single  African American  female   [id number] with No LMP recorded. Patient is perimenopausal.   here for  sonohysterogram.  Having postmenopausal bleeding on Provera, used to treat proliferative endometrium.  Had hysteroscopy, dilation and curettage and resection of endometrial polyp 07/11/22.  Final pathology 07/11/22 showed inactive endometrium and benign endometrial polyp.  No atypia or malignancy were seen.   Her pre-surgical endometrial biopsy showed proliferative endometrium on 05/11/22.    Being followed with endometrial biopsies.  EMB on 10/25/22 showed benign endocervical tissue and no endometrium.  EMB 01/17/23 - inactive endometrium.  She had bleeding noted at the time of her endometrial biopsy.   Provera dose was 10 mg daily to treat the proliferative endometrium. This was increased to 20 mg daily, since 01/17/23 visit.    Since then she has had no missed dosages but has taken some dosages later than usual.  Currently having light vaginal bleeding which started 6 days ago.   GYNECOLOGIC HISTORY: No LMP recorded. Patient is perimenopausal. Contraception:  perimenopause Menopausal hormone therapy:  n/a Last mammogram:  08/16/22 Breast Density A, BI-RADS CAT 1 neg Last pap smear:   03/13/22 neg: HR HPV neg        OB History     Gravida  0   Para  0   Term  0   Preterm  0   AB  0   Living  0      SAB  0   IAB  0   Ectopic  0   Multiple  0   Live Births                 Patient Active Problem List   Diagnosis Date Noted   Neck pain 02/02/2022   GAD (generalized anxiety disorder) 12/16/2021   Hypertension 09/10/2020   GERD without esophagitis 05/02/2019   BMI 45.0-49.9, adult (HCC) 05/02/2019   Low serum vitamin B12 11/17/2016    Past Medical History:  Diagnosis Date   Anemia    Arthritis    neck, right foot, left shoulder   Balance disorder    06-27-2022  per pt at times "feels off balance without dizziness" ,  stated has had neurology/ cardiology/ and ENT work-up without etiology   Endometrial polyp    GERD (gastroesophageal reflux disease)    Hypertension    MVP (mitral valve prolapse)    no evidence per echo in epic 10-21-2020, has trivial mitral valve regurg   OSA on CPAP 10/2021   followed by neurologist--- dr Frances Furbish;  sleep study in epic 10-26-2021 severe osa ( 06-27-2022 per pt uses apap nightly)   PMB (postmenopausal bleeding)    Pre-diabetes    PVC's (premature ventricular contractions)    followed by cardiologist--- dr branch;  last event monitor 11-11-2020  frequent PVCs (6%), NSR w/ rare SVT;   low risk nuclear study 10-21-2020 w/ no ischemia   Vitamin B 12 deficiency    Vitamin D deficiency 2022   Wears glasses     Past Surgical History:  Procedure Laterality Date   DILATATION & CURETTAGE/HYSTEROSCOPY WITH MYOSURE N/A 07/11/2022   Procedure: DILATATION & CURETTAGE/HYSTEROSCOPY WITH MYOSURE RESECTION OF ENDOMETRIAL POLYPS;  Surgeon: Patton Salles, MD;  Location: [facility];  Service: Gynecology;  Laterality: N/A;   NO PAST SURGERIES      Current Outpatient Medications  Medication Sig Dispense Refill   acetaminophen (TYLENOL) 650  MG CR tablet Take 1,300 mg by mouth daily.     amLODipine (NORVASC) 2.5 MG tablet Take 1 tablet (2.5 mg total) by mouth daily. 90 tablet 1   Carboxymethylcellul-Glycerin (CLEAR EYES FOR DRY EYES OP) Place 1-2 drops into both eyes daily as needed (dry eyes).     Cholecalciferol (VITAMIN D3) 50 MCG (2000 UT) TABS Take 1 tablet by mouth daily after lunch.     clindamycin (CLEOCIN-T) 1 % external solution Apply topically 2 (two) times daily. (Patient taking differently: Apply topically 2 (two) times daily as needed.) 30 mL 1   Cyanocobalamin (B-12 COMPLIANCE INJECTION IJ) Inject as directed every 30 (thirty) days.     escitalopram (LEXAPRO) 10 MG tablet Take 1 tablet (10 mg total) by mouth daily. 90 tablet 1   fexofenadine (ALLEGRA)  180 MG tablet TAKE 1 TABLET BY MOUTH EVERY DAY 90 tablet 1   fluticasone (FLONASE) 50 MCG/ACT nasal spray SPRAY 2 SPRAYS INTO EACH NOSTRIL EVERY DAY (Patient taking differently: Place 2 sprays into both nostrils daily as needed for allergies.) 48 mL 1   hydrochlorothiazide (HYDRODIURIL) 25 MG tablet Take 1 tablet (25 mg total) by mouth daily. 90 tablet 1   ibuprofen (ADVIL) 600 MG tablet Take 1 tablet (600 mg total) by mouth every 6 (six) hours as needed. 30 tablet 0   KRILL OIL PO Take 350 mg by mouth daily.      metoprolol tartrate (LOPRESSOR) 25 MG tablet TAKE 1/2 TABLET (12.5 MG TOTAL) BY MOUTH 2 (TWO) TIMES DAILY AS NEEDED (PALPITATIONS). 90 tablet 1   norethindrone (AYGESTIN) 5 MG tablet Take 1 tablet (5 mg total) by mouth daily. 30 tablet 1   nystatin (MYCOSTATIN/NYSTOP) powder Apply 1 Application topically 3 (three) times daily. Apply to affected area for up to 7 days 30 g 2   omeprazole (PRILOSEC) 20 MG capsule Take 1 capsule (20 mg total) by mouth daily. 90 capsule 1   Probiotic Product (PROBIOTIC PO) Take by mouth daily.     rosuvastatin (CRESTOR) 10 MG tablet Take 1 tablet (10 mg total) by mouth daily. 90 tablet 1   Current Facility-Administered Medications  Medication Dose Route Frequency Provider Last Rate Last Admin   cyanocobalamin (VITAMIN B12) injection 1,000 mcg  1,000 mcg Intramuscular Q30 days Bennie Pierini, FNP   1,000 mcg at 02/15/23 0840     ALLERGIES: Egg-derived products, Fluarix [influenza virus vaccine], Other, Peanut-containing drug products, Penicillins, Apple, Blueberry flavor, Kiwi extract, Watermelon flavor, Amoxicillin, and Sulfa antibiotics  Family History  Problem Relation Age of Onset   Hypertension Mother    Diabetes Mother    Other Mother        renal disease   Hypertension Father    Heart attack Father    Hypertension Sister    Diabetes Brother    Diabetes Brother    Sleep apnea Neg Hx     Social History   Socioeconomic History    Marital status: Single    Spouse name: Not on file   Number of children: Not on file   Years of education: Not on file   Highest education level: 12th grade  Occupational History   Not on file  Tobacco Use   Smoking status: Never   Smokeless tobacco: Never  Vaping Use   Vaping Use: Never used  Substance and Sexual Activity   Alcohol use: No    Alcohol/week: 0.0 standard drinks of alcohol   Drug use: Never   Sexual activity: Not Currently  Partners: Male    Birth control/protection: Abstinence  Other Topics Concern   Not on file  Social History Narrative   Not on file   Social Determinants of Health   Financial Resource Strain: Low Risk  (03/10/2023)   Overall Financial Resource Strain (CARDIA)    Difficulty of Paying Living Expenses: Not hard at all  Food Insecurity: No Food Insecurity (03/10/2023)   Hunger Vital Sign    Worried About Running Out of Food in the Last Year: Never true    Ran Out of Food in the Last Year: Never true  Transportation Needs: No Transportation Needs (03/10/2023)   PRAPARE - Administrator, Civil Service (Medical): No    Lack of Transportation (Non-Medical): No  Physical Activity: Unknown (03/10/2023)   Exercise Vital Sign    Days of Exercise per Week: 0 days    Minutes of Exercise per Session: Not on file  Stress: Stress Concern Present (03/10/2023)   [employer] of Occupational Health - Occupational Stress Questionnaire    Feeling of Stress : To some extent  Social Connections: Socially Isolated (03/10/2023)   Social Connection and Isolation Panel [NHANES]    Frequency of Communication with Friends and Family: More than three times a week    Frequency of Social Gatherings with Friends and Family: Once a week    Attends Religious Services: Never    Database administrator or Organizations: No    Attends Engineer, structural: Not on file    Marital Status: Never married  Intimate Partner Violence: Not on file    Review of  Systems  See HPI.  PHYSICAL EXAMINATION:    BP 130/82 (BP Location: Left Arm, Patient Position: Sitting, Cuff Size: Large)   Pulse 61   Ht 5\' 6"  (1.676 m)   Wt 293 lb (132.9 kg)   SpO2 96%   BMI 47.29 kg/m     General appearance: alert, cooperative and appears stated age  Pelvic US Uterus 7.81 x 4.66 x 4.39 cm.  No myometrial masses. EMS 2.90 mm.  Left ovary 1.91 x 1.29 x 1.31 cm.  Right ovary 2.22 x 1.68 x 1.50 cm.  No adnexal masses.  No free fluid.   Sonohysterogram Consent done.  Hibiclens prep.  NS injected in uterine cavity with sterile cannula.  No filling defects noted.  Minimal EBL.  No complications.   Chaperone was present for exam:  Stephan Minister, [place] tech.  ASSESSMENT  Recurrent postmenopausal bleeding.  On Provera 20 mg daily.  Hx prior endometrial polyp.  Pelvic US showing no intracavitary defects today.   PLAN  Stop Provera 20 mg daily. Start Aygestin 5 mg daily.  We discussed potential hysterectomy if bleeding persists.  Fu for annual exam and recheck in 6 weeks.    20 min  total time was spent for this patient encounter, including preparation, face-to-face counseling with the patient, coordination of care, and documentation of the encounter in addition to doing the sonohysterogram.

## 2023-03-12 ENCOUNTER — Encounter: Payer: Self-pay | Admitting: Nurse Practitioner

## 2023-03-12 ENCOUNTER — Ambulatory Visit (INDEPENDENT_AMBULATORY_CARE_PROVIDER_SITE_OTHER): Payer: Medicare HMO | Admitting: Nurse Practitioner

## 2023-03-12 VITALS — BP 123/77 | HR 60 | Temp 97.3°F | Resp 20 | Ht 66.0 in | Wt 293.0 lb

## 2023-03-12 DIAGNOSIS — F411 Generalized anxiety disorder: Secondary | ICD-10-CM | POA: Diagnosis not present

## 2023-03-12 DIAGNOSIS — R6889 Other general symptoms and signs: Secondary | ICD-10-CM | POA: Diagnosis not present

## 2023-03-12 DIAGNOSIS — N950 Postmenopausal bleeding: Secondary | ICD-10-CM

## 2023-03-12 DIAGNOSIS — K219 Gastro-esophageal reflux disease without esophagitis: Secondary | ICD-10-CM | POA: Diagnosis not present

## 2023-03-12 DIAGNOSIS — Z6841 Body Mass Index (BMI) 40.0 and over, adult: Secondary | ICD-10-CM | POA: Diagnosis not present

## 2023-03-12 DIAGNOSIS — Z23 Encounter for immunization: Secondary | ICD-10-CM

## 2023-03-12 DIAGNOSIS — E7800 Pure hypercholesterolemia, unspecified: Secondary | ICD-10-CM | POA: Diagnosis not present

## 2023-03-12 DIAGNOSIS — I10 Essential (primary) hypertension: Secondary | ICD-10-CM | POA: Diagnosis not present

## 2023-03-12 DIAGNOSIS — E538 Deficiency of other specified B group vitamins: Secondary | ICD-10-CM

## 2023-03-12 MED ORDER — ESCITALOPRAM OXALATE 10 MG PO TABS: 10.0000 mg | ORAL_TABLET | Freq: Every day | ORAL | 1 refills | Status: DC

## 2023-03-12 MED ORDER — HYDROCHLOROTHIAZIDE 25 MG PO TABS: 25.0000 mg | ORAL_TABLET | Freq: Every day | ORAL | 1 refills | Status: DC

## 2023-03-12 MED ORDER — ROSUVASTATIN CALCIUM 10 MG PO TABS: 10.0000 mg | ORAL_TABLET | Freq: Every day | ORAL | 1 refills | Status: DC

## 2023-03-12 MED ORDER — OMEPRAZOLE 20 MG PO CPDR: 20.0000 mg | DELAYED_RELEASE_CAPSULE | Freq: Every day | ORAL | 1 refills | Status: DC

## 2023-03-12 MED ORDER — MEDROXYPROGESTERONE ACETATE 10 MG PO TABS: ORAL_TABLET | ORAL | 5 refills | Status: DC

## 2023-03-12 MED ORDER — AMLODIPINE BESYLATE 2.5 MG PO TABS: 2.5000 mg | ORAL_TABLET | Freq: Every day | ORAL | 1 refills | Status: DC

## 2023-03-12 MED ORDER — METOPROLOL TARTRATE 25 MG PO TABS: ORAL_TABLET | ORAL | 1 refills | Status: DC

## 2023-03-12 NOTE — Patient Instructions (Signed)

## 2023-03-12 NOTE — Progress Notes (Signed)
Subjective:    Patient ID: Renee Thomas, female    DOB: 12/27/1970, 52 y.o.   MRN: [id number]   Chief Complaint: medical management of chronic issues     HPI:  Renee Thomas is a 52 y.o. who identifies as a female who was assigned female at birth.   Social history: Lives with: sister Work history: [employer]   Comes in today for follow up of the following chronic medical issues:  1. Primary hypertension No c./o chest pain, sob or headache. Does not check blodpressure at home. BP Readings from Last 3 Encounters:  01/17/23 126/84  01/11/23 99/60  10/25/22 124/82     2. GERD without esophagitis Is on omeprazole daily to prevent symptoms.  3. GAD (generalized anxiety disorder) Is on lexapro and is doing well.    03/12/2023    8:25 AM 09/11/2022    8:34 AM 03/15/2022    9:18 AM 03/10/2022    9:24 AM  GAD 7 : Generalized Anxiety Score  Nervous, Anxious, on Edge 1 1 1 1   Control/stop worrying 1 1 1 1   Worry too much - different things 0 0 1 1  Trouble relaxing 0 1 0 0  Restless 0 0 1 0  Easily annoyed or irritable 0 0 0 1  Afraid - awful might happen 0 0 0 1  Total GAD 7 Score 2 3 4 5   Anxiety Difficulty Not difficult at all Not difficult at all Somewhat difficult Not difficult at all      4. Low serum vitamin B12 Is on b12 supplements Lab Results  Component Value Date   VITAMINB12 1,189 09/10/2020    5. BMI 45.0-49.9, adult (HCC) No recent weight changes Wt Readings from Last 3 Encounters:  03/12/23 293 lb (132.9 kg)  01/17/23 292 lb (132.5 kg)  01/11/23 294 lb (133.4 kg)   BMI Readings from Last 3 Encounters:  03/12/23 47.29 kg/m  01/17/23 47.13 kg/m  01/11/23 48.92 kg/m     New complaints: Still having vertigo that affects  her everyday functioning. Is intermittent and occurs several times a month and can last longer then a day.   Allergies  Allergen Reactions   Egg-Derived Products Swelling    Swells mouth with itching   Fluarix  [Influenza Virus Vaccine] Swelling    Contains egg protein.   Other Anaphylaxis    All Tree-Nuts   Peanut-Containing Drug Products Anaphylaxis   Penicillins Rash and Anaphylaxis    .Has patient had a PCN reaction causing immediate rash, facial/tongue/throat swelling, SOB or lightheadedness with hypotension: Yes Has patient had a PCN reaction causing severe rash involving mucus membranes or skin necrosis: No Has patient had a PCN reaction that required hospitalization: no Has patient had a PCN reaction occurring within the last 10 years: no If all of the above answers are "NO", then may proceed with Cephalosporin use.  Other reaction(s): Other (See Comments) .Has patient had a PCN reaction causing immediate rash, facial/tongue/throat swelling, SOB or lightheadedness with hypotension: Yes Has patient had a PCN reaction causing severe rash involving mucus membranes or skin necrosis: No Has patient had a PCN reaction that required hospitalization: no Has patient had a PCN reaction occurring within the last 10 years: no If all of the above answers are "NO", then may proceed with Cephalosporin use.   Apple Itching    Mouth itching   Blueberry Flavor Itching   Kiwi Extract Itching    Per pt tongue and throat  itching/ burning   Watermelon Flavor Itching    And Canteloupe   Amoxicillin Rash   Sulfa Antibiotics Rash    numbness   Outpatient Encounter Medications as of 03/12/2023  Medication Sig   acetaminophen (TYLENOL) 650 MG CR tablet Take 1,300 mg by mouth daily.   amLODipine (NORVASC) 2.5 MG tablet Take 1 tablet (2.5 mg total) by mouth daily.   Carboxymethylcellul-Glycerin (CLEAR EYES FOR DRY EYES OP) Place 1-2 drops into both eyes daily as needed (dry eyes).   Cholecalciferol (VITAMIN D3) 50 MCG (2000 UT) TABS Take 1 tablet by mouth daily after lunch.   clindamycin (CLEOCIN-T) 1 % external solution Apply topically 2 (two) times daily. (Patient taking differently: Apply topically 2 (two)  times daily as needed.)   Cyanocobalamin (B-12 COMPLIANCE INJECTION IJ) Inject as directed every 30 (thirty) days.   escitalopram (LEXAPRO) 10 MG tablet Take 1 tablet (10 mg total) by mouth daily.   fexofenadine (ALLEGRA) 180 MG tablet TAKE 1 TABLET BY MOUTH EVERY DAY   fluticasone (FLONASE) 50 MCG/ACT nasal spray SPRAY 2 SPRAYS INTO EACH NOSTRIL EVERY DAY (Patient taking differently: Place 2 sprays into both nostrils daily as needed for allergies.)   hydrochlorothiazide (HYDRODIURIL) 25 MG tablet Take 1 tablet (25 mg total) by mouth daily.   ibuprofen (ADVIL) 600 MG tablet Take 1 tablet (600 mg total) by mouth every 6 (six) hours as needed.   KRILL OIL PO Take 350 mg by mouth daily.    medroxyPROGESTERone (PROVERA) 10 MG tablet TAKE 2 TABLETS BY MOUTH EVERY DAY   metoprolol tartrate (LOPRESSOR) 25 MG tablet TAKE 1/2 TABLET (12.5 MG TOTAL) BY MOUTH 2 (TWO) TIMES DAILY AS NEEDED (PALPITATIONS).   nystatin (MYCOSTATIN/NYSTOP) powder Apply 1 Application topically 3 (three) times daily. Apply to affected area for up to 7 days   omeprazole (PRILOSEC) 20 MG capsule Take 1 capsule (20 mg total) by mouth daily.   Probiotic Product (PROBIOTIC PO) Take by mouth daily.   rosuvastatin (CRESTOR) 10 MG tablet Take 1 tablet (10 mg total) by mouth daily.   Facility-Administered Encounter Medications as of 03/12/2023  Medication   cyanocobalamin (VITAMIN B12) injection 1,000 mcg    Past Surgical History:  Procedure Laterality Date   DILATATION & CURETTAGE/HYSTEROSCOPY WITH MYOSURE N/A 07/11/2022   Procedure: DILATATION & CURETTAGE/HYSTEROSCOPY WITH MYOSURE RESECTION OF ENDOMETRIAL POLYPS;  Surgeon: Patton Salles, MD;  Location: [facility];  Service: Gynecology;  Laterality: N/A;   NO PAST SURGERIES      Family History  Problem Relation Age of Onset   Hypertension Mother    Diabetes Mother    Other Mother        renal disease   Hypertension Father    Heart attack Father     Hypertension Sister    Diabetes Brother    Diabetes Brother    Sleep apnea Neg Hx       Controlled substance contract: n/a     Review of Systems  Constitutional:  Negative for diaphoresis.  Eyes:  Negative for pain.  Respiratory:  Negative for shortness of breath.   Cardiovascular:  Negative for chest pain, palpitations and leg swelling.  Gastrointestinal:  Negative for abdominal pain.  Endocrine: Negative for polydipsia.  Skin:  Negative for rash.  Neurological:  Negative for dizziness, weakness and headaches.  Hematological:  Does not bruise/bleed easily.  All other systems reviewed and are negative.      Objective:   Physical Exam Vitals and  nursing note reviewed.  Constitutional:      General: She is not in acute distress.    Appearance: Normal appearance. She is well-developed.  HENT:     Head: Normocephalic.     Right Ear: Tympanic membrane normal.     Left Ear: Tympanic membrane normal.     Nose: Nose normal.     Mouth/Throat:     Mouth: Mucous membranes are moist.  Eyes:     Pupils: Pupils are equal, round, and reactive to light.  Neck:     Vascular: No carotid bruit or JVD.  Cardiovascular:     Rate and Rhythm: Normal rate and regular rhythm.     Heart sounds: Normal heart sounds.  Pulmonary:     Effort: Pulmonary effort is normal. No respiratory distress.     Breath sounds: Normal breath sounds. No wheezing or rales.  Chest:     Chest wall: No tenderness.  Abdominal:     General: Bowel sounds are normal. There is no distension or abdominal bruit.     Palpations: Abdomen is soft. There is no hepatomegaly, splenomegaly, mass or pulsatile mass.     Tenderness: There is no abdominal tenderness.  Musculoskeletal:        General: Normal range of motion.     Cervical back: Normal range of motion and neck supple.  Lymphadenopathy:     Cervical: No cervical adenopathy.  Skin:    General: Skin is warm and dry.  Neurological:     Mental Status: She is  alert and oriented to person, place, and time.     Deep Tendon Reflexes: Reflexes are normal and symmetric.  Psychiatric:        Behavior: Behavior normal.        Thought Content: Thought content normal.        Judgment: Judgment normal.     BP 123/77   Pulse 60   Temp (!) 97.3 F (36.3 C) (Temporal)   Resp 20   Ht 5\' 6"  (1.676 m)   Wt 293 lb (132.9 kg)   SpO2 98%   BMI 47.29 kg/m        Assessment & Plan:  [name] in today with chief complaint of Medical Management of Chronic Issues   1. Primary hypertension Low sodium diet - amLODipine (NORVASC) 2.5 MG tablet; Take 1 tablet (2.5 mg total) by mouth daily.  Dispense: 90 tablet; Refill: 1 - hydrochlorothiazide (HYDRODIURIL) 25 MG tablet; Take 1 tablet (25 mg total) by mouth daily.  Dispense: 90 tablet; Refill: 1 - metoprolol tartrate (LOPRESSOR) 25 MG tablet; TAKE 1/2 TABLET (12.5 MG TOTAL) BY MOUTH 2 (TWO) TIMES DAILY AS NEEDED (PALPITATIONS).  Dispense: 90 tablet; Refill: 1 - CBC with Differential/Platelet - CMP14+EGFR  2. GERD without esophagitis Avoid spicy foods Do not eat 2 hours prior to bedtime - omeprazole (PRILOSEC) 20 MG capsule; Take 1 capsule (20 mg total) by mouth daily.  Dispense: 90 capsule; Refill: 1  3. GAD (generalized anxiety disorder) Stress management - escitalopram (LEXAPRO) 10 MG tablet; Take 1 tablet (10 mg total) by mouth daily.  Dispense: 90 tablet; Refill: 1  4. Low serum vitamin B12 Continue monthly b12 injections - Vitamin B12 - VITAMIN D 25 Hydroxy (Vit-D Deficiency, Fractures)  5. BMI 45.0-49.9, adult (HCC) Discussed diet and exercise for person with BMI >25 Will recheck weight in 3-6 months   6. Postmenopausal bleeding Report any bleeding issues - medroxyPROGESTERone (PROVERA) 10 MG tablet; TAKE 2 TABLETS BY  MOUTH EVERY DAY  Dispense: 60 tablet; Refill: 5  7. Pure hypercholesterolemia Low fat diet - rosuvastatin (CRESTOR) 10 MG tablet; Take 1 tablet (10 mg total) by  mouth daily.  Dispense: 90 tablet; Refill: 1 - Lipid panel    The above assessment and management plan was discussed with the patient. The patient verbalized understanding of and has agreed to the management plan. Patient is aware to call the clinic if symptoms persist or worsen. Patient is aware when to return to the clinic for a follow-up visit. Patient educated on when it is appropriate to go to the emergency department.   [name] Daphine Deutscher, FNP

## 2023-03-12 NOTE — Addendum Note (Signed)
Addended by: Bennie Pierini on: 03/12/2023 08:58 AM   Modules accepted: Level of Service

## 2023-03-13 ENCOUNTER — Other Ambulatory Visit: Payer: Self-pay | Admitting: Obstetrics and Gynecology

## 2023-03-13 DIAGNOSIS — N950 Postmenopausal bleeding: Secondary | ICD-10-CM

## 2023-03-13 LAB — CBC WITH DIFFERENTIAL/PLATELET
Basophils Absolute: 0 10*3/uL (ref 0.0–0.2)
Basos: 1 %
EOS (ABSOLUTE): 0.1 10*3/uL (ref 0.0–0.4)
Eos: 2 %
Hematocrit: 40 % (ref 34.0–46.6)
Hemoglobin: 12.8 g/dL (ref 11.1–15.9)
Immature Grans (Abs): 0 10*3/uL (ref 0.0–0.1)
Immature Granulocytes: 0 %
Lymphocytes Absolute: 2 10*3/uL (ref 0.7–3.1)
Lymphs: 42 %
MCH: 27.8 pg (ref 26.6–33.0)
MCHC: 32 g/dL (ref 31.5–35.7)
MCV: 87 fL (ref 79–97)
Monocytes Absolute: 0.4 10*3/uL (ref 0.1–0.9)
Monocytes: 8 %
Neutrophils Absolute: 2.3 10*3/uL (ref 1.4–7.0)
Neutrophils: 47 %
Platelets: 361 10*3/uL (ref 150–450)
RBC: 4.61 x10E6/uL (ref 3.77–5.28)
RDW: 14.3 % (ref 11.7–15.4)
WBC: 4.8 10*3/uL (ref 3.4–10.8)

## 2023-03-13 LAB — CMP14+EGFR
ALT: 13 IU/L (ref 0–32)
AST: 14 IU/L (ref 0–40)
Albumin/Globulin Ratio: 1.4 (ref 1.2–2.2)
Albumin: 4.5 g/dL (ref 3.8–4.9)
Alkaline Phosphatase: 123 IU/L — ABNORMAL HIGH (ref 44–121)
BUN/Creatinine Ratio: 22 (ref 9–23)
BUN: 20 mg/dL (ref 6–24)
Bilirubin Total: 0.6 mg/dL (ref 0.0–1.2)
CO2: 19 mmol/L — ABNORMAL LOW (ref 20–29)
Calcium: 10 mg/dL (ref 8.7–10.2)
Chloride: 102 mmol/L (ref 96–106)
Creatinine, Ser: 0.91 mg/dL (ref 0.57–1.00)
Globulin, Total: 3.2 g/dL (ref 1.5–4.5)
Glucose: 89 mg/dL (ref 70–99)
Potassium: 4.2 mmol/L (ref 3.5–5.2)
Sodium: 139 mmol/L (ref 134–144)
Total Protein: 7.7 g/dL (ref 6.0–8.5)
eGFR: 75 mL/min/{1.73_m2} (ref >59)

## 2023-03-13 LAB — LIPID PANEL
Chol/HDL Ratio: 2.9 ratio (ref 0.0–4.4)
Cholesterol, Total: 119 mg/dL (ref 100–199)
HDL: 41 mg/dL (ref >39)
LDL Chol Calc (NIH): 64 mg/dL (ref 0–99)
Triglycerides: 67 mg/dL (ref 0–149)
VLDL Cholesterol Cal: 14 mg/dL (ref 5–40)

## 2023-03-13 LAB — VITAMIN B12: Vitamin B-12: 1087 pg/mL (ref 232–1245)

## 2023-03-13 LAB — VITAMIN D 25 HYDROXY (VIT D DEFICIENCY, FRACTURES): Vit D, 25-Hydroxy: 51.4 ng/mL (ref 30.0–100.0)

## 2023-03-13 NOTE — Addendum Note (Signed)
Addended by: Cleda Daub on: 03/13/2023 09:31 AM   Modules accepted: Orders

## 2023-03-15 ENCOUNTER — Ambulatory Visit: Payer: Medicare HMO

## 2023-03-15 ENCOUNTER — Encounter: Payer: Self-pay | Admitting: Obstetrics and Gynecology

## 2023-03-15 ENCOUNTER — Ambulatory Visit: Payer: Medicare HMO | Admitting: Obstetrics and Gynecology

## 2023-03-15 VITALS — BP 130/82 | HR 61 | Ht 66.0 in | Wt 293.0 lb

## 2023-03-15 DIAGNOSIS — N950 Postmenopausal bleeding: Secondary | ICD-10-CM | POA: Diagnosis not present

## 2023-03-15 MED ORDER — NORETHINDRONE ACETATE 5 MG PO TABS: 5.0000 mg | ORAL_TABLET | Freq: Every day | ORAL | 1 refills | Status: DC

## 2023-03-15 NOTE — Patient Instructions (Signed)
Norethindrone Acetate Tablets (Hormone Replacement Therapy) What is this medication? NORETHINDRONE ACETATE (nor eth IN drone AS e tate) treats irregular menstrual cycles. It may also be used to treat pain from endometriosis. It works by increasing levels of the hormone progesterone in your body. This medication is a progestin hormone. This medicine may be used for other purposes; ask your health care provider or pharmacist if you have questions. COMMON BRAND NAME(S): Aygestin What should I tell my care team before I take this medication? They need to know if you have any of these conditions: Blood vessel disease or blood clots Breast, cervical, or vaginal cancer Depression Diabetes Heart disease Kidney disease Liver disease Migraine Seizures Stroke Vaginal bleeding An unusual or allergic reaction to norethindrone, other medications, foods, dyes, or preservatives Pregnant or trying to get pregnant Breast-feeding How should I use this medication? Take this medication by mouth with a glass of water. You may take this medication with or without food. Follow the directions on the prescription label. Take this medication at the same time each day. Do not take your medication more often than directed. A patient information sheet will be given with each prescription and refill. Read this sheet carefully each time. The sheet may change frequently. Talk to your care team about the use of this medication in children. Special care may be needed. Overdosage: If you think you have taken too much of this medicine contact a poison control center or emergency room at once. NOTE: This medicine is only for you. Do not share this medicine with others. What if I miss a dose? If you miss a dose, take it as soon as you can. If it is almost time for your next dose, take only that dose. Do not take double or extra doses. What may interact with this medication? Do not take this medication with any of the  following: Amprenavir or fosamprenavir Bosentan This medication may also interact with the following: Antibiotics or medications for infections, such as rifampin, rifabutin, rifapentine, and griseofulvin Aprepitant Barbiturate medications, such as phenobarbital Carbamazepine Felbamate Modafinil Oxcarbazepine Phenytoin Ritonavir or other medications for HIV infection or AIDS St. John's Wort Topiramate This list may not describe all possible interactions. Give your health care provider a list of all the medicines, herbs, non-prescription drugs, or dietary supplements you use. Also tell them if you smoke, drink alcohol, or use illegal drugs. Some items may interact with your medicine. What should I watch for while using this medication? Visit your care team for regular checks on your progress. You will need a regular breast and pelvic exam and Pap smear while on this medication. If you have any reason to think you are pregnant, stop taking this medication right away and contact your care team. If you are taking this medication for hormone-related problems, it may take several cycles of use to see improvement in your condition. What side effects may I notice from receiving this medication? Side effects that you should report to your care team as soon as possible: Allergic reactions--skin rash, itching, hives, swelling of the face, lips, tongue, or throat Blood clot--pain, swelling, or warmth in the leg, shortness of breath, chest pain Gallbladder problems--severe stomach pain, nausea, vomiting, fever Increase in blood pressure Liver injury--right upper belly pain, loss of appetite, nausea, light-colored stool, dark yellow or brown urine, yellowing skin or eyes, unusual weakness or fatigue New or worsening migraines or headaches Stroke--sudden numbness or weakness of the face, arm, or leg, trouble speaking, confusion, trouble   walking, loss of balance or coordination, dizziness, severe  headache, change in vision Unusual vaginal discharge, itching, or odor Worsening mood, feelings of depression Side effects that usually do not require medical attention (report to your care team if they continue or are bothersome): Breast pain or tenderness Dark patches of skin on the face or other sun-exposed areas Hair loss Irregular menstrual cycles or spotting Nausea Stomach pain This list may not describe all possible side effects. Call your doctor for medical advice about side effects. You may report side effects to FDA at 1-800-FDA-1088. Where should I keep my medication? Keep out of the reach of children and pets. Store at room temperature between 15 and 30 degrees C (59 and 86 degrees F). Throw away any unused medication after the expiration date. NOTE: This sheet is a summary. It may not cover all possible information. If you have questions about this medicine, talk to your doctor, pharmacist, or health care provider.  2023 Elsevier/Gold Standard (2021-09-14 00:00:00)  

## 2023-03-19 ENCOUNTER — Ambulatory Visit: Payer: Medicare HMO

## 2023-03-19 ENCOUNTER — Ambulatory Visit (INDEPENDENT_AMBULATORY_CARE_PROVIDER_SITE_OTHER): Payer: Medicare HMO

## 2023-03-19 DIAGNOSIS — E538 Deficiency of other specified B group vitamins: Secondary | ICD-10-CM | POA: Diagnosis not present

## 2023-03-19 NOTE — Progress Notes (Signed)
Cyanocobalamin injection given to right deltoid.  Patient tolerated well. 

## 2023-04-07 ENCOUNTER — Other Ambulatory Visit: Payer: Self-pay | Admitting: Obstetrics and Gynecology

## 2023-04-12 DIAGNOSIS — Z1211 Encounter for screening for malignant neoplasm of colon: Secondary | ICD-10-CM | POA: Diagnosis not present

## 2023-04-12 DIAGNOSIS — K219 Gastro-esophageal reflux disease without esophagitis: Secondary | ICD-10-CM | POA: Diagnosis not present

## 2023-04-12 DIAGNOSIS — Z86010 Personal history of colonic polyps: Secondary | ICD-10-CM | POA: Diagnosis not present

## 2023-04-12 DIAGNOSIS — R635 Abnormal weight gain: Secondary | ICD-10-CM | POA: Diagnosis not present

## 2023-04-18 ENCOUNTER — Ambulatory Visit: Payer: 59 | Admitting: Obstetrics and Gynecology

## 2023-04-18 NOTE — Progress Notes (Deleted)
52 y.o. G0P0000 Single African American female here for annual exam.    PCP:     No LMP recorded. Patient is perimenopausal.           Sexually active: {yes no:314532}  The current method of family planning is post menopausal status.    Exercising: {yes no:314532}  {types:19826} Smoker:  no  Health Maintenance: Pap:  03/23/22 neg: HR HPV neg, 09-26-2019 negative            08-25-16 negative, HR HPV negative History of abnormal Pap:  no MMG:  08/16/22 Breast Density Cat A, BI-RADS CAT 1 neg Colonoscopy:  05/04/18 polyps, 5 yr f/u BMD:   n/a  Result  n/a TDaP:  2014 Gardasil:   no HIV: 07/09/15 NR Hep C: 08/25/16 neg Screening Labs:  Hb today: ***, Urine today: ***   reports that she has never smoked. She has never used smokeless tobacco. She reports that she does not drink alcohol and does not use drugs.  Past Medical History:  Diagnosis Date   Anemia    Arthritis    neck, right foot, left shoulder   Balance disorder    06-27-2022  per pt at times "feels off balance without dizziness" , stated has had neurology/ cardiology/ and ENT work-up without etiology   Endometrial polyp    GERD (gastroesophageal reflux disease)    Hypertension    MVP (mitral valve prolapse)    no evidence per echo in epic 10-21-2020, has trivial mitral valve regurg   OSA on CPAP 10/2021   followed by neurologist--- dr Frances Furbish;  sleep study in epic 10-26-2021 severe osa ( 06-27-2022 per pt uses apap nightly)   PMB (postmenopausal bleeding)    Pre-diabetes    PVC's (premature ventricular contractions)    followed by cardiologist--- dr branch;  last event monitor 11-11-2020  frequent PVCs (6%), NSR w/ rare SVT;   low risk nuclear study 10-21-2020 w/ no ischemia   Vitamin B 12 deficiency    Vitamin D deficiency 2022   Wears glasses     Past Surgical History:  Procedure Laterality Date   DILATATION & CURETTAGE/HYSTEROSCOPY WITH MYOSURE N/A 07/11/2022   Procedure: DILATATION & CURETTAGE/HYSTEROSCOPY WITH  MYOSURE RESECTION OF ENDOMETRIAL POLYPS;  Surgeon: Patton Salles, MD;  Location: [facility];  Service: Gynecology;  Laterality: N/A;   NO PAST SURGERIES      Current Outpatient Medications  Medication Sig Dispense Refill   acetaminophen (TYLENOL) 650 MG CR tablet Take 1,300 mg by mouth daily.     amLODipine (NORVASC) 2.5 MG tablet Take 1 tablet (2.5 mg total) by mouth daily. 90 tablet 1   Carboxymethylcellul-Glycerin (CLEAR EYES FOR DRY EYES OP) Place 1-2 drops into both eyes daily as needed (dry eyes).     Cholecalciferol (VITAMIN D3) 50 MCG (2000 UT) TABS Take 1 tablet by mouth daily after lunch.     clindamycin (CLEOCIN-T) 1 % external solution Apply topically 2 (two) times daily. (Patient taking differently: Apply topically 2 (two) times daily as needed.) 30 mL 1   Cyanocobalamin (B-12 COMPLIANCE INJECTION IJ) Inject as directed every 30 (thirty) days.     escitalopram (LEXAPRO) 10 MG tablet Take 1 tablet (10 mg total) by mouth daily. 90 tablet 1   fexofenadine (ALLEGRA) 180 MG tablet TAKE 1 TABLET BY MOUTH EVERY DAY 90 tablet 1   fluticasone (FLONASE) 50 MCG/ACT nasal spray SPRAY 2 SPRAYS INTO EACH NOSTRIL EVERY DAY (Patient taking differently: Place 2  sprays into both nostrils daily as needed for allergies.) 48 mL 1   hydrochlorothiazide (HYDRODIURIL) 25 MG tablet Take 1 tablet (25 mg total) by mouth daily. 90 tablet 1   ibuprofen (ADVIL) 600 MG tablet Take 1 tablet (600 mg total) by mouth every 6 (six) hours as needed. 30 tablet 0   KRILL OIL PO Take 350 mg by mouth daily.      metoprolol tartrate (LOPRESSOR) 25 MG tablet TAKE 1/2 TABLET (12.5 MG TOTAL) BY MOUTH 2 (TWO) TIMES DAILY AS NEEDED (PALPITATIONS). 90 tablet 1   norethindrone (AYGESTIN) 5 MG tablet Take 1 tablet (5 mg total) by mouth daily. 30 tablet 1   nystatin (MYCOSTATIN/NYSTOP) powder Apply 1 Application topically 3 (three) times daily. Apply to affected area for up to 7 days 30 g 2   omeprazole  (PRILOSEC) 20 MG capsule Take 1 capsule (20 mg total) by mouth daily. 90 capsule 1   Probiotic Product (PROBIOTIC PO) Take by mouth daily.     rosuvastatin (CRESTOR) 10 MG tablet Take 1 tablet (10 mg total) by mouth daily. 90 tablet 1   Current Facility-Administered Medications  Medication Dose Route Frequency Provider Last Rate Last Admin   cyanocobalamin (VITAMIN B12) injection 1,000 mcg  1,000 mcg Intramuscular Q30 days Bennie Pierini, FNP   1,000 mcg at 03/19/23 1505    Family History  Problem Relation Age of Onset   Hypertension Mother    Diabetes Mother    Other Mother        renal disease   Hypertension Father    Heart attack Father    Hypertension Sister    Diabetes Brother    Diabetes Brother    Sleep apnea Neg Hx     Review of Systems  Exam:   There were no vitals taken for this visit.    General appearance: alert, cooperative and appears stated age Head: normocephalic, without obvious abnormality, atraumatic Neck: no adenopathy, supple, symmetrical, trachea midline and thyroid normal to inspection and palpation Lungs: clear to auscultation bilaterally Breasts: normal appearance, no masses or tenderness, No nipple retraction or dimpling, No nipple discharge or bleeding, No axillary adenopathy Heart: regular rate and rhythm Abdomen: soft, non-tender; no masses, no organomegaly Extremities: extremities normal, atraumatic, no cyanosis or edema Skin: skin color, texture, turgor normal. No rashes or lesions Lymph nodes: cervical, supraclavicular, and axillary nodes normal. Neurologic: grossly normal  Pelvic: External genitalia:  no lesions              No abnormal inguinal nodes palpated.              Urethra:  normal appearing urethra with no masses, tenderness or lesions              Bartholins and Skenes: normal                 Vagina: normal appearing vagina with normal color and discharge, no lesions              Cervix: no lesions              Pap taken:  {yes no:314532} Bimanual Exam:  Uterus:  normal size, contour, position, consistency, mobility, non-tender              Adnexa: no mass, fullness, tenderness              Rectal exam: {yes no:314532}.  Confirms.  Anus:  normal sphincter tone, no lesions  Chaperone was present for exam:  ***  Assessment:   Well woman visit with gynecologic exam.   Plan: Mammogram screening discussed. Self breast awareness reviewed. Pap and HR HPV as above. Guidelines for Calcium, Vitamin D, regular exercise program including cardiovascular and weight bearing exercise.   Follow up annually and prn.   Additional counseling given.  {yes [id number]. _______ minutes face to face time of which over 50% was spent in counseling.    After visit summary provided.

## 2023-04-19 ENCOUNTER — Ambulatory Visit: Payer: Medicare HMO

## 2023-04-23 ENCOUNTER — Ambulatory Visit: Payer: 59 | Admitting: Obstetrics and Gynecology

## 2023-04-25 ENCOUNTER — Ambulatory Visit (INDEPENDENT_AMBULATORY_CARE_PROVIDER_SITE_OTHER): Payer: Medicare HMO | Admitting: *Deleted

## 2023-04-25 DIAGNOSIS — E538 Deficiency of other specified B group vitamins: Secondary | ICD-10-CM | POA: Diagnosis not present

## 2023-04-25 NOTE — Progress Notes (Signed)
B12 injection given left deltoid intramuscular. Patient tolerated well. 

## 2023-05-01 ENCOUNTER — Telehealth: Payer: Self-pay

## 2023-05-01 ENCOUNTER — Ambulatory Visit: Payer: 59 | Admitting: Obstetrics and Gynecology

## 2023-05-01 NOTE — Telephone Encounter (Signed)
After reading Dr Rica Records note, I think she should stay on the Aygestin until she can see her. Please try and get her in for a f/u visit in the next few weeks.

## 2023-05-01 NOTE — Telephone Encounter (Signed)
BS pt calling to report being  r/s'd by office from appt w/ BS on 05/01/2023 for appt to 08/29/2023 for AEX. Pt states that Dr. Edward Jolly has been prescribing her Aygestin more recently for her PMB until her f/u appt. However, states she was on provera before in the past and feels as if she did better on provera than aygestin (still spots more). Requesting refill on provera until can be seen. Please advise.   Last mammo 08/12/2022-neg birads 1

## 2023-05-02 NOTE — Telephone Encounter (Signed)
Pt scheduled for an OV on 06/20/2023 to discuss aygestin vs provera medication mgmt for bleeding.  Pt mentioned that provera not only helped w/ the bleeding but suppressed her cramping as well. Where as with the aygestin, she is still spotting often and having menstrual like cramping.   Pt advised I will send information to Dr. Edward Jolly for her to review when she returns. She voiced understanding. States she doesn't have any refills left on aygestin rx and only has 12 tabs left. May need refill if needs to continue on it until f/u appt.

## 2023-05-07 ENCOUNTER — Other Ambulatory Visit: Payer: Self-pay | Admitting: Nurse Practitioner

## 2023-05-07 ENCOUNTER — Other Ambulatory Visit: Payer: Self-pay | Admitting: Obstetrics and Gynecology

## 2023-05-07 NOTE — Telephone Encounter (Signed)
Medication refill request: aygestin 5mg  Last AEX:  03-23-22 BS patient Next AEX: 08-29-23 Last MMG (if hormonal medication request): 08-12-22 birads 1:neg Refill authorized: please approve if appropriate

## 2023-05-18 NOTE — Telephone Encounter (Signed)
Pt notified and voiced understanding. Pt stated she still had ~a whole bottle left of the provera so didn't need an rx at this time. Advised her to make sure that it wasn't expired prior to re-starting and will have appt desk reach out to her if they find a sooner opening. Pt voiced understanding.

## 2023-05-18 NOTE — Telephone Encounter (Signed)
I recommend that the patient stop the Aygestin and return to taking Provera 20 mg daily.  I will see her for a follow up appointment and endometrial biopsy.   If she can come in sooner than 06/20/23, please move up the appointment with me.

## 2023-05-24 ENCOUNTER — Encounter: Payer: Self-pay | Admitting: Obstetrics and Gynecology

## 2023-05-24 ENCOUNTER — Ambulatory Visit (INDEPENDENT_AMBULATORY_CARE_PROVIDER_SITE_OTHER): Payer: Medicare HMO | Admitting: Obstetrics and Gynecology

## 2023-05-24 VITALS — BP 124/80 | HR 75 | Ht 65.5 in | Wt 301.0 lb

## 2023-05-24 DIAGNOSIS — Z5181 Encounter for therapeutic drug level monitoring: Secondary | ICD-10-CM | POA: Diagnosis not present

## 2023-05-24 DIAGNOSIS — N950 Postmenopausal bleeding: Secondary | ICD-10-CM | POA: Diagnosis not present

## 2023-05-24 MED ORDER — NORETHINDRONE ACETATE 5 MG PO TABS: ORAL_TABLET | ORAL | 0 refills | Status: DC

## 2023-05-24 NOTE — Progress Notes (Unsigned)
GYNECOLOGY  VISIT   HPI: 52 y.o.   Single  African American  female   G0P0000 with No LMP recorded. Patient is perimenopausal.   here for follow up on meds. Aygestin 5 mg daily helped her bleeding slow down but did not alleviate fully. Pt switched back to Provera 20 daily and had increased bleeding again. States lifelong history of abnormal uterine bleeding.   Patient has been on progesterone to treat abnormal perimenopausal bleeding and has become postmenopausal with continued bleeding.   Labs 03/16/21:  FSH 61.0, estradiol 16. Labs 03/23/22:  FSH 44.8, estradiol 26. Labs 06/14/22:  FSH 42.8, estradiol 17.  Endometrial biopsy on 05/11/22 showed benign proliferative endometrium and an endometrial polyp. She underwent a hysteroscopy with resection of the endometrial polyps 07/11/22.  Final pathology showed inactive endometrium and benign polyps.   She has been treated with progesterone therapy to control the proliferative endometrium.  Her last endometrial biopsy was performed 01/17/23 and showed benign inactive endometrium, no evidence of hyperplasia, atypia, or malignancy.   Sonohysterogram performed 03/15/23 due to ongoing bleeding, and this showed no myometrial masses, EMS 2.9 with no endometrial masses, and normal ovaries.   Sometimes has cramping and clotting but does not occur daily.   No dizziness or lightheadedness. Hgb 12.8 on 03/12/23.   Not sexually active.   GYNECOLOGIC HISTORY: No LMP recorded. Patient is perimenopausal. Contraception:  PMP Menopausal hormone therapy:  n/a (prescribed aygestin 05/07/23) Last mammogram:  08/16/22 Breast Density Cat A, BI-RADS CAT 1 neg Last pap smear:   03/23/22 neg: HR HPV neg, 1//20/20 neg        OB History     Gravida  0   Para  0   Term  0   Preterm  0   AB  0   Living  0      SAB  0   IAB  0   Ectopic  0   Multiple  0   Live Births                 Patient Active Problem List   Diagnosis Date Noted   Neck pain  02/02/2022   GAD (generalized anxiety disorder) 12/16/2021   Hypertension 09/10/2020   GERD without esophagitis 05/02/2019   BMI 45.0-49.9, adult (HCC) 05/02/2019   Low serum vitamin B12 11/17/2016    Past Medical History:  Diagnosis Date   Anemia    Arthritis    neck, right foot, left shoulder   Balance disorder    06-27-2022  per pt at times "feels off balance without dizziness" , stated has had neurology/ cardiology/ and ENT work-up without etiology   Endometrial polyp    GERD (gastroesophageal reflux disease)    Hypertension    MVP (mitral valve prolapse)    no evidence per echo in epic 10-21-2020, has trivial mitral valve regurg   OSA on CPAP 10/2021   followed by neurologist--- dr Frances Furbish;  sleep study in epic 10-26-2021 severe osa ( 06-27-2022 per pt uses apap nightly)   PMB (postmenopausal bleeding)    Pre-diabetes    PVC's (premature ventricular contractions)    followed by cardiologist--- dr branch;  last event monitor 11-11-2020  frequent PVCs (6%), NSR w/ rare SVT;   low risk nuclear study 10-21-2020 w/ no ischemia   Vitamin B 12 deficiency    Vitamin D deficiency 2022   Wears glasses     Past Surgical History:  Procedure Laterality Date   DILATATION & CURETTAGE/HYSTEROSCOPY  WITH MYOSURE N/A 07/11/2022   Procedure: DILATATION & CURETTAGE/HYSTEROSCOPY WITH MYOSURE RESECTION OF ENDOMETRIAL POLYPS;  Surgeon: Patton Salles, MD;  Location: [facility];  Service: Gynecology;  Laterality: N/A;   NO PAST SURGERIES      Current Outpatient Medications  Medication Sig Dispense Refill   acetaminophen (TYLENOL) 650 MG CR tablet Take 1,300 mg by mouth daily.     amLODipine (NORVASC) 2.5 MG tablet Take 1 tablet (2.5 mg total) by mouth daily. 90 tablet 1   Carboxymethylcellul-Glycerin (CLEAR EYES FOR DRY EYES OP) Place 1-2 drops into both eyes daily as needed (dry eyes).     Cholecalciferol (VITAMIN D3) 50 MCG (2000 UT) TABS Take 1 tablet by mouth daily  after lunch.     clindamycin (CLEOCIN-T) 1 % external solution Apply topically 2 (two) times daily. (Patient taking differently: Apply topically 2 (two) times daily as needed.) 30 mL 1   Cyanocobalamin (B-12 COMPLIANCE INJECTION IJ) Inject as directed every 30 (thirty) days.     escitalopram (LEXAPRO) 10 MG tablet Take 1 tablet (10 mg total) by mouth daily. 90 tablet 1   fexofenadine (ALLEGRA) 180 MG tablet TAKE 1 TABLET BY MOUTH EVERY DAY 90 tablet 1   fluticasone (FLONASE) 50 MCG/ACT nasal spray SPRAY 2 SPRAYS INTO EACH NOSTRIL EVERY DAY (Patient taking differently: Place 2 sprays into both nostrils daily as needed for allergies.) 48 mL 1   hydrochlorothiazide (HYDRODIURIL) 25 MG tablet Take 1 tablet (25 mg total) by mouth daily. 90 tablet 1   ibuprofen (ADVIL) 600 MG tablet Take 1 tablet (600 mg total) by mouth every 6 (six) hours as needed. 30 tablet 0   KRILL OIL PO Take 350 mg by mouth daily.      metoprolol tartrate (LOPRESSOR) 25 MG tablet TAKE 1/2 TABLET (12.5 MG TOTAL) BY MOUTH 2 (TWO) TIMES DAILY AS NEEDED (PALPITATIONS). 90 tablet 1   nystatin (MYCOSTATIN/NYSTOP) powder Apply 1 Application topically 3 (three) times daily. Apply to affected area for up to 7 days 30 g 2   omeprazole (PRILOSEC) 20 MG capsule Take 1 capsule (20 mg total) by mouth daily. 90 capsule 1   Probiotic Product (PROBIOTIC PO) Take by mouth daily.     rosuvastatin (CRESTOR) 10 MG tablet Take 1 tablet (10 mg total) by mouth daily. 90 tablet 1   norethindrone (AYGESTIN) 5 MG tablet Take one tablet (5 mg) by mouth twice daily. 60 tablet 0   Current Facility-Administered Medications  Medication Dose Route Frequency Provider Last Rate Last Admin   cyanocobalamin (VITAMIN B12) injection 1,000 mcg  1,000 mcg Intramuscular Q30 days Bennie Pierini, FNP   1,000 mcg at 04/25/23 1434     ALLERGIES: Egg-derived products, Fluarix [influenza virus vaccine], Other, Peanut-containing drug products, Penicillins, Apple,  Blueberry flavor, Kiwi extract, Watermelon flavor, Amoxicillin, and Sulfa antibiotics  Family History  Problem Relation Age of Onset   Hypertension Mother    Diabetes Mother    Other Mother        renal disease   Hypertension Father    Heart attack Father    Hypertension Sister    Diabetes Brother    Diabetes Brother    Sleep apnea Neg Hx     Social History   Socioeconomic History   Marital status: Single    Spouse name: Not on file   Number of children: Not on file   Years of education: Not on file   Highest education level: 12th grade  Occupational History   Not on file  Tobacco Use   Smoking status: Never   Smokeless tobacco: Never  Vaping Use   Vaping status: Never Used  Substance and Sexual Activity   Alcohol use: No    Alcohol/week: 0.0 standard drinks of alcohol   Drug use: Never   Sexual activity: Not Currently    Partners: Male    Birth control/protection: Abstinence  Other Topics Concern   Not on file  Social History Narrative   Not on file   Social Determinants of Health   Financial Resource Strain: Low Risk  (03/10/2023)   Overall Financial Resource Strain (CARDIA)    Difficulty of Paying Living Expenses: Not hard at all  Food Insecurity: No Food Insecurity (03/10/2023)   Hunger Vital Sign    Worried About Running Out of Food in the Last Year: Never true    Ran Out of Food in the Last Year: Never true  Transportation Needs: No Transportation Needs (03/10/2023)   PRAPARE - Administrator, Civil Service (Medical): No    Lack of Transportation (Non-Medical): No  Physical Activity: Unknown (03/10/2023)   Exercise Vital Sign    Days of Exercise per Week: 0 days    Minutes of Exercise per Session: Not on file  Stress: Stress Concern Present (03/10/2023)   [employer] of Occupational Health - Occupational Stress Questionnaire    Feeling of Stress : To some extent  Social Connections: Socially Isolated (03/10/2023)   Social Connection and  Isolation Panel [NHANES]    Frequency of Communication with Friends and Family: More than three times a week    Frequency of Social Gatherings with Friends and Family: Once a week    Attends Religious Services: Never    Database administrator or Organizations: No    Attends Engineer, structural: Not on file    Marital Status: Never married  Intimate Partner Violence: Not on file    Review of Systems  All other systems reviewed and are negative.   PHYSICAL EXAMINATION:    BP 124/80 (BP Location: Right Arm, Patient Position: Sitting, Cuff Size: Normal)   Pulse 75   Ht 5' 5.5" (1.664 m)   Wt (!) 301 lb (136.5 kg)   SpO2 97%   BMI 49.33 kg/m     General appearance: alert, cooperative and appears stated age  ASSESSMENT  Postmenopausal bleeding. Currently on Provera 20 mg daily.   PLAN  Will stop Provera 20 mg daily and start Aygestin 5 mg po bid.  She will return in 1 month for recheck and endometrial biopsy.  We are working toward hysterectomy.   30 min  total time was spent for this patient encounter, including preparation, face-to-face counseling with the patient, coordination of care, and documentation of the encounter.

## 2023-05-25 ENCOUNTER — Ambulatory Visit: Payer: Medicare HMO | Admitting: Obstetrics and Gynecology

## 2023-05-28 ENCOUNTER — Ambulatory Visit (INDEPENDENT_AMBULATORY_CARE_PROVIDER_SITE_OTHER): Payer: Medicare HMO

## 2023-05-28 DIAGNOSIS — E538 Deficiency of other specified B group vitamins: Secondary | ICD-10-CM

## 2023-05-28 NOTE — Progress Notes (Signed)
B12 injection given in right deltoid without difficulty. Pt has no concerns today. Next appt scheduled 8/22 at 9am

## 2023-05-30 NOTE — Telephone Encounter (Signed)
Pt was seen by BS on 05/24/2023. Will close this encounter.

## 2023-06-06 DIAGNOSIS — H2513 Age-related nuclear cataract, bilateral: Secondary | ICD-10-CM | POA: Diagnosis not present

## 2023-06-06 DIAGNOSIS — H52223 Regular astigmatism, bilateral: Secondary | ICD-10-CM | POA: Diagnosis not present

## 2023-06-06 DIAGNOSIS — H524 Presbyopia: Secondary | ICD-10-CM | POA: Diagnosis not present

## 2023-06-13 NOTE — Progress Notes (Signed)
GYNECOLOGY  VISIT   HPI: 52 y.o.   Single  African American  female   G0P0000 with No LMP recorded. Patient is perimenopausal.   here for   4 week med f/u and possible endo bx.  Patient has been followed for irregular bleeding.   She stopped having menses in 2020.  [facility] 09/26/19 was 10.7.  She had sporadic periods following this, which prompted evaluation for potential postmenopausal bleeding.   Her first endometrial biopsy was in 05/11/22 showed a benign polyp and proliferative endometrium.  A pelvic US the same day was unremarkable.  EMS 2.76 mm, normal uterus, normal ovaries.   She developed bleeding following the biopsy, and she was started on Provera.  She had a hysteroscopy with dilation and curettage 07/11/22 and her final pathology showed a benign polyp and inactive endometrium.   She has been treated post surgery with Provera or Aygestin to control her bleeding.   Her last endometrial biopsy was performed 01/17/23 and showed benign inactive endometrium, no evidence of hyperplasia, atypia, or malignancy.    Sonohysterogram performed 03/15/23 due to ongoing bleeding, and this showed no myometrial masses, EMS 2.9 with no endometrial masses, and normal ovaries.   She is currently on Aygestin 5 mg po bid for 2 months.  Bleeding stopped right after starting the medication.    Some vulvar itching that comes and goes.  She wears Poise pads due to urinary incontinence.   Labs 03/16/21:  FSH 61.0, estradiol 16. Labs 03/23/22:  FSH 44.8, estradiol 26. Labs 06/14/22:  FSH 42.8, estradiol 17.  Not sexually active for years.   GYNECOLOGIC HISTORY: No LMP recorded. Patient is perimenopausal. Contraception:  peri/ aygestin Menopausal hormone therapy:  n/a Last mammogram:  08/16/22 Breast Density Cat A, BI-RADS CAT 1 neg  Last pap smear:   03/23/22 neg: HR HPV neg, 1//20/20 neg         OB History     Gravida  0   Para  0   Term  0   Preterm  0   AB  0   Living  0      SAB  0    IAB  0   Ectopic  0   Multiple  0   Live Births                 Patient Active Problem List   Diagnosis Date Noted   Neck pain 02/02/2022   GAD (generalized anxiety disorder) 12/16/2021   Hypertension 09/10/2020   GERD without esophagitis 05/02/2019   BMI 45.0-49.9, adult (HCC) 05/02/2019   Low serum vitamin B12 11/17/2016    Past Medical History:  Diagnosis Date   Anemia    Arthritis    neck, right foot, left shoulder   Balance disorder    06-27-2022  per pt at times "feels off balance without dizziness" , stated has had neurology/ cardiology/ and ENT work-up without etiology   Endometrial polyp    GERD (gastroesophageal reflux disease)    Hypertension    MVP (mitral valve prolapse)    no evidence per echo in epic 10-21-2020, has trivial mitral valve regurg   OSA on CPAP 10/2021   followed by neurologist--- dr Frances Furbish;  sleep study in epic 10-26-2021 severe osa ( 06-27-2022 per pt uses apap nightly)   PMB (postmenopausal bleeding)    Pre-diabetes    PVC's (premature ventricular contractions)    followed by cardiologist--- dr branch;  last event monitor 11-11-2020  frequent PVCs (  6%), NSR w/ rare SVT;   low risk nuclear study 10-21-2020 w/ no ischemia   Vitamin B 12 deficiency    Vitamin D deficiency 2022   Wears glasses     Past Surgical History:  Procedure Laterality Date   DILATATION & CURETTAGE/HYSTEROSCOPY WITH MYOSURE N/A 07/11/2022   Procedure: DILATATION & CURETTAGE/HYSTEROSCOPY WITH MYOSURE RESECTION OF ENDOMETRIAL POLYPS;  Surgeon: Patton Salles, MD;  Location: [facility];  Service: Gynecology;  Laterality: N/A;   NO PAST SURGERIES      Current Outpatient Medications  Medication Sig Dispense Refill   acetaminophen (TYLENOL) 650 MG CR tablet Take 1,300 mg by mouth daily.     amLODipine (NORVASC) 2.5 MG tablet Take 1 tablet (2.5 mg total) by mouth daily. 90 tablet 1   Carboxymethylcellul-Glycerin (CLEAR EYES FOR DRY EYES OP)  Place 1-2 drops into both eyes daily as needed (dry eyes).     Cholecalciferol (VITAMIN D3) 50 MCG (2000 UT) TABS Take 1 tablet by mouth daily after lunch.     clindamycin (CLEOCIN-T) 1 % external solution Apply topically 2 (two) times daily. (Patient taking differently: Apply topically 2 (two) times daily as needed.) 30 mL 1   Cyanocobalamin (B-12 COMPLIANCE INJECTION IJ) Inject as directed every 30 (thirty) days.     escitalopram (LEXAPRO) 10 MG tablet Take 1 tablet (10 mg total) by mouth daily. 90 tablet 1   fexofenadine (ALLEGRA) 180 MG tablet TAKE 1 TABLET BY MOUTH EVERY DAY 90 tablet 1   fluticasone (FLONASE) 50 MCG/ACT nasal spray SPRAY 2 SPRAYS INTO EACH NOSTRIL EVERY DAY (Patient taking differently: Place 2 sprays into both nostrils daily as needed for allergies.) 48 mL 1   hydrochlorothiazide (HYDRODIURIL) 25 MG tablet Take 1 tablet (25 mg total) by mouth daily. 90 tablet 1   ibuprofen (ADVIL) 600 MG tablet Take 1 tablet (600 mg total) by mouth every 6 (six) hours as needed. 30 tablet 0   KRILL OIL PO Take 350 mg by mouth daily.      metoprolol tartrate (LOPRESSOR) 25 MG tablet TAKE 1/2 TABLET (12.5 MG TOTAL) BY MOUTH 2 (TWO) TIMES DAILY AS NEEDED (PALPITATIONS). 90 tablet 1   norethindrone (AYGESTIN) 5 MG tablet TAKE ONE TABLET (5 MG) BY MOUTH TWICE DAILY. 60 tablet 0   nystatin (MYCOSTATIN/NYSTOP) powder Apply 1 Application topically 3 (three) times daily. Apply to affected area for up to 7 days 30 g 2   omeprazole (PRILOSEC) 20 MG capsule Take 1 capsule (20 mg total) by mouth daily. 90 capsule 1   Probiotic Product (PROBIOTIC PO) Take by mouth daily.     rosuvastatin (CRESTOR) 10 MG tablet Take 1 tablet (10 mg total) by mouth daily. 90 tablet 1   Current Facility-Administered Medications  Medication Dose Route Frequency Provider Last Rate Last Admin   cyanocobalamin (VITAMIN B12) injection 1,000 mcg  1,000 mcg Intramuscular Q30 days Bennie Pierini, FNP   1,000 mcg at 05/28/23  4098     ALLERGIES: Egg-derived products, Fluarix [influenza virus vaccine], Other, Peanut-containing drug products, Penicillins, Apple, Blueberry flavor, Kiwi extract, Watermelon flavor, Amoxicillin, and Sulfa antibiotics  Family History  Problem Relation Age of Onset   Hypertension Mother    Diabetes Mother    Other Mother        renal disease   Hypertension Father    Heart attack Father    Hypertension Sister    Diabetes Brother    Diabetes Brother    Sleep apnea Neg  Hx     Social History   Socioeconomic History   Marital status: Single    Spouse name: Not on file   Number of children: Not on file   Years of education: Not on file   Highest education level: 12th grade  Occupational History   Not on file  Tobacco Use   Smoking status: Never   Smokeless tobacco: Never  Vaping Use   Vaping status: Never Used  Substance and Sexual Activity   Alcohol use: No    Alcohol/week: 0.0 standard drinks of alcohol   Drug use: Never   Sexual activity: Not Currently    Partners: Male    Birth control/protection: Abstinence  Other Topics Concern   Not on file  Social History Narrative   Not on file   Social Determinants of Health   Financial Resource Strain: Low Risk  (03/10/2023)   Overall Financial Resource Strain (CARDIA)    Difficulty of Paying Living Expenses: Not hard at all  Food Insecurity: No Food Insecurity (03/10/2023)   Hunger Vital Sign    Worried About Running Out of Food in the Last Year: Never true    Ran Out of Food in the Last Year: Never true  Transportation Needs: No Transportation Needs (03/10/2023)   PRAPARE - Administrator, Civil Service (Medical): No    Lack of Transportation (Non-Medical): No  Physical Activity: Unknown (03/10/2023)   Exercise Vital Sign    Days of Exercise per Week: 0 days    Minutes of Exercise per Session: Not on file  Stress: Stress Concern Present (03/10/2023)   [employer] of Occupational Health - Occupational  Stress Questionnaire    Feeling of Stress : To some extent  Social Connections: Socially Isolated (03/10/2023)   Social Connection and Isolation Panel [NHANES]    Frequency of Communication with Friends and Family: More than three times a week    Frequency of Social Gatherings with Friends and Family: Once a week    Attends Religious Services: Never    Database administrator or Organizations: No    Attends Engineer, structural: Not on file    Marital Status: Never married  Intimate Partner Violence: Not on file    Review of Systems  All other systems reviewed and are negative.   PHYSICAL EXAMINATION:    BP 126/72 (BP Location: Right Arm, Patient Position: Sitting, Cuff Size: Large)   Pulse 65   Ht 5' 5.5" (1.664 m)   Wt (!) 301 lb (136.5 kg)   SpO2 97%   BMI 49.33 kg/m     General appearance: alert, cooperative and appears stated age   Pelvic: External genitalia:  no lesions              Urethra:  normal appearing urethra with no masses, tenderness or lesions              Bartholins and Skenes: normal                 Vagina: normal appearing vagina with normal color and discharge, no lesions              Cervix: no lesions                Bimanual Exam:  Uterus:  normal size, contour, position, consistency, mobility, non-tender              Adnexa: no mass, fullness, tenderness      EMB: Consent  for procedure. Sterile prep with Hibiclens.  Paracervical block with 1% lidocaine 10 cc, lot number  [id number], expiration July 2026. Tenaculum to anterior cervical lip. Pipelle passed to  7   cm twice.   Tissue to pathology.  Minimal EBL. No complications.   Chaperone was present for exam:  Warren Lacy, CMA  ASSESSMENT  Postmenopausal bleeding versus abnormal perimenopausal bleeding.  Elevated FSH but did have an estradiol level that was more perimenopausal in May, 2023.  Hx proliferative endometrium and benign endometrial polyp  PLAN  FU endometrial biopsy  result. Continue Aygestin 5 mg po bid.  Information on laparoscopic hysterectomy to patient.  Surgical consultation with Dr. Karma Greaser.    20  total time was spent for this patient encounter, including preparation, face-to-face counseling with the patient, coordination of care, and documentation of the encounter in addition to doing the endometrial biopsy.

## 2023-06-16 ENCOUNTER — Other Ambulatory Visit: Payer: Self-pay | Admitting: Obstetrics and Gynecology

## 2023-06-18 NOTE — Telephone Encounter (Signed)
Med refill request: Aygestin 5mg  tab PO BID  Last AEX: 03/23/22 -BS Next AEX: 08/29/23-BS  Scheduled for 4wk f/u and EMB on 06/27/23   Last MMG (if hormonal med) 08/16/22 -BiRads 1 neg  Rx sent on 05/24/23 #60/0RF  Rx pended #60/0RF  Refill authorized: Please Advise?

## 2023-06-20 ENCOUNTER — Ambulatory Visit: Payer: Medicare HMO | Admitting: Obstetrics and Gynecology

## 2023-06-27 ENCOUNTER — Encounter: Payer: Self-pay | Admitting: Obstetrics and Gynecology

## 2023-06-27 ENCOUNTER — Ambulatory Visit: Payer: Medicare HMO | Admitting: Obstetrics and Gynecology

## 2023-06-27 ENCOUNTER — Other Ambulatory Visit (HOSPITAL_COMMUNITY)
Admission: RE | Admit: 2023-06-27 | Discharge: 2023-06-27 | Disposition: A | Discharge status: Home / Self Care | Payer: Medicare HMO | Source: Ambulatory Visit | Attending: Obstetrics and Gynecology | Admitting: Obstetrics and Gynecology

## 2023-06-27 VITALS — BP 126/72 | HR 65 | Ht 65.5 in | Wt 301.0 lb

## 2023-06-27 DIAGNOSIS — N924 Excessive bleeding in the premenopausal period: Secondary | ICD-10-CM | POA: Insufficient documentation

## 2023-06-27 DIAGNOSIS — N858 Other specified noninflammatory disorders of uterus: Secondary | ICD-10-CM | POA: Diagnosis not present

## 2023-06-27 MED ORDER — NORETHINDRONE ACETATE 5 MG PO TABS: ORAL_TABLET | ORAL | 0 refills | Status: DC

## 2023-06-27 NOTE — Patient Instructions (Signed)
Total Laparoscopic Hysterectomy A total laparoscopic hysterectomy is a minimally invasive surgery to remove the uterus and cervix. The fallopian tubes and ovaries can also be removed during this surgery, if necessary. This procedure may be done to treat problems such as: Growths in the uterus (uterine fibroids) that are not cancer but cause symptoms. A condition that causes the lining of the uterus to grow in other areas (endometriosis). Problems with pelvic support. Cancer of the cervix, ovaries, uterus, or tissue that lines the uterus (endometrium). Excessive bleeding in the uterus. After this procedure, you will no longer be able to have a baby, and you will no longer have a menstrual period. Tell a health care provider about: Any allergies you have. All medicines you are taking, including vitamins, herbs, eye drops, creams, and over-the-counter medicines. Any problems you or family members have had with anesthetic medicines. Any blood disorders you have. Any surgeries you have had. Any medical conditions you have. Whether you are pregnant or may be pregnant. What are the risks? Generally, this is a safe procedure. However, problems may occur, including: Infection. Bleeding. Blood clots in the legs or lungs. Allergic reactions to medicines. Damage to nearby structures or organs. Having to change from this surgery to one in which a large incision is made in the abdomen (abdominal hysterectomy). What happens before the procedure? Staying hydrated Follow instructions from your health care provider about hydration, which may include: Up to 2 hours before the procedure - you may continue to drink clear liquids, such as water, clear fruit juice, black coffee, and plain tea.  Eating and drinking restrictions Follow instructions from your health care provider about eating and drinking, which may include: 8 hours before the procedure - stop eating heavy meals or foods, such as meat, fried  foods, or fatty foods. 6 hours before the procedure - stop eating light meals or foods, such as toast or cereal. 6 hours before the procedure - stop drinking milk or drinks that contain milk. 2 hours before the procedure - stop drinking clear liquids. Medicines Take over-the-counter and prescription medicines only as told by your health care provider. You may be asked to take medicine that helps you have a bowel movement (laxative) to prevent constipation. General instructions If you were asked to do bowel preparation before the procedure, follow instructions from your health care provider. This procedure can affect the way you feel about yourself. Talk with your health care provider about the physical and emotional changes hysterectomy may cause. Do not use any products that contain nicotine or tobacco for at least 4 weeks before the procedure. These products include cigarettes, chewing tobacco, and vaping devices, such as e-cigarettes. If you need help quitting, ask your health care provider. Plan to have a responsible adult take you home from the hospital or clinic. Plan to have a responsible adult care for you for the time you are told after you leave the hospital or clinic. This is important. Surgery safety Ask your health care provider: How your surgery site will be marked. What steps will be taken to help prevent infection. These may include: Removing hair at the surgery site. Washing skin with a germ-killing soap. Receiving antibiotic medicine. What happens during the procedure? An IV will be inserted into one of your veins. You will be given one or more of the following: A medicine to help you relax (sedative). A medicine to make you fall asleep (general anesthetic). A medicine to numb the area (local anesthetic). A medicine  that is injected into your spine to numb the area below and slightly above the injection site (spinal anesthetic). A medicine that is injected into an area of  your body to numb everything below the injection site (regional anesthetic). A gas will be used to inflate your abdomen. This will allow your surgeon to look inside your abdomen and do the surgery. Three or four small incisions will be made in your abdomen. A small device with a light (laparoscope) will be inserted into one of your incisions. Surgical instruments will be inserted through the other incisions in order to perform the procedure. Your uterus and cervix may be removed through your vagina or cut into small pieces and removed through the small incisions. Any other organs that need to be removed will also be removed this way. The gas will be released from inside your abdomen. Your incisions will be closed with stitches (sutures), skin glue, or adhesive strips. A bandage (dressing) may be placed over your incisions. The procedure may vary among health care providers and hospitals. What happens after the procedure? Your blood pressure, heart rate, breathing rate, and blood oxygen level will be monitored until you leave the hospital or clinic. You will be given medicine for pain as needed. You will be encouraged to walk as soon as possible. You will also use a device to help you breathe or do breathing exercises to keep your lungs clear. You may have to wear compression stockings. These stockings help to prevent blood clots and reduce swelling in your legs. You will need to wear a sanitary pad for vaginal discharge or bleeding. Summary Total laparoscopic hysterectomy is a procedure to remove your uterus, cervix, and sometimes the fallopian tubes and ovaries. This procedure can affect the way you feel about yourself. Talk with your health care provider about the physical and emotional changes hysterectomy may cause. After this procedure, you will no longer be able to have a baby, and you will no longer have a menstrual period. You will be given pain medicine to control discomfort after this  procedure. Plan to have a responsible adult take you home from the hospital or clinic. This information is not intended to replace advice given to you by your health care provider. Make sure you discuss any questions you have with your health care provider. Document Revised: 01/04/2022 Document Reviewed: 06/25/2020 Elsevier Patient Education  2024 [employer].

## 2023-06-28 ENCOUNTER — Other Ambulatory Visit: Payer: Self-pay | Admitting: Gastroenterology

## 2023-06-28 ENCOUNTER — Ambulatory Visit (INDEPENDENT_AMBULATORY_CARE_PROVIDER_SITE_OTHER): Payer: Medicare HMO | Admitting: *Deleted

## 2023-06-28 DIAGNOSIS — E538 Deficiency of other specified B group vitamins: Secondary | ICD-10-CM

## 2023-06-28 NOTE — Progress Notes (Signed)
B12 injection, left arm pt tolerated well.

## 2023-06-29 LAB — SURGICAL PATHOLOGY

## 2023-07-14 ENCOUNTER — Other Ambulatory Visit: Payer: Self-pay | Admitting: Obstetrics and Gynecology

## 2023-07-17 NOTE — Telephone Encounter (Signed)
Refill of the Aygestin 5 mg po bid.  30, RF none.

## 2023-07-17 NOTE — Telephone Encounter (Signed)
Rx sent. Encounter closed.

## 2023-07-17 NOTE — Telephone Encounter (Signed)
Med refill request:Norethindrone 5 mg tab, PO BID  OV 06/27/23 -BS Has surgical consult with EB on 08/01/23.   30 day supply sent on 06/27/23 Pharmacy is requesting 90 day supply  Refill authorized: Please Advise?

## 2023-07-26 ENCOUNTER — Other Ambulatory Visit: Payer: Self-pay | Admitting: Obstetrics and Gynecology

## 2023-07-27 NOTE — Telephone Encounter (Signed)
Med refill request: Aygestin Last AEX:  06/27/23 Next OV: 08/01/23 Last MMG (if hormonal med) 08/16/22 Refill authorized: Please Advise, #180, 1 RF

## 2023-07-30 ENCOUNTER — Ambulatory Visit (INDEPENDENT_AMBULATORY_CARE_PROVIDER_SITE_OTHER): Payer: Medicare HMO | Admitting: *Deleted

## 2023-07-30 DIAGNOSIS — Z23 Encounter for immunization: Secondary | ICD-10-CM

## 2023-07-30 DIAGNOSIS — E538 Deficiency of other specified B group vitamins: Secondary | ICD-10-CM | POA: Diagnosis not present

## 2023-07-30 NOTE — Progress Notes (Signed)
Pt given B12 injection IM right deltoid and tolerated well. Pt also given egg free flu shot IM left deltoid.

## 2023-08-01 ENCOUNTER — Ambulatory Visit (INDEPENDENT_AMBULATORY_CARE_PROVIDER_SITE_OTHER): Payer: Medicare HMO | Admitting: Obstetrics and Gynecology

## 2023-08-01 ENCOUNTER — Encounter: Payer: Self-pay | Admitting: Obstetrics and Gynecology

## 2023-08-01 VITALS — BP 112/74 | HR 81 | Ht 65.25 in | Wt 307.0 lb

## 2023-08-01 DIAGNOSIS — N950 Postmenopausal bleeding: Secondary | ICD-10-CM

## 2023-08-01 DIAGNOSIS — R232 Flushing: Secondary | ICD-10-CM

## 2023-08-01 MED ORDER — ESTRADIOL 0.0375 MG/24HR TD PTTW: 1.0000 | MEDICATED_PATCH | TRANSDERMAL | 12 refills | Status: DC

## 2023-08-01 NOTE — Progress Notes (Signed)
GYNECOLOGY  VISIT   HPI: Patient presents for surgical consult from Dr. Edward Jolly for [facility] Per her visit: 52 y.o.   Single  African American  female   G0P0000 with No LMP recorded. Patient is perimenopausal.   here for   4 week med f/u and possible endo bx.  Patient has been followed for irregular bleeding.   She stopped having menses in 2020.  [facility] 09/26/19 was 10.7.  She had sporadic periods following this, which prompted evaluation for potential postmenopausal bleeding.   Her first endometrial biopsy was in 05/11/22 showed a benign polyp and proliferative endometrium.  A pelvic US the same day was unremarkable.  EMS 2.76 mm, normal uterus, normal ovaries.   She developed bleeding following the biopsy, and she was started on Provera.  She had a hysteroscopy with dilation and curettage 07/11/22 and her final pathology showed a benign polyp and inactive endometrium.   She has been treated post surgery with Provera or Aygestin to control her bleeding.  Her last endometrial biopsy was performed 01/17/23 and showed benign inactive endometrium, no evidence of hyperplasia, atypia, or malignancy.    Sonohysterogram performed 03/15/23 due to ongoing bleeding, and this showed no myometrial masses, EMS 2.9 with no endometrial masses, and normal ovaries.   She is currently on Aygestin 5 mg po bid for 2 months.  Bleeding stopped right after starting the medication.    Some vulvar itching that comes and goes.  She wears Poise pads due to urinary incontinence.   Labs 03/16/21:  FSH 61.0, estradiol 16. Labs 03/23/22:  FSH 44.8, estradiol 26. Labs 06/14/22:  FSH 42.8, estradiol 17.  Not sexually active for years.  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  Reports hot flashes, fatigue, weight gain She would like a definitive management for the bleeding and would like to proceed with the RLH/BSO   US Transvaginal Non-OB (Accession [id number]) (Order [id number]) Imaging Date: 03/15/2023 Department: [facility] Released By: Lillia Carmel, CMA Authorizing: Patton Salles, MD   Exam Status  Status  Final [99]   PACS Intelerad Image Link   Show images for US Transvaginal Non-OB Related Results   [place] Sonohysterogram Final result 03/15/2023 11:00 AM    Narrative  Indication:  Postmenopausal bleeding, proliferative endometrium treated with daily progesterone, history of endometrial polyp.  Followed with periodic endometrial biopsies.  Findings: Pelvic US Uterus 7.81 x 4.66 x 4.39 cm. No myometrial masses. EMS 2.90 mm. Left ovary 1.91 x 1.29 x 1.31 cm.  ...       Study Result  Narrative & Impression  Indication:  Postmenopausal bleeding, proliferative endometrium treated with daily progesterone, history of endometrial polyp.  Followed with periodic endometrial biopsies.   Findings: Pelvic US Uterus 7.81 x 4.66 x 4.39 cm.  No myometrial masses. EMS 2.90 mm.  Left ovary 1.91 x 1.29 x 1.31 cm.  Right ovary 2.22 x 1.68 x 1.50 cm.  No adnexal masses.  No free fluid.    Sonohysterogram Consent done.  Hibiclens prep.  NS injected in uterine cavity with sterile cannula.  No filling defects noted.  Minimal EBL.  No complications.        GYNECOLOGIC HISTORY: No LMP recorded. Patient is perimenopausal. Contraception:  peri/ aygestin Menopausal hormone therapy:  n/a Last mammogram:  08/16/22 Breast Density Cat A, BI-RADS CAT 1 neg  Last pap smear:   03/23/22 neg: HR HPV neg, 1//20/20 neg  OB History     Gravida  0   Para  0   Term  0   Preterm  0   AB  0   Living  0      SAB  0   IAB  0   Ectopic  0   Multiple  0   Live Births                 Patient Active Problem List   Diagnosis Date Noted   Neck pain 02/02/2022   GAD (generalized anxiety disorder) 12/16/2021   Hypertension 09/10/2020   GERD without esophagitis 05/02/2019   BMI 45.0-49.9, adult (HCC) 05/02/2019   Low serum vitamin B12 11/17/2016     Past Medical History:  Diagnosis Date   Anemia    Arthritis    neck, right foot, left shoulder   Balance disorder    06-27-2022  per pt at times "feels off balance without dizziness" , stated has had neurology/ cardiology/ and ENT work-up without etiology   Endometrial polyp    GERD (gastroesophageal reflux disease)    Hypertension    MVP (mitral valve prolapse)    no evidence per echo in epic 10-21-2020, has trivial mitral valve regurg   OSA on CPAP 10/2021   followed by neurologist--- dr Frances Furbish;  sleep study in epic 10-26-2021 severe osa ( 06-27-2022 per pt uses apap nightly)   PMB (postmenopausal bleeding)    Pre-diabetes    PVC's (premature ventricular contractions)    followed by cardiologist--- dr branch;  last event monitor 11-11-2020  frequent PVCs (6%), NSR w/ rare SVT;   low risk nuclear study 10-21-2020 w/ no ischemia   Vitamin B 12 deficiency    Vitamin D deficiency 2022   Wears glasses     Past Surgical History:  Procedure Laterality Date   DILATATION & CURETTAGE/HYSTEROSCOPY WITH MYOSURE N/A 07/11/2022   Procedure: DILATATION & CURETTAGE/HYSTEROSCOPY WITH MYOSURE RESECTION OF ENDOMETRIAL POLYPS;  Surgeon: Patton Salles, MD;  Location: [facility];  Service: Gynecology;  Laterality: N/A;   NO PAST SURGERIES      Current Outpatient Medications  Medication Sig Dispense Refill   acetaminophen (TYLENOL) 650 MG CR tablet Take 1,300 mg by mouth daily.     amLODipine (NORVASC) 2.5 MG tablet Take 1 tablet (2.5 mg total) by mouth daily. 90 tablet 1   Carboxymethylcellul-Glycerin (CLEAR EYES FOR DRY EYES OP) Place 1-2 drops into both eyes daily as needed (dry eyes).     Cholecalciferol (VITAMIN D3) 50 MCG (2000 UT) TABS Take 1 tablet by mouth daily after lunch.     Cyanocobalamin (B-12 COMPLIANCE INJECTION IJ) Inject as directed every 30 (thirty) days.     escitalopram (LEXAPRO) 10 MG tablet Take 1 tablet (10 mg total) by mouth daily. 90 tablet 1    fexofenadine (ALLEGRA) 180 MG tablet TAKE 1 TABLET BY MOUTH EVERY DAY 90 tablet 1   fluticasone (FLONASE) 50 MCG/ACT nasal spray SPRAY 2 SPRAYS INTO EACH NOSTRIL EVERY DAY (Patient taking differently: Place 2 sprays into both nostrils daily as needed for allergies.) 48 mL 1   hydrochlorothiazide (HYDRODIURIL) 25 MG tablet Take 1 tablet (25 mg total) by mouth daily. 90 tablet 1   ibuprofen (ADVIL) 600 MG tablet Take 1 tablet (600 mg total) by mouth every 6 (six) hours as needed. 30 tablet 0   KRILL OIL PO Take 350 mg by mouth daily.      metoprolol tartrate (LOPRESSOR)  25 MG tablet TAKE 1/2 TABLET (12.5 MG TOTAL) BY MOUTH 2 (TWO) TIMES DAILY AS NEEDED (PALPITATIONS). 90 tablet 1   norethindrone (AYGESTIN) 5 MG tablet TAKE ONE TABLET (5 MG) BY MOUTH TWICE DAILY. 30 tablet 0   nystatin (MYCOSTATIN/NYSTOP) powder Apply 1 Application topically 3 (three) times daily. Apply to affected area for up to 7 days 30 g 2   omeprazole (PRILOSEC) 20 MG capsule Take 1 capsule (20 mg total) by mouth daily. 90 capsule 1   Probiotic Product (PROBIOTIC PO) Take by mouth daily.     rosuvastatin (CRESTOR) 10 MG tablet Take 1 tablet (10 mg total) by mouth daily. 90 tablet 1   Current Facility-Administered Medications  Medication Dose Route Frequency Provider Last Rate Last Admin   cyanocobalamin (VITAMIN B12) injection 1,000 mcg  1,000 mcg Intramuscular Q30 days Bennie Pierini, FNP   1,000 mcg at 07/30/23 0908     ALLERGIES: Egg-derived products, Fluarix [influenza virus vaccine], Other, Peanut-containing drug products, Penicillins, Apple, Blueberry flavor, Kiwi extract, Watermelon flavor, Amoxicillin, and Sulfa antibiotics  Family History  Problem Relation Age of Onset   Hypertension Mother    Diabetes Mother    Other Mother        renal disease   Hypertension Father    Heart attack Father    Hypertension Sister    Diabetes Brother    Diabetes Brother    Sleep apnea Neg Hx     Social History    Socioeconomic History   Marital status: Single    Spouse name: Not on file   Number of children: Not on file   Years of education: Not on file   Highest education level: 12th grade  Occupational History   Not on file  Tobacco Use   Smoking status: Never   Smokeless tobacco: Never  Vaping Use   Vaping status: Never Used  Substance and Sexual Activity   Alcohol use: No    Alcohol/week: 0.0 standard drinks of alcohol   Drug use: Never   Sexual activity: Not Currently    Partners: Male    Birth control/protection: Abstinence  Other Topics Concern   Not on file  Social History Narrative   Not on file   Social Determinants of Health   Financial Resource Strain: Low Risk  (03/10/2023)   Overall Financial Resource Strain (CARDIA)    Difficulty of Paying Living Expenses: Not hard at all  Food Insecurity: No Food Insecurity (03/10/2023)   Hunger Vital Sign    Worried About Running Out of Food in the Last Year: Never true    Ran Out of Food in the Last Year: Never true  Transportation Needs: No Transportation Needs (03/10/2023)   PRAPARE - Administrator, Civil Service (Medical): No    Lack of Transportation (Non-Medical): No  Physical Activity: Unknown (03/10/2023)   Exercise Vital Sign    Days of Exercise per Week: 0 days    Minutes of Exercise per Session: Not on file  Stress: Stress Concern Present (03/10/2023)   [employer] of Occupational Health - Occupational Stress Questionnaire    Feeling of Stress : To some extent  Social Connections: Socially Isolated (03/10/2023)   Social Connection and Isolation Panel [NHANES]    Frequency of Communication with Friends and Family: More than three times a week    Frequency of Social Gatherings with Friends and Family: Once a week    Attends Religious Services: Never    Database administrator or  Organizations: No    Attends Engineer, structural: Not on file    Marital Status: Never married  Intimate Partner  Violence: Not on file    Review of Systems  All other systems reviewed and are negative.   PHYSICAL EXAMINATION:    BP 112/74   Pulse 81   Ht 5' 5.25" (1.657 m)   Wt (!) 307 lb (139.3 kg)   SpO2 97%   BMI 50.70 kg/m     General appearance: alert, cooperative and appears stated age     ASSESSMENT  Postmenopausal bleeding versus abnormal perimenopausal bleeding.  Elevated FSH but did have an estradiol level that was more perimenopausal in May, 2023.  Hx proliferative endometrium and benign endometrial polyp Desires surgical management with RLH/BSO   PLAN Symptomatic fibroid uterus:  Counseled on all options.  She would like to have the [facility].  Counseled extensively on the procedure including but not limited to what to expect and risks and benefits.  Counseled on postop care and pelvic rest for 10 weeks after the surgery with restricted lifting for 6 weeks after.  Counseled on the benefits of the robotic procedure with faster return to daily activities, improved outcomes, and less risk for complications. She would like to have this scheduled.  Counseled on the r/b/a/I of HRT use. To get a hormone panel.  Discussed that she will need progesterone to avoid unopposed estrogen on the endometrial lining and risk for endometrial cancer.  She can stop aygestin after the [facility]. Discussed lower risk for DVT and stroke with the patch. Side effects include risk of breast tenderness and spotting along with low risk of blood clots and stroke with uncontrolled hypertension. Counseled on the benefits to help improve the bone density during menopause.  TO begin low dose patch now. Weight loss: to see Asher Muir for consult.  30 minutes spent on reviewing records, imaging,  and one on one patient time and counseling patient and documentation Dr. Karma Greaser

## 2023-08-02 ENCOUNTER — Telehealth: Payer: Self-pay | Admitting: *Deleted

## 2023-08-02 NOTE — Telephone Encounter (Signed)
Spoke with patient. States she left and forgot to get labs during her appt yesterday.   Future labs placed by Dr. Karma Greaser for Testosterone and estrogen on 08/01/23. Lab appt scheduled for 10/2 at 0830.    Patient request to cancel consult with Jami for weight management on 10/10, not covered by insurance plan per pt. OV cancelled.   Routing to provider for final review. Patient is agreeable to disposition. Will close encounter.

## 2023-08-08 ENCOUNTER — Other Ambulatory Visit: Payer: Medicare HMO

## 2023-08-08 DIAGNOSIS — N950 Postmenopausal bleeding: Secondary | ICD-10-CM

## 2023-08-08 DIAGNOSIS — R232 Flushing: Secondary | ICD-10-CM | POA: Diagnosis not present

## 2023-08-14 LAB — ESTROGENS, TOTAL: Estrogen: 82 pg/mL

## 2023-08-15 LAB — TESTOS,TOTAL,FREE AND SHBG (FEMALE)
Free Testosterone: 1 pg/mL (ref 0.1–6.4)
Sex Hormone Binding: 7.1 nmol/L — ABNORMAL LOW (ref 17–124)
Testosterone, Total, LC-MS-MS: 4 ng/dL (ref 2–45)

## 2023-08-15 NOTE — Progress Notes (Signed)
52 y.o. G0P0000 Single African American female here for annual exam.    Has robotic hysterectomy and bilateral salpingo-oophorectomy for abnormal perimenopausal bleeding on 09/07/23 with Dr. Karma Greaser.  She is currently on a transdermal estradiol 0.0375 twice weekly for night time vasomotor symptoms.   She is taking Aygestin twice daily to control her bleeding.   Declines STD screening.  Not sexually active.   PCP: Bennie Pierini, FNP   No LMP recorded. Patient is perimenopausal.           Sexually active: No.  The current method of family planning is abstinence.    Exercising: No.   Smoker:  no  OB History  Gravida Para Term Preterm AB Living  0 0 0 0 0 0  SAB IAB Ectopic Multiple Live Births  0 0 0 0       Health Maintenance: Pap: 03/23/22 neg: HR HPV neg, 1//20/20 neg  History of abnormal Pap:  no MMG: 08/16/22 Breast Density Cat A, BI-RADS CAT 1 neg.  She will schedule.  Colonoscopy:  08/24/23 2 polyps - due in 7 years. BMD:  n/a  Result  n/a  HIV: 03/23/22 NR Hep C: 03/23/22 NR  Immunization History  Administered Date(s) Administered   Influenza Inj Mdck Quad Pf 08/20/2019, 09/01/2020, 09/23/2021, 08/16/2022   Influenza Split 08/07/2019   Influenza, Mdck, Trivalent,PF 6+ MOS(egg free) 07/30/2023   Influenza, Quadrivalent, Recombinant, Inj, Pf 09/30/2018   Moderna SARS-COV2 Booster Vaccination 11/01/2020   Moderna Sars-Covid-2 Vaccination 02/04/2020, 03/26/2020   Tdap 11/06/2001, 06/05/2013   Zoster Recombinant(Shingrix) 03/12/2023   Flu vaccine done September, 2024.  Covid vaccine reviewed.   reports that she has never smoked. She has never used smokeless tobacco. She reports that she does not drink alcohol and does not use drugs.  Past Medical History:  Diagnosis Date   Anemia    Arthritis    neck, right foot, left shoulder   Balance disorder    06-27-2022  per pt at times "feels off balance without dizziness" , stated has had neurology/  cardiology/ and ENT work-up without etiology   Endometrial polyp    GERD (gastroesophageal reflux disease)    Hypertension    MVP (mitral valve prolapse)    no evidence per echo in epic 10-21-2020, has trivial mitral valve regurg   OSA on CPAP 10/2021   followed by neurologist--- dr Frances Furbish;  sleep study in epic 10-26-2021 severe osa ( 06-27-2022 per pt uses apap nightly)   PMB (postmenopausal bleeding)    Pre-diabetes    PVC's (premature ventricular contractions)    followed by cardiologist--- dr branch;  last event monitor 11-11-2020  frequent PVCs (6%), NSR w/ rare SVT;   low risk nuclear study 10-21-2020 w/ no ischemia   Vitamin B 12 deficiency    Vitamin D deficiency 2022   Wears glasses     Past Surgical History:  Procedure Laterality Date   COLONOSCOPY WITH PROPOFOL N/A 08/24/2023   Procedure: COLONOSCOPY WITH PROPOFOL;  Surgeon: Jeani Hawking, MD;  Location: [facility];  Service: Gastroenterology;  Laterality: N/A;   DILATATION & CURETTAGE/HYSTEROSCOPY WITH MYOSURE N/A 07/11/2022   Procedure: DILATATION & CURETTAGE/HYSTEROSCOPY WITH MYOSURE RESECTION OF ENDOMETRIAL POLYPS;  Surgeon: Patton Salles, MD;  Location: [facility] [place];  Service: Gynecology;  Laterality: N/A;   NO PAST SURGERIES     POLYPECTOMY  08/24/2023   Procedure: POLYPECTOMY;  Surgeon: Jeani Hawking, MD;  Location: [facility];  Service: Gastroenterology;;    Current  Outpatient Medications  Medication Sig Dispense Refill   acetaminophen (TYLENOL) 650 MG CR tablet Take 1,300 mg by mouth daily.     amLODipine (NORVASC) 2.5 MG tablet Take 1 tablet (2.5 mg total) by mouth daily. 90 tablet 1   Carboxymethylcellul-Glycerin (CLEAR EYES FOR DRY EYES OP) Place 1-2 drops into both eyes daily as needed (dry eyes).     Cholecalciferol (VITAMIN D3) 50 MCG (2000 UT) TABS Take 1 tablet by mouth daily after lunch.     Cyanocobalamin (B-12 COMPLIANCE INJECTION IJ) Inject as directed every 30 (thirty)  days.     escitalopram (LEXAPRO) 10 MG tablet Take 1 tablet (10 mg total) by mouth daily. 90 tablet 1   estradiol (DOTTI) 0.0375 MG/24HR Place 1 patch onto the skin 2 (two) times a week. 8 patch 12   fexofenadine (ALLEGRA) 180 MG tablet TAKE 1 TABLET BY MOUTH EVERY DAY 90 tablet 1   fluticasone (FLONASE) 50 MCG/ACT nasal spray SPRAY 2 SPRAYS INTO EACH NOSTRIL EVERY DAY (Patient taking differently: Place 2 sprays into both nostrils daily as needed for allergies.) 48 mL 1   hydrochlorothiazide (HYDRODIURIL) 25 MG tablet Take 1 tablet (25 mg total) by mouth daily. 90 tablet 1   ibuprofen (ADVIL) 600 MG tablet Take 1 tablet (600 mg total) by mouth every 6 (six) hours as needed. 30 tablet 0   KRILL OIL PO Take 350 mg by mouth daily.      metoprolol tartrate (LOPRESSOR) 25 MG tablet TAKE 1/2 TABLET (12.5 MG TOTAL) BY MOUTH 2 (TWO) TIMES DAILY AS NEEDED (PALPITATIONS). 90 tablet 1   norethindrone (AYGESTIN) 5 MG tablet Take one tablet (5 mg) by mouth twice daily. 30 tablet 0   nystatin (MYCOSTATIN/NYSTOP) powder Apply 1 Application topically 3 (three) times daily. Apply to affected area for up to 7 days 30 g 2   omeprazole (PRILOSEC) 20 MG capsule Take 1 capsule (20 mg total) by mouth daily. 90 capsule 1   Probiotic Product (PROBIOTIC PO) Take by mouth daily.     rosuvastatin (CRESTOR) 10 MG tablet Take 1 tablet (10 mg total) by mouth daily. 90 tablet 1   ibuprofen (ADVIL) 800 MG tablet Take 1 tablet (800 mg total) by mouth every 8 (eight) hours as needed. 30 tablet 1   metoCLOPramide (REGLAN) 10 MG tablet Take 1 tablet (10 mg total) by mouth every 8 (eight) hours as needed for nausea. 5 tablet 0   oxyCODONE (ROXICODONE) 5 MG immediate release tablet Take 1 tablet (5 mg total) by mouth every 4 (four) hours as needed for severe pain (pain score 7-10). 5 tablet 0   Current Facility-Administered Medications  Medication Dose Route Frequency Provider Last Rate Last Admin   cyanocobalamin (VITAMIN B12)  injection 1,000 mcg  1,000 mcg Intramuscular Q30 days Bennie Pierini, FNP   1,000 mcg at 07/30/23 0908    Family History  Problem Relation Age of Onset   Hypertension Mother    Diabetes Mother    Other Mother        renal disease   Hypertension Father    Heart attack Father    Hypertension Sister    Diabetes Brother    Diabetes Brother    Sleep apnea Neg Hx     Review of Systems  All other systems reviewed and are negative.   Exam:   BP 128/86 (BP Location: Right Arm, Patient Position: Sitting, Cuff Size: Large)   Pulse 95   Ht 5' 6.5" (1.689 m)  Wt (!) 306 lb (138.8 kg)   SpO2 96%   BMI 48.65 kg/m     General appearance: alert, cooperative and appears stated age Head: normocephalic, without obvious abnormality, atraumatic Neck: no adenopathy, supple, symmetrical, trachea midline and thyroid normal to inspection and palpation Lungs: clear to auscultation bilaterally Breasts: normal appearance, no masses or tenderness, No nipple retraction or dimpling, No nipple discharge or bleeding, No axillary adenopathy Heart: regular rate and rhythm Abdomen: soft, non-tender; no masses, no organomegaly Extremities: extremities normal, atraumatic, no cyanosis or edema Skin: skin color, texture, turgor normal. No rashes or lesions Lymph nodes: cervical, supraclavicular, and axillary nodes normal. Neurologic: grossly normal  Pelvic: External genitalia:  no lesions              No abnormal inguinal nodes palpated.              Urethra:  normal appearing urethra with no masses, tenderness or lesions              Bartholins and Skenes: normal                 Vagina: normal appearing vagina with normal color and yellow and clumpy discharge, no lesions              Cervix: no lesions              Pap taken: no Bimanual Exam:  Uterus:  normal size, contour, position, consistency, mobility, non-tender.  BM exam limited by abdominal wall tone.               Adnexa: no mass, fullness,  tenderness              Rectal exam: yes.  Confirms.              Anus:  normal sphincter tone, no lesions  Chaperone was present for exam:  Warren Lacy, CMA   Assessment: Well woman with gynecologic exam.  Abnormal perimenopausal bleeding.  Controlled on Aygestin.  Menopausal symptoms controlled on transdermal estrogen.  Vaginal discharge.  Plan:  Mammogram screening discussed.  She will update in December.  Self breast awareness reviewed. Pap and HR HPV not needed.  Guidelines for Calcium, Vitamin D, regular exercise program including cardiovascular and weight bearing exercise. She has enough Aygestin to last until surgery.  She has refills of her transdermal estrogen patch for one year.  Wet prep today:  yeast noted.  Negative for clue cells and trichomonas.  Diflucan 150 mg po x 1.  May repeat in 72 hours prn.  Best wishes for upcoming surgery! Follow up yearly and prn.  After visit summary provided.

## 2023-08-16 ENCOUNTER — Ambulatory Visit: Payer: Medicare HMO | Admitting: Radiology

## 2023-08-17 ENCOUNTER — Encounter (HOSPITAL_COMMUNITY): Payer: Self-pay | Admitting: Gastroenterology

## 2023-08-17 NOTE — Progress Notes (Signed)
Pre op call eval Name:Renee Thomas [facility] FNP Cardiologist-Jonathan Branch MD  EKG-07/11/22 Echo-10/21/20 Cath-n/a Mosetta Putt ICD/PM-n/a Blood thinner-n/a GLP-1- n/a  Hx:HTN, Pre diabetic, OSA , PVC's, Mitral Valve Prolapse although on 2021 echo no evidence, trivial mitral regurg. Patient last saw cardiology 07/28/22, recommended 1 year f/u. I asked patient about due to see cardiology and she said shes waiting on a call from them to schedule. Patient endorses no new cardiac/breathing issues. Anesthesia Review: Yes

## 2023-08-24 ENCOUNTER — Ambulatory Visit (HOSPITAL_COMMUNITY)
Admission: RE | Admit: 2023-08-24 | Discharge: 2023-08-24 | Disposition: A | Discharge status: Home / Self Care | Payer: Medicare HMO | Attending: Gastroenterology | Admitting: Gastroenterology

## 2023-08-24 ENCOUNTER — Ambulatory Visit (HOSPITAL_COMMUNITY): Payer: Medicare HMO | Admitting: Medical

## 2023-08-24 ENCOUNTER — Encounter (HOSPITAL_COMMUNITY): Payer: Self-pay | Admitting: Gastroenterology

## 2023-08-24 ENCOUNTER — Encounter: Payer: Self-pay | Admitting: *Deleted

## 2023-08-24 ENCOUNTER — Encounter (HOSPITAL_COMMUNITY)
Admission: RE | Disposition: A | Discharge status: Home / Self Care | Payer: Self-pay | Source: Home / Self Care | Attending: Gastroenterology

## 2023-08-24 ENCOUNTER — Other Ambulatory Visit: Payer: Self-pay

## 2023-08-24 DIAGNOSIS — K5730 Diverticulosis of large intestine without perforation or abscess without bleeding: Secondary | ICD-10-CM | POA: Insufficient documentation

## 2023-08-24 DIAGNOSIS — K635 Polyp of colon: Secondary | ICD-10-CM | POA: Diagnosis not present

## 2023-08-24 DIAGNOSIS — Z1211 Encounter for screening for malignant neoplasm of colon: Secondary | ICD-10-CM | POA: Diagnosis not present

## 2023-08-24 DIAGNOSIS — G4733 Obstructive sleep apnea (adult) (pediatric): Secondary | ICD-10-CM | POA: Diagnosis not present

## 2023-08-24 DIAGNOSIS — I10 Essential (primary) hypertension: Secondary | ICD-10-CM | POA: Diagnosis not present

## 2023-08-24 DIAGNOSIS — I341 Nonrheumatic mitral (valve) prolapse: Secondary | ICD-10-CM | POA: Insufficient documentation

## 2023-08-24 DIAGNOSIS — K219 Gastro-esophageal reflux disease without esophagitis: Secondary | ICD-10-CM | POA: Insufficient documentation

## 2023-08-24 DIAGNOSIS — D124 Benign neoplasm of descending colon: Secondary | ICD-10-CM | POA: Diagnosis not present

## 2023-08-24 HISTORY — PX: POLYPECTOMY: SHX5525

## 2023-08-24 HISTORY — PX: COLONOSCOPY WITH PROPOFOL: SHX5780

## 2023-08-24 SURGERY — COLONOSCOPY WITH PROPOFOL
Anesthesia: Monitor Anesthesia Care

## 2023-08-24 MED ORDER — LIDOCAINE HCL 1 % IJ SOLN
INTRAMUSCULAR | Status: DC | PRN
  Administered 2023-08-24: 50 mg via INTRADERMAL

## 2023-08-24 MED ORDER — SODIUM CHLORIDE 0.9 % IV SOLN: INTRAVENOUS | Status: DC

## 2023-08-24 MED ORDER — PROPOFOL 500 MG/50ML IV EMUL
INTRAVENOUS | Status: DC | PRN
  Administered 2023-08-24: 120 ug/kg/min via INTRAVENOUS

## 2023-08-24 MED ORDER — PROPOFOL 10 MG/ML IV BOLUS
INTRAVENOUS | Status: DC | PRN
  Administered 2023-08-24: 20 mg via INTRAVENOUS
  Administered 2023-08-24: 10 mg via INTRAVENOUS

## 2023-08-24 SURGICAL SUPPLY — 22 items

## 2023-08-24 NOTE — Anesthesia Postprocedure Evaluation (Signed)
Anesthesia Post Note  Patient: Renee Thomas  Procedure(s) Performed: COLONOSCOPY WITH PROPOFOL POLYPECTOMY     Patient location during evaluation: PACU Anesthesia Type: MAC Level of consciousness: awake and alert Pain management: pain level controlled Vital Signs Assessment: post-procedure vital signs reviewed and stable Respiratory status: spontaneous breathing, nonlabored ventilation and respiratory function stable Cardiovascular status: blood pressure returned to baseline and stable Postop Assessment: no apparent nausea or vomiting Anesthetic complications: no   No notable events documented.  Last Vitals:  Vitals:   08/24/23 0820 08/24/23 0830  BP: (!) 149/79 (!) 164/86  Pulse: 65 79  Resp: (!) 21 (!) 22  Temp:    SpO2: 93% 92%    Last Pain:  Vitals:   08/24/23 0830  TempSrc:   PainSc: 0-No pain                 Lowella Curb

## 2023-08-24 NOTE — Discharge Instructions (Signed)

## 2023-08-24 NOTE — Transfer of Care (Signed)
Immediate Anesthesia Transfer of Care Note  Patient: Renee Thomas  Procedure(s) Performed: COLONOSCOPY WITH PROPOFOL POLYPECTOMY  Patient Location: PACU and Endoscopy Unit  Anesthesia Type:MAC  Level of Consciousness: awake, alert , oriented, and patient cooperative  Airway & Oxygen Therapy: Patient Spontanous Breathing and Patient connected to face mask oxygen  Post-op Assessment: Report given to RN and Post -op Vital signs reviewed and stable  Post vital signs: Reviewed and stable  Last Vitals:  Vitals Value Taken Time  BP 134/77 08/24/23 0804  Temp    Pulse 58 08/24/23 0805  Resp 20 08/24/23 0805  SpO2 100 % 08/24/23 0805  Vitals shown include unfiled device data.  Last Pain:  Vitals:   08/24/23 0651  TempSrc: Temporal  PainSc: 0-No pain         Complications: No notable events documented.

## 2023-08-24 NOTE — H&P (Signed)
Renee Thomas HPI: This 52 year old black female presents to the office for colorectal cancer screening. She has 1-4 BM's per day with no obvious blood or mucus in the stool. She has a good appetite and has gained 20 pounds over the last 5 years. She denies having any complaints of abdominal pain, nausea, vomiting, dysphagia or odynophagia. She denies having a family history of colon cancer, celiac sprue or IBD. Her last colonoscopy was on 05/01/2018 when sessile serrated adenoma and tubular adenoma removed from the colon.  Past Medical History:  Diagnosis Date   Anemia    Arthritis    neck, right foot, left shoulder   Balance disorder    06-27-2022  per pt at times "feels off balance without dizziness" , stated has had neurology/ cardiology/ and ENT work-up without etiology   Endometrial polyp    GERD (gastroesophageal reflux disease)    Hypertension    MVP (mitral valve prolapse)    no evidence per echo in epic 10-21-2020, has trivial mitral valve regurg   OSA on CPAP 10/2021   followed by neurologist--- dr Frances Furbish;  sleep study in epic 10-26-2021 severe osa ( 06-27-2022 per pt uses apap nightly)   PMB (postmenopausal bleeding)    Pre-diabetes    PVC's (premature ventricular contractions)    followed by cardiologist--- dr branch;  last event monitor 11-11-2020  frequent PVCs (6%), NSR w/ rare SVT;   low risk nuclear study 10-21-2020 w/ no ischemia   Vitamin B 12 deficiency    Vitamin D deficiency 2022   Wears glasses     Past Surgical History:  Procedure Laterality Date   DILATATION & CURETTAGE/HYSTEROSCOPY WITH MYOSURE N/A 07/11/2022   Procedure: DILATATION & CURETTAGE/HYSTEROSCOPY WITH MYOSURE RESECTION OF ENDOMETRIAL POLYPS;  Surgeon: Patton Salles, MD;  Location: [facility] [place];  Service: Gynecology;  Laterality: N/A;   NO PAST SURGERIES      Family History  Problem Relation Age of Onset   Hypertension Mother    Diabetes Mother    Other Mother         renal disease   Hypertension Father    Heart attack Father    Hypertension Sister    Diabetes Brother    Diabetes Brother    Sleep apnea Neg Hx     Social History:  reports that she has never smoked. She has never used smokeless tobacco. She reports that she does not drink alcohol and does not use drugs.  Allergies:  Allergies  Allergen Reactions   Egg-Derived Products Swelling    Swells mouth with itching   Fluarix [Influenza Virus Vaccine] Swelling    Contains egg protein.   Other Anaphylaxis    All Tree-Nuts   Peanut-Containing Drug Products Anaphylaxis   Penicillins Rash and Anaphylaxis    .Has patient had a PCN reaction causing immediate rash, facial/tongue/throat swelling, SOB or lightheadedness with hypotension: Yes Has patient had a PCN reaction causing severe rash involving mucus membranes or skin necrosis: No Has patient had a PCN reaction that required hospitalization: no Has patient had a PCN reaction occurring within the last 10 years: no If all of the above answers are "NO", then may proceed with Cephalosporin use.  Other reaction(s): Other (See Comments) .Has patient had a PCN reaction causing immediate rash, facial/tongue/throat swelling, SOB or lightheadedness with hypotension: Yes Has patient had a PCN reaction causing severe rash involving mucus membranes or skin necrosis: No Has patient had a PCN reaction  that required hospitalization: no Has patient had a PCN reaction occurring within the last 10 years: no If all of the above answers are "NO", then may proceed with Cephalosporin use.   Apple Itching    Mouth itching   Blueberry Flavor Itching   Kiwi Extract Itching    Per pt tongue and throat itching/ burning   Watermelon Flavor Itching    And Canteloupe   Amoxicillin Rash   Sulfa Antibiotics Rash    numbness    Medications: Scheduled: Continuous:  sodium chloride      No results found for this or any previous visit (from the past 24 hour(s)).    No results found.  ROS:  As stated above in the HPI otherwise negative.  Pulse 71, temperature 97.7 F (36.5 C), temperature source Temporal, resp. rate 14, height 5\' 5"  (1.651 m), weight (!) 139.7 kg, SpO2 99%.    PE: Gen: NAD, Alert and Oriented HEENT:  [place]/AT, EOMI Neck: Supple, no LAD Lungs: CTA Bilaterally CV: RRR without M/G/R ABD: Soft, NTND, +BS Ext: No C/C/E  Assessment/Plan: 1) Screening colonoscopy.  Martine Bleecker D 08/24/2023, 7:08 AM

## 2023-08-24 NOTE — Op Note (Signed)
[facility] Patient Name: Renee Thomas Procedure Date: 08/24/2023 MRN: [id number] Attending MD: Jeani Hawking , MD, [id number] Date of Birth: 07/20/1971 CSN: [id number] Age: 52 Admit Type: Outpatient Procedure:                Colonoscopy Indications:              Screening for colorectal malignant neoplasm Providers:                Jeani Hawking, MD, Kandice Robinsons, Technician, [facility], Technician, Jacquelyn "Jaci" Clelia Croft, RN Referring MD:              Medicines:                Propofol per Anesthesia Complications:            No immediate complications. Estimated Blood Loss:     Estimated blood loss: none. Procedure:                Pre-Anesthesia Assessment:                           - Prior to the procedure, a History and Physical                            was performed, and patient medications and                            allergies were reviewed. The patient's tolerance of                            previous anesthesia was also reviewed. The risks                            and benefits of the procedure and the sedation                            options and risks were discussed with the patient.                            All questions were answered, and informed consent                            was obtained. Prior Anticoagulants: The patient has                            taken no anticoagulant or antiplatelet agents. ASA                            Grade Assessment: III - A patient with severe                            systemic disease. After reviewing the risks and  benefits, the patient was deemed in satisfactory                            condition to undergo the procedure.                           - Sedation was administered by an anesthesia                            professional. Deep sedation was attained.                           After obtaining informed consent, the colonoscope                             was passed under direct vision. Throughout the                            procedure, the patient's blood pressure, pulse, and                            oxygen saturations were monitored continuously. The                            CF-HQ190L ([id number]) Olympus colonoscope was                            introduced through the anus and advanced to the the                            cecum, identified by appendiceal orifice and                            ileocecal valve. The colonoscopy was performed                            without difficulty. The patient tolerated the                            procedure well. The quality of the bowel                            preparation was evaluated using the BBPS [facility]                            Bowel Preparation Scale) with scores of: Right                            Colon = 3, Transverse Colon = 3 and Left Colon = 3                            (entire mucosa seen well with no residual staining,  small fragments of stool or opaque liquid). The                            total BBPS score equals 9. The ileocecal valve,                            appendiceal orifice, and rectum were photographed. Scope In: 7:41:48 AM Scope Out: 7:57:28 AM Scope Withdrawal Time: 0 hours 9 minutes 35 seconds  Total Procedure Duration: 0 hours 15 minutes 40 seconds  Findings:      Two sessile and semi-pedunculated polyps were found in the descending       colon. The polyps were 3 to 4 mm in size. These polyps were removed with       a cold snare. Resection and retrieval were complete.      Scattered medium-mouthed and small-mouthed diverticula were found in the       sigmoid colon. Impression:               - Two 3 to 4 mm polyps in the descending colon,                            removed with a cold snare. Resected and retrieved.                           - Diverticulosis in the sigmoid colon. Moderate Sedation:      Not Applicable -  Patient had care per Anesthesia. Recommendation:           - Patient has a contact number available for                            emergencies. The signs and symptoms of potential                            delayed complications were discussed with the                            patient. Return to normal activities tomorrow.                            Written discharge instructions were provided to the                            patient.                           - Resume previous diet.                           - Continue present medications.                           - Await pathology results.                           - Repeat colonoscopy in 7 years for surveillance. Procedure Code(s):        ---  Professional ---                           [phone], Colonoscopy, flexible; with removal of                            tumor(s), polyp(s), or other lesion(s) by snare                            technique Diagnosis Code(s):        --- Professional ---                           Z12.11, Encounter for screening for malignant                            neoplasm of colon                           D12.4, Benign neoplasm of descending colon                           K57.30, Diverticulosis of large intestine without                            perforation or abscess without bleeding CPT copyright 2022 American Medical Association. All rights reserved. The codes documented in this report are preliminary and upon coder review may  be revised to meet current compliance requirements. Jeani Hawking, MD Jeani Hawking, MD 08/24/2023 8:07:11 AM This report has been signed electronically. Number of Addenda: 0

## 2023-08-24 NOTE — Anesthesia Preprocedure Evaluation (Signed)
Anesthesia Evaluation  Patient identified by MRN, date of birth, ID band Patient awake    Reviewed: Allergy & Precautions, NPO status , Patient's Chart, lab work & pertinent test results  Airway Mallampati: I  TM Distance: >3 FB Neck ROM: Full    Dental  (+) Teeth Intact, Dental Advisory Given   Pulmonary sleep apnea and Continuous Positive Airway Pressure Ventilation    breath sounds clear to auscultation       Cardiovascular hypertension, Pt. on medications  Rhythm:Regular Rate:Normal     Neuro/Psych  PSYCHIATRIC DISORDERS Anxiety     negative neurological ROS     GI/Hepatic Neg liver ROS,GERD  ,,  Endo/Other    Morbid obesity  Renal/GU negative Renal ROS     Musculoskeletal  (+) Arthritis , Osteoarthritis,    Abdominal  (+) + obese  Peds  Hematology negative hematology ROS (+)   Anesthesia Other Findings   Reproductive/Obstetrics                             Anesthesia Physical Anesthesia Plan  ASA: 3  Anesthesia Plan: MAC   Post-op Pain Management: Minimal or no pain anticipated   Induction: Intravenous  PONV Risk Score and Plan: 2 and Propofol infusion and Treatment may vary due to age or medical condition  Airway Management Planned: Simple Face Mask  Additional Equipment: None  Intra-op Plan:   Post-operative Plan:   Informed Consent: I have reviewed the patients History and Physical, chart, labs and discussed the procedure including the risks, benefits and alternatives for the proposed anesthesia with the patient or authorized representative who has indicated his/her understanding and acceptance.     Dental advisory given  Plan Discussed with: CRNA  Anesthesia Plan Comments:         Anesthesia Quick Evaluation

## 2023-08-27 ENCOUNTER — Encounter: Payer: Self-pay | Admitting: Obstetrics and Gynecology

## 2023-08-27 ENCOUNTER — Ambulatory Visit (INDEPENDENT_AMBULATORY_CARE_PROVIDER_SITE_OTHER): Payer: Medicare HMO | Admitting: Obstetrics and Gynecology

## 2023-08-27 VITALS — BP 128/84 | HR 78 | Ht 65.0 in | Wt 305.0 lb

## 2023-08-27 DIAGNOSIS — N950 Postmenopausal bleeding: Secondary | ICD-10-CM | POA: Diagnosis not present

## 2023-08-27 DIAGNOSIS — D259 Leiomyoma of uterus, unspecified: Secondary | ICD-10-CM | POA: Diagnosis not present

## 2023-08-27 DIAGNOSIS — Z01818 Encounter for other preprocedural examination: Secondary | ICD-10-CM

## 2023-08-27 LAB — SURGICAL PATHOLOGY

## 2023-08-27 NOTE — Progress Notes (Unsigned)
PREOP H&P GYNECOLOGY  VISIT   HPI: Renee Thomas presents for surgical consult from Dr. Edward Jolly for RLH/BSO/cystoscopy Per her visit: 52 y.o.   Single  African American  female   G0P0000 with No LMP recorded. Renee Thomas is perimenopausal.   here for   4 week med f/u and possible endo bx.  Renee Thomas has been followed for irregular bleeding.   She stopped having menses in 2020.  [facility] 09/26/19 was 10.7.  She had sporadic periods following this, which prompted evaluation for potential postmenopausal bleeding.   Her first endometrial biopsy was in 05/11/22 showed a benign polyp and proliferative endometrium.  A pelvic US the same day was unremarkable.  EMS 2.76 mm, normal uterus, normal ovaries.   She developed bleeding following the biopsy, and she was started on Provera.  She had a hysteroscopy with dilation and curettage 07/11/22 and her final pathology showed a benign polyp and inactive endometrium.   She has been treated post surgery with Provera or Aygestin to control her bleeding.  Her last endometrial biopsy was performed 01/17/23 and showed benign inactive endometrium, no evidence of hyperplasia, atypia, or malignancy.    Sonohysterogram performed 03/15/23 due to ongoing bleeding, and this showed no myometrial masses, EMS 2.9 with no endometrial masses, and normal ovaries.   She is currently on Aygestin 5 mg po bid for 2 months.  Bleeding stopped right after starting the medication.    Some vulvar itching that comes and goes.  She wears Poise pads due to urinary incontinence.   Labs 03/16/21:  FSH 61.0, estradiol 16. Labs 03/23/22:  FSH 44.8, estradiol 26. Labs 06/14/22:  FSH 42.8, estradiol 17.  Not sexually active for years.  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  Reports hot flashes, fatigue, weight gain She would like a definitive management for the bleeding and would like to proceed with the RLH/BSO   US Transvaginal Non-OB (Accession [id number]) (Order [id number]) Imaging Date: 03/15/2023  Department: [facility] Released By: Lillia Carmel, CMA Authorizing: Patton Salles, MD   Exam Status  Status  Final [99]   PACS Intelerad Image Link   Show images for US Transvaginal Non-OB Related Results   [place] Sonohysterogram Final result 03/15/2023 11:00 AM    Narrative  Indication:  Postmenopausal bleeding, proliferative endometrium treated with daily progesterone, history of endometrial polyp.  Followed with periodic endometrial biopsies.  Findings: Pelvic US Uterus 7.81 x 4.66 x 4.39 cm. No myometrial masses. EMS 2.90 mm. Left ovary 1.91 x 1.29 x 1.31 cm.  ...       Study Result  Narrative & Impression  Indication:  Postmenopausal bleeding, proliferative endometrium treated with daily progesterone, history of endometrial polyp.  Followed with periodic endometrial biopsies.   Findings: Pelvic US Uterus 7.81 x 4.66 x 4.39 cm.  No myometrial masses. EMS 2.90 mm.  Left ovary 1.91 x 1.29 x 1.31 cm.  Right ovary 2.22 x 1.68 x 1.50 cm.  No adnexal masses.  No free fluid.    Sonohysterogram Consent done.  Hibiclens prep.  NS injected in uterine cavity with sterile cannula.  No filling defects noted.  Minimal EBL.  No complications.        GYNECOLOGIC HISTORY: No LMP recorded. Renee Thomas is perimenopausal. Contraception:  peri/ aygestin Menopausal hormone therapy:  n/a Last mammogram:  08/16/22 Breast Density Cat A, BI-RADS CAT 1 neg  Last pap smear:   03/23/22 neg: HR HPV neg, 1//20/20 neg  OB History     Gravida  0   Para  0   Term  0   Preterm  0   AB  0   Living  0      SAB  0   IAB  0   Ectopic  0   Multiple  0   Live Births                 Renee Thomas Active Problem List   Diagnosis Date Noted  . Neck pain 02/02/2022  . GAD (generalized anxiety disorder) 12/16/2021  . Hypertension 09/10/2020  . GERD without esophagitis 05/02/2019  . BMI 45.0-49.9, adult (HCC) 05/02/2019  . Low  serum vitamin B12 11/17/2016    Past Medical History:  Diagnosis Date  . Anemia   . Arthritis    neck, right foot, left shoulder  . Balance disorder    06-27-2022  per pt at times "feels off balance without dizziness" , stated has had neurology/ cardiology/ and ENT work-up without etiology  . Endometrial polyp   . GERD (gastroesophageal reflux disease)   . Hypertension   . MVP (mitral valve prolapse)    no evidence per echo in epic 10-21-2020, has trivial mitral valve regurg  . OSA on CPAP 10/2021   followed by neurologist--- dr Frances Furbish;  sleep study in epic 10-26-2021 severe osa ( 06-27-2022 per pt uses apap nightly)  . PMB (postmenopausal bleeding)   . Pre-diabetes   . PVC's (premature ventricular contractions)    followed by cardiologist--- dr branch;  last event monitor 11-11-2020  frequent PVCs (6%), NSR w/ rare SVT;   low risk nuclear study 10-21-2020 w/ no ischemia  . Vitamin B 12 deficiency   . Vitamin D deficiency 2022  . Wears glasses     Past Surgical History:  Procedure Laterality Date  . DILATATION & CURETTAGE/HYSTEROSCOPY WITH MYOSURE N/A 07/11/2022   Procedure: DILATATION & CURETTAGE/HYSTEROSCOPY WITH MYOSURE RESECTION OF ENDOMETRIAL POLYPS;  Surgeon: Patton Salles, MD;  Location: [facility] [place];  Service: Gynecology;  Laterality: N/A;  . NO PAST SURGERIES      Current Outpatient Medications  Medication Sig Dispense Refill  . acetaminophen (TYLENOL) 650 MG CR tablet Take 1,300 mg by mouth daily.    Marland Kitchen amLODipine (NORVASC) 2.5 MG tablet Take 1 tablet (2.5 mg total) by mouth daily. 90 tablet 1  . Carboxymethylcellul-Glycerin (CLEAR EYES FOR DRY EYES OP) Place 1-2 drops into both eyes daily as needed (dry eyes).    . Cholecalciferol (VITAMIN D3) 50 MCG (2000 UT) TABS Take 1 tablet by mouth daily after lunch.    . Cyanocobalamin (B-12 COMPLIANCE INJECTION IJ) Inject as directed every 30 (thirty) days.    Marland Kitchen escitalopram (LEXAPRO) 10 MG tablet Take  1 tablet (10 mg total) by mouth daily. 90 tablet 1  . estradiol (DOTTI) 0.0375 MG/24HR Place 1 patch onto the skin 2 (two) times a week. 8 patch 12  . fexofenadine (ALLEGRA) 180 MG tablet TAKE 1 TABLET BY MOUTH EVERY DAY 90 tablet 1  . fluticasone (FLONASE) 50 MCG/ACT nasal spray SPRAY 2 SPRAYS INTO EACH NOSTRIL EVERY DAY (Renee Thomas taking differently: Place 2 sprays into both nostrils daily as needed for allergies.) 48 mL 1  . hydrochlorothiazide (HYDRODIURIL) 25 MG tablet Take 1 tablet (25 mg total) by mouth daily. 90 tablet 1  . KRILL OIL PO Take 350 mg by mouth daily.     . metoprolol tartrate (LOPRESSOR) 25 MG tablet TAKE  1/2 TABLET (12.5 MG TOTAL) BY MOUTH 2 (TWO) TIMES DAILY AS NEEDED (PALPITATIONS). 90 tablet 1  . norethindrone (AYGESTIN) 5 MG tablet TAKE ONE TABLET (5 MG) BY MOUTH TWICE DAILY. 30 tablet 0  . nystatin (MYCOSTATIN/NYSTOP) powder Apply 1 Application topically 3 (three) times daily. Apply to affected area for up to 7 days 30 g 2  . omeprazole (PRILOSEC) 20 MG capsule Take 1 capsule (20 mg total) by mouth daily. 90 capsule 1  . Probiotic Product (PROBIOTIC PO) Take by mouth daily.    . rosuvastatin (CRESTOR) 10 MG tablet Take 1 tablet (10 mg total) by mouth daily. 90 tablet 1  . ibuprofen (ADVIL) 600 MG tablet Take 1 tablet (600 mg total) by mouth every 6 (six) hours as needed. (Renee Thomas not taking: Reported on 08/27/2023) 30 tablet 0   Current Facility-Administered Medications  Medication Dose Route Frequency Provider Last Rate Last Admin  . cyanocobalamin (VITAMIN B12) injection 1,000 mcg  1,000 mcg Intramuscular Q30 days Bennie Pierini, FNP   1,000 mcg at 07/30/23 0908     ALLERGIES: Egg-derived products, Fluarix [influenza virus vaccine], Other, Peanut-containing drug products, Penicillins, Apple, Blueberry flavor, Kiwi extract, Watermelon flavor, Amoxicillin, and Sulfa antibiotics  Family History  Problem Relation Age of Onset  . Hypertension Mother   . Diabetes  Mother   . Other Mother        renal disease  . Hypertension Father   . Heart attack Father   . Hypertension Sister   . Diabetes Brother   . Diabetes Brother   . Sleep apnea Neg Hx     Social History   Socioeconomic History  . Marital status: Single    Spouse name: Not on file  . Number of children: Not on file  . Years of education: Not on file  . Highest education level: 12th grade  Occupational History  . Not on file  Tobacco Use  . Smoking status: Never  . Smokeless tobacco: Never  Vaping Use  . Vaping status: Never Used  Substance and Sexual Activity  . Alcohol use: No    Alcohol/week: 0.0 standard drinks of alcohol  . Drug use: Never  . Sexual activity: Not Currently    Partners: Male    Birth control/protection: Abstinence  Other Topics Concern  . Not on file  Social History Narrative  . Not on file   Social Determinants of Health   Financial Resource Strain: Low Risk  (03/10/2023)   Overall Financial Resource Strain (CARDIA)   . Difficulty of Paying Living Expenses: Not hard at all  Food Insecurity: No Food Insecurity (03/10/2023)   Hunger Vital Sign   . Worried About Programme researcher, broadcasting/film/video in the Last Year: Never true   . Ran Out of Food in the Last Year: Never true  Transportation Needs: No Transportation Needs (03/10/2023)   PRAPARE - Transportation   . Lack of Transportation (Medical): No   . Lack of Transportation (Non-Medical): No  Physical Activity: Unknown (03/10/2023)   Exercise Vital Sign   . Days of Exercise per Week: 0 days   . Minutes of Exercise per Session: Not on file  Stress: Stress Concern Present (03/10/2023)   [employer] of Occupational Health - Occupational Stress Questionnaire   . Feeling of Stress : To some extent  Social Connections: Socially Isolated (03/10/2023)   Social Connection and Isolation Panel [NHANES]   . Frequency of Communication with Friends and Family: More than three times a week   .  Frequency of Social Gatherings  with Friends and Family: Once a week   . Attends Religious Services: Never   . Active Member of Clubs or Organizations: No   . Attends Banker Meetings: Not on file   . Marital Status: Never married  Intimate Partner Violence: Not on file    Review of Systems  All other systems reviewed and are negative.   PHYSICAL EXAMINATION:    BP 128/84   Pulse 78   Ht 5\' 5"  (1.651 m)   Wt (!) 305 lb (138.3 kg)   SpO2 100%   BMI 50.75 kg/m     General appearance: alert, cooperative and appears stated age     ASSESSMENT  Postmenopausal bleeding versus abnormal perimenopausal bleeding.  Elevated FSH but did have an estradiol level that was more perimenopausal in May, 2023.  Hx proliferative endometrium and benign endometrial polyp Desires surgical management with RLH/BSO   PLAN Symptomatic fibroid uterus:  Counseled on all options.  She would like to have the [facility].  Counseled extensively on the procedure including but not limited to what to expect and risks and benefits.  Counseled on postop care and pelvic rest for 10 weeks after the surgery with restricted lifting for 6 weeks after.  Counseled on the benefits of the robotic procedure with faster return to daily activities, improved outcomes, and less risk for complications. She would like to have this scheduled.  Counseled on the r/b/a/I of HRT use. To get a hormone panel.  Discussed that she will need progesterone to avoid unopposed estrogen on the endometrial lining and risk for endometrial cancer.  She can stop aygestin after the [facility]. Discussed lower risk for DVT and stroke with the patch. Side effects include risk of breast tenderness and spotting along with low risk of blood clots and stroke with uncontrolled hypertension. Counseled on the benefits to help improve the bone density during menopause.  TO begin low dose patch now. Weight loss: to see Asher Muir for consult.  30 minutes spent on reviewing records, imaging,  and  one on one Renee Thomas time and counseling Renee Thomas and documentation Dr. Karma Greaser

## 2023-08-27 NOTE — Patient Instructions (Signed)
Robotic Laparoscopic Hysterectomy, Care After  The following information offers guidance on how to care for yourself after your procedure. Your health care provider may also give you more specific instructions. If you have problems or questions, contact your health care provider. What can I expect after the procedure? After the procedure, it is common to have: Pain, bruising, and numbness around your incisions. Tiredness (fatigue). Poor appetite. Less interest in sex. Vaginal discharge or bleeding. You will need to use a sanitary pad after this procedure.  HEAVY BLEEDING LIKE A PERIOD IS NOT NORMAL.  PLEASE CALL YOUR PROVIDER IF SOAKING A PAD. Feelings of sadness or other emotions. If your ovaries were also removed, it is also common to have symptoms of menopause, such as hot flashes, night sweats, and lack of sleep (insomnia).  Ovaries should stay in if at all possible until at least the age of 30. Follow these instructions at home: Medicines Take over-the-counter and prescription medicines only as told by your health care provider. Ask your health care provider if the medicine prescribed to you: Requires you to avoid driving or using machinery. Can cause constipation. You may need to take these actions to prevent or treat constipation: Drink enough fluid to keep your urine pale yellow. Take over-the-counter or prescription medicines. Eat foods that are high in fiber, such as beans, whole grains, and fresh fruits and vegetables. Limit foods that are high in fat and processed sugars, such as fried or sweet foods.  Also, avoid spicy foods.  NAUSEA IS COMMON THE FIRST NIGHT OF SURGERY.  IF IT LASTS BEYOND 24 HOURS, CALL YOUR PROVIDER.  NAUSEA MEDICATION WAS GIVEN AT YOUR PREOP APPOINTMENT THAT YOU CAN TAKE AFTER SURGERY. Incision care  Follow instructions from your health care provider about how to take care of your incisions. Make sure you: LEAVE INCISION OPEN AND DRY-NO BANDAGES Leave  stitches (sutures), skin glue, or adhesive strips in place UNTIL 2 WEEKS THEN REMOVE IN THE SHOWER.  If adhesive strip edges start to loosen and curl up, you may trim the loose edges. Check your incision areas every day for signs of infection. Check for: More redness, swelling, or pain. Fluid or blood. Warmth. Pus or a bad smell. Activity  Rest as told by your health care provider. Avoid sitting for a long time without moving. Get up to take short walks every 1-2 hours. This is important to improve blood flow and breathing. Ask for help if you feel weak or unsteady. Return to your normal activities as told by your health care provider. Ask your health care provider what activities are safe for you. Do not lift anything that is heavier than 10 lb (4.5 kg), or the limit that you are told, for 8 WEEKS after surgery or until your health care provider says that it is safe. If you were given a sedative during the procedure, it can affect you for several hours. Do not drive or operate machinery until your health care provider says that it is safe. Lifestyle Do not use any products that contain nicotine or tobacco. These products include cigarettes, chewing tobacco, and vaping devices, such as e-cigarettes. These can delay healing after surgery. If you need help quitting, ask your health care provider. Do not drink alcohol until your health care provider approves. General instructions FOR 2 WEEKS AFTER SURGERY, THEN YOU MAY USE TUBS AND HOT TUBS Do not douche, use tampons, or have sex for at least 10 weeks, or as told by your health care  provider. If you struggle with physical or emotional changes after your procedure, speak with your health care provider or a therapist. Do not take baths, swim, or use a hot tub until your health care provider approves. You may only be allowed to take showers for 2 weeks. IF YOU HAVE BURNING WITH URINATION, PLEASE CALL YOUR DOCTOR. BLADDER INFECTIONS MAY OCCUR AFTER  SURGERY Try to have someone at home with you for the first 1-2 weeks to help with your daily chores. Wear compression stockings as told by your health care provider. These stockings help to prevent blood clots and reduce swelling in your legs. Keep all follow-up visits. This is important. Contact a health care provider if: You have any of these signs of infection: Chills or a fever 125f OR GREATER. More redness, swelling, or pain around an incision. Fluid or blood coming from an incision. Warmth coming from an incision. Pus or a bad smell coming from an incision. Burning with urination. Urinary frequency or cramping.   IF YOU HAVE THESE SYMPTOMS, PLEASE CALL THE OFFICE TO COME EVALUATE FOR A BLADDER INFECTION AT [phone] An incision opens. You feel dizzy or light-headed. You have pain or bleeding when you urinate, or you are unable to urinate. You have abnormal vaginal discharge. You have pain that does not get better with medicine. Get help right away if: You have a fever and your symptoms suddenly get worse. You have severe abdominal pain. You have chest pain or shortness of breath. You may have chest pain and shortness of breath from the CO2 gas for a few days after surgery.  This is very common.  Walking, Gas-X and motrin will usually help relieve this discomfort You faint. You have pain, swelling, or redness in your leg. You have heavy vaginal bleeding with blood clots, soaking through a sanitary pad in less than 1 hour. These symptoms may represent a serious problem that is an emergency. Do not wait to see if the symptoms will go away. Get medical help right away. Call your local emergency services (911 in the [place] Do not drive yourself to the hospital. Summary  CONSTIPATION MEDICATION AFTER SURGERY: COLACE, MOM, MIRALAX, GAS X are all helpful to have on hand, if needed.  FILL ALL POSTOP MEDICATION BEFORE SURGERY  After the procedure, it is common to have pain and bruising  around your incisions. Do not take baths, swim, or use a hot tub until your health care provider approves. Do not lift anything that is heavier than 8- 10 lb (4.5 kg), or the limit that you are told, for one month after surgery or until your health care provider says that it is safe. Tell your health care provider if you have any signs or symptoms of infection after the procedure. Get help right away if you have severe abdominal pain, chest pain, shortness of breath, or heavy bleeding from your vagina. This information is not intended to replace advice given to you by your  health care provider. Make sure you discuss any questions you have with your health care provider. Document Revised: 06/24/2020 Document Reviewed: 06/25/2020 Elsevier Patient Education  2024 [employer].

## 2023-08-27 NOTE — H&P (View-Only) (Signed)
PREOP H&P GYNECOLOGY  VISIT   HPI: Patient presents for surgical consult from Dr. Edward Jolly for RLH/BSO/cystoscopy Per her visit: 52 y.o.   Single  African American  female   [id number] with No LMP recorded. Patient is perimenopausal.   here for   4 week med f/u and possible endo bx.  Patient has been followed for irregular bleeding.   She stopped having menses in 2020.  [facility] 09/26/19 was 10.7.  She had sporadic periods following this, which prompted evaluation for potential postmenopausal bleeding.   Her first endometrial biopsy was in 05/11/22 showed a benign polyp and proliferative endometrium.  A pelvic US the same day was unremarkable.  EMS 2.76 mm, normal uterus, normal ovaries.   She developed bleeding following the biopsy, and she was started on Provera.  She had a hysteroscopy with dilation and curettage 07/11/22 and her final pathology showed a benign polyp and inactive endometrium.   She has been treated post surgery with Provera or Aygestin to control her bleeding.  Her last endometrial biopsy was performed 01/17/23 and showed benign inactive endometrium, no evidence of hyperplasia, atypia, or malignancy.    Sonohysterogram performed 03/15/23 due to ongoing bleeding, and this showed no myometrial masses, EMS 2.9 with no endometrial masses, and normal ovaries.   She is currently on Aygestin 5 mg po bid for 2 months.  Bleeding stopped right after starting the medication.    Some vulvar itching that comes and goes.  She wears Poise pads due to urinary incontinence.   Labs 03/16/21:  FSH 61.0, estradiol 16. Labs 03/23/22:  FSH 44.8, estradiol 26. Labs 06/14/22:  FSH 42.8, estradiol 17.  Not sexually active for years.  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  Reports hot flashes, fatigue, weight gain She would like a definitive management for the bleeding and would like to proceed with the RLH/BSO   US Transvaginal Non-OB (Accession [id number]) (Order [id number]) Imaging Date: 03/15/2023  Department: [facility] Released By: Lillia Carmel, CMA Authorizing: Patton Salles, MD   Exam Status  Status  Final [99]   PACS Intelerad Image Link   Show images for US Transvaginal Non-OB Related Results   [place] Sonohysterogram Final result 03/15/2023 11:00 AM    Narrative  Indication:  Postmenopausal bleeding, proliferative endometrium treated with daily progesterone, history of endometrial polyp.  Followed with periodic endometrial biopsies.  Findings: Pelvic US Uterus 7.81 x 4.66 x 4.39 cm. No myometrial masses. EMS 2.90 mm. Left ovary 1.91 x 1.29 x 1.31 cm.  ...       Study Result  Narrative & Impression  Indication:  Postmenopausal bleeding, proliferative endometrium treated with daily progesterone, history of endometrial polyp.  Followed with periodic endometrial biopsies.   Findings: Pelvic US Uterus 7.81 x 4.66 x 4.39 cm.  No myometrial masses. EMS 2.90 mm.  Left ovary 1.91 x 1.29 x 1.31 cm.  Right ovary 2.22 x 1.68 x 1.50 cm.  No adnexal masses.  No free fluid.    Sonohysterogram Consent done.  Hibiclens prep.  NS injected in uterine cavity with sterile cannula.  No filling defects noted.  Minimal EBL.  No complications.        GYNECOLOGIC HISTORY: No LMP recorded. Patient is perimenopausal. Contraception:  peri/ aygestin Menopausal hormone therapy:  n/a Last mammogram:  08/16/22 Breast Density Cat A, BI-RADS CAT 1 neg  Last pap smear:   03/23/22 neg: HR HPV neg, 1//20/20 neg  OB History     Gravida  0   Para  0   Term  0   Preterm  0   AB  0   Living  0      SAB  0   IAB  0   Ectopic  0   Multiple  0   Live Births                 Patient Active Problem List   Diagnosis Date Noted  . Neck pain 02/02/2022  . GAD (generalized anxiety disorder) 12/16/2021  . Hypertension 09/10/2020  . GERD without esophagitis 05/02/2019  . BMI 45.0-49.9, adult (HCC) 05/02/2019  . Low  serum vitamin B12 11/17/2016    Past Medical History:  Diagnosis Date  . Anemia   . Arthritis    neck, right foot, left shoulder  . Balance disorder    06-27-2022  per pt at times "feels off balance without dizziness" , stated has had neurology/ cardiology/ and ENT work-up without etiology  . Endometrial polyp   . GERD (gastroesophageal reflux disease)   . Hypertension   . MVP (mitral valve prolapse)    no evidence per echo in epic 10-21-2020, has trivial mitral valve regurg  . OSA on CPAP 10/2021   followed by neurologist--- dr Frances Furbish;  sleep study in epic 10-26-2021 severe osa ( 06-27-2022 per pt uses apap nightly)  . PMB (postmenopausal bleeding)   . Pre-diabetes   . PVC's (premature ventricular contractions)    followed by cardiologist--- dr branch;  last event monitor 11-11-2020  frequent PVCs (6%), NSR w/ rare SVT;   low risk nuclear study 10-21-2020 w/ no ischemia  . Vitamin B 12 deficiency   . Vitamin D deficiency 2022  . Wears glasses     Past Surgical History:  Procedure Laterality Date  . DILATATION & CURETTAGE/HYSTEROSCOPY WITH MYOSURE N/A 07/11/2022   Procedure: DILATATION & CURETTAGE/HYSTEROSCOPY WITH MYOSURE RESECTION OF ENDOMETRIAL POLYPS;  Surgeon: Patton Salles, MD;  Location: [facility] [place];  Service: Gynecology;  Laterality: N/A;  . NO PAST SURGERIES      Current Outpatient Medications  Medication Sig Dispense Refill  . acetaminophen (TYLENOL) 650 MG CR tablet Take 1,300 mg by mouth daily.    Marland Kitchen amLODipine (NORVASC) 2.5 MG tablet Take 1 tablet (2.5 mg total) by mouth daily. 90 tablet 1  . Carboxymethylcellul-Glycerin (CLEAR EYES FOR DRY EYES OP) Place 1-2 drops into both eyes daily as needed (dry eyes).    . Cholecalciferol (VITAMIN D3) 50 MCG (2000 UT) TABS Take 1 tablet by mouth daily after lunch.    . Cyanocobalamin (B-12 COMPLIANCE INJECTION IJ) Inject as directed every 30 (thirty) days.    Marland Kitchen escitalopram (LEXAPRO) 10 MG tablet Take  1 tablet (10 mg total) by mouth daily. 90 tablet 1  . estradiol (DOTTI) 0.0375 MG/24HR Place 1 patch onto the skin 2 (two) times a week. 8 patch 12  . fexofenadine (ALLEGRA) 180 MG tablet TAKE 1 TABLET BY MOUTH EVERY DAY 90 tablet 1  . fluticasone (FLONASE) 50 MCG/ACT nasal spray SPRAY 2 SPRAYS INTO EACH NOSTRIL EVERY DAY (Patient taking differently: Place 2 sprays into both nostrils daily as needed for allergies.) 48 mL 1  . hydrochlorothiazide (HYDRODIURIL) 25 MG tablet Take 1 tablet (25 mg total) by mouth daily. 90 tablet 1  . KRILL OIL PO Take 350 mg by mouth daily.     . metoprolol tartrate (LOPRESSOR) 25 MG tablet TAKE  1/2 TABLET (12.5 MG TOTAL) BY MOUTH 2 (TWO) TIMES DAILY AS NEEDED (PALPITATIONS). 90 tablet 1  . norethindrone (AYGESTIN) 5 MG tablet TAKE ONE TABLET (5 MG) BY MOUTH TWICE DAILY. 30 tablet 0  . nystatin (MYCOSTATIN/NYSTOP) powder Apply 1 Application topically 3 (three) times daily. Apply to affected area for up to 7 days 30 g 2  . omeprazole (PRILOSEC) 20 MG capsule Take 1 capsule (20 mg total) by mouth daily. 90 capsule 1  . Probiotic Product (PROBIOTIC PO) Take by mouth daily.    . rosuvastatin (CRESTOR) 10 MG tablet Take 1 tablet (10 mg total) by mouth daily. 90 tablet 1  . ibuprofen (ADVIL) 600 MG tablet Take 1 tablet (600 mg total) by mouth every 6 (six) hours as needed. (Patient not taking: Reported on 08/27/2023) 30 tablet 0   Current Facility-Administered Medications  Medication Dose Route Frequency Provider Last Rate Last Admin  . cyanocobalamin (VITAMIN B12) injection 1,000 mcg  1,000 mcg Intramuscular Q30 days Bennie Pierini, FNP   1,000 mcg at 07/30/23 0908     ALLERGIES: Egg-derived products, Fluarix [influenza virus vaccine], Other, Peanut-containing drug products, Penicillins, Apple, Blueberry flavor, Kiwi extract, Watermelon flavor, Amoxicillin, and Sulfa antibiotics  Family History  Problem Relation Age of Onset  . Hypertension Mother   . Diabetes  Mother   . Other Mother        renal disease  . Hypertension Father   . Heart attack Father   . Hypertension Sister   . Diabetes Brother   . Diabetes Brother   . Sleep apnea Neg Hx     Social History   Socioeconomic History  . Marital status: Single    Spouse name: Not on file  . Number of children: Not on file  . Years of education: Not on file  . Highest education level: 12th grade  Occupational History  . Not on file  Tobacco Use  . Smoking status: Never  . Smokeless tobacco: Never  Vaping Use  . Vaping status: Never Used  Substance and Sexual Activity  . Alcohol use: No    Alcohol/week: 0.0 standard drinks of alcohol  . Drug use: Never  . Sexual activity: Not Currently    Partners: Male    Birth control/protection: Abstinence  Other Topics Concern  . Not on file  Social History Narrative  . Not on file   Social Determinants of Health   Financial Resource Strain: Low Risk  (03/10/2023)   Overall Financial Resource Strain (CARDIA)   . Difficulty of Paying Living Expenses: Not hard at all  Food Insecurity: No Food Insecurity (03/10/2023)   Hunger Vital Sign   . Worried About Programme researcher, broadcasting/film/video in the Last Year: Never true   . Ran Out of Food in the Last Year: Never true  Transportation Needs: No Transportation Needs (03/10/2023)   PRAPARE - Transportation   . Lack of Transportation (Medical): No   . Lack of Transportation (Non-Medical): No  Physical Activity: Unknown (03/10/2023)   Exercise Vital Sign   . Days of Exercise per Week: 0 days   . Minutes of Exercise per Session: Not on file  Stress: Stress Concern Present (03/10/2023)   [employer] of Occupational Health - Occupational Stress Questionnaire   . Feeling of Stress : To some extent  Social Connections: Socially Isolated (03/10/2023)   Social Connection and Isolation Panel [NHANES]   . Frequency of Communication with Friends and Family: More than three times a week   .  Frequency of Social Gatherings  with Friends and Family: Once a week   . Attends Religious Services: Never   . Active Member of Clubs or Organizations: No   . Attends Banker Meetings: Not on file   . Marital Status: Never married  Intimate Partner Violence: Not on file    Review of Systems  All other systems reviewed and are negative.   PHYSICAL EXAMINATION:    BP 128/84   Pulse 78   Ht 5\' 5"  (1.651 m)   Wt (!) 305 lb (138.3 kg)   SpO2 100%   BMI 50.75 kg/m     General appearance: alert, cooperative and appears stated age     ASSESSMENT  Postmenopausal bleeding versus abnormal perimenopausal bleeding.  Elevated FSH but did have an estradiol level that was more perimenopausal in May, 2023.  Hx proliferative endometrium and benign endometrial polyp Desires surgical management with RLH/BSO   PLAN Symptomatic fibroid uterus:  Counseled on all options.  She would like to have the [facility].  Counseled extensively on the procedure including but not limited to what to expect and risks and benefits.  Counseled on postop care and pelvic rest for 10 weeks after the surgery with restricted lifting for 6 weeks after.  Counseled on the benefits of the robotic procedure with faster return to daily activities, improved outcomes, and less risk for complications. She would like to have this scheduled.  Counseled on the r/b/a/I of HRT use. To get a hormone panel.  Discussed that she will need progesterone to avoid unopposed estrogen on the endometrial lining and risk for endometrial cancer.  She can stop aygestin after the [facility]. Discussed lower risk for DVT and stroke with the patch. Side effects include risk of breast tenderness and spotting along with low risk of blood clots and stroke with uncontrolled hypertension. Counseled on the benefits to help improve the bone density during menopause.  TO begin low dose patch now. Weight loss: to see Asher Muir for consult.  30 minutes spent on reviewing records, imaging,  and  one on one patient time and counseling patient and documentation Dr. Karma Greaser

## 2023-08-27 NOTE — Plan of Care (Signed)
[facility] Tonsillectomy/Adenoidectomy, Postoperative PEDS care plan entered in error.

## 2023-08-28 ENCOUNTER — Other Ambulatory Visit: Payer: Self-pay

## 2023-08-28 MED ORDER — NORETHINDRONE ACETATE 5 MG PO TABS: ORAL_TABLET | ORAL | 0 refills | Status: DC

## 2023-08-28 NOTE — Telephone Encounter (Signed)
Medication refill request: agystin  Last ov:  08/27/23 Next AEX: has surgery scheduled for 09/07/23 Last MMG (if hormonal medication request): n/a Refill authorized: #30 with 0 rf

## 2023-08-29 ENCOUNTER — Encounter: Payer: Self-pay | Admitting: Obstetrics and Gynecology

## 2023-08-29 ENCOUNTER — Ambulatory Visit (INDEPENDENT_AMBULATORY_CARE_PROVIDER_SITE_OTHER): Payer: Medicare HMO | Admitting: Obstetrics and Gynecology

## 2023-08-29 ENCOUNTER — Ambulatory Visit: Payer: Medicare HMO

## 2023-08-29 VITALS — BP 128/86 | HR 95 | Ht 66.5 in | Wt 306.0 lb

## 2023-08-29 DIAGNOSIS — B3731 Acute candidiasis of vulva and vagina: Secondary | ICD-10-CM | POA: Diagnosis not present

## 2023-08-29 DIAGNOSIS — Z01419 Encounter for gynecological examination (general) (routine) without abnormal findings: Secondary | ICD-10-CM | POA: Diagnosis not present

## 2023-08-29 DIAGNOSIS — N898 Other specified noninflammatory disorders of vagina: Secondary | ICD-10-CM

## 2023-08-29 LAB — WET PREP FOR TRICH, YEAST, CLUE

## 2023-08-29 MED ORDER — FLUCONAZOLE 150 MG PO TABS: 150.0000 mg | ORAL_TABLET | Freq: Once | ORAL | 0 refills | Status: AC

## 2023-08-29 MED ORDER — IBUPROFEN 800 MG PO TABS: 800.0000 mg | ORAL_TABLET | Freq: Three times a day (TID) | ORAL | 1 refills | Status: DC | PRN

## 2023-08-29 MED ORDER — OXYCODONE HCL 5 MG PO TABS
5.0000 mg | ORAL_TABLET | ORAL | 0 refills | Status: DC | PRN
Start: 2023-08-29 — End: 2023-09-26

## 2023-08-29 MED ORDER — METOCLOPRAMIDE HCL 10 MG PO TABS: 10.0000 mg | ORAL_TABLET | Freq: Three times a day (TID) | ORAL | 0 refills | Status: DC | PRN

## 2023-08-29 NOTE — Patient Instructions (Signed)

## 2023-08-30 DIAGNOSIS — Z008 Encounter for other general examination: Secondary | ICD-10-CM | POA: Diagnosis not present

## 2023-08-30 DIAGNOSIS — H269 Unspecified cataract: Secondary | ICD-10-CM | POA: Diagnosis not present

## 2023-08-30 DIAGNOSIS — R609 Edema, unspecified: Secondary | ICD-10-CM | POA: Diagnosis not present

## 2023-08-30 DIAGNOSIS — J302 Other seasonal allergic rhinitis: Secondary | ICD-10-CM | POA: Diagnosis not present

## 2023-08-30 DIAGNOSIS — Z880 Allergy status to penicillin: Secondary | ICD-10-CM | POA: Diagnosis not present

## 2023-08-30 DIAGNOSIS — F411 Generalized anxiety disorder: Secondary | ICD-10-CM | POA: Diagnosis not present

## 2023-08-30 DIAGNOSIS — I10 Essential (primary) hypertension: Secondary | ICD-10-CM | POA: Diagnosis not present

## 2023-08-30 DIAGNOSIS — Z8249 Family history of ischemic heart disease and other diseases of the circulatory system: Secondary | ICD-10-CM | POA: Diagnosis not present

## 2023-08-30 DIAGNOSIS — R32 Unspecified urinary incontinence: Secondary | ICD-10-CM | POA: Diagnosis not present

## 2023-08-30 DIAGNOSIS — I499 Cardiac arrhythmia, unspecified: Secondary | ICD-10-CM | POA: Diagnosis not present

## 2023-08-30 DIAGNOSIS — E785 Hyperlipidemia, unspecified: Secondary | ICD-10-CM | POA: Diagnosis not present

## 2023-08-30 DIAGNOSIS — K219 Gastro-esophageal reflux disease without esophagitis: Secondary | ICD-10-CM | POA: Diagnosis not present

## 2023-08-31 ENCOUNTER — Other Ambulatory Visit: Payer: Self-pay | Admitting: Nurse Practitioner

## 2023-08-31 DIAGNOSIS — E7800 Pure hypercholesterolemia, unspecified: Secondary | ICD-10-CM

## 2023-09-03 ENCOUNTER — Ambulatory Visit (INDEPENDENT_AMBULATORY_CARE_PROVIDER_SITE_OTHER): Payer: Medicare HMO

## 2023-09-03 ENCOUNTER — Encounter (HOSPITAL_BASED_OUTPATIENT_CLINIC_OR_DEPARTMENT_OTHER): Payer: Self-pay | Admitting: Obstetrics and Gynecology

## 2023-09-03 DIAGNOSIS — E538 Deficiency of other specified B group vitamins: Secondary | ICD-10-CM

## 2023-09-03 NOTE — Progress Notes (Signed)
Your procedure is scheduled on   Friday,  09-07-2023  Report to [facility] AT  __6:30 _ AM.   Call this number if you have problems the morning of surgery  :[phone].   OUR ADDRESS IS [address].  [facility] LOCATED IN [facility].  PLEASE BRING YOUR INSURANCE CARD AND PHOTO ID DAY OF SURGERY. Also,  bring your cpap/ mask/ and tubing with day of surgery.   ONLY 2 PEOPLE ARE ALLOWED IN  WAITING  ROOM                                      REMEMBER:  DO NOT EAT FOOD, CANDY GUM OR MINTS  AFTER MIDNIGHT THE NIGHT BEFORE YOUR SURGERY . YOU MAY HAVE CLEAR LIQUIDS FROM MIDNIGHT THE NIGHT BEFORE YOUR SURGERY UNTIL  5:30 AM_. NO CLEAR LIQUIDS AFTER   5:30 AM_ DAY OF SURGERY.  YOU MAY  BRUSH YOUR TEETH MORNING OF SURGERY AND RINSE YOUR MOUTH OUT, NO CHEWING GUM CANDY OR MINTS.     CLEAR LIQUID DIET  Allowed      Water                                                                   Coffee and tea, regular and decaf  (NO cream or milk products of any type, may sweeten)                         Carbonated beverages, regular and diet                                    Sports drinks like Gatorade _____________________________________________________________________     TAKE ONLY THESE MEDICATIONS MORNING OF SURGERY:  Fexofenadine (allegra),  Rosuvastatin (crestor),  Metoprolol (Lopressor),  Amlodipine (norvasc),  Northindrone,  Omeprazole (prilosec)                                        DO NOT WEAR JEWERLY/  METAL/  PIERCINGS (INCLUDING NO PLASTIC PIERCINGS) DO NOT WEAR LOTIONS, POWDERS, PERFUMES OR NAIL POLISH ON YOUR FINGERNAILS. TOENAIL POLISH IS OK TO WEAR. DO NOT SHAVE FOR 48 HOURS PRIOR TO DAY OF SURGERY.  CONTACTS, GLASSES, OR DENTURES MAY NOT BE WORN TO SURGERY.  REMEMBER: NO SMOKING, VAPING ,  DRUGS OR ALCOHOL FOR 24 HOURS BEFORE YOUR SURGERY.                                    [facility] IS NOT RESPONSIBLE  FOR ANY BELONGINGS.                                                                     [facility]  [facility] - Preparing for Surgery Before surgery, you can play an important role.  Because skin is not sterile, your skin needs to be as free of germs as possible.  You can reduce the number of germs on your skin by washing with CHG (chlorahexidine gluconate) soap before surgery.  CHG is an antiseptic cleaner which kills germs and bonds with the skin to continue killing germs even after washing. Please DO NOT use if you have an allergy to CHG or antibacterial soaps.  If your skin becomes reddened/irritated stop using the CHG and inform your nurse when you arrive at Short Stay. Do not shave (including legs and underarms) for at least 48 hours prior to the first CHG shower.  You may shave your face/neck. Please follow these instructions carefully:  1.  Shower with CHG Soap the night before surgery and the  morning of Surgery.  2.  If you choose to wash your hair, wash your hair first as usual with your  normal  shampoo.  3.  After you shampoo, rinse your hair and body thoroughly to remove the  shampoo.                                        4.  Use CHG as you would any other liquid soap.  You can apply chg directly  to the skin and wash , chg soap provided, night before and morning of your surgery.  5.  Apply the CHG Soap to your body ONLY FROM THE NECK DOWN.   Do not use on face/ open                           Wound or open sores. Avoid contact with eyes, ears mouth and genitals (private parts).                       Wash face,  Genitals (private parts) with your normal soap.             6.  Wash thoroughly, paying special attention to the area where your surgery  will be performed.  7.  Thoroughly rinse your body with warm water from the neck down.  8.  DO NOT shower/wash with your normal soap after using and rinsing off  the CHG Soap.             9.  Pat yourself dry with a clean towel.            10.  Wear clean  pajamas.            11.  Place clean sheets on your bed the night of your first shower and do not  sleep with pets. Day of Surgery : Do not apply any lotions/ powders the morning of surgery.  Please wear clean clothes to the hospital/surgery center.  IF YOU HAVE ANY SKIN IRRITATION OR PROBLEMS WITH THE SURGICAL SOAP, PLEASE GET A BAR OF GOLD DIAL SOAP AND SHOWER THE NIGHT BEFORE YOUR SURGERY AND THE MORNING OF YOUR SURGERY. PLEASE LET THE NURSE KNOW MORNING OF YOUR SURGERY IF YOU HAD ANY PROBLEMS WITH THE SURGICAL SOAP.   YOUR SURGEON MAY HAVE REQUESTED EXTENDED RECOVERY TIME AFTER YOUR SURGERY. IT COULD BE A  JUST A FEW HOURS  UP TO AN OVERNIGHT STAY.  YOUR SURGEON SHOULD HAVE DISCUSSED  THIS WITH YOU PRIOR TO YOUR SURGERY. IN THE EVENT YOU NEED TO STAY OVERNIGHT PLEASE REFER TO THE FOLLOWING GUIDELINES. YOU MAY HAVE UP TO 4 VISITORS  MAY VISIT IN THE EXTENDED RECOVERY ROOM UNTIL 800 PM ONLY.  ONE  VISITOR AGE 54 AND OVER MAY SPEND THE NIGHT AND MUST BE IN EXTENDED RECOVERY ROOM NO LATER THAN 800 PM . YOUR DISCHARGE TIME AFTER YOU SPEND THE NIGHT IS 900 AM THE MORNING AFTER YOUR SURGERY. YOU MAY PACK A SMALL OVERNIGHT BAG WITH TOILETRIES FOR YOUR OVERNIGHT STAY IF YOU WISH.  REGARDLESS OF IF YOU STAY OVER NIGHT OR ARE DISCHARGED THE SAME DAY YOU WILL BE REQUIRED TO HAVE A RESPONSIBLE ADULT (18 YRS OLD OR OLDER) STAY WITH YOU FOR AT LEAST THE FIRST 24 HOURS  YOUR PRESCRIPTION MEDICATIONS WILL BE PROVIDED DURING YOUR HOSPITAL STAY.  ________________________________________________________________________                                                        QUESTIONS Olegario Shearer PRE OP NURSE PHONE [phone].

## 2023-09-03 NOTE — Progress Notes (Addendum)
Addendum:   Chart reviewed w/ anesthesia , Renee Joline Salt MDA, via phone.  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  And for BMI of 50.92 since pt has surgery here @ [facility] 07-11-2022 had airway check at that time with no issues with surgery, ok to proceed without pt coming in for another airway check.  Spoke w/ via phone for pre-op interview--- pt Lab needs dos---- no         Lab results------ pt has pat lab appt 10/29 @ 0830 fir CBC/ T&S/ BMP/ EKG COVID test -----patient states asymptomatic no test needed Arrive at -------  0630 on 09-07-2023 NPO after MN NO Solid Food.  Clear liquids from MN until--- 0530 Med rec completed Medications to take morning of surgery ----- allegra, crestor, lopressor, norvasc, norethindrone, prilosec Diabetic medication ----- n/a Patient instructed no nail polish to be worn day of surgery Patient instructed to bring photo id and insurance card day of surgery Patient aware to have Driver (ride ) / caregiver    for 24 hours after surgery - sister, Renee Thomas Patient Special Instructions -----  reviewed [facility] / visitor guidelines.  Pt will pick bag w/ hibiclens soap and written instructions for dos at lap appt Pre-Op special Instructions ----- sent inbox message to Renee boswell , requested orders Patient verbalized understanding of instructions that were given at this phone interview. Patient denies chest pain, sob, fever, cough at the interview.    Anesthesia Review:  HTN;  PVCs;  severe OSA (pt stated uses cpap nightly);  pre-diabetic;  BMI 50.92 (5\' 5"  138.8kg actual wt) last airway check @[facility]  surgery 07-11-2022 Pt denies cardiac s&s, no sob, and no peripheral swelling.  Also denies dizziness/ lightedness/ and no near syncope/ syncope  PCP:  Renee Daphine Deutscher NP (lov  03-12-2023) Cardiologist :  Renee Thomas 07-28-2022,  pt stated she had not received notice from office to schedule annul follow-up yet) EKG :  07-11-2022 Echo : 10-21-2020 Event monitor:  11-12-2019 Stress test:  10-21-2020 Cardiac Cath :  no Activity level:  denies sob w/ any activity Sleep Study/ CPAP : yes/ yes Blood Thinner/ Instructions /Last Dose: no ASA / Instructions/ Last Dose : no

## 2023-09-03 NOTE — Progress Notes (Signed)
Cyanocobalamin injection given to left deltoid.  Patient tolerated well. 

## 2023-09-04 ENCOUNTER — Encounter (HOSPITAL_COMMUNITY)
Admission: RE | Admit: 2023-09-04 | Discharge: 2023-09-04 | Discharge status: Home / Self Care | Payer: Medicare HMO | Source: Ambulatory Visit | Attending: Obstetrics and Gynecology

## 2023-09-04 DIAGNOSIS — Z01818 Encounter for other preprocedural examination: Secondary | ICD-10-CM | POA: Diagnosis not present

## 2023-09-04 DIAGNOSIS — R9431 Abnormal electrocardiogram [ECG] [EKG]: Secondary | ICD-10-CM | POA: Insufficient documentation

## 2023-09-04 DIAGNOSIS — Z01812 Encounter for preprocedural laboratory examination: Secondary | ICD-10-CM | POA: Diagnosis not present

## 2023-09-04 DIAGNOSIS — Z01810 Encounter for preprocedural cardiovascular examination: Secondary | ICD-10-CM | POA: Diagnosis not present

## 2023-09-04 LAB — CBC
HCT: 36.6 % (ref 36.0–46.0)
Hemoglobin: 11.5 g/dL — ABNORMAL LOW (ref 12.0–15.0)
MCH: 26 pg (ref 26.0–34.0)
MCHC: 31.4 g/dL (ref 30.0–36.0)
MCV: 82.6 fL (ref 80.0–100.0)
Platelets: 471 10*3/uL — ABNORMAL HIGH (ref 150–400)
RBC: 4.43 MIL/uL (ref 3.87–5.11)
RDW: 17 % — ABNORMAL HIGH (ref 11.5–15.5)
WBC: 5.8 10*3/uL (ref 4.0–10.5)
nRBC: 0 % (ref 0.0–0.2)

## 2023-09-04 LAB — TYPE AND SCREEN
ABO/RH(D): AB POS
Antibody Screen: NEGATIVE

## 2023-09-04 LAB — BASIC METABOLIC PANEL
Anion gap: 8 (ref 5–15)
BUN: 21 mg/dL — ABNORMAL HIGH (ref 6–20)
CO2: 22 mmol/L (ref 22–32)
Calcium: 9.1 mg/dL (ref 8.9–10.3)
Chloride: 104 mmol/L (ref 98–111)
Creatinine, Ser: 0.56 mg/dL (ref 0.44–1.00)
GFR, Estimated: >60 mL/min (ref >60)
Glucose, Bld: 122 mg/dL — ABNORMAL HIGH (ref 70–99)
Potassium: 3.7 mmol/L (ref 3.5–5.1)
Sodium: 134 mmol/L — ABNORMAL LOW (ref 135–145)

## 2023-09-06 DIAGNOSIS — N8003 Adenomyosis of the uterus: Secondary | ICD-10-CM

## 2023-09-06 DIAGNOSIS — N950 Postmenopausal bleeding: Secondary | ICD-10-CM | POA: Diagnosis not present

## 2023-09-06 DIAGNOSIS — D259 Leiomyoma of uterus, unspecified: Secondary | ICD-10-CM | POA: Diagnosis not present

## 2023-09-07 ENCOUNTER — Ambulatory Visit (HOSPITAL_BASED_OUTPATIENT_CLINIC_OR_DEPARTMENT_OTHER)
Admission: RE | Admit: 2023-09-07 | Discharge: 2023-09-07 | Disposition: A | Discharge status: Home / Self Care | Payer: Medicare HMO | Attending: Obstetrics and Gynecology | Admitting: Obstetrics and Gynecology

## 2023-09-07 ENCOUNTER — Encounter (HOSPITAL_BASED_OUTPATIENT_CLINIC_OR_DEPARTMENT_OTHER)
Admission: RE | Disposition: A | Discharge status: Home / Self Care | Payer: Self-pay | Source: Home / Self Care | Attending: Obstetrics and Gynecology

## 2023-09-07 ENCOUNTER — Other Ambulatory Visit: Payer: Self-pay

## 2023-09-07 ENCOUNTER — Encounter (HOSPITAL_BASED_OUTPATIENT_CLINIC_OR_DEPARTMENT_OTHER): Payer: Self-pay | Admitting: Obstetrics and Gynecology

## 2023-09-07 ENCOUNTER — Ambulatory Visit (HOSPITAL_BASED_OUTPATIENT_CLINIC_OR_DEPARTMENT_OTHER): Payer: Medicare HMO | Admitting: Certified Registered Nurse Anesthetist

## 2023-09-07 DIAGNOSIS — N924 Excessive bleeding in the premenopausal period: Secondary | ICD-10-CM | POA: Insufficient documentation

## 2023-09-07 DIAGNOSIS — Z6841 Body Mass Index (BMI) 40.0 and over, adult: Secondary | ICD-10-CM | POA: Diagnosis not present

## 2023-09-07 DIAGNOSIS — K219 Gastro-esophageal reflux disease without esophagitis: Secondary | ICD-10-CM | POA: Diagnosis not present

## 2023-09-07 DIAGNOSIS — N8003 Adenomyosis of the uterus: Secondary | ICD-10-CM | POA: Diagnosis not present

## 2023-09-07 DIAGNOSIS — G4733 Obstructive sleep apnea (adult) (pediatric): Secondary | ICD-10-CM | POA: Insufficient documentation

## 2023-09-07 DIAGNOSIS — I10 Essential (primary) hypertension: Secondary | ICD-10-CM

## 2023-09-07 DIAGNOSIS — D271 Benign neoplasm of left ovary: Secondary | ICD-10-CM | POA: Diagnosis not present

## 2023-09-07 DIAGNOSIS — D270 Benign neoplasm of right ovary: Secondary | ICD-10-CM | POA: Insufficient documentation

## 2023-09-07 DIAGNOSIS — I341 Nonrheumatic mitral (valve) prolapse: Secondary | ICD-10-CM | POA: Insufficient documentation

## 2023-09-07 DIAGNOSIS — D649 Anemia, unspecified: Secondary | ICD-10-CM | POA: Diagnosis not present

## 2023-09-07 DIAGNOSIS — N950 Postmenopausal bleeding: Secondary | ICD-10-CM | POA: Diagnosis not present

## 2023-09-07 DIAGNOSIS — N72 Inflammatory disease of cervix uteri: Secondary | ICD-10-CM | POA: Diagnosis not present

## 2023-09-07 DIAGNOSIS — Z01818 Encounter for other preprocedural examination: Secondary | ICD-10-CM

## 2023-09-07 HISTORY — PX: ROBOTIC ASSISTED TOTAL HYSTERECTOMY WITH BILATERAL SALPINGO OOPHERECTOMY: SHX6086

## 2023-09-07 HISTORY — PX: CYSTOSCOPY: SHX5120

## 2023-09-07 LAB — TYPE AND SCREEN
ABO/RH(D): AB POS
Antibody Screen: NEGATIVE

## 2023-09-07 SURGERY — HYSTERECTOMY, TOTAL, ROBOT-ASSISTED, LAPAROSCOPIC, WITH BILATERAL SALPINGO-OOPHORECTOMY
Anesthesia: General | Site: Pelvis

## 2023-09-07 MED ORDER — ROCURONIUM BROMIDE 10 MG/ML (PF) SYRINGE
PREFILLED_SYRINGE | INTRAVENOUS | Status: DC | PRN
  Administered 2023-09-07: 60 mg via INTRAVENOUS

## 2023-09-07 MED ORDER — LACTATED RINGERS IV SOLN: INTRAVENOUS | Status: DC

## 2023-09-07 MED ORDER — POVIDONE-IODINE 10 % EX SWAB: 2.0000 | Freq: Once | CUTANEOUS | Status: DC

## 2023-09-07 MED ORDER — SODIUM CHLORIDE 0.9 % IV SOLN: 12.5000 mg | INTRAVENOUS | Status: DC | PRN

## 2023-09-07 MED ORDER — HYDROMORPHONE HCL 1 MG/ML IJ SOLN
0.2500 mg | INTRAMUSCULAR | Status: DC | PRN
Start: 2023-09-07 — End: 2023-09-07

## 2023-09-07 MED ORDER — ARTIFICIAL TEARS OPHTHALMIC OINT
TOPICAL_OINTMENT | OPHTHALMIC | Status: AC
  Filled 2023-09-07: qty 3.5

## 2023-09-07 MED ORDER — ONDANSETRON HCL 4 MG/2ML IJ SOLN
INTRAMUSCULAR | Status: DC | PRN
  Administered 2023-09-07: 4 mg via INTRAVENOUS

## 2023-09-07 MED ORDER — KETOROLAC TROMETHAMINE 30 MG/ML IJ SOLN: 30.0000 mg | Freq: Once | INTRAMUSCULAR | Status: DC | PRN

## 2023-09-07 MED ORDER — PROPOFOL 10 MG/ML IV BOLUS
INTRAVENOUS | Status: AC
  Filled 2023-09-07: qty 20

## 2023-09-07 MED ORDER — PROPOFOL 10 MG/ML IV BOLUS
INTRAVENOUS | Status: DC | PRN
  Administered 2023-09-07: 250 mg via INTRAVENOUS

## 2023-09-07 MED ORDER — MIDAZOLAM HCL 2 MG/2ML IJ SOLN
INTRAMUSCULAR | Status: DC | PRN
  Administered 2023-09-07: 2 mg via INTRAVENOUS

## 2023-09-07 MED ORDER — BUPIVACAINE HCL (PF) 0.5 % IJ SOLN
INTRAMUSCULAR | Status: DC | PRN
  Administered 2023-09-07: 25 mL

## 2023-09-07 MED ORDER — DIPHENHYDRAMINE HCL 50 MG/ML IJ SOLN
INTRAMUSCULAR | Status: AC
  Filled 2023-09-07: qty 1

## 2023-09-07 MED ORDER — CLINDAMYCIN PHOSPHATE 900 MG/50ML IV SOLN
900.0000 mg | INTRAVENOUS | Status: AC
  Administered 2023-09-07: 900 mg via INTRAVENOUS

## 2023-09-07 MED ORDER — GABAPENTIN 300 MG PO CAPS
ORAL_CAPSULE | ORAL | Status: AC
  Filled 2023-09-07: qty 1

## 2023-09-07 MED ORDER — MEPERIDINE HCL 25 MG/ML IJ SOLN
6.2500 mg | INTRAMUSCULAR | Status: DC | PRN
Start: 2023-09-07 — End: 2023-09-07

## 2023-09-07 MED ORDER — MIDAZOLAM HCL 2 MG/2ML IJ SOLN
INTRAMUSCULAR | Status: AC
  Filled 2023-09-07: qty 2

## 2023-09-07 MED ORDER — SODIUM CHLORIDE 0.9 % IV SOLN
INTRAVENOUS | Status: AC
  Administered 2023-09-07: 500 mL
  Filled 2023-09-07: qty 6

## 2023-09-07 MED ORDER — ACETAMINOPHEN 500 MG PO TABS
ORAL_TABLET | ORAL | Status: AC
  Filled 2023-09-07: qty 2

## 2023-09-07 MED ORDER — DEXMEDETOMIDINE HCL IN NACL 80 MCG/20ML IV SOLN
INTRAVENOUS | Status: DC | PRN
  Administered 2023-09-07 (×2): 8 ug via INTRAVENOUS

## 2023-09-07 MED ORDER — SODIUM CHLORIDE 0.9 % IV SOLN: Freq: Once | INTRAVENOUS | Status: DC

## 2023-09-07 MED ORDER — LACTATED RINGERS IV SOLN: INTRAVENOUS | Status: DC | PRN

## 2023-09-07 MED ORDER — FENTANYL CITRATE (PF) 250 MCG/5ML IJ SOLN
INTRAMUSCULAR | Status: DC | PRN
  Administered 2023-09-07: 150 ug via INTRAVENOUS
  Administered 2023-09-07: 50 ug via INTRAVENOUS

## 2023-09-07 MED ORDER — GENTAMICIN SULFATE 40 MG/ML IJ SOLN
5.0000 mg/kg | INTRAVENOUS | Status: AC
  Administered 2023-09-07: 450 mg via INTRAVENOUS
  Filled 2023-09-07: qty 11.25

## 2023-09-07 MED ORDER — DEXAMETHASONE SODIUM PHOSPHATE 10 MG/ML IJ SOLN
INTRAMUSCULAR | Status: DC | PRN
  Administered 2023-09-07: 10 mg via INTRAVENOUS

## 2023-09-07 MED ORDER — PHENYLEPHRINE 80 MCG/ML (10ML) SYRINGE FOR IV PUSH (FOR BLOOD PRESSURE SUPPORT)
PREFILLED_SYRINGE | INTRAVENOUS | Status: DC | PRN
  Administered 2023-09-07 (×2): 80 ug via INTRAVENOUS

## 2023-09-07 MED ORDER — GABAPENTIN 300 MG PO CAPS
300.0000 mg | ORAL_CAPSULE | ORAL | Status: AC
  Administered 2023-09-07: 300 mg via ORAL

## 2023-09-07 MED ORDER — FENTANYL CITRATE (PF) 250 MCG/5ML IJ SOLN
INTRAMUSCULAR | Status: AC
  Filled 2023-09-07: qty 5

## 2023-09-07 MED ORDER — STERILE WATER FOR IRRIGATION IR SOLN
Status: DC | PRN
  Administered 2023-09-07: 500 mL

## 2023-09-07 MED ORDER — ACETAMINOPHEN 500 MG PO TABS
1000.0000 mg | ORAL_TABLET | ORAL | Status: AC
  Administered 2023-09-07: 1000 mg via ORAL

## 2023-09-07 MED ORDER — KETOROLAC TROMETHAMINE 30 MG/ML IJ SOLN
INTRAMUSCULAR | Status: DC | PRN
Start: 2023-09-07 — End: 2023-09-07
  Administered 2023-09-07: 30 mg via INTRAVENOUS

## 2023-09-07 MED ORDER — CLINDAMYCIN PHOSPHATE 900 MG/50ML IV SOLN
INTRAVENOUS | Status: AC
  Filled 2023-09-07: qty 50

## 2023-09-07 MED ORDER — SUGAMMADEX SODIUM 200 MG/2ML IV SOLN
INTRAVENOUS | Status: DC | PRN
  Administered 2023-09-07: 300 mg via INTRAVENOUS

## 2023-09-07 MED ORDER — DIPHENHYDRAMINE HCL 50 MG/ML IJ SOLN
INTRAMUSCULAR | Status: DC | PRN
  Administered 2023-09-07: 12.5 mg via INTRAVENOUS

## 2023-09-07 SURGICAL SUPPLY — 59 items
ADH SKN CLS APL DERMABOND .7 (GAUZE/BANDAGES/DRESSINGS) ×2
APL SRG 38 LTWT LNG FL B (MISCELLANEOUS)
APPLICATOR ARISTA FLEXITIP XL (MISCELLANEOUS) IMPLANT
BARRIER ADHS 3X4 INTERCEED (GAUZE/BANDAGES/DRESSINGS) IMPLANT
BRR ADH 4X3 ABS CNTRL BYND (GAUZE/BANDAGES/DRESSINGS)
CATH FOLEY 3WAY 5CC 16FR (CATHETERS) ×2 IMPLANT
COVER BACK TABLE 60X90IN (DRAPES) ×2 IMPLANT
COVER TIP SHEARS 8 DVNC (MISCELLANEOUS) ×2 IMPLANT
DEFOGGER SCOPE WARMER CLEARIFY (MISCELLANEOUS) ×2 IMPLANT
DERMABOND ADVANCED .7 DNX12 (GAUZE/BANDAGES/DRESSINGS) ×2 IMPLANT
DRAPE ARM DVNC X/XI (DISPOSABLE) ×8 IMPLANT
DRAPE COLUMN DVNC XI (DISPOSABLE) ×2 IMPLANT
DRAPE UTILITY XL STRL (DRAPES) ×2 IMPLANT
DRIVER NDL MEGA SUTCUT DVNCXI (INSTRUMENTS) IMPLANT
DRIVER NDLE MEGA SUTCUT DVNCXI (INSTRUMENTS) ×2
DURAPREP 26ML APPLICATOR (WOUND CARE) ×2 IMPLANT
ELECT REM PT RETURN 9FT ADLT (ELECTROSURGICAL) ×2
ELECTRODE REM PT RTRN 9FT ADLT (ELECTROSURGICAL) ×2 IMPLANT
FORCEPS PROGRASP DVNC XI (FORCEP) IMPLANT
GAUZE 4X4 16PLY ~~LOC~~+RFID DBL (SPONGE) IMPLANT
GLOVE NEODERM STER SZ 7 (GLOVE) ×6 IMPLANT
GYRUS RUMI II 2.5CM BLUE (DISPOSABLE)
GYRUS RUMI II 3.5CM BLUE (DISPOSABLE)
GYRUS RUMI II 4.0CM BLUE (DISPOSABLE)
HEMOSTAT ARISTA ABSORB 3G PWDR (HEMOSTASIS) IMPLANT
HOLDER FOLEY CATH W/STRAP (MISCELLANEOUS) IMPLANT
IRRIG SUCT STRYKERFLOW 2 WTIP (MISCELLANEOUS) ×2
IRRIGATION SUCT STRKRFLW 2 WTP (MISCELLANEOUS) ×2 IMPLANT
KIT PINK PAD W/HEAD ARE REST (MISCELLANEOUS) ×2
KIT PINK PAD W/HEAD ARM REST (MISCELLANEOUS) ×2 IMPLANT
KIT TURNOVER CYSTO (KITS) ×2 IMPLANT
LEGGING LITHOTOMY PAIR STRL (DRAPES) ×2 IMPLANT
MANIFOLD NEPTUNE II (INSTRUMENTS) ×2 IMPLANT
OBTURATOR OPTICAL STND 8 DVNC (TROCAR) ×2
OBTURATOR OPTICALSTD 8 DVNC (TROCAR) ×2 IMPLANT
PACK ROBOT WH (CUSTOM PROCEDURE TRAY) ×2 IMPLANT
PACK ROBOTIC GOWN (GOWN DISPOSABLE) ×2 IMPLANT
PAD OB MATERNITY 4.3X12.25 (PERSONAL CARE ITEMS) ×2 IMPLANT
RUMI II 3.0CM BLUE KOH-EFFICIE (DISPOSABLE) IMPLANT
RUMI II GYRUS 2.5CM BLUE (DISPOSABLE) IMPLANT
RUMI II GYRUS 3.5CM BLUE (DISPOSABLE) IMPLANT
RUMI II GYRUS 4.0CM BLUE (DISPOSABLE) IMPLANT
SCISSORS MNPLR CVD DVNC XI (INSTRUMENTS) IMPLANT
SCOPETTES 8 STERILE (MISCELLANEOUS) IMPLANT
SEAL UNIV 5-12 XI (MISCELLANEOUS) ×6 IMPLANT
SEALER VESSEL EXT DVNC XI (MISCELLANEOUS) IMPLANT
SET IRRIG Y TYPE TUR BLADDER L (SET/KITS/TRAYS/PACK) ×2 IMPLANT
SET TUBE SMOKE EVAC HIGH FLOW (TUBING) ×2 IMPLANT
SPIKE FLUID TRANSFER (MISCELLANEOUS) ×2 IMPLANT
SUT MNCRL AB 4-0 PS2 18 (SUTURE) ×2 IMPLANT
SUT VLOC 180 0 9IN GS21 (SUTURE) ×2 IMPLANT
TIP RUMI ORANGE 6.7MMX12CM (TIP) IMPLANT
TIP UTERINE 5.1X6CM LAV DISP (MISCELLANEOUS) IMPLANT
TIP UTERINE 6.7X10CM GRN DISP (MISCELLANEOUS) IMPLANT
TIP UTERINE 6.7X6CM WHT DISP (MISCELLANEOUS) IMPLANT
TIP UTERINE 6.7X8CM BLUE DISP (MISCELLANEOUS) IMPLANT
TOWEL OR 17X24 6PK STRL BLUE (TOWEL DISPOSABLE) ×2 IMPLANT
UNDERPAD 30X36 HEAVY ABSORB (UNDERPADS AND DIAPERS) ×2 IMPLANT
WATER STERILE IRR 500ML POUR (IV SOLUTION) ×2 IMPLANT

## 2023-09-07 NOTE — Anesthesia Preprocedure Evaluation (Signed)
Anesthesia Evaluation  Patient identified by MRN, date of birth, ID band Patient awake    Reviewed: Allergy & Precautions, NPO status , Patient's Chart, lab work & pertinent test results  Airway Mallampati: I  TM Distance: >3 FB Neck ROM: Full    Dental  (+) Teeth Intact, Dental Advisory Given   Pulmonary sleep apnea and Continuous Positive Airway Pressure Ventilation    Pulmonary exam normal breath sounds clear to auscultation       Cardiovascular hypertension, Pt. on medications and Pt. on home beta blockers Normal cardiovascular exam Rhythm:Regular Rate:Normal     Neuro/Psych  PSYCHIATRIC DISORDERS Anxiety     negative neurological ROS     GI/Hepatic Neg liver ROS,GERD  ,,  Endo/Other    Morbid obesity  Renal/GU negative Renal ROS  negative genitourinary   Musculoskeletal  (+) Arthritis ,    Abdominal  (+) + obese  Peds  Hematology  (+) Blood dyscrasia, anemia   Anesthesia Other Findings   Reproductive/Obstetrics                             Anesthesia Physical Anesthesia Plan  ASA: 3  Anesthesia Plan: General   Post-op Pain Management:    Induction: Intravenous  PONV Risk Score and Plan: 4 or greater and Ondansetron, Dexamethasone, Midazolam and Scopolamine patch - Pre-op  Airway Management Planned: Oral ETT  Additional Equipment: None  Intra-op Plan:   Post-operative Plan: Extubation in OR  Informed Consent: I have reviewed the patients History and Physical, chart, labs and discussed the procedure including the risks, benefits and alternatives for the proposed anesthesia with the patient or authorized representative who has indicated his/her understanding and acceptance.     Dental advisory given  Plan Discussed with: CRNA  Anesthesia Plan Comments:         Anesthesia Quick Evaluation

## 2023-09-07 NOTE — Plan of Care (Signed)
[facility] Tonsillectomy/Adenoidectomy, Postoperative PEDS care plan entered in error.

## 2023-09-07 NOTE — Interval H&P Note (Signed)
History and Physical Interval Note:  09/07/2023 10:14 AM  Renee Thomas  has presented today for surgery, with the diagnosis of postmenopausal bleeding.  The various methods of treatment have been discussed with the patient and family. After consideration of risks, benefits and other options for treatment, the patient has consented to  Procedure(s): XI ROBOTIC ASSISTED TOTAL HYSTERECTOMY WITH BILATERAL SALPINGO OOPHORECTOMY (Bilateral) CYSTOSCOPY (N/A) as a surgical intervention.  The patient's history has been reviewed, patient examined, no change in status, stable for surgery.  I have reviewed the patient's chart and labs.  Questions were answered to the patient's satisfaction.     Earley Favor

## 2023-09-07 NOTE — Discharge Instructions (Signed)
  Post Anesthesia Home Care Instructions  Activity: Get plenty of rest for the remainder of the day. A responsible individual must stay with you for 24 hours following the procedure.  For the next 24 hours, DO NOT: -Drive a car -Advertising copywriter -Drink alcoholic beverages -Take any medication unless instructed by your physician -Make any legal decisions or sign important papers.  Meals: Start with liquid foods such as gelatin or soup. Progress to regular foods as tolerated. Avoid greasy, spicy, heavy foods. If nausea and/or vomiting occur, drink only clear liquids until the nausea and/or vomiting subsides. Call your physician if vomiting continues.  Special Instructions/Symptoms: Your throat may feel dry or sore from the anesthesia or the breathing tube placed in your throat during surgery. If this causes discomfort, gargle with warm salt water. The discomfort should disappear within 24 hours.    You were given Tylenol prior to your surgical procedure today, you may take more Tylenol after 1pm today If needed you may take Ibuprofen after 6 pm today as well

## 2023-09-07 NOTE — Anesthesia Postprocedure Evaluation (Signed)
Anesthesia Post Note  Patient: Renee Thomas  Procedure(s) Performed: XI ROBOTIC ASSISTED TOTAL HYSTERECTOMY WITH BILATERAL SALPINGO OOPHORECTOMY (Bilateral: Pelvis) CYSTOSCOPY (Bladder)     Patient location during evaluation: PACU Anesthesia Type: General Level of consciousness: awake Pain management: pain level controlled Vital Signs Assessment: post-procedure vital signs reviewed and stable Respiratory status: spontaneous breathing Cardiovascular status: stable Postop Assessment: no apparent nausea or vomiting Anesthetic complications: no  No notable events documented.  Last Vitals:  Vitals:   09/07/23 0659 09/07/23 1209  BP: (!) 145/79 (!) 148/85  Pulse: 74 72  Resp: 18 (!) 23  Temp: 36.7 C 36.6 C  SpO2: 99% 96%    Last Pain:  Vitals:   09/07/23 1209  TempSrc:   PainSc: 0-No pain                 Caren Macadam

## 2023-09-07 NOTE — Anesthesia Procedure Notes (Signed)
Procedure Name: Intubation Date/Time: 09/07/2023 10:46 AM  Performed by: Dairl Ponder, CRNAPre-anesthesia Checklist: Patient identified, Emergency Drugs available, Suction available and Patient being monitored Patient Re-evaluated:Patient Re-evaluated prior to induction Oxygen Delivery Method: Circle System Utilized Preoxygenation: Pre-oxygenation with 100% oxygen Induction Type: IV induction Ventilation: Mask ventilation without difficulty Laryngoscope Size: Mac and 3 Grade View: Grade I Tube type: Oral Tube size: 7.0 mm Number of attempts: 1 Airway Equipment and Method: Stylet and Oral airway Placement Confirmation: ETT inserted through vocal cords under direct vision, positive ETCO2 and breath sounds checked- equal and bilateral Secured at: 22 cm Tube secured with: Tape Dental Injury: Teeth and Oropharynx as per pre-operative assessment

## 2023-09-07 NOTE — Transfer of Care (Signed)
Immediate Anesthesia Transfer of Care Note  Patient: Renee Thomas  Procedure(s) Performed: XI ROBOTIC ASSISTED TOTAL HYSTERECTOMY WITH BILATERAL SALPINGO OOPHORECTOMY (Bilateral: Pelvis) CYSTOSCOPY (Bladder)  Patient Location: PACU  Anesthesia Type:General  Level of Consciousness: drowsy and patient cooperative  Airway & Oxygen Therapy: Patient Spontanous Breathing and Patient connected to face mask oxygen  Post-op Assessment: Report given to RN and Post -op Vital signs reviewed and stable  Post vital signs: Reviewed and stable  Last Vitals:  Vitals Value Taken Time  BP 148/85 09/07/23 1208  Temp    Pulse 70 09/07/23 1210  Resp 18 09/07/23 1210  SpO2 96 % 09/07/23 1210  Vitals shown include unfiled device data.  Last Pain:  Vitals:   09/07/23 0659  TempSrc: Oral  PainSc: 0-No pain      Patients Stated Pain Goal: 6 (09/07/23 0659)  Complications: No notable events documented.

## 2023-09-07 NOTE — Op Note (Signed)
09/07/2023   [id number]  Renee Thomas         OPERATIVE REPORT   Preop Diagnosis: PM bleeding, adenomyosis, fibroids, anemia Procedure: robotic hysterectomy, BSO, cystoscopy   Surgeon: Dr. Allayne Butcher Assistant: Donne Hazel, RN   Fluids: please see anesthesia report   Complications: None Anesthesia: General     Findings:  boggy 10cm uterus, normal ovaries and tubes Cystoscopy at the end of the case with normal bladder and patent ureters bilaterally.   Estimated blood loss: Minimal <25cc   Specimens: Uterus, cervix and bilateral tubes and ovaries 189 grams   Disposition of specimen: Pathology           Patient is taken to the operating room. She is placed in the supine position. She is a running IV in place. Informed consent was present on the chart. SCDs on her lower extremities and functioning properly. Patient was positioned while she was awake.  Her legs were placed in the low lithotomy position in [place] stirrups. Her arms were tucked by the side.  General endotracheal anesthesia was administered by the anesthesia staff without difficulty.       Chlora prep was then used to prep the abdomen and Hibiclens was used to prep the inner thighs, perineum and vagina. Once 3 minutes had past the patient was draped in a normal standard fashion. A proper time out was performed and everyone agreed.  The legs were lifted to the high lithotomy position. A bivalve speculum was inserted into the vagina and the anterior lip of the cervix was grasped with single-tooth tenaculum.  The uterus sounded to  10 cm. Pratt dilators were used to dilate the cervix.  The RUMI uterine manipulator was obtained inserted into the endometrial cavity and the bulb of the disposable tip was inflated with 8 cc of normal saline. There was a good fit of the KOH ring around the cervix. The tenaculum and bivavle speculum was removed. There is also good manipulation of the uterus.  A Foley  catheter was placed to straight drain.  Clear urine was noted. Legs were lowered to the low lithotomy position and attention was turned the abdomen.   Superior to the umbilicus, marcaine 0.25% used to anesthetize the skin.  Using #11 blade, 8mm skin incision was made.  The 8mm robotic trocar and sleeve was inserted under direct visualization.  CO2 gas was  started and patient was placed in trendelenburg position.  Two additional 8mm ports were placed under direct visualization in the left and right lower quadrant.     Ureters were identifies.  Attention was turned to the left side.  The left IP ligament was noted away from the ureters and this was desiccated and transected with the vessel sealer.  The uterine ovarian pedicle was serially clamped cauterized and incised. Left round ligament was serially clamped cauterized and incised. The anterior and posterior peritoneum of the inferior leaf of the broad ligament were opened. The beginning of the bladder flap was created.  The bladder was taken down below the level of the KOH ring. The left uterine artery skeletonized and then just superior to the KOH ring this vessel was serially clamped, cauterized, and incised.   Attention was turned the right side.  The uterus was placed on stretch to the opposite side.    The right IP ligament was away from the ureter and this was desiccated and transected with the vessel sealer.  Then the right uterine ovarian pedicle was serially clamped  cauterized and incised. Next the right round ligament was serially clamped cauterized and incised. The anterior posterior peritoneum of the inferiorly for the broad ligament were opened. The anterior peritoneum was carried across to the dissection on the left side. The remainder of the bladder flap was created using sharp dissection. The bladder was well below the level of the KOH ring. The right uterine artery skeletonized. Then the right uterine artery, above the level of the KOH ring,  was serially clamped cauterized and incised. The uterus was devascularized at this point.   The colpotomy was performed.  This was carried around a circumferential fashion until the vaginal mucosa was completely incised in the specimen was freed.  The specimen was then delivered to the vagina.  A vaginal occlusive device was used to maintain the pneumoperitoneum   Instruments were changed with a needle driver and prograsp.  Using a 9 inch  zero V-lock suture, the cuff was closed by incorporating the anterior and posterior vaginal mucosa in each stitch. This was carried across all the way to the left corner and a running fashion. Two stitches were brought back towards the midline and the suture was cut flush with the vagina. The needle was brought out the pelvis. The pelvis was irrigated. All pedicles were inspected. No bleeding was noted.   CO2 pressures were lowered to 8mm Hg.  Again, no bleeding was noted.  Ureters were noted deep in the pelvis to be peristalsing.  At this point the procedure was completed.  The remaining instruments were removed.  The ports were removed under direct visualization of the laparoscope and the pneumoperitoneum was relieved.   The skin was then closed with subcuticular stitches of 3-0 Vicryl. The skin was cleansed Dermabond was applied. Attention was then turned the vagina and the cuff was inspected. No bleeding was noted.  The Foley catheter was removed.  Cystoscopy was performed.  No sutures or bladder injuries were noted.  Ureters were noted with normal urine jets from each one was seen.  Foley was left out after the cystoscopic fluid was drained and cystoscope removed.  Sponge, lap, needle, instrument counts were correct x2. Patient tolerated the procedure very well. She was awakened from anesthesia, extubated and taken to recovery in stable condition.      Dr. Karma Greaser

## 2023-09-08 ENCOUNTER — Other Ambulatory Visit: Payer: Self-pay | Admitting: Nurse Practitioner

## 2023-09-08 DIAGNOSIS — F411 Generalized anxiety disorder: Secondary | ICD-10-CM

## 2023-09-08 DIAGNOSIS — K219 Gastro-esophageal reflux disease without esophagitis: Secondary | ICD-10-CM

## 2023-09-09 ENCOUNTER — Other Ambulatory Visit: Payer: Self-pay | Admitting: Nurse Practitioner

## 2023-09-09 DIAGNOSIS — I10 Essential (primary) hypertension: Secondary | ICD-10-CM

## 2023-09-10 LAB — SURGICAL PATHOLOGY

## 2023-09-11 ENCOUNTER — Encounter (HOSPITAL_BASED_OUTPATIENT_CLINIC_OR_DEPARTMENT_OTHER): Payer: Self-pay | Admitting: Obstetrics and Gynecology

## 2023-09-11 ENCOUNTER — Ambulatory Visit: Payer: Medicare HMO | Admitting: Nurse Practitioner

## 2023-09-12 ENCOUNTER — Other Ambulatory Visit (HOSPITAL_BASED_OUTPATIENT_CLINIC_OR_DEPARTMENT_OTHER): Payer: Self-pay

## 2023-09-26 ENCOUNTER — Ambulatory Visit (INDEPENDENT_AMBULATORY_CARE_PROVIDER_SITE_OTHER): Payer: Medicare HMO | Admitting: Obstetrics and Gynecology

## 2023-09-26 ENCOUNTER — Encounter: Payer: Self-pay | Admitting: Obstetrics and Gynecology

## 2023-09-26 VITALS — BP 114/72 | HR 67

## 2023-09-26 DIAGNOSIS — Z09 Encounter for follow-up examination after completed treatment for conditions other than malignant neoplasm: Secondary | ICD-10-CM

## 2023-09-26 MED ORDER — ESTRADIOL 0.1 MG/GM VA CREA: 1.0000 | TOPICAL_CREAM | Freq: Every day | VAGINAL | 12 refills | Status: DC

## 2023-09-26 NOTE — Progress Notes (Signed)
Patient presents for 2 week postop from [facility], bilateral salpingectomy, cystoscopy. She is doing well. No fevers, VB, dysuria or severe abdominal pain. Scant pink spotting  Blood pressure 104/70, pulse 72, weight 184 lb (83.5 kg), last menstrual period 08/08/2023, SpO2 100%.  Abdomen: incisions I/c/d, NT, ND  A/p PO from [facility] 2 weeks doing well Encouraged no heavy lifting, pushing, pulling greater than 10 lbs for full 8 weeks 2. Pelvic rest for the entire 10 wks 3. RTC with any concerns or with heavy bleeding, fevers or severe abdominal pain. 4.  Encouraged PV estrace for cuff healing  Dr. Karma Greaser

## 2023-09-27 ENCOUNTER — Ambulatory Visit: Payer: Medicare HMO | Admitting: Nurse Practitioner

## 2023-09-27 ENCOUNTER — Encounter: Payer: Self-pay | Admitting: Nurse Practitioner

## 2023-09-27 VITALS — BP 112/64 | HR 56 | Temp 98.0°F | Resp 20 | Ht 65.0 in | Wt 302.0 lb

## 2023-09-27 DIAGNOSIS — Z23 Encounter for immunization: Secondary | ICD-10-CM

## 2023-09-27 DIAGNOSIS — Z6841 Body Mass Index (BMI) 40.0 and over, adult: Secondary | ICD-10-CM

## 2023-09-27 DIAGNOSIS — T7840XA Allergy, unspecified, initial encounter: Secondary | ICD-10-CM | POA: Diagnosis not present

## 2023-09-27 DIAGNOSIS — K219 Gastro-esophageal reflux disease without esophagitis: Secondary | ICD-10-CM | POA: Diagnosis not present

## 2023-09-27 DIAGNOSIS — F411 Generalized anxiety disorder: Secondary | ICD-10-CM

## 2023-09-27 DIAGNOSIS — I10 Essential (primary) hypertension: Secondary | ICD-10-CM | POA: Diagnosis not present

## 2023-09-27 DIAGNOSIS — E7800 Pure hypercholesterolemia, unspecified: Secondary | ICD-10-CM

## 2023-09-27 DIAGNOSIS — E538 Deficiency of other specified B group vitamins: Secondary | ICD-10-CM

## 2023-09-27 LAB — BAYER DCA HB A1C WAIVED: HB A1C (BAYER DCA - WAIVED): 5.8 % — ABNORMAL HIGH (ref 4.8–5.6)

## 2023-09-27 MED ORDER — OMEPRAZOLE 20 MG PO CPDR: 20.0000 mg | DELAYED_RELEASE_CAPSULE | Freq: Every day | ORAL | 1 refills | Status: DC

## 2023-09-27 MED ORDER — ROSUVASTATIN CALCIUM 10 MG PO TABS: 10.0000 mg | ORAL_TABLET | Freq: Every day | ORAL | 1 refills | Status: DC

## 2023-09-27 MED ORDER — ESCITALOPRAM OXALATE 10 MG PO TABS: 10.0000 mg | ORAL_TABLET | Freq: Every day | ORAL | 1 refills | Status: DC

## 2023-09-27 MED ORDER — METOPROLOL TARTRATE 25 MG PO TABS: ORAL_TABLET | ORAL | 1 refills | Status: DC

## 2023-09-27 MED ORDER — AMLODIPINE BESYLATE 2.5 MG PO TABS: 2.5000 mg | ORAL_TABLET | Freq: Every day | ORAL | 1 refills | Status: DC

## 2023-09-27 MED ORDER — HYDROCHLOROTHIAZIDE 25 MG PO TABS: 25.0000 mg | ORAL_TABLET | Freq: Every day | ORAL | 1 refills | Status: DC

## 2023-09-27 NOTE — Patient Instructions (Signed)
Extensor Pollicis Longus Tendinitis  Tendinitis is inflammation of a tendon. The tendons are the tissues that connect muscles to bone. The extensor pollicis longus (EPL) tendon is a tendon that runs from the mid-forearm to the mid-thumb area. This tendon helps to pull the thumb up and back. EPL tendinitis occurs when the lining (sheath) of the EPL tendon becomes irritated and swollen at the back of the wrist. This causes pain on the thumb side of the back of the wrist. What are the causes? This condition may be caused by: Overusing the muscle or tendon by doing activities that repeatedly cause your thumb and wrist to extend. A sudden increase in activity or a change in activity. A previous wrist injury. What increases the risk? You are more likely to develop this condition if: You have had a broken wrist or other wrist injury. You play sports or activities that involve repeated hand and wrist motions, such as tennis, golf, bowling, and gardening. You do heavy labor, such as Holiday representative work. You have poor wrist strength and flexibility. You have rheumatoid arthritis. What are the signs or symptoms? Symptoms of this condition include: Pain or tenderness over the thumb side of the back of the wrist when your thumb and wrist are not moving. Swelling over the affected area. Pain that gets worse when you straighten or bend your thumb or wrist. Pain when you touch the affected area. Decreased thumb and wrist range of motion due to pain. How is this diagnosed? This condition may be diagnosed with: Your symptoms and medical history, including the details of any injury. A physical exam. An MRI. This imaging test will confirm the diagnosis. How is this treated? Treatment for this condition may include: Applying ice to the affected area. Taking medicines to lessen pain and swelling. Wearing a splint or brace to limit the movement of your thumb and wrist until your symptoms improve. Doing  physical therapy exercises to improve movement and strength in your thumb and wrist. Therapy to help with everyday activities (occupational therapy). Injection of an anti-inflammatory medicine (steroid) if the other treatments are not helping. Surgery. This is only needed in severe cases. Follow these instructions at home: If you have a removable splint or brace: Wear the splint or brace as told by your health care provider. Remove it only as told by your provider. Check the skin around the splint or brace every day. Tell your provider about any concerns. Loosen the splint or brace if your fingers tingle, become numb, or turn cold and blue. Keep the splint or brace clean and dry. If the splint or brace is not waterproof: Do not let it get wet. Cover it with a watertight covering when you take a bath or shower. Managing pain, stiffness, and swelling  If told, put ice on the painful area. If you have a removable splint or brace, remove it as told by your provider. Put ice in a plastic bag. Place a towel between your skin and the bag. Leave the ice on for 20 minutes, 2-3 times a day. If your skin turns bright red, remove the ice right away to prevent skin damage. The risk of damage is higher if you cannot feel pain, heat, or cold. Move your fingers often through a full range of motion to reduce stiffness and swelling. Raise (elevate) the injured area above the level of your heart while you are sitting or lying down. Activity Rest the affected area as told by your provider. Return to your  normal activities as told by your provider. Ask your provider what activities are safe for you. Do exercises as told by your provider. General instructions Take over-the-counter and prescription medicines only as told by your provider. Keep all follow-up visits. Your provider will check how you are progressing. Contact a health care provider if: Your pain, tenderness, or swelling gets worse, even if you  have had treatment. You have numbness or tingling in your wrist, hand, or fingers on the injured side. Get help right away if: Your pain is severe. You are unable to straighten the tip of your thumb using just your muscle. This information is not intended to replace advice given to you by your health care provider. Make sure you discuss any questions you have with your health care provider. Document Revised: 06/09/2022 Document Reviewed: 06/09/2022 Elsevier Patient Education  2024 [employer].

## 2023-09-27 NOTE — Progress Notes (Signed)
Subjective:    Patient ID: Renee Thomas, female    DOB: 1970-12-30, 52 y.o.   MRN: [id number]   Chief Complaint: Medical Management of Chronic Issues    HPI:  Renee Thomas is a 52 y.o. who identifies as a female who was assigned female at birth.   Social history: Lives with: sister Work history: not working   Comes in today for follow up of the following chronic medical issues:  1. Primary hypertension No c/o chest pain, sob or headache. Does  not check blood pressure at home. BP Readings from Last 3 Encounters:  09/27/23 112/64  09/26/23 114/72  09/07/23 131/70     2. GERD without esophagitis Is on omeprazole and is doing well  3. GAD (generalized anxiety disorder) Is on lexapro and is doing well    09/27/2023   10:57 AM 03/12/2023    8:25 AM 09/11/2022    8:34 AM 03/15/2022    9:18 AM  GAD 7 : Generalized Anxiety Score  Nervous, Anxious, on Edge 1 1 1 1   Control/stop worrying 1 1 1 1   Worry too much - different things 1 0 0 1  Trouble relaxing 1 0 1 0  Restless 0 0 0 1  Easily annoyed or irritable 0 0 0 0  Afraid - awful might happen 0 0 0 0  Total GAD 7 Score 4 2 3 4   Anxiety Difficulty Not difficult at all Not difficult at all Not difficult at all Somewhat difficult      4. Low serum vitamin B12 Lab Results  Component Value Date   VITAMINB12 1,087 03/12/2023     5. BMI 45.0-49.9, adult (HCC) Weight is down 4 lbs Wt Readings from Last 3 Encounters:  09/27/23 (!) 302 lb (137 kg)  09/07/23 (!) 306 lb 1.6 oz (138.8 kg)  09/04/23 (!) 303 lb (137.4 kg)   BMI Readings from Last 3 Encounters:  09/27/23 50.26 kg/m  09/07/23 50.94 kg/m  09/04/23 50.42 kg/m      New complaints: Right wrist pain- started several months ago. Typing increases pain.  Allergies  Allergen Reactions   Egg-Derived Products Swelling    Swells mouth with itching   Fluarix [Influenza Virus Vaccine] Swelling    Contains egg protein.   Other Anaphylaxis    All  Tree-Nuts   Peanut-Containing Drug Products Anaphylaxis   Penicillins Rash and Anaphylaxis    .Has patient had a PCN reaction causing immediate rash, facial/tongue/throat swelling, SOB or lightheadedness with hypotension: Yes Has patient had a PCN reaction causing severe rash involving mucus membranes or skin necrosis: No Has patient had a PCN reaction that required hospitalization: no Has patient had a PCN reaction occurring within the last 10 years: no If all of the above answers are "NO", then may proceed with Cephalosporin use.  Other reaction(s): Other (See Comments) .Has patient had a PCN reaction causing immediate rash, facial/tongue/throat swelling, SOB or lightheadedness with hypotension: Yes Has patient had a PCN reaction causing severe rash involving mucus membranes or skin necrosis: No Has patient had a PCN reaction that required hospitalization: no Has patient had a PCN reaction occurring within the last 10 years: no If all of the above answers are "NO", then may proceed with Cephalosporin use.   Apple Itching    Mouth itching   Blueberry Flavor Itching   Kiwi Extract Itching    Per pt tongue and throat itching/ burning   Watermelon Flavor Itching    And Canteloupe  Amoxicillin Rash   Sulfa Antibiotics Rash    numbness   Outpatient Encounter Medications as of 09/27/2023  Medication Sig   acetaminophen (TYLENOL) 650 MG CR tablet Take 650 mg by mouth 2 (two) times daily.   amLODipine (NORVASC) 2.5 MG tablet TAKE 1 TABLET BY MOUTH EVERY DAY   Carboxymethylcellul-Glycerin (CLEAR EYES FOR DRY EYES OP) Place 1-2 drops into both eyes daily as needed (dry eyes).   Cholecalciferol (VITAMIN D3) 50 MCG (2000 UT) TABS Take 1 tablet by mouth daily after lunch.   Cyanocobalamin (B-12 COMPLIANCE INJECTION IJ) Inject as directed every 30 (thirty) days.   escitalopram (LEXAPRO) 10 MG tablet TAKE 1 TABLET BY MOUTH EVERY DAY   estradiol (DOTTI) 0.0375 MG/24HR Place 1 patch onto the skin  2 (two) times a week.   fexofenadine (ALLEGRA) 180 MG tablet TAKE 1 TABLET BY MOUTH EVERY DAY (Patient taking differently: Take 180 mg by mouth daily.)   fluticasone (FLONASE) 50 MCG/ACT nasal spray SPRAY 2 SPRAYS INTO EACH NOSTRIL EVERY DAY (Patient taking differently: Place 2 sprays into both nostrils daily as needed for allergies.)   hydrochlorothiazide (HYDRODIURIL) 25 MG tablet TAKE 1 TABLET (25 MG TOTAL) BY MOUTH DAILY.   ibuprofen (ADVIL) 800 MG tablet Take 1 tablet (800 mg total) by mouth every 8 (eight) hours as needed.   Krill Oil 350 MG CAPS Take 1 capsule by mouth daily.   metoprolol tartrate (LOPRESSOR) 25 MG tablet TAKE 1/2 TABLET (12.5 MG TOTAL) BY MOUTH 2 (TWO) TIMES DAILY AS NEEDED (PALPITATIONS).   nystatin (MYCOSTATIN/NYSTOP) powder Apply 1 Application topically 3 (three) times daily. Apply to affected area for up to 7 days (Patient taking differently: Apply 1 Application topically 3 (three) times daily as needed. Apply to affected area for up to 7 days)   omeprazole (PRILOSEC) 20 MG capsule TAKE 1 CAPSULE BY MOUTH EVERY DAY   Probiotic Product (PROBIOTIC PO) Take 1 capsule by mouth daily.   rosuvastatin (CRESTOR) 10 MG tablet TAKE 1 TABLET BY MOUTH EVERY DAY (Patient taking differently: Take 10 mg by mouth daily.)   estradiol (ESTRACE VAGINAL) 0.1 MG/GM vaginal cream Place 1 Applicatorful vaginally at bedtime. Rub a pea size amount in the vagina nightly for 3 weeks then 3 times a week thereafter (Patient not taking: Reported on 09/27/2023)   No facility-administered encounter medications on file as of 09/27/2023.    Past Surgical History:  Procedure Laterality Date   COLONOSCOPY WITH PROPOFOL N/A 08/24/2023   Procedure: COLONOSCOPY WITH PROPOFOL;  Surgeon: Jeani Hawking, MD;  Location: [facility];  Service: Gastroenterology;  Laterality: N/A;   CYSTOSCOPY N/A 09/07/2023   Procedure: CYSTOSCOPY;  Surgeon: Earley Favor, MD;  Location: [facility];   Service: Gynecology;  Laterality: N/A;   DILATATION & CURETTAGE/HYSTEROSCOPY WITH MYOSURE N/A 07/11/2022   Procedure: DILATATION & CURETTAGE/HYSTEROSCOPY WITH MYOSURE RESECTION OF ENDOMETRIAL POLYPS;  Surgeon: Patton Salles, MD;  Location: [facility] [place];  Service: Gynecology;  Laterality: N/A;   POLYPECTOMY  08/24/2023   Procedure: POLYPECTOMY;  Surgeon: Jeani Hawking, MD;  Location: [facility];  Service: Gastroenterology;;   ROBOTIC ASSISTED TOTAL HYSTERECTOMY WITH BILATERAL SALPINGO OOPHERECTOMY Bilateral 09/07/2023   Procedure: XI ROBOTIC ASSISTED TOTAL HYSTERECTOMY WITH BILATERAL SALPINGO OOPHORECTOMY;  Surgeon: Earley Favor, MD;  Location: [facility];  Service: Gynecology;  Laterality: Bilateral;    Family History  Problem Relation Age of Onset   Hypertension Mother    Diabetes Mother    Other  Mother        renal disease   Hypertension Father    Heart attack Father    Hypertension Sister    Diabetes Brother    Diabetes Brother    Sleep apnea Neg Hx       Controlled substance contract: n/a     Review of Systems  Constitutional:  Negative for diaphoresis.  Eyes:  Negative for pain.  Respiratory:  Negative for shortness of breath.   Cardiovascular:  Negative for chest pain, palpitations and leg swelling.  Gastrointestinal:  Negative for abdominal pain.  Endocrine: Negative for polydipsia.  Skin:  Negative for rash.  Neurological:  Negative for dizziness, weakness and headaches.  Hematological:  Does not bruise/bleed easily.  All other systems reviewed and are negative.      Objective:   Physical Exam Vitals and nursing note reviewed.  Constitutional:      General: She is not in acute distress.    Appearance: Normal appearance. She is well-developed.  HENT:     Head: Normocephalic.     Right Ear: Tympanic membrane normal.     Left Ear: Tympanic membrane normal.     Nose: Nose normal.     Mouth/Throat:      Mouth: Mucous membranes are moist.  Eyes:     Pupils: Pupils are equal, round, and reactive to light.  Neck:     Vascular: No carotid bruit or JVD.  Cardiovascular:     Rate and Rhythm: Normal rate and regular rhythm.     Heart sounds: Normal heart sounds.  Pulmonary:     Effort: Pulmonary effort is normal. No respiratory distress.     Breath sounds: Normal breath sounds. No wheezing or rales.  Chest:     Chest wall: No tenderness.  Abdominal:     General: Bowel sounds are normal. There is no distension or abdominal bruit.     Palpations: Abdomen is soft. There is no hepatomegaly, splenomegaly, mass or pulsatile mass.     Tenderness: There is no abdominal tenderness.  Musculoskeletal:        General: Normal range of motion.     Cervical back: Normal range of motion and neck supple.     Comments: FROM of right wrist without pain Grips equal bil  Lymphadenopathy:     Cervical: No cervical adenopathy.  Skin:    General: Skin is warm and dry.  Neurological:     Mental Status: She is alert and oriented to person, place, and time.     Deep Tendon Reflexes: Reflexes are normal and symmetric.  Psychiatric:        Behavior: Behavior normal.        Thought Content: Thought content normal.        Judgment: Judgment normal.    BP 112/64   Pulse (!) 56   Temp 98 F (36.7 C) (Temporal)   Resp 20   Ht 5\' 5"  (1.651 m)   Wt (!) 302 lb (137 kg)   LMP 12/27/2022 (Approximate)   SpO2 99%   BMI 50.26 kg/m         Assessment & Plan:   Renee Thomas comes in today with chief complaint of Medical Management of Chronic Issues   Diagnosis and orders addressed:  1. Primary hypertension Low sodium diet - CBC with Differential/Platelet - CMP14+EGFR - Lipid panel - Thyroid Panel With TSH - Bayer DCA Hb A1c Waived - amLODipine (NORVASC) 2.5 MG tablet; Take 1 tablet (2.5 mg  total) by mouth daily.  Dispense: 90 tablet; Refill: 1 - hydrochlorothiazide (HYDRODIURIL) 25 MG tablet; Take  1 tablet (25 mg total) by mouth daily.  Dispense: 90 tablet; Refill: 1 - metoprolol tartrate (LOPRESSOR) 25 MG tablet; TAKE 1/2 TABLET (12.5 MG TOTAL) BY MOUTH 2 (TWO) TIMES DAILY AS NEEDED (PALPITATIONS).  Dispense: 90 tablet; Refill: 1  2. GERD without esophagitis Avoid spicy foods Do not eat 2 hours prior to bedtime  - omeprazole (PRILOSEC) 20 MG capsule; Take 1 capsule (20 mg total) by mouth daily.  Dispense: 90 capsule; Refill: 1  3. GAD (generalized anxiety disorder) Stress management - escitalopram (LEXAPRO) 10 MG tablet; Take 1 tablet (10 mg total) by mouth daily.  Dispense: 90 tablet; Refill: 1  4. Low serum vitamin B12   5. BMI 45.0-49.9, adult (HCC) Discussed diet and exercise for person with BMI >25 Will recheck weight in 3-6 months   6. Pure hypercholesterolemia Low fat diet - rosuvastatin (CRESTOR) 10 MG tablet; Take 1 tablet (10 mg total) by mouth daily.  Dispense: 90 tablet; Refill: 1  7. Allergy, initial encounter - Ambulatory referral to Allergy  8. Wrist tendonitis Wear wrist brace Motrin OTC  Labs pending Health Maintenance reviewed Diet and exercise encouraged  Follow up plan: 6 months   [name] Daphine Deutscher, FNP

## 2023-09-28 LAB — CMP14+EGFR
ALT: 13 [IU]/L (ref 0–32)
AST: 14 [IU]/L (ref 0–40)
Albumin: 4.4 g/dL (ref 3.8–4.9)
Alkaline Phosphatase: 128 [IU]/L — ABNORMAL HIGH (ref 44–121)
BUN/Creatinine Ratio: 19 (ref 9–23)
BUN: 17 mg/dL (ref 6–24)
Bilirubin Total: 0.4 mg/dL (ref 0.0–1.2)
CO2: 24 mmol/L (ref 20–29)
Calcium: 10 mg/dL (ref 8.7–10.2)
Chloride: 98 mmol/L (ref 96–106)
Creatinine, Ser: 0.91 mg/dL (ref 0.57–1.00)
Globulin, Total: 3.5 g/dL (ref 1.5–4.5)
Glucose: 82 mg/dL (ref 70–99)
Potassium: 4.1 mmol/L (ref 3.5–5.2)
Sodium: 138 mmol/L (ref 134–144)
Total Protein: 7.9 g/dL (ref 6.0–8.5)
eGFR: 75 mL/min/{1.73_m2} (ref >59)

## 2023-09-28 LAB — THYROID PANEL WITH TSH
Free Thyroxine Index: 2 (ref 1.2–4.9)
T3 Uptake Ratio: 25 % (ref 24–39)
T4, Total: 8 ug/dL (ref 4.5–12.0)
TSH: 1.51 u[IU]/mL (ref 0.450–4.500)

## 2023-09-28 LAB — LIPID PANEL
Chol/HDL Ratio: 2.9 ratio (ref 0.0–4.4)
Cholesterol, Total: 133 mg/dL (ref 100–199)
HDL: 46 mg/dL (ref >39)
LDL Chol Calc (NIH): 73 mg/dL (ref 0–99)
Triglycerides: 67 mg/dL (ref 0–149)
VLDL Cholesterol Cal: 14 mg/dL (ref 5–40)

## 2023-09-28 LAB — CBC WITH DIFFERENTIAL/PLATELET
Basophils Absolute: 0 10*3/uL (ref 0.0–0.2)
Basos: 1 %
EOS (ABSOLUTE): 0.2 10*3/uL (ref 0.0–0.4)
Eos: 3 %
Hematocrit: 40 % (ref 34.0–46.6)
Hemoglobin: 12.3 g/dL (ref 11.1–15.9)
Immature Grans (Abs): 0 10*3/uL (ref 0.0–0.1)
Immature Granulocytes: 0 %
Lymphocytes Absolute: 2.2 10*3/uL (ref 0.7–3.1)
Lymphs: 36 %
MCH: 25.7 pg — ABNORMAL LOW (ref 26.6–33.0)
MCHC: 30.8 g/dL — ABNORMAL LOW (ref 31.5–35.7)
MCV: 84 fL (ref 79–97)
Monocytes Absolute: 0.5 10*3/uL (ref 0.1–0.9)
Monocytes: 9 %
Neutrophils Absolute: 3.1 10*3/uL (ref 1.4–7.0)
Neutrophils: 51 %
Platelets: 446 10*3/uL (ref 150–450)
RBC: 4.79 x10E6/uL (ref 3.77–5.28)
RDW: 15.1 % (ref 11.7–15.4)
WBC: 6.1 10*3/uL (ref 3.4–10.8)

## 2023-09-28 NOTE — Addendum Note (Signed)
Addended by: Cleda Daub on: 09/28/2023 07:26 AM   Modules accepted: Orders

## 2023-10-08 ENCOUNTER — Ambulatory Visit (INDEPENDENT_AMBULATORY_CARE_PROVIDER_SITE_OTHER): Payer: Medicare HMO

## 2023-10-08 DIAGNOSIS — E538 Deficiency of other specified B group vitamins: Secondary | ICD-10-CM | POA: Diagnosis not present

## 2023-10-08 DIAGNOSIS — M67833 Other specified disorders of tendon, right wrist: Secondary | ICD-10-CM | POA: Diagnosis not present

## 2023-10-08 MED ORDER — CYANOCOBALAMIN 1000 MCG/ML IJ SOLN
1000.0000 ug | INTRAMUSCULAR | Status: AC
  Administered 2023-10-08 – 2024-02-28 (×5): 1000 ug via INTRAMUSCULAR

## 2023-10-08 NOTE — Progress Notes (Signed)
Cyanocobalamin injection given to right deltoid.  Patient tolerated well. 

## 2023-10-11 DIAGNOSIS — Z1231 Encounter for screening mammogram for malignant neoplasm of breast: Secondary | ICD-10-CM | POA: Diagnosis not present

## 2023-10-16 ENCOUNTER — Encounter: Payer: Self-pay | Admitting: Obstetrics and Gynecology

## 2023-10-19 DIAGNOSIS — G4733 Obstructive sleep apnea (adult) (pediatric): Secondary | ICD-10-CM | POA: Diagnosis not present

## 2023-10-28 ENCOUNTER — Other Ambulatory Visit: Payer: Self-pay | Admitting: Nurse Practitioner

## 2023-11-02 ENCOUNTER — Telehealth: Payer: Medicare HMO | Admitting: Physician Assistant

## 2023-11-02 DIAGNOSIS — J019 Acute sinusitis, unspecified: Secondary | ICD-10-CM

## 2023-11-02 DIAGNOSIS — B9789 Other viral agents as the cause of diseases classified elsewhere: Secondary | ICD-10-CM | POA: Diagnosis not present

## 2023-11-02 MED ORDER — IPRATROPIUM BROMIDE 0.03 % NA SOLN: 2.0000 | Freq: Two times a day (BID) | NASAL | 0 refills | Status: DC

## 2023-11-02 NOTE — Progress Notes (Signed)
E-Visit for Sinus Problems  We are sorry that you are not feeling well.  Here is how we plan to help!  Based on what you have shared with me it looks like you have sinusitis.  Sinusitis is inflammation and infection in the sinus cavities of the head.  Based on your presentation I believe you most likely have Acute Viral Sinusitis.This is an infection most likely caused by a virus. There is not specific treatment for viral sinusitis other than to help you with the symptoms until the infection runs its course.  You may use an oral decongestant such as Mucinex D or if you have glaucoma or high blood pressure use plain Mucinex. Saline nasal spray help and can safely be used as often as needed for congestion, I have prescribed: Ipratropium Bromide nasal spray 0.03% 2 sprays in eah nostril 2-3 times a day  Some authorities believe that zinc sprays or the use of Echinacea may shorten the course of your symptoms.  Sinus infections are not as easily transmitted as other respiratory infection, however we still recommend that you avoid close contact with loved ones, especially the very young and elderly.  Remember to wash your hands thoroughly throughout the day as this is the number one way to prevent the spread of infection!  Home Care: Only take medications as instructed by your medical team. Do not take these medications with alcohol. A steam or ultrasonic humidifier can help congestion.  You can place a towel over your head and breathe in the steam from hot water coming from a faucet. Avoid close contacts especially the very young and the elderly. Cover your mouth when you cough or sneeze. Always remember to wash your hands.  Get Help Right Away If: You develop worsening fever or sinus pain. You develop a severe head ache or visual changes. Your symptoms persist after you have completed your treatment plan.  Make sure you Understand these instructions. Will watch your condition. Will get help  right away if you are not doing well or get worse.   Thank you for choosing an e-visit.  Your e-visit answers were reviewed by a board certified advanced clinical practitioner to complete your personal care plan. Depending upon the condition, your plan could have included both over the counter or prescription medications.  Please review your pharmacy choice. Make sure the pharmacy is open so you can pick up prescription now. If there is a problem, you may contact your provider through [employer] and have the prescription routed to another pharmacy.  Your safety is important to [place]. If you have drug allergies check your prescription carefully.   For the next 24 hours you can use MyChart to ask questions about today's visit, request a non-urgent call back, or ask for a work or school excuse. You will get an email in the next two days asking about your experience. I hope that your e-visit has been valuable and will speed your recovery.  I have spent 5 minutes in review of e-visit questionnaire, review and updating patient chart, medical decision making and response to patient.   Mar Daring, PA-C

## 2023-11-12 ENCOUNTER — Ambulatory Visit: Payer: Medicare HMO

## 2023-11-14 ENCOUNTER — Encounter: Payer: Self-pay | Admitting: Obstetrics and Gynecology

## 2023-11-14 ENCOUNTER — Ambulatory Visit (INDEPENDENT_AMBULATORY_CARE_PROVIDER_SITE_OTHER): Payer: No Typology Code available for payment source | Admitting: Obstetrics and Gynecology

## 2023-11-14 VITALS — BP 114/64 | HR 61

## 2023-11-14 DIAGNOSIS — Z09 Encounter for follow-up examination after completed treatment for conditions other than malignant neoplasm: Secondary | ICD-10-CM

## 2023-11-14 NOTE — Progress Notes (Signed)
 Patient presents for 10 week postop from [facility], BSO, cystoscopy. She is doing well. No fevers, VB, dysuria or severe abdominal pain.  BP 114/64   Pulse 61   LMP 12/27/2022 (Approximate)   SpO2 98%   SVE: sutures dissolved, normal discharge, no bleeding    PATHOLOGY SURGICAL PATHOLOGY CASE: [id number] PATIENT: Renee Thomas Surgical Pathology Report     Clinical History: Postmenopausal bleeding (crm)     FINAL MICROSCOPIC DIAGNOSIS:  A. 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  Specimen: received fresh and subsequently placed in formalin labeled with the patient's name and Cervix, uterus, bilateral fallopian. Integrity: intact Weight: 163 g Uterus Size and Shape: 7.6 x 6.0 x 5.0 cm Serosa: red-tan, smooth, and glistening. Cervix: 4.6 cm long and 3.4 cm in diameter with 2.0 ectocervical mucosa surrounding a cm patent, slit-like external os. Endometrium: red-tan, trabeculated endometrium lines the 3.5 cm long, 3.7 cm wide endometrial cavity. Myometrium: 2.0-2.8 cm thick and grossly unremarkable. Right adnexa: fimbriated fallopian tube - 10.9 x 0.3 cm, red-tan, and with unremarkable cut and serosa surfaces. Ovary - 2.3 x 1.7 x 1.1 cm. Tan, lobular to granular outer surface with pale yellow, soft parenchyma. Left adnexa: fimbriated fallopian tube - 11.2 x 0.3 cm, red-tan, and with unremarkable cut and serosa surfaces. Ovary - 2.7 x 1.8 x 1.3 cm. Tan, lobular to granular outer surface with pale yellow, soft parenchyma. Block Summary: A1: anterior and posterior cervix A2: anterior uterus A3: posterior uterus A4: additional endomyometrial junction A5: right FT fimbria and representative cross sections A6: representative right  ovary A7: left FT fimbria and representative cross sections A8: representative left ovary  (LEF 09/07/2023)   Final Diagnosis performed by Prentice Pitcher, MD.   Electronically signed 09/10/2023 Technical component performed at [facility], [address] [place], [place] [address].  Professional component performed at [employer]. [facility], [address], [place], [place] [address].  Immunohistochemistry Technical component (if applicable) was performed at [facility]. [address], [address], [place], [place] [address].   IMMUNOHISTOCHEMISTRY DISCLAIMER (if applicable): Some of these immunohistochemical stains may have been developed and the performance characteristics determine by [facility]. Some may not have been cleared or approved by the U.S. Food and Drug Administration. The FDA has determined that such clearance or approval is not necessary. This test is used for clinical purposes. It should not be regarded as investigational or for research. This laboratory is certified under the Clinical Laboratory Improvement Amendments of 1988 (CLIA-88) as qualified to perform high complexity clinical laboratory testing.  The controls stained appropriately.   IHC stains are performed on formalin fixed, paraffin embedded tissue using a 3,3diaminobenzidine (DAB) chromogen and Leica Bond Autostainer System. The staining intensity of the nucleus is score manually and is reported as the percentage of tumor cell nuclei demonstrating specific nuclear staining. The specimens are fixed in 10% Neutral Formalin for at least 6 hours and up to 72hrs. These tests are validated on decalcified tissue. Results should be interpreted with caution given the possibility of false negative results on decalcified specimens. Antibody Clones are as follows ER-clone 34F, PR-clone 16, Ki67- clone MM1. Some of these immunohistochemical stains may have  been developed and the performance characteristics determined by [facility].  Resulting Agency [facility]  Specimen Collected: 09/07/23 11:25 Last Resulted: 09/10/23 11:30      Lab Flowsheet      Order Details      View Encounter      Lab and Collection Details      Routing      Result History    View All Conversations on this Encounter    Linked Documents  View Image    Result Care Coordination   Result Notes   Renee Thomas, [facility] 09/11/2023 11:27 AM EST Back to Top    Patient notified of results.   Renee MARLA Carpen, MD 09/10/2023 11:40 AM EST     Adenomyosis seen, which explains the bleeding. No cancer seen     Patient Communication   Add Comments   Seen Back to Top    Adenomyosis seen, which explains the bleeding. No cancer seen  Written by Renee MARLA Carpen, MD on 09/10/2023 11:40 AM EST Seen by patient Renee Thomas on 09/27/2023  2:25 PM    Encounters Related to Results  08/20/2023 Hospital Encounter (Ordering Encounter) (Resulting Encounter) Back to Top   All Reviewers List  Renee Thomas, CMA on 09/11/2023 11:27  Renee Mustard, FNP on 09/10/2023 13:00  Thomas Renee MARLA, MD on 09/10/2023 11:40   Result Information  Status Priority Source  Edited Result - FINAL (09/10/2023 1130) Timed PATH Gyn benign resection   Authorizing Provider Information  Name: Thomas Renee MARLA, MD Fax: [phone]  Phone: [phone] Pager:    Status of Other Orders A/p PO from [facility] 10 weeks doing well Resume activities 2.  RTC with any concerns. Encouraged annual mammograms and resume annual care with PCP  Dr. Carpen

## 2023-11-16 ENCOUNTER — Other Ambulatory Visit: Payer: Self-pay

## 2023-11-16 ENCOUNTER — Encounter: Payer: Self-pay | Admitting: Allergy & Immunology

## 2023-11-16 ENCOUNTER — Ambulatory Visit: Payer: No Typology Code available for payment source | Admitting: Allergy & Immunology

## 2023-11-16 VITALS — BP 138/88 | HR 99 | Temp 98.1°F | Resp 16 | Ht 65.0 in | Wt 295.2 lb

## 2023-11-16 DIAGNOSIS — L2089 Other atopic dermatitis: Secondary | ICD-10-CM | POA: Diagnosis not present

## 2023-11-16 DIAGNOSIS — J310 Chronic rhinitis: Secondary | ICD-10-CM | POA: Diagnosis not present

## 2023-11-16 DIAGNOSIS — T782XXD Anaphylactic shock, unspecified, subsequent encounter: Secondary | ICD-10-CM | POA: Diagnosis not present

## 2023-11-16 MED ORDER — TRIAMCINOLONE ACETONIDE 0.5 % EX OINT: 1.0000 | TOPICAL_OINTMENT | Freq: Two times a day (BID) | CUTANEOUS | 0 refills | Status: AC

## 2023-11-16 MED ORDER — EPINEPHRINE 0.3 MG/0.3ML IJ SOAJ: 0.3000 mg | Freq: Once | INTRAMUSCULAR | 2 refills | Status: AC

## 2023-11-16 NOTE — Progress Notes (Signed)
 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  - The oral allergy  syndrome (OAS) or pollen-food allergy  syndrome (PFAS) is a relatively common form of food allergy , particularly in adults.  - It typically occurs in people who have pollen allergies when the immune system sees proteins on the food that look like proteins on the pollen.  - This results in the allergy  antibody (IgE) binding to the food instead of the pollen.  - Patients typically report itching and/or mild swelling of the mouth and throat immediately following ingestion of certain uncooked fruits (including nuts) or raw vegetables.  - Only a very small number of affected individuals experience systemic allergic reactions, such as anaphylaxis which occurs with true food allergies.   - EpiPen  is up to date. - Emergency Action Plan provided.   2. Chronic rhinitis - Because of insurance stipulations, we cannot do skin testing on the same day as your first visit. - We are all working to fight this, but for now we need to do two separate visits.  - We will know more after we do testing at the next visit.  - The skin testing visit can be squeezed in at your convenience.  - Then we can make a more full plan to address all of your symptoms. - Be sure to stop your antihistamines for 3 days before this appointment.  - We can come up with a medication regimen once we know what your triggers are.  - We could consider allergy  shots for long term control.   3. Eczema  - This appears stable.  - Start triamcinolone  0.5% ointment twice daily which  you can use on the fingers for 2 weeks tops.  - DO NOT use this on the face.   4. Return in about 1 week (around 11/23/2023) for SKIN TESTING (1-55 + selected foods). You can have the follow up appointment with Dr. Iva or a Nurse Practicioner (our Nurse Practitioners are excellent and always have Physician oversight!).    This note in its entirety was forwarded to the Provider who requested this consultation.  Subjective:   Renee Thomas is a 53 y.o. female presenting today for evaluation of  Chief Complaint  Patient presents with   Establish Care    States she may have food allergies to a variety of foods.   Nasal Congestion    Sneezing itchy watery eyes itchy throat during all seasons     Renee Thomas has a history of the following: Patient Active Problem List   Diagnosis Date Noted   Neck pain 02/02/2022   GAD (generalized anxiety disorder) 12/16/2021   Hypertension 09/10/2020   GERD without esophagitis 05/02/2019   BMI 45.0-49.9, adult (HCC) 05/02/2019   Low serum vitamin B12 11/17/2016    History obtained from: chart review and patient.  Discussed the use of AI scribe software for clinical note transcription with the patient and/or guardian, who gave verbal consent to proceed.  Renee Thomas was referred by Renee Mustard, FNP.     Renee Thomas is a 53 y.o. female presenting for an  evaluation of environmental and food allergies .   Renee Thomas has a history of eczema as well as a long-standing history of food and environmental allergies.   Allergic Rhinitis Symptom History: Renee Thomas first started having environmental allergy  in her 64s and her allergies are most severe during the spring, summer, and fall, and she has been managing her environmental allergies with Flonase  and olecranon, which she reports as being only moderately effective.  In addition to her allergies, Renee Thomas has been experiencing a sensation of unbalance for the past four years, which has led to her  being on disability. She describes feeling as though she is going to fall or pass out, particularly in the mornings. This sensation has not been diagnosed despite consultations with multiple specialists and various tests. She was tested in the past, but she does not remember the results (this was decades ago).   Food Allergy  Symptom History: She reports symptoms of oral allergy  syndrome, including an itchy throat, itchy ears, and a burning sensation on the tongue, primarily in response to certain fruits, nuts, and vegetables.   Skin Symptom History: Renee Thomas also experiences skin reactions, such as cold fingertips and rashes, in response to certain medications. Her eczema was severe during her childhood and adolescence. It has largely resolved, with only occasional small breakouts. She has not seen a dermatologist in several years and does not currently have any ointment for her eczema. She manages her skin health with various moisturizers, including Cetaphil and shea butter lotions.   Otherwise, there is no history of other atopic diseases, including asthma, food allergies, drug allergies, stinging insect allergies, or contact dermatitis. There is no significant infectious history. Vaccinations are up to date.    Past Medical History: Patient Active Problem List   Diagnosis Date Noted   Neck pain 02/02/2022   GAD (generalized anxiety disorder) 12/16/2021   Hypertension 09/10/2020   GERD without esophagitis 05/02/2019   BMI 45.0-49.9, adult (HCC) 05/02/2019   Low serum vitamin B12 11/17/2016    Medication List:  Allergies as of 11/16/2023       Reactions   Egg-derived Products Swelling   Swells mouth with itching   Fluarix [influenza Virus Vaccine] Swelling   Contains egg protein.   Other Anaphylaxis   All Tree-Nuts   Peanut-containing Drug Products Anaphylaxis   Penicillins Rash, Anaphylaxis   .Has patient had a PCN reaction causing immediate rash, facial/tongue/throat swelling, SOB or  lightheadedness with hypotension: Yes Has patient had a PCN reaction causing severe rash involving mucus membranes or skin necrosis: No Has patient had a PCN reaction that required hospitalization: no Has patient had a PCN reaction occurring within the last 10 years: no If all of the above answers are NO, then may proceed with Cephalosporin use. Other reaction(s): Other (See Comments) .Has patient had a PCN reaction causing immediate rash, facial/tongue/throat swelling, SOB or lightheadedness with hypotension: Yes Has patient had a PCN reaction causing severe rash involving mucus membranes or skin necrosis: No Has patient had a PCN reaction that required hospitalization: no Has patient had a PCN reaction occurring within the last 10 years: no If all of the above answers are NO, then may proceed with Cephalosporin use.   Apple Itching   Mouth itching   Blueberry Flavoring Agent (non-screening) Itching   Kiwi Extract Itching   Per pt tongue and throat itching/ burning   Watermelon Flavoring Agent (non-screening) Itching   And Canteloupe   Amoxicillin Rash   Sulfa Antibiotics Rash   numbness  Medication List        Accurate as of November 16, 2023 11:59 PM. If you have any questions, ask your nurse or doctor.          acetaminophen  650 MG CR tablet Commonly known as: TYLENOL  Take 650 mg by mouth 2 (two) times daily.   amLODipine  2.5 MG tablet Commonly known as: NORVASC  Take 1 tablet (2.5 mg total) by mouth daily.   B-12 COMPLIANCE INJECTION IJ Inject as directed every 30 (thirty) days.   CLEAR EYES FOR DRY EYES OP Place 1-2 drops into both eyes daily as needed (dry eyes).   EPINEPHrine  0.3 mg/0.3 mL Soaj injection Commonly known as: EpiPen  2-Pak Inject 0.3 mg into the muscle once for 1 dose. Started by: Marty Morton Shaggy   escitalopram  10 MG tablet Commonly known as: LEXAPRO  Take 1 tablet (10 mg total) by mouth daily.   fexofenadine  180 MG  tablet Commonly known as: ALLEGRA  TAKE 1 TABLET BY MOUTH EVERY DAY   fluticasone  50 MCG/ACT nasal spray Commonly known as: FLONASE  SPRAY 2 SPRAYS INTO EACH NOSTRIL EVERY DAY What changed: See the new instructions.   hydrochlorothiazide  25 MG tablet Commonly known as: HYDRODIURIL  Take 1 tablet (25 mg total) by mouth daily.   ipratropium 0.03 % nasal spray Commonly known as: ATROVENT  Place 2 sprays into both nostrils every 12 (twelve) hours.   Krill Oil 350 MG Caps Take 1 capsule by mouth daily.   metoprolol  tartrate 25 MG tablet Commonly known as: LOPRESSOR  TAKE 1/2 TABLET (12.5 MG TOTAL) BY MOUTH 2 (TWO) TIMES DAILY AS NEEDED (PALPITATIONS).   nystatin  powder Commonly known as: MYCOSTATIN /NYSTOP  Apply 1 Application topically 3 (three) times daily. Apply to affected area for up to 7 days What changed:  when to take this reasons to take this   omeprazole  20 MG capsule Commonly known as: PRILOSEC Take 1 capsule (20 mg total) by mouth daily.   PROBIOTIC PO Take 1 capsule by mouth daily.   rosuvastatin  10 MG tablet Commonly known as: CRESTOR  Take 1 tablet (10 mg total) by mouth daily.   triamcinolone  ointment 0.5 % Commonly known as: KENALOG  Apply 1 Application topically 2 (two) times daily. Started by: Pershing Skidmore Louis Marilynn Ekstein   Vitamin D3 50 MCG (2000 UT) Tabs Take 1 tablet by mouth daily after lunch.        Birth History: non-contributory  Developmental History: non-contributory  Past Surgical History: Past Surgical History:  Procedure Laterality Date   COLONOSCOPY WITH PROPOFOL  N/A 08/24/2023   Procedure: COLONOSCOPY WITH PROPOFOL ;  Surgeon: Rollin Dover, MD;  Location: [facility];  Service: Gastroenterology;  Laterality: N/A;   CYSTOSCOPY N/A 09/07/2023   Procedure: CYSTOSCOPY;  Surgeon: Glennon Almarie POUR, MD;  Location: [facility];  Service: Gynecology;  Laterality: N/A;   DILATATION & CURETTAGE/HYSTEROSCOPY WITH MYOSURE N/A  07/11/2022   Procedure: DILATATION & CURETTAGE/HYSTEROSCOPY WITH MYOSURE RESECTION OF ENDOMETRIAL POLYPS;  Surgeon: Cathlyn JAYSON Nikki Bobie FORBES, MD;  Location: [facility];  Service: Gynecology;  Laterality: N/A;   POLYPECTOMY  08/24/2023   Procedure: POLYPECTOMY;  Surgeon: Rollin Dover, MD;  Location: [facility];  Service: Gastroenterology;;   ROBOTIC ASSISTED TOTAL HYSTERECTOMY WITH BILATERAL SALPINGO OOPHERECTOMY Bilateral 09/07/2023   Procedure: XI ROBOTIC ASSISTED TOTAL HYSTERECTOMY WITH BILATERAL SALPINGO OOPHORECTOMY;  Surgeon: Glennon Almarie POUR, MD;  Location: [facility];  Service: Gynecology;  Laterality: Bilateral;     Family History: Family History  Problem Relation Age of Onset   Hypertension  Mother    Diabetes Mother    Other Mother        renal disease   Hypertension Father    Heart attack Father    Allergic rhinitis Sister    Hypertension Sister    Allergic rhinitis Sister    Allergic rhinitis Brother    Diabetes Brother    Allergic rhinitis Brother    Diabetes Brother    Allergic rhinitis Brother    Allergic rhinitis Brother    Sleep apnea Neg Hx      Social History: Emileigh lives at home. She is not exposed to smoking at all. She is currently in disability.    Social History   Socioeconomic History   Marital status: Single    Spouse name: Not on file   Number of children: Not on file   Years of education: Not on file   Highest education level: 12th grade  Occupational History   Not on file  Tobacco Use   Smoking status: Never    Passive exposure: Past   Smokeless tobacco: Never  Vaping Use   Vaping status: Never Used  Substance and Sexual Activity   Alcohol use: No    Alcohol/week: 0.0 standard drinks of alcohol   Drug use: Never   Sexual activity: Not Currently    Partners: Male    Birth control/protection: Abstinence  Other Topics Concern   Not on file  Social History Narrative   Not on file   Social  Drivers of Health   Financial Resource Strain: Low Risk  (09/25/2023)   Overall Financial Resource Strain (CARDIA)    Difficulty of Paying Living Expenses: Not very hard  Food Insecurity: No Food Insecurity (09/25/2023)   Hunger Vital Sign    Worried About Running Out of Food in the Last Year: Never true    Ran Out of Food in the Last Year: Never true  Transportation Needs: No Transportation Needs (09/25/2023)   PRAPARE - Administrator, Civil Service (Medical): No    Lack of Transportation (Non-Medical): No  Physical Activity: Unknown (09/25/2023)   Exercise Vital Sign    Days of Exercise per Week: 0 days    Minutes of Exercise per Session: Not on file  Stress: No Stress Concern Present (09/25/2023)   [employer] of Occupational Health - Occupational Stress Questionnaire    Feeling of Stress : Only a little  Social Connections: Socially Isolated (09/25/2023)   Social Connection and Isolation Panel [NHANES]    Frequency of Communication with Friends and Family: More than three times a week    Frequency of Social Gatherings with Friends and Family: Once a week    Attends Religious Services: Never    Database Administrator or Organizations: No    Attends Engineer, Structural: Not on file    Marital Status: Never married      Review of systems otherwise negative other than that mentioned in the HPI.    Objective:   Blood pressure 138/88, pulse 99, temperature 98.1 F (36.7 C), resp. rate 16, height 5' 5 (1.651 m), weight 295 lb 3.2 oz (133.9 kg), last menstrual period 12/27/2022, SpO2 96%. Body mass index is 49.12 kg/m.     Physical Exam Vitals reviewed.  Constitutional:      Appearance: She is well-developed.     Comments: Pleasant.   HENT:     Head: Normocephalic and atraumatic.     Right Ear: Tympanic membrane, ear canal and external  ear normal. No drainage, swelling or tenderness. Tympanic membrane is not injected, scarred,  erythematous, retracted or bulging.     Left Ear: Tympanic membrane, ear canal and external ear normal. No drainage, swelling or tenderness. Tympanic membrane is not injected, scarred, erythematous, retracted or bulging.     Nose: No nasal deformity, septal deviation, mucosal edema or rhinorrhea.     Right Turbinates: Enlarged, swollen and pale.     Left Turbinates: Enlarged, swollen and pale.     Right Sinus: No maxillary sinus tenderness or frontal sinus tenderness.     Left Sinus: No maxillary sinus tenderness or frontal sinus tenderness.     Comments: No polyps noted.     Mouth/Throat:     Mouth: Mucous membranes are not pale and not dry.     Pharynx: Uvula midline.  Eyes:     General:        Right eye: No discharge.        Left eye: No discharge.     Conjunctiva/sclera: Conjunctivae normal.     Right eye: Right conjunctiva is not injected. No chemosis.    Left eye: Left conjunctiva is not injected. No chemosis.    Pupils: Pupils are equal, round, and reactive to light.  Cardiovascular:     Rate and Rhythm: Normal rate and regular rhythm.     Heart sounds: Normal heart sounds.  Pulmonary:     Effort: Pulmonary effort is normal. No tachypnea, accessory muscle usage or respiratory distress.     Breath sounds: Normal breath sounds. No wheezing, rhonchi or rales.  Chest:     Chest wall: No tenderness.  Abdominal:     Tenderness: There is no abdominal tenderness. There is no guarding or rebound.  Lymphadenopathy:     Head:     Right side of head: No submandibular, tonsillar or occipital adenopathy.     Left side of head: No submandibular, tonsillar or occipital adenopathy.     Cervical: No cervical adenopathy.  Skin:    Coloration: Skin is not pale.     Findings: No abrasion, erythema, petechiae or rash. Rash is not papular, urticarial or vesicular.  Neurological:     Mental Status: She is alert.  Psychiatric:        Behavior: Behavior is cooperative.      Diagnostic  studies: deferred due to insurance stipulations that require a separate visit for testing         Marty Shaggy, MD [facility] 

## 2023-11-16 NOTE — Patient Instructions (Addendum)
 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  - The oral allergy  syndrome (OAS) or pollen-food allergy  syndrome (PFAS) is a relatively common form of food allergy , particularly in adults.  - It typically occurs in people who have pollen allergies when the immune system sees proteins on the food that look like proteins on the pollen.  - This results in the allergy  antibody (IgE) binding to the food instead of the pollen.  - Patients typically report itching and/or mild swelling of the mouth and throat immediately following ingestion of certain uncooked fruits (including nuts) or raw vegetables.  - Only a very small number of affected individuals experience systemic allergic reactions, such as anaphylaxis which occurs with true food allergies.   - EpiPen  is up to date. - Emergency Action Plan provided.     2. Chronic rhinitis - Because of insurance stipulations, we cannot do skin testing on the same day as your first visit. - We are all working to fight this, but for now we need to do two separate visits.  - We will know more after we do testing at the next visit.  - The skin testing visit can be squeezed in at your convenience.  - Then we can make a more full plan to address all of your symptoms. - Be sure to stop your antihistamines for 3 days before this appointment.  - We can come up with a medication regimen once we know what your triggers are.  - We could consider allergy  shots for long term control.   3. Eczema  - This appears stable.  - Start triamcinolone  0.5% ointment twice daily which you can use on the fingers for 2 weeks tops.  - DO NOT use this on the face.   4. Return in about 1 week (around 11/23/2023) for SKIN TESTING (1-55 + selected foods). You can have the follow up appointment with Dr. Iva or a Nurse Practicioner (our Nurse Practitioners are excellent and always have Physician oversight!).     Please inform us  of any Emergency Department visits, hospitalizations, or changes in symptoms. Call us  before going to the ED for breathing or allergy  symptoms since we might be able to fit you in for a sick visit. Feel free to contact us  anytime with any questions, problems, or concerns.  It was a pleasure to meet you today!  Websites that have reliable patient information: 1. American Academy of Asthma, Allergy , and Immunology: www.aaaai.org 2. Food Allergy  Research and Education (FARE): foodallergy.org 3. Mothers of Asthmatics: http://www.asthmacommunitynetwork.org 4. American College of Allergy , Asthma, and Immunology: www.acaai.org      "Like" us  on Facebook and Instagram for our latest updates!      A healthy democracy works best when [employer] participate! Make sure you are registered to vote! If you have moved or changed any of your contact information, you will need to get this updated before voting! Scan the QR codes below to learn more!      Allergy  Shots  Allergies are the result of a chain reaction that starts in the immune system. Your immune system controls how your body defends itself. For instance, if you have an allergy  to pollen, your immune system identifies pollen as an invader or allergen. Your immune system overreacts by producing antibodies called Immunoglobulin E (IgE). These antibodies travel to cells that release chemicals, causing an allergic reaction.  The concept behind allergy  immunotherapy, whether  it is received in the form of shots or tablets, is that the immune system can be desensitized to specific allergens that trigger allergy  symptoms. Although it requires time and patience, the payback can be long-term relief. Allergy  injections contain a dilute solution of those substances that you are allergic to based upon your skin testing and allergy  history.   How Do Allergy  Shots Work?  Allergy  shots work much like a vaccine. Your body responds to  injected amounts of a particular allergen given in increasing doses, eventually developing a resistance and tolerance to it. Allergy  shots can lead to decreased, minimal or no allergy  symptoms.  There generally are two phases: build-up and maintenance. Build-up often ranges from three to six months and involves receiving injections with increasing amounts of the allergens. The shots are typically given once or twice a week, though more rapid build-up schedules are sometimes used.  The maintenance phase begins when the most effective dose is reached. This dose is different for each person, depending on how allergic you are and your response to the build-up injections. Once the maintenance dose is reached, there are longer periods between injections, typically two to four weeks.  Occasionally doctors give cortisone-type shots that can temporarily reduce allergy  symptoms. These types of shots are different and should not be confused with allergy  immunotherapy shots.  Who Can Be Treated with Allergy  Shots?  Allergy  shots may be a good treatment approach for people with allergic rhinitis (hay fever), allergic asthma, conjunctivitis (eye allergy ) or stinging insect allergy .   Before deciding to begin allergy  shots, you should consider:   The length of allergy  season and the severity of your symptoms  Whether medications and/or changes to your environment can control your symptoms  Your desire to avoid long-term medication use  Time: allergy  immunotherapy requires a major time commitment  Cost: may vary depending on your insurance coverage  Allergy  shots for children age 15 and older are effective and often well tolerated. They might prevent the onset of new allergen sensitivities or the progression to asthma.  Allergy  shots are not started on patients who are pregnant but can be continued on patients who become pregnant while receiving them. In some patients with other medical conditions or who  take certain common medications, allergy  shots may be of risk. It is important to mention other medications you talk to your allergist.   What are the two types of build-ups offered:   RUSH or Rapid Desensitization -- one day of injections lasting from 8:30-4:30pm, injections every 1 hour.  Approximately half of the build-up process is completed in that one day.  The following week, normal build-up is resumed, and this entails ~16 visits either weekly or twice weekly, until reaching your "maintenance dose" which is continued weekly until eventually getting spaced out to every month for a duration of 3 to 5 years. The regular build-up appointments are nurse visits where the injections are administered, followed by required monitoring for 30 minutes.    Traditional build-up -- weekly visits for 6 -12 months until reaching "maintenance dose", then continue weekly until eventually spacing out to every 4 weeks as above. At these appointments, the injections are administered, followed by required monitoring for 30 minutes.     Either way is acceptable, and both are equally effective. With the rush protocol, the advantage is that less time is spent here for injections overall AND you would also reach maintenance dosing faster (which is when the clinical benefit starts to become  more apparent). Not everyone is a candidate for rapid desensitization.   IF we proceed with the RUSH protocol, there are premedications which must be taken the day before and the day after the rush only (this includes antihistamines, steroids, and Singulair ).  After the rush day, no prednisone  or Singulair  is required, and we just recommend antihistamines taken on your injection day.  What Is An Estimate of the Costs?  If you are interested in starting allergy  injections, please check with your insurance company about your coverage for both allergy  vial sets and allergy  injections.  Please do so prior to making the appointment to  start injections.  The following are CPT codes to give to your insurance company. These are the amounts we BILL to the insurance company, but the amount YOU WILL PAY and WE RECEIVE IS SUBSTANTIALLY LESS and depends on the contracts we have with different insurance companies.   Amount Billed to Insurance One allergy  vial set  CPT 95165   $ 1200     Two allergy  vial set  CPT 95165   $ 2400     Three allergy  vial set  CPT 95165   $ 3600     One injection   CPT 95115   $ 35  Two injections   CPT 95117   $ 40 RUSH (Rapid Desensitization) CPT 95180 x 8 hours $500/hour  Regarding the allergy  injections, your co-pay may or may not apply with each injection, so please confirm this with your insurance company. When you start allergy  injections, 1 or 2 sets of vials are made based on your allergies.  Not all patients can be on one set of vials. A set of vials lasts 6 months to a year depending on how quickly you can proceed with your build-up of your allergy  injections. Vials are personalized for each patient depending on their specific allergens.  How often are allergy  injection given during the build-up period?   Injections are given at least weekly during the build-up period until your maintenance dose is achieved. Per the doctor's discretion, you may have the option of getting allergy  injections two times per week during the build-up period. However, there must be at least 48 hours between injections. The build-up period is usually completed within 6-12 months depending on your ability to schedule injections and for adjustments for reactions. When maintenance dose is reached, your injection schedule is gradually changed to every two weeks and later to every three weeks. Injections will then continue every 4 weeks. Usually, injections are continued for a total of 3-5 years.   When Will I Feel Better?  Some may experience decreased allergy  symptoms during the build-up phase. For others, it may take as long  as 12 months on the maintenance dose. If there is no improvement after a year of maintenance, your allergist will discuss other treatment options with you.  If you aren't responding to allergy  shots, it may be because there is not enough dose of the allergen in your vaccine or there are missing allergens that were not identified during your allergy  testing. Other reasons could be that there are high levels of the allergen in your environment or major exposure to non-allergic triggers like tobacco smoke.  What Is the Length of Treatment?  Once the maintenance dose is reached, allergy  shots are generally continued for three to five years. The decision to stop should be discussed with your allergist at that time. Some people may experience a permanent reduction of allergy  symptoms.  Others may relapse and a longer course of allergy  shots can be considered.  What Are the Possible Reactions?  The two types of adverse reactions that can occur with allergy  shots are local and systemic. Common local reactions include very mild redness and swelling at the injection site, which can happen immediately or several hours after. Report a delayed reaction from your last injection. These include arm swelling or runny nose, watery eyes or cough that occurs within 12-24 hours after injection. A systemic reaction, which is less common, affects the entire body or a particular body system. They are usually mild and typically respond quickly to medications. Signs include increased allergy  symptoms such as sneezing, a stuffy nose or hives.   Rarely, a serious systemic reaction called anaphylaxis can develop. Symptoms include swelling in the throat, wheezing, a feeling of tightness in the chest, nausea or dizziness. Most serious systemic reactions develop within 30 minutes of allergy  shots. This is why it is strongly recommended you wait in your doctor's office for 30 minutes after your injections. Your allergist is trained to  watch for reactions, and his or her staff is trained and equipped with the proper medications to identify and treat them.   Report to the nurse immediately if you experience any of the following symptoms: swelling, itching or redness of the skin, hives, watery eyes/nose, breathing difficulty, excessive sneezing, coughing, stomach pain, diarrhea, or light headedness. These symptoms may occur within 15-20 minutes after injection and may require medication.   Who Should Administer Allergy  Shots?  The preferred location for receiving shots is your prescribing allergist's office. Injections can sometimes be given at another facility where the physician and staff are trained to recognize and treat reactions, and have received instructions by your prescribing allergist.  What if I am late for an injection?   Injection dose will be adjusted depending upon how many days or weeks you are late for your injection.   What if I am sick?   Please report any illness to the nurse before receiving injections. She may adjust your dose or postpone injections depending on your symptoms. If you have fever, flu, sinus infection or chest congestion it is best to postpone allergy  injections until you are better. Never get an allergy  injection if your asthma is causing you problems. If your symptoms persist, seek out medical care to get your health problem under control.  What If I am or Become Pregnant:  Women that become pregnant should schedule an appointment with [facility] before receiving any further allergy  injections.

## 2023-11-19 ENCOUNTER — Encounter: Payer: Self-pay | Admitting: Allergy & Immunology

## 2023-11-21 ENCOUNTER — Ambulatory Visit (INDEPENDENT_AMBULATORY_CARE_PROVIDER_SITE_OTHER): Payer: No Typology Code available for payment source

## 2023-11-21 DIAGNOSIS — E538 Deficiency of other specified B group vitamins: Secondary | ICD-10-CM | POA: Diagnosis not present

## 2023-11-21 NOTE — Progress Notes (Signed)
B12 given in left deltoid. Patient tolerated well.

## 2023-11-23 ENCOUNTER — Ambulatory Visit (INDEPENDENT_AMBULATORY_CARE_PROVIDER_SITE_OTHER): Payer: No Typology Code available for payment source | Admitting: Allergy & Immunology

## 2023-11-23 DIAGNOSIS — J302 Other seasonal allergic rhinitis: Secondary | ICD-10-CM | POA: Diagnosis not present

## 2023-11-23 DIAGNOSIS — T7805XD Anaphylactic reaction due to tree nuts and seeds, subsequent encounter: Secondary | ICD-10-CM

## 2023-11-23 DIAGNOSIS — J3089 Other allergic rhinitis: Secondary | ICD-10-CM | POA: Diagnosis not present

## 2023-11-23 MED ORDER — AZELASTINE HCL 0.1 % NA SOLN: 2.0000 | Freq: Two times a day (BID) | NASAL | 5 refills | Status: DC

## 2023-11-23 MED ORDER — FLUTICASONE PROPIONATE 50 MCG/ACT NA SUSP: 2.0000 | Freq: Every day | NASAL | 5 refills | Status: AC

## 2023-11-23 NOTE — Patient Instructions (Addendum)
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  - Testing results confirm that today. - We are going to get some blood work to confirm the peanut and tree nut test results. - Definitely avoid watermelon, but you can also avoid the other foods that you react to as well. - EpiPen training provided previously.  - The oral allergy syndrome (OAS) or pollen-food allergy syndrome (PFAS) is a relatively common form of food allergy, particularly in adults.  - It typically occurs in people who have pollen allergies when the immune system "sees" proteins on the food that look like proteins on the pollen.  - This results in the allergy antibody (IgE) binding to the food instead of the pollen.  - Patients typically report itching and/or mild swelling of the mouth and throat immediately following ingestion of certain uncooked fruits (including nuts) or raw vegetables.  - Only a very small number of affected individuals experience systemic allergic reactions, such as anaphylaxis which occurs with true food allergies.       2. Chronic rhinitis - Testing today showed: grasses, ragweed, weeds, trees, indoor molds, outdoor molds, dust mites, and cockroach - Copy of test results provided.  - Avoidance measures provided. - Continue with: Allegra (fexofenadine) 180mg  tablet once daily and Flonase (fluticasone) one spray per nostril daily (AIM FOR EAR ON EACH SIDE) - Start taking: Astelin (azelastine) 2 sprays per nostril 1-2 times daily as needed - You can use an extra dose of the antihistamine, if needed, for breakthrough symptoms.  - Consider nasal saline rinses 1-2 times daily to remove allergens from the nasal cavities as well as help with mucous clearance (this is especially helpful to do before the nasal sprays are given) - Consider allergy shots as a means of long-term control. - Allergy shots "re-train" and "reset" the immune system to  ignore environmental allergens and decrease the resulting immune response to those allergens (sneezing, itchy watery eyes, runny nose, nasal congestion, etc).    - Allergy shots improve symptoms in 75-85% of patients.  - We can discuss more at the next appointment if the medications are not working for you.  3. Eczema  - This appears stable.  - Continue triamcinolone 0.5% ointment twice daily which you can use on the fingers for 2 weeks tops.  - DO NOT use this on the face.   4. Return in about 3 months (around 02/21/2024). You can have the follow up appointment with Dr. Dellis Anes or a Nurse Practicioner (our Nurse Practitioners are excellent and always have Physician oversight!).    Please inform us of any Emergency Department visits, hospitalizations, or changes in symptoms. Call us before going to the ED for breathing or allergy symptoms since we might be able to fit you in for a sick visit. Feel free to contact us anytime with any questions, problems, or concerns.  It was a pleasure to meet you today!  Websites that have reliable patient information: 1. American Academy of Asthma, Allergy, and Immunology: www.aaaai.org 2. Food Allergy Research and Education (FARE): foodallergy.org 3. Mothers of Asthmatics: http://www.asthmacommunitynetwork.org 4. American College of Allergy, Asthma, and Immunology: www.acaai.org      "Like" [place] on Facebook and Instagram for our latest updates!      A healthy democracy works best when [employer] participate! Make sure you are registered to vote! If you have moved or changed any of your contact information, you will need  to get this updated before voting! Scan the QR codes below to learn more!      Airborne Adult Perc - 11/23/23 0949     Time Antigen Placed 5188    Allergen Manufacturer Waynette Buttery    Location Back    Number of Test 55    1. Control-Buffer 50% Glycerol Negative    2. Control-Histamine 2+    3. Bahia Negative    4. [place] 2+     5. Johnson Negative    6. [place] Blue 3+    7. Meadow Fescue 3+    8. Perennial Rye Negative    9. Timothy 3+    10. Ragweed Mix Negative    11. Cocklebur 2+    12. Plantain,  English Negative    13. Baccharis Negative    14. Dog Fennel Negative    15. Russian Thistle 3+    16. Lamb's Quarters Negative    17. Sheep Sorrell 3+    18. Rough Pigweed Negative    19. Marsh Elder, Rough 3+    20. Mugwort, Common 2+    21. Box, Elder Negative    22. Cedar, red Negative    23. Sweet Gum 3+    24. Pecan Pollen 2+    25. Pine Mix 2+    26. Walnut, Black Pollen Negative    27. Red Mulberry 3+    28. Ash Mix 3+    29. Birch Mix 3+    30. Beech American 3+    31. Cottonwood, [place] Negative    32. Hickory, White 2+    33. Maple Mix 2+    34. Oak, [place] Mix 4+    35. Sycamore Eastern 2+    36. Alternaria Alternata 2+    37. Cladosporium Herbarum 2+    38. Aspergillus Mix 2+    39. Penicillium Mix 2+    40. Bipolaris Sorokiniana (Helminthosporium) Negative    41. Drechslera Spicifera (Curvularia) 2+    42. Mucor Plumbeus Negative    43. Fusarium Moniliforme 2+    44. Aureobasidium Pullulans (pullulara) 2+    45. Rhizopus Oryzae Negative    46. Botrytis Cinera 2+    47. Epicoccum Nigrum 2+    48. Phoma Betae 2+    49. Dust Mite Mix 2+    50. Cat Hair 10,000 BAU/ml Negative    51.  Dog Epithelia Negative    52. Mixed Feathers Negative    53. Horse Epithelia Negative    54. Cockroach, German 2+    55. Tobacco Leaf Negative             Food Adult Perc - 11/23/23 0900     Time Antigen Placed 4166    Allergen Manufacturer Waynette Buttery    Location Back    Number of allergen test 17    1. Peanut Negative    7. Egg White, Chicken Negative    10. Cashew --   13 x 25   11. Walnut Food --   7 x 11   12. Almond Negative    13. Hazelnut Negative    14. Pecan Food --   9 x 15   15. Pistachio --   15 x 30   16. [place] Nut --   4 x 6   45. Green Pepper Negative    47. Onion  Negative    58. Apple Negative    59. Peach Negative    61. Blueberry Negative  63. Cantaloupe Negative    64. Watermelon --   14 x 20   70. Garlic Negative             Reducing Pollen Exposure  The American Academy of Allergy, Asthma and Immunology suggests the following steps to reduce your exposure to pollen during allergy seasons.    Do not hang sheets or clothing out to dry; pollen may collect on these items. Do not mow lawns or spend time around freshly cut grass; mowing stirs up pollen. Keep windows closed at night.  Keep car windows closed while driving. Minimize morning activities outdoors, a time when pollen counts are usually at their highest. Stay indoors as much as possible when pollen counts or humidity is high and on windy days when pollen tends to remain in the air longer. Use air conditioning when possible.  Many air conditioners have filters that trap the pollen spores. Use a HEPA room air filter to remove pollen form the indoor air you breathe.  Control of Mold Allergen   Mold and fungi can grow on a variety of surfaces provided certain temperature and moisture conditions exist.  Outdoor molds grow on plants, decaying vegetation and soil.  The major outdoor mold, Alternaria and Cladosporium, are found in very high numbers during hot and dry conditions.  Generally, a late Summer - Fall peak is seen for common outdoor fungal spores.  Rain will temporarily lower outdoor mold spore count, but counts rise rapidly when the rainy period ends.  The most important indoor molds are Aspergillus and Penicillium.  Dark, humid and poorly ventilated basements are ideal sites for mold growth.  The next most common sites of mold growth are the bathroom and the kitchen.  Outdoor (Seasonal) Mold Control   Use air conditioning and keep windows closed Avoid exposure to decaying vegetation. Avoid leaf raking. Avoid grain handling. Consider wearing a face mask if working in moldy  areas.    Indoor (Perennial) Mold Control    Maintain humidity below 50%. Clean washable surfaces with 5% bleach solution. Remove sources e.g. contaminated carpets.     Control of Dust Mite Allergen    Dust mites play a major role in allergic asthma and rhinitis.  They occur in environments with high humidity wherever human skin is found.  Dust mites absorb humidity from the atmosphere (ie, they do not drink) and feed on organic matter (including shed human and animal skin).  Dust mites are a microscopic type of insect that you cannot see with the naked eye.  High levels of dust mites have been detected from mattresses, pillows, carpets, upholstered furniture, bed covers, clothes, soft toys and any woven material.  The principal allergen of the dust mite is found in its feces.  A gram of dust may contain 1,000 mites and 250,000 fecal particles.  Mite antigen is easily measured in the air during house cleaning activities.  Dust mites do not bite and do not cause harm to humans, other than by triggering allergies/asthma.    Ways to decrease your exposure to dust mites in your home:  Encase mattresses, box springs and pillows with a mite-impermeable barrier or cover   Wash sheets, blankets and drapes weekly in hot water (130 F) with detergent and dry them in a dryer on the hot setting.  Have the room cleaned frequently with a vacuum cleaner and a damp dust-mop.  For carpeting or rugs, vacuuming with a vacuum cleaner equipped with a high-efficiency particulate air (HEPA) filter.  The dust mite allergic individual should not be in a room which is being cleaned and should wait 1 hour after cleaning before going into the room. Do not sleep on upholstered furniture (eg, couches).   If possible removing carpeting, upholstered furniture and drapery from the home is ideal.  Horizontal blinds should be eliminated in the rooms where the person spends the most time (bedroom, study, television room).   Washable vinyl, roller-type shades are optimal. Remove all non-washable stuffed toys from the bedroom.  Wash stuffed toys weekly like sheets and blankets above.   Reduce indoor humidity to less than 50%.  Inexpensive humidity monitors can be purchased at most hardware stores.  Do not use a humidifier as can make the problem worse and are not recommended.  Control of Cockroach Allergen  Cockroach allergen has been identified as an important cause of acute attacks of asthma, especially in urban settings.  There are fifty-five species of cockroach that exist in the [place], however only three, the [place], [place] species produce allergen that can affect patients with Asthma.  Allergens can be obtained from fecal particles, egg casings and secretions from cockroaches.    Remove food sources. Reduce access to water. Seal access and entry points. Spray runways with 0.5-1% Diazinon or Chlorpyrifos Blow boric acid power under stoves and refrigerator. Place bait stations (hydramethylnon) at feeding sites.    Allergy Shots  Allergies are the result of a chain reaction that starts in the immune system. Your immune system controls how your body defends itself. For instance, if you have an allergy to pollen, your immune system identifies pollen as an invader or allergen. Your immune system overreacts by producing antibodies called Immunoglobulin E (IgE). These antibodies travel to cells that release chemicals, causing an allergic reaction.  The concept behind allergy immunotherapy, whether it is received in the form of shots or tablets, is that the immune system can be desensitized to specific allergens that trigger allergy symptoms. Although it requires time and patience, the payback can be long-term relief. Allergy injections contain a dilute solution of those substances that you are allergic to based upon your skin testing and allergy history.   How Do Allergy Shots Work?  Allergy  shots work much like a vaccine. Your body responds to injected amounts of a particular allergen given in increasing doses, eventually developing a resistance and tolerance to it. Allergy shots can lead to decreased, minimal or no allergy symptoms.  There generally are two phases: build-up and maintenance. Build-up often ranges from three to six months and involves receiving injections with increasing amounts of the allergens. The shots are typically given once or twice a week, though more rapid build-up schedules are sometimes used.  The maintenance phase begins when the most effective dose is reached. This dose is different for each person, depending on how allergic you are and your response to the build-up injections. Once the maintenance dose is reached, there are longer periods between injections, typically two to four weeks.  Occasionally doctors give cortisone-type shots that can temporarily reduce allergy symptoms. These types of shots are different and should not be confused with allergy immunotherapy shots.  Who Can Be Treated with Allergy Shots?  Allergy shots may be a good treatment approach for people with allergic rhinitis (hay fever), allergic asthma, conjunctivitis (eye allergy) or stinging insect allergy.   Before deciding to begin allergy shots, you should consider:   The length of allergy season and the severity of your  symptoms  Whether medications and/or changes to your environment can control your symptoms  Your desire to avoid long-term medication use  Time: allergy immunotherapy requires a major time commitment  Cost: may vary depending on your insurance coverage  Allergy shots for children age 24 and older are effective and often well tolerated. They might prevent the onset of new allergen sensitivities or the progression to asthma.  Allergy shots are not started on patients who are pregnant but can be continued on patients who become pregnant while receiving them. In  some patients with other medical conditions or who take certain common medications, allergy shots may be of risk. It is important to mention other medications you talk to your allergist.   What are the two types of build-ups offered:   RUSH or Rapid Desensitization -- one day of injections lasting from 8:30-4:30pm, injections every 1 hour.  Approximately half of the build-up process is completed in that one day.  The following week, normal build-up is resumed, and this entails ~16 visits either weekly or twice weekly, until reaching your "maintenance dose" which is continued weekly until eventually getting spaced out to every month for a duration of 3 to 5 years. The regular build-up appointments are nurse visits where the injections are administered, followed by required monitoring for 30 minutes.    Traditional build-up -- weekly visits for 6 -12 months until reaching "maintenance dose", then continue weekly until eventually spacing out to every 4 weeks as above. At these appointments, the injections are administered, followed by required monitoring for 30 minutes.     Either way is acceptable, and both are equally effective. With the rush protocol, the advantage is that less time is spent here for injections overall AND you would also reach maintenance dosing faster (which is when the clinical benefit starts to become more apparent). Not everyone is a candidate for rapid desensitization.   IF we proceed with the RUSH protocol, there are premedications which must be taken the day before and the day after the rush only (this includes antihistamines, steroids, and Singulair).  After the rush day, no prednisone or Singulair is required, and we just recommend antihistamines taken on your injection day.  What Is An Estimate of the Costs?  If you are interested in starting allergy injections, please check with your insurance company about your coverage for both allergy vial sets and allergy injections.   Please do so prior to making the appointment to start injections.  The following are CPT codes to give to your insurance company. These are the amounts we BILL to the insurance company, but the amount YOU WILL PAY and WE RECEIVE IS SUBSTANTIALLY LESS and depends on the contracts we have with different insurance companies.   Amount Billed to Insurance One allergy vial set  CPT 95165   $ 1200     Two allergy vial set  CPT 95165   $ 2400     Three allergy vial set  CPT 95165   $ 3600     One injection   CPT 95115   $ 35  Two injections   CPT 95117   $ 40 RUSH (Rapid Desensitization) CPT 95180 x 8 hours $500/hour  Regarding the allergy injections, your co-pay may or may not apply with each injection, so please confirm this with your insurance company. When you start allergy injections, 1 or 2 sets of vials are made based on your allergies.  Not all patients can be on one set  of vials. A set of vials lasts 6 months to a year depending on how quickly you can proceed with your build-up of your allergy injections. Vials are personalized for each patient depending on their specific allergens.  How often are allergy injection given during the build-up period?   Injections are given at least weekly during the build-up period until your maintenance dose is achieved. Per the doctor's discretion, you may have the option of getting allergy injections two times per week during the build-up period. However, there must be at least 48 hours between injections. The build-up period is usually completed within 6-12 months depending on your ability to schedule injections and for adjustments for reactions. When maintenance dose is reached, your injection schedule is gradually changed to every two weeks and later to every three weeks. Injections will then continue every 4 weeks. Usually, injections are continued for a total of 3-5 years.   When Will I Feel Better?  Some may experience decreased allergy symptoms during the  build-up phase. For others, it may take as long as 12 months on the maintenance dose. If there is no improvement after a year of maintenance, your allergist will discuss other treatment options with you.  If you aren't responding to allergy shots, it may be because there is not enough dose of the allergen in your vaccine or there are missing allergens that were not identified during your allergy testing. Other reasons could be that there are high levels of the allergen in your environment or major exposure to non-allergic triggers like tobacco smoke.  What Is the Length of Treatment?  Once the maintenance dose is reached, allergy shots are generally continued for three to five years. The decision to stop should be discussed with your allergist at that time. Some people may experience a permanent reduction of allergy symptoms. Others may relapse and a longer course of allergy shots can be considered.  What Are the Possible Reactions?  The two types of adverse reactions that can occur with allergy shots are local and systemic. Common local reactions include very mild redness and swelling at the injection site, which can happen immediately or several hours after. Report a delayed reaction from your last injection. These include arm swelling or runny nose, watery eyes or cough that occurs within 12-24 hours after injection. A systemic reaction, which is less common, affects the entire body or a particular body system. They are usually mild and typically respond quickly to medications. Signs include increased allergy symptoms such as sneezing, a stuffy nose or hives.   Rarely, a serious systemic reaction called anaphylaxis can develop. Symptoms include swelling in the throat, wheezing, a feeling of tightness in the chest, nausea or dizziness. Most serious systemic reactions develop within 30 minutes of allergy shots. This is why it is strongly recommended you wait in your doctor's office for 30 minutes after  your injections. Your allergist is trained to watch for reactions, and his or her staff is trained and equipped with the proper medications to identify and treat them.   Report to the nurse immediately if you experience any of the following symptoms: swelling, itching or redness of the skin, hives, watery eyes/nose, breathing difficulty, excessive sneezing, coughing, stomach pain, diarrhea, or light headedness. These symptoms may occur within 15-20 minutes after injection and may require medication.   Who Should Administer Allergy Shots?  The preferred location for receiving shots is your prescribing allergist's office. Injections can sometimes be given at another facility where the  physician and staff are trained to recognize and treat reactions, and have received instructions by your prescribing allergist.  What if I am late for an injection?   Injection dose will be adjusted depending upon how many days or weeks you are late for your injection.   What if I am sick?   Please report any illness to the nurse before receiving injections. She may adjust your dose or postpone injections depending on your symptoms. If you have fever, flu, sinus infection or chest congestion it is best to postpone allergy injections until you are better. Never get an allergy injection if your asthma is causing you problems. If your symptoms persist, seek out medical care to get your health problem under control.  What If I am or Become Pregnant:  Women that become pregnant should schedule an appointment with [facility] before receiving any further allergy injections.

## 2023-11-23 NOTE — Progress Notes (Unsigned)
   FOLLOW UP  Date of Service/Encounter:  11/23/23   Assessment:   No diagnosis found.  Plan/Recommendations:   There are no Patient Instructions on file for this visit.   Subjective:   Renee Thomas is a 53 y.o. female presenting today for follow up of No chief complaint on file.   Renee Thomas has a history of the following: Patient Active Problem List   Diagnosis Date Noted   Neck pain 02/02/2022   GAD (generalized anxiety disorder) 12/16/2021   Hypertension 09/10/2020   GERD without esophagitis 05/02/2019   BMI 45.0-49.9, adult (HCC) 05/02/2019   Low serum vitamin B12 11/17/2016    History obtained from: chart review and {Persons; PED relatives w/patient:19415::"patient"}.  Discussed the use of AI scribe software for clinical note transcription with the patient and/or guardian, who gave verbal consent to proceed.  Renee Thomas is a 53 y.o. female presenting for skin testing. She was last seen on ***. We could not do testing because her insurance company does not cover testing on the same day as a New Patient visit. She has been off of all antihistamines 3 days in anticipation of the testing.   Otherwise, there have been no changes to her past medical history, surgical history, family history, or social history.    Review of systems otherwise negative other than that mentioned in the HPI.    Objective:   Last menstrual period 12/27/2022. There is no height or weight on file to calculate BMI.    Physical exam deferred since this was a skin testing appointment only.   Diagnostic studies: {Blank single:19197::"none","deferred due to recent antihistamine use","labs sent instead"," "}  Spirometry: {Blank single:19197::"results normal (FEV1: ***%, FVC: ***%, FEV1/FVC: ***%)","results abnormal (FEV1: ***%, FVC: ***%, FEV1/FVC: ***%)"}.    {Blank single:19197::"Spirometry consistent with mild obstructive disease","Spirometry consistent with moderate obstructive  disease","Spirometry consistent with severe obstructive disease","Spirometry consistent with possible restrictive disease","Spirometry consistent with mixed obstructive and restrictive disease","Spirometry uninterpretable due to technique","Spirometry consistent with normal pattern"}. {Blank single:19197::"Albuterol/Atrovent nebulizer","Xopenex/Atrovent nebulizer","Albuterol nebulizer","Albuterol four puffs via MDI","Xopenex four puffs via MDI"} treatment given in clinic with {Blank single:19197::"significant improvement in FEV1 per ATS criteria","significant improvement in FVC per ATS criteria","significant improvement in FEV1 and FVC per ATS criteria","improvement in FEV1, but not significant per ATS criteria","improvement in FVC, but not significant per ATS criteria","improvement in FEV1 and FVC, but not significant per ATS criteria","no improvement"}.  Allergy Studies: {Blank single:19197::"none","labs sent instead"," "}    {Blank single:19197::"Allergy testing results were read and interpreted by myself, documented by clinical staff."," "}      Malachi Bonds, MD  [facility]

## 2023-11-26 ENCOUNTER — Encounter: Payer: Self-pay | Admitting: Allergy & Immunology

## 2023-11-26 LAB — IGE NUT PROF. W/COMPONENT RFLX

## 2023-11-26 NOTE — Addendum Note (Signed)
Addended by: Robet Leu A on: 11/26/2023 03:57 PM   Modules accepted: Orders

## 2023-11-29 LAB — IGE NUT PROF. W/COMPONENT RFLX
F017-IgE Hazelnut (Filbert): 0.32 kU/L — AB
F018-IgE Brazil Nut: 0.11 kU/L — AB
F020-IgE Almond: 0.46 kU/L — AB
F202-IgE Cashew Nut: 1.67 kU/L — AB
F203-IgE Pistachio Nut: 2.53 kU/L — AB
Macadamia Nut, IgE: 0.11 kU/L — AB

## 2023-11-29 LAB — ALLERGEN COMPONENT COMMENTS

## 2023-11-29 LAB — PANEL 604726
Cor A 1 IgE: 0.54 kU/L — AB
Cor A 14 IgE: <0.1 kU/L
Cor A 8 IgE: <0.1 kU/L
Cor A 9 IgE: 0.27 kU/L — AB

## 2023-11-29 LAB — PANEL 604350: Ber E 1 IgE: <0.1 kU/L

## 2023-11-29 LAB — PANEL 604239: ANA O 3 IgE: 2.32 kU/L — AB

## 2023-12-10 ENCOUNTER — Encounter: Payer: Self-pay | Admitting: Allergy & Immunology

## 2023-12-24 ENCOUNTER — Ambulatory Visit (INDEPENDENT_AMBULATORY_CARE_PROVIDER_SITE_OTHER): Payer: No Typology Code available for payment source

## 2023-12-24 DIAGNOSIS — E538 Deficiency of other specified B group vitamins: Secondary | ICD-10-CM | POA: Diagnosis not present

## 2023-12-24 NOTE — Progress Notes (Signed)
 Patient is in office today for a nurse visit for B12 Injection. Patient Injection was given in the  Right deltoid. Patient tolerated injection well.

## 2023-12-27 ENCOUNTER — Encounter: Payer: Self-pay | Admitting: Neurology

## 2023-12-31 ENCOUNTER — Telehealth: Payer: Self-pay

## 2023-12-31 NOTE — Telephone Encounter (Signed)
-----   Message from Renee Thomas sent at 12/20/2023  1:18 PM EST ----- Can we change her April 23rd visit to a mixed tree nut butter challenge?

## 2023-12-31 NOTE — Telephone Encounter (Signed)
 Patients wants to hold off on doing a peanut challenge for now. She may do it in the future.

## 2023-12-31 NOTE — Telephone Encounter (Signed)
 I called and left a voicemail to discuss with the patient.

## 2023-12-31 NOTE — Telephone Encounter (Signed)
 Spoke with patient. She is ok on supplies right now. We can send in a new order for a year after she is seen next month at her yearly f/u. Pt was appreciative and agreed to plan.

## 2023-12-31 NOTE — Progress Notes (Signed)
 I called and left the patient a voicemail to discuss the change with the patient.

## 2024-01-01 NOTE — Telephone Encounter (Signed)
 Ok sounds good.

## 2024-01-08 ENCOUNTER — Encounter: Payer: Medicare HMO | Admitting: Nurse Practitioner

## 2024-01-14 ENCOUNTER — Telehealth: Payer: Self-pay | Admitting: Nurse Practitioner

## 2024-01-14 NOTE — Telephone Encounter (Signed)
 Returned call questions answered  Copied from CRM [id number]. Topic: General - Other >> Jan 14, 2024  8:52 AM Everette C wrote: Reason for CRM: The patient has requested contact with a member of administrative staff to further discuss concerns related to their active coverage

## 2024-01-15 ENCOUNTER — Encounter: Payer: Self-pay | Admitting: Nurse Practitioner

## 2024-01-15 ENCOUNTER — Ambulatory Visit (INDEPENDENT_AMBULATORY_CARE_PROVIDER_SITE_OTHER): Payer: Medicare HMO | Admitting: Nurse Practitioner

## 2024-01-15 VITALS — BP 146/88 | HR 55 | Temp 97.2°F | Ht 65.0 in | Wt 295.0 lb

## 2024-01-15 DIAGNOSIS — Z0000 Encounter for general adult medical examination without abnormal findings: Secondary | ICD-10-CM | POA: Diagnosis not present

## 2024-01-15 NOTE — Patient Instructions (Signed)
What are Advance Directives? ?A living will allows you to document your wishes concerning medical treatments at the end of life.  ? ?Before your living will can guide medical decision-making two physicians must certify: ?You are unable to make medical decisions,  ?You are in the medical condition specified in the state's living will law (such as "terminal illness" or "permanent unconsciousness"),  ?Other requirements also may apply, depending upon the state. ?A medical power of attorney (or healthcare proxy) allows you to appoint a person you trust as your healthcare agent (or surrogate decision maker), who is authorized to make medical decisions on your behalf.  ? ?Before a medical power of attorney goes into effect a person?s physician must conclude that they are unable to make their own medical decisions. In addition: ?If a person regains the ability to make decisions, the agent cannot continue to act on the person's behalf.  ?Many states have additional requirements that apply only to decisions about life-sustaining medical treatments.  ?For example, before your agent can refuse a life-sustaining treatment on your behalf, a second physician may have to confirm your doctor's assessment that you are incapable of making treatment decisions. ?What Else Do I Need to Know?  ?Advance directives are legally valid throughout the [place]. While you do not need a lawyer to fill out an advance directive, your advance directive becomes legally valid as soon as you sign them in front of the required witnesses. The laws governing advance directives vary from state to state, so it is important to complete and sign advance directives that comply with your state's law. Also, advance directives can have different titles in different states.  ?Emergency medical technicians cannot honor living wills or medical powers of attorney. Once emergency personnel have been called, they must do what is necessary to stabilize a person  for transfer to a hospital, both from accident sites and from a home or other facility. After a physician fully evaluates the person's condition and determines the underlying conditions, advance directives can be implemented.  ?One state?s advance directive does not always work in another state. Some states do honor advance directives from another state; others will honor out-of-state advance directives as long as they are similar to the state's own law; and some states do not have an answer to this question. The best solution is if you spend a significant amount of time in more than one state, you should complete the advance directives for all the states you spend a significant amount of time in.  ?Advance directives do not expire. An advance directive remains in effect until you change it. If you complete a new advance directive, it invalidates the previous one.  ?You should review your advance directives periodically to ensure that they still reflect your wishes. If you want to change anything in an advance directive once you have completed it, you should complete a whole new document. ?[place] ? ? ? ?[facility], www.nhpco.org ? ?

## 2024-01-15 NOTE — Progress Notes (Unsigned)
 PATIENT: Renee Thomas DOB: August 08, 1971  REASON FOR VISIT: follow up HISTORY FROM: patient  Virtual Visit via MyChart video  I connected with Renee Thomas on 01/17/24 at  9:45 AM EDT via MyChart video and verified that I am speaking with the correct person using two identifiers.   I discussed the limitations, risks, security and privacy concerns of performing an evaluation and management service by Mychart video and the availability of in person appointments. I also discussed with the patient that there may be a patient responsible charge related to this service. The patient expressed understanding and agreed to proceed.   History of Present Illness:  01/17/24 ALL: [name] is a 53 y.o. female here today for follow up for OSA on CPAP. She continues to do well. She is using therapy nightly for aout 8 hours, on average. She denies concerns with machine. She is using a medium under the nose FFM but wants to try a small.     History (copied from Dr Teofilo Pod previous note)  Renee Thomas is a 53 year old right-handed woman with an underlying medical history of anemia, reflux disease, vitamin D deficiency, mitral valve prolapse, PVCs, vitamin B12 deficiency, and morbid obesity with a BMI of over 45, who pending presents for follow-up consultation of her OSA, on autoPAP therapy. The patient is accompanied by her sister today. I last saw her on 01/04/2022, at which time she reported overall doing better.  She was compliant with her AutoPap and benefited from it.  She had started a new anxiety medication but could not tolerate it.   Today, 01/11/2023: I reviewed her AutoPap compliance data from 12/10/2022 through 01/08/2023, which is a total of 30 days, during which time she used her machine every night with percent use days greater than 4 hours at 100%, indicating superb compliance, average usage of 8 hours and 10 minutes, residual AHI at goal at 0.1/h, leak on the low side with the 95th percentile at 0.9  L/min, 95th percentile of pressure at 7.4 cm with a range of 6 to 13 cm.  She reports overall doing well.  Her heart rate and her blood pressure are a little low today but she does not really have any symptoms, did not actually have breakfast yet.  She is compliant with her medications and doing well with her supplies for AutoPap and continues to benefit from it.  She is very motivated to continue with treatment.  She is working on weight loss and compared to last year has lost over 10 pounds.  She had COVID about a month ago.  Her sister and her nephew also had COVID.  She does have some dry mouth and her right side of the nose gets congested.  She uses an under the nose fullface mask.     Observations/Objective:  Generalized: Well developed, in no acute distress  Mentation: Alert oriented to time, place, history taking. Follows all commands speech and language fluent   Assessment and Plan:  53 y.o. year old female  has a past medical history of Anemia, Arthritis, Balance disorder, Eczema, GERD (gastroesophageal reflux disease), Hypertension, MVP (mitral valve prolapse), OSA on CPAP (10/2021), PMB (postmenopausal bleeding), Pre-diabetes, PVC's (premature ventricular contractions), Vitamin B 12 deficiency, Vitamin D deficiency (2022), and Wears glasses. here with    ICD-10-CM   1. OSA on CPAP  G47.33 For home use only DME continuous positive airway pressure (CPAP)      Scottie continues to do well. Compliance report  shows excellent compliance. She was encouraged to continue therapy nightly for at least 4 hours. Supply orders updated. She may discuss change of mask size with DME. She will return to see me in 1 year.   Orders Placed This Encounter  Procedures   For home use only DME continuous positive airway pressure (CPAP)    Heated Humidity with all supplies as needed    Length of Need:   Lifetime    Patient has OSA or probable OSA:   Yes    Is the patient currently using CPAP in the home:    Yes    Settings:   Other see comments    CPAP supplies needed:   Mask, headgear, cushions, filters, heated tubing and water chamber    No orders of the defined types were placed in this encounter.    Follow Up Instructions:  I discussed the assessment and treatment plan with the patient. The patient was provided an opportunity to ask questions and all were answered. The patient agreed with the plan and demonstrated an understanding of the instructions.   The patient was advised to call back or seek an in-person evaluation if the symptoms worsen or if the condition fails to improve as anticipated.  I provided 15 minutes of face-to-face and non face-to-face time during this MyChart video encounter. Patient located at their place of residence. Provider is in the office.    Shawnie Dapper, NP

## 2024-01-15 NOTE — Patient Instructions (Signed)

## 2024-01-15 NOTE — Progress Notes (Signed)
 Subjective:    Renee Thomas is a 53 y.o. female who presents for a Welcome to Medicare exam.   Cardiac Risk Factors include: dyslipidemia;hypertension;obesity (BMI >30kg/m2)      Objective:    Today's Vitals   01/15/24 1035 01/15/24 1037  BP: (!) 146/88   Pulse: (!) 55   Temp: (!) 97.2 F (36.2 C)   TempSrc: Temporal   SpO2: 100%   Weight: 295 lb (133.8 kg)   Height: 5\' 5"  (1.651 m)   PainSc:  5   Body mass index is 49.09 kg/m.  Medications Outpatient Encounter Medications as of 01/15/2024  Medication Sig   acetaminophen (TYLENOL) 650 MG CR tablet Take 650 mg by mouth 2 (two) times daily.   amLODipine (NORVASC) 2.5 MG tablet Take 1 tablet (2.5 mg total) by mouth daily.   azelastine (ASTELIN) 0.1 % nasal spray Place 2 sprays into both nostrils 2 (two) times daily. Use in each nostril as directed   Carboxymethylcellul-Glycerin (CLEAR EYES FOR DRY EYES OP) Place 1-2 drops into both eyes daily as needed (dry eyes).   Cholecalciferol (VITAMIN D3) 50 MCG (2000 UT) TABS Take 1 tablet by mouth daily after lunch.   Cyanocobalamin (B-12 COMPLIANCE INJECTION IJ) Inject as directed every 30 (thirty) days.   escitalopram (LEXAPRO) 10 MG tablet Take 1 tablet (10 mg total) by mouth daily.   fexofenadine (ALLEGRA) 180 MG tablet TAKE 1 TABLET BY MOUTH EVERY DAY   fluticasone (FLONASE) 50 MCG/ACT nasal spray Place 2 sprays into both nostrils daily. SPRAY 2 SPRAYS INTO EACH NOSTRIL EVERY DAY   hydrochlorothiazide (HYDRODIURIL) 25 MG tablet Take 1 tablet (25 mg total) by mouth daily.   Krill Oil 350 MG CAPS Take 1 capsule by mouth daily.   metoprolol tartrate (LOPRESSOR) 25 MG tablet TAKE 1/2 TABLET (12.5 MG TOTAL) BY MOUTH 2 (TWO) TIMES DAILY AS NEEDED (PALPITATIONS).   nystatin (MYCOSTATIN/NYSTOP) powder Apply 1 Application topically 3 (three) times daily. Apply to affected area for up to 7 days (Patient taking differently: Apply 1 Application topically 3 (three) times daily as needed. Apply  to affected area for up to 7 days)   omeprazole (PRILOSEC) 20 MG capsule Take 1 capsule (20 mg total) by mouth daily.   Probiotic Product (PROBIOTIC PO) Take 1 capsule by mouth daily.   rosuvastatin (CRESTOR) 10 MG tablet Take 1 tablet (10 mg total) by mouth daily.   triamcinolone ointment (KENALOG) 0.5 % Apply 1 Application topically 2 (two) times daily.   Facility-Administered Encounter Medications as of 01/15/2024  Medication   cyanocobalamin (VITAMIN B12) injection 1,000 mcg     History: Past Medical History:  Diagnosis Date   Anemia    Arthritis    neck, right foot, left shoulder   Balance disorder    06-27-2022  per pt at times "feels off balance without dizziness" , stated has had neurology/ cardiology/ and ENT work-up without etiology   Eczema    GERD (gastroesophageal reflux disease)    Hypertension    MVP (mitral valve prolapse)    no evidence per echo in epic 10-21-2020, has trivial mitral valve regurg   OSA on CPAP 10/2021   followed by neurologist--- dr Frances Furbish;  sleep study in epic 10-26-2021 severe osa ( 06-27-2022 per pt uses apap nightly)   PMB (postmenopausal bleeding)    w/ hx endometrial polyp   Pre-diabetes    PVC's (premature ventricular contractions)    followed by cardiologist--- dr branch;  last event monitor 11-11-2020  frequent PVCs (6%), NSR w/ rare SVT;   low risk nuclear study 10-21-2020 w/ no ischemia   Vitamin B 12 deficiency    Vitamin D deficiency 2022   Wears glasses    Past Surgical History:  Procedure Laterality Date   COLONOSCOPY WITH PROPOFOL N/A 08/24/2023   Procedure: COLONOSCOPY WITH PROPOFOL;  Surgeon: Jeani Hawking, MD;  Location: [facility];  Service: Gastroenterology;  Laterality: N/A;   CYSTOSCOPY N/A 09/07/2023   Procedure: CYSTOSCOPY;  Surgeon: Earley Favor, MD;  Location: [facility];  Service: Gynecology;  Laterality: N/A;   DILATATION & CURETTAGE/HYSTEROSCOPY WITH MYOSURE N/A 07/11/2022   Procedure:  DILATATION & CURETTAGE/HYSTEROSCOPY WITH MYOSURE RESECTION OF ENDOMETRIAL POLYPS;  Surgeon: Patton Salles, MD;  Location: [facility] [place];  Service: Gynecology;  Laterality: N/A;   POLYPECTOMY  08/24/2023   Procedure: POLYPECTOMY;  Surgeon: Jeani Hawking, MD;  Location: [facility];  Service: Gastroenterology;;   ROBOTIC ASSISTED TOTAL HYSTERECTOMY WITH BILATERAL SALPINGO OOPHERECTOMY Bilateral 09/07/2023   Procedure: XI ROBOTIC ASSISTED TOTAL HYSTERECTOMY WITH BILATERAL SALPINGO OOPHORECTOMY;  Surgeon: Earley Favor, MD;  Location: [facility];  Service: Gynecology;  Laterality: Bilateral;    Family History  Problem Relation Age of Onset   Hypertension Mother    Diabetes Mother    Other Mother        renal disease   Hypertension Father    Heart attack Father    Allergic rhinitis Sister    Hypertension Sister    Allergic rhinitis Sister    Allergic rhinitis Brother    Diabetes Brother    Allergic rhinitis Brother    Diabetes Brother    Allergic rhinitis Brother    Allergic rhinitis Brother    Sleep apnea Neg Hx    Social History   Occupational History   Occupation: Disabled  Tobacco Use   Smoking status: Never    Passive exposure: Past   Smokeless tobacco: Never  Vaping Use   Vaping status: Never Used  Substance and Sexual Activity   Alcohol use: No    Alcohol/week: 0.0 standard drinks of alcohol   Drug use: Never   Sexual activity: Not Currently    Partners: Male    Birth control/protection: Abstinence    Tobacco Counseling Counseling given: Not Answered   Immunizations and Health Maintenance Immunization History  Administered Date(s) Administered   Influenza Inj Mdck Quad Pf 08/20/2019, 09/01/2020, 09/23/2021, 08/16/2022   Influenza Split 08/07/2019   Influenza, Mdck, Trivalent,PF 6+ MOS(egg free) 07/30/2023   Influenza, Quadrivalent, Recombinant, Inj, Pf 09/30/2018   Moderna SARS-COV2 Booster Vaccination 11/01/2020    Moderna Sars-Covid-2 Vaccination 02/04/2020, 03/26/2020   Tdap 11/06/2001, 06/05/2013   Zoster Recombinant(Shingrix) 03/12/2023, 09/27/2023   Health Maintenance Due  Topic Date Due   Medicare Annual Wellness (AWV)  Never done    Activities of Daily Living    01/15/2024   10:39 AM 01/14/2024    3:45 PM  In your present state of health, do you have any difficulty performing the following activities:  Hearing? 0 0  Vision? 0 0  Difficulty concentrating or making decisions? 0 0  Walking or climbing stairs? 0 0  Dressing or bathing? 0 0  Doing errands, shopping? 1 1  Preparing Food and eating ? N N  Using the Toilet? N N  In the past six months, have you accidently leaked urine? N N  Do you have problems with loss of bowel control? N N  Managing your  Medications? N N  Managing your Finances? N N  Housekeeping or managing your Housekeeping? N     Physical Exam   Physical Exam (optional), or other factors deemed appropriate based on the beneficiary's medical and social history and current clinical standards.   Advanced Directives: Does Patient Have a Medical Advance Directive?: No Would patient like information on creating a medical advance directive?: No - Patient declined  EKG:  09/04/23-normal EKG, normal sinus rhythm, unchanged from previous tracings      Assessment:    This is a routine wellness examination for this patient . Welcome to medicare  Vision/Hearing screen No results found.   Goals      DIET - EAT MORE FRUITS AND VEGETABLES     Exercise 150 min/wk Moderate Activity        Depression Screen    01/15/2024   10:43 AM 09/27/2023   10:56 AM 03/12/2023    8:25 AM 09/11/2022    8:34 AM  PHQ 2/9 Scores  PHQ - 2 Score 0 2 0 2  PHQ- 9 Score  3 2 4      Fall Risk    01/15/2024   10:44 AM  Fall Risk   Falls in the past year? 0    Cognitive Function:        01/15/2024   10:44 AM  6CIT Screen  What Year? 0 points  What month? 0 points  What  time? 0 points  Count back from 20 0 points  Months in reverse 0 points  Repeat phrase 0 points  Total Score 0 points    Patient Care Team: Bennie Pierini, FNP as PCP - General (Family Medicine) Wyline Mood, Dorothe Pea, MD as PCP - Cardiology (Cardiology)     Plan:   Welcome to Medicare  I have personally reviewed and noted the following in the patient's chart:   Medical and social history Use of alcohol, tobacco or illicit drugs  Current medications and supplements Functional ability and status Nutritional status Physical activity Advanced directives List of other physicians Hospitalizations, surgeries, and ER visits in previous 12 months Vitals Screenings to include cognitive, depression, and falls Referrals and appointments  In addition, I have reviewed and discussed with patient certain preventive protocols, quality metrics, and best practice recommendations. A written personalized care plan for preventive services as well as general preventive health recommendations were provided to patient.     [name] Daphine Deutscher, [place] 01/15/2024

## 2024-01-16 NOTE — Progress Notes (Unsigned)
 Marland Kitchen

## 2024-01-17 ENCOUNTER — Telehealth: Payer: 59 | Admitting: Family Medicine

## 2024-01-17 ENCOUNTER — Encounter: Payer: Self-pay | Admitting: Family Medicine

## 2024-01-17 ENCOUNTER — Other Ambulatory Visit: Payer: Self-pay | Admitting: Nurse Practitioner

## 2024-01-17 DIAGNOSIS — G4733 Obstructive sleep apnea (adult) (pediatric): Secondary | ICD-10-CM

## 2024-01-21 ENCOUNTER — Ambulatory Visit (INDEPENDENT_AMBULATORY_CARE_PROVIDER_SITE_OTHER): Payer: No Typology Code available for payment source

## 2024-01-21 DIAGNOSIS — E538 Deficiency of other specified B group vitamins: Secondary | ICD-10-CM | POA: Diagnosis not present

## 2024-01-21 NOTE — Progress Notes (Signed)
 [name] Daphine Deutscher, FNP

## 2024-01-21 NOTE — Progress Notes (Signed)
 Pt given cyanocobalamin injection left deltoid. Pt tolerated well.

## 2024-01-24 DIAGNOSIS — G4733 Obstructive sleep apnea (adult) (pediatric): Secondary | ICD-10-CM | POA: Diagnosis not present

## 2024-01-29 DIAGNOSIS — Z008 Encounter for other general examination: Secondary | ICD-10-CM | POA: Diagnosis not present

## 2024-01-29 DIAGNOSIS — E785 Hyperlipidemia, unspecified: Secondary | ICD-10-CM | POA: Diagnosis not present

## 2024-01-29 DIAGNOSIS — R2681 Unsteadiness on feet: Secondary | ICD-10-CM | POA: Diagnosis not present

## 2024-01-29 DIAGNOSIS — I10 Essential (primary) hypertension: Secondary | ICD-10-CM | POA: Diagnosis not present

## 2024-01-29 DIAGNOSIS — F411 Generalized anxiety disorder: Secondary | ICD-10-CM | POA: Diagnosis not present

## 2024-01-29 DIAGNOSIS — Z6841 Body Mass Index (BMI) 40.0 and over, adult: Secondary | ICD-10-CM | POA: Diagnosis not present

## 2024-01-29 DIAGNOSIS — R7303 Prediabetes: Secondary | ICD-10-CM | POA: Diagnosis not present

## 2024-02-15 ENCOUNTER — Encounter: Payer: Self-pay | Admitting: *Deleted

## 2024-02-18 ENCOUNTER — Encounter: Payer: Self-pay | Admitting: Cardiology

## 2024-02-18 ENCOUNTER — Ambulatory Visit: Payer: Medicare HMO | Attending: Cardiology | Admitting: Cardiology

## 2024-02-18 VITALS — BP 124/76 | HR 62 | Ht 65.0 in | Wt 297.4 lb

## 2024-02-18 DIAGNOSIS — I493 Ventricular premature depolarization: Secondary | ICD-10-CM

## 2024-02-18 DIAGNOSIS — I10 Essential (primary) hypertension: Secondary | ICD-10-CM | POA: Diagnosis not present

## 2024-02-18 DIAGNOSIS — E782 Mixed hyperlipidemia: Secondary | ICD-10-CM

## 2024-02-18 NOTE — Progress Notes (Signed)
 Clinical Summary Renee Thomas is a 53 y.o.female seen today for follow up of the following medical problems.      1. Dizziness - long history of symptoms  can come on with reading or watching tv, turning her head certain ways. Bad episodes can last several hours.    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 Was standing at sink. Felt lightheaded/dizzy like may pass out. Sitting felt better. 2nd that day sitting watching tv, similar episode. Shorter in duration. No other associated symptoms - similar to her prior episodes, but this one did not have headache - had some improvement with vestibular rehab     - ongoing feeling of being off balance with walking - evaluated by neuro, ENT   2. PVCs - occasional palpitaitons, mainly with laying down to go to sleep. Fairly mild. Can occur with stress - no coffee, occasional tea, 1 soda a nigh MTN Dew, no EtoH - normal K, Mg, normal TSH - over the last month can have some postional back and chest pain. Heaviest activity is moving heavy boxes at work with some mild fatigue.  - previous monitor in 2003/2004.      10/2020 nuclear stress no ischemia 10/2020 echo :LVEF 55-60% Jan 2022 14 day monitor: 6.3% PVCs, rare short runs of SVT   - occasional palps, roughly 2-3 times a month. Just a few minutes - compliant with meds     3. Chest pressure/DOE - 10/2020 nuclear stress no ischemia 10/2020 echo :LVEF 55-60%   -upper left chest, aching like pain. 5/10 in severity. Typically at rest. Not positional. No other associated symptoms. Lasts about 1 hour.  - walks daily 15 min daily, no exertional symptoms.      4. HTN - has not taken her norvasc yet today      5. Hyperlipidemia - 03/2022 TC 131 TG 63 HDL 51 LDL 67 - 09/2023 TC  133 TG 67 HDL 46 LDL 73 - she is compliant with rosuvastatin Past Medical History:  Diagnosis Date   Anemia    Arthritis    neck, right foot, left shoulder   Balance disorder    06-27-2022  per pt at times "feels off balance without dizziness" , stated has had neurology/ cardiology/ and ENT work-up without etiology   Eczema    GERD (gastroesophageal reflux disease)    Hypertension    MVP (mitral valve prolapse)    no evidence per echo in epic 10-21-2020, has trivial mitral valve regurg   OSA on CPAP 10/2021   followed by neurologist--- dr Frances Furbish;  sleep study in epic 10-26-2021 severe osa ( 06-27-2022 per pt uses apap nightly)   PMB (postmenopausal bleeding)    w/ hx endometrial polyp   Pre-diabetes    PVC's (premature ventricular contractions)    followed by cardiologist--- dr Zarah Carbon;  last event monitor 11-11-2020  frequent PVCs (6%), NSR w/ rare SVT;   low risk nuclear study 10-21-2020 w/ no ischemia   Vitamin B 12 deficiency    Vitamin D deficiency 2022   Wears glasses      Allergies  Allergen Reactions   Egg-Derived Products Swelling    Swells mouth with itching   Fluarix [Influenza Virus Vaccine] Swelling  Contains egg protein.   Other Anaphylaxis    All Tree-Nuts   Peanut-Containing Drug Products Anaphylaxis   Penicillins Rash and Anaphylaxis    .Has patient had a PCN reaction causing immediate rash, facial/tongue/throat swelling, SOB or lightheadedness with hypotension: Yes Has patient had a PCN reaction causing severe rash involving mucus membranes or skin necrosis: No Has patient had a PCN reaction that required hospitalization: no Has patient had a PCN reaction occurring within the last 10 years: no If all of the above answers are "NO", then may proceed with Cephalosporin use.  Other reaction(s): Other (See Comments) .Has patient had a PCN reaction causing immediate rash, facial/tongue/throat swelling, SOB or lightheadedness with hypotension: Yes Has patient  had a PCN reaction causing severe rash involving mucus membranes or skin necrosis: No Has patient had a PCN reaction that required hospitalization: no Has patient had a PCN reaction occurring within the last 10 years: no If all of the above answers are "NO", then may proceed with Cephalosporin use.   Apple Itching    Mouth itching   Blueberry Flavoring Agent (Non-Screening) Itching   Kiwi Extract Itching    Per pt tongue and throat itching/ burning   Watermelon Flavoring Agent (Non-Screening) Itching    And Canteloupe   Amoxicillin Rash   Sulfa Antibiotics Rash    numbness     Current Outpatient Medications  Medication Sig Dispense Refill   acetaminophen (TYLENOL) 650 MG CR tablet Take 650 mg by mouth 2 (two) times daily.     amLODipine (NORVASC) 2.5 MG tablet Take 1 tablet (2.5 mg total) by mouth daily. 90 tablet 1   Carboxymethylcellul-Glycerin (CLEAR EYES FOR DRY EYES OP) Place 1-2 drops into both eyes daily as needed (dry eyes).     Cholecalciferol (VITAMIN D3) 50 MCG (2000 UT) TABS Take 1 tablet by mouth daily after lunch.     Cyanocobalamin (B-12 COMPLIANCE INJECTION IJ) Inject as directed every 30 (thirty) days.     EPINEPHrine 0.3 mg/0.3 mL IJ SOAJ injection Inject 0.3 mg into the muscle as needed.     escitalopram (LEXAPRO) 10 MG tablet Take 1 tablet (10 mg total) by mouth daily. 90 tablet 1   fexofenadine (ALLEGRA) 180 MG tablet TAKE 1 TABLET BY MOUTH EVERY DAY 90 tablet 1   fluticasone (FLONASE) 50 MCG/ACT nasal spray Place 2 sprays into both nostrils daily. SPRAY 2 SPRAYS INTO EACH NOSTRIL EVERY DAY 16 g 5   hydrochlorothiazide (HYDRODIURIL) 25 MG tablet Take 1 tablet (25 mg total) by mouth daily. 90 tablet 1   ibuprofen (ADVIL) 800 MG tablet Take 800 mg by mouth every 8 (eight) hours as needed.     Krill Oil 350 MG CAPS Take 1 capsule by mouth daily.     metoprolol tartrate (LOPRESSOR) 25 MG tablet TAKE 1/2 TABLET (12.5 MG TOTAL) BY MOUTH 2 (TWO) TIMES DAILY AS NEEDED  (PALPITATIONS). 90 tablet 1   nystatin (MYCOSTATIN/NYSTOP) powder Apply 1 Application topically 3 (three) times daily. Apply to affected area for up to 7 days (Patient taking differently: Apply 1 Application topically 3 (three) times daily as needed. Apply to affected area for up to 7 days) 30 g 2   omeprazole (PRILOSEC) 20 MG capsule Take 1 capsule (20 mg total) by mouth daily. 90 capsule 1   Probiotic Product (PROBIOTIC PO) Take 1 capsule by mouth daily.     rosuvastatin (CRESTOR) 10 MG tablet Take 1 tablet (10 mg total) by mouth daily. 90 tablet 1  triamcinolone ointment (KENALOG) 0.5 % Apply 1 Application topically 2 (two) times daily. 30 g 0   azelastine (ASTELIN) 0.1 % nasal spray Place 2 sprays into both nostrils 2 (two) times daily. Use in each nostril as directed (Patient not taking: Reported on 02/18/2024) 30 mL 5   Current Facility-Administered Medications  Medication Dose Route Frequency Provider Last Rate Last Admin   cyanocobalamin (VITAMIN B12) injection 1,000 mcg  1,000 mcg Intramuscular Q30 days Bennie Pierini, FNP   1,000 mcg at 01/21/24 1004     Past Surgical History:  Procedure Laterality Date   COLONOSCOPY WITH PROPOFOL N/A 08/24/2023   Procedure: COLONOSCOPY WITH PROPOFOL;  Surgeon: Jeani Hawking, MD;  Location: [facility];  Service: Gastroenterology;  Laterality: N/A;   CYSTOSCOPY N/A 09/07/2023   Procedure: CYSTOSCOPY;  Surgeon: Earley Favor, MD;  Location: [facility];  Service: Gynecology;  Laterality: N/A;   DILATATION & CURETTAGE/HYSTEROSCOPY WITH MYOSURE N/A 07/11/2022   Procedure: DILATATION & CURETTAGE/HYSTEROSCOPY WITH MYOSURE RESECTION OF ENDOMETRIAL POLYPS;  Surgeon: Patton Salles, MD;  Location: [facility] [place];  Service: Gynecology;  Laterality: N/A;   POLYPECTOMY  08/24/2023   Procedure: POLYPECTOMY;  Surgeon: Jeani Hawking, MD;  Location: [facility];  Service: Gastroenterology;;   ROBOTIC ASSISTED  TOTAL HYSTERECTOMY WITH BILATERAL SALPINGO OOPHERECTOMY Bilateral 09/07/2023   Procedure: XI ROBOTIC ASSISTED TOTAL HYSTERECTOMY WITH BILATERAL SALPINGO OOPHORECTOMY;  Surgeon: Earley Favor, MD;  Location: [facility];  Service: Gynecology;  Laterality: Bilateral;     Allergies  Allergen Reactions   Egg-Derived Products Swelling    Swells mouth with itching   Fluarix [Influenza Virus Vaccine] Swelling    Contains egg protein.   Other Anaphylaxis    All Tree-Nuts   Peanut-Containing Drug Products Anaphylaxis   Penicillins Rash and Anaphylaxis    .Has patient had a PCN reaction causing immediate rash, facial/tongue/throat swelling, SOB or lightheadedness with hypotension: Yes Has patient had a PCN reaction causing severe rash involving mucus membranes or skin necrosis: No Has patient had a PCN reaction that required hospitalization: no Has patient had a PCN reaction occurring within the last 10 years: no If all of the above answers are "NO", then may proceed with Cephalosporin use.  Other reaction(s): Other (See Comments) .Has patient had a PCN reaction causing immediate rash, facial/tongue/throat swelling, SOB or lightheadedness with hypotension: Yes Has patient had a PCN reaction causing severe rash involving mucus membranes or skin necrosis: No Has patient had a PCN reaction that required hospitalization: no Has patient had a PCN reaction occurring within the last 10 years: no If all of the above answers are "NO", then may proceed with Cephalosporin use.   Apple Itching    Mouth itching   Blueberry Flavoring Agent (Non-Screening) Itching   Kiwi Extract Itching    Per pt tongue and throat itching/ burning   Watermelon Flavoring Agent (Non-Screening) Itching    And Canteloupe   Amoxicillin Rash   Sulfa Antibiotics Rash    numbness      Family History  Problem Relation Age of Onset   Hypertension Mother    Diabetes Mother    Other Mother        renal  disease   Hypertension Father    Heart attack Father    Allergic rhinitis Sister    Hypertension Sister    Allergic rhinitis Sister    Allergic rhinitis Brother    Diabetes Brother    Allergic rhinitis Brother  Diabetes Brother    Allergic rhinitis Brother    Allergic rhinitis Brother    Sleep apnea Neg Hx      Social History Ms. Dozier reports that she has never smoked. She has been exposed to tobacco smoke. She has never used smokeless tobacco. Ms. Vassell reports no history of alcohol use.    Physical Examination Today's Vitals   02/18/24 1430 02/18/24 1500  BP: (!) 140/78 124/76  Pulse: 62   SpO2: 94%   Weight: 297 lb 6.4 oz (134.9 kg)   Height: 5\' 5"  (1.651 m)    Body mass index is 49.49 kg/m.  Gen: resting comfortably, no acute distress HEENT: no scleral icterus, pupils equal round and reactive, no palptable cervical adenopathy,  CV: RRR, no m/rg, no jvd Resp: Clear to auscultation bilaterally GI: abdomen is soft, non-tender, non-distended, normal bowel sounds, no hepatosplenomegaly MSK: extremities are warm, no edema.  Skin: warm, no rash Neuro:  no focal deficits Psych: appropriate affect   Diagnostic Studies   08/2017 holter Sinus rhythm  Rates 53 to 114 bpm  Average HR 76 bpm  Frequent PVCs  (13% total)  12 couplets, 5 triplets       10/2020 echo 1. Left ventricular ejection fraction, by estimation, is 55 to 60%. The  left ventricle has normal function. The left ventricle has no regional  wall motion abnormalities. There is mild left ventricular hypertrophy.  Left ventricular diastolic parameters  are indeterminate.   2. Right ventricular systolic function is normal. The right ventricular  size is normal. Tricuspid regurgitation signal is inadequate for assessing  PA pressure.   3. The mitral valve is grossly normal. Trivial mitral valve  regurgitation.   4. The aortic valve is tricuspid. Aortic valve regurgitation is not  visualized.   5. The  inferior vena cava is normal in size with greater than 50%  respiratory variability, suggesting right atrial pressure of 3 mmHg.      Jan 2022 14 day monitor Predominant rhythm is normal sinus rhythm Rare supraventricular ectopy Frequent ventricular ectopy primarily as isolated PVCs (6.3%). Symptoms correlated with sinus rhythm, PVCs.   14 day monitor. Patient had a min HR of 50 bpm, max HR of 197 bpm, and avg HR of 73 bpm. Predominant underlying rhythm was Sinus Rhythm. 2 Supraventricular Tachycardia runs occurred, the run with the fastest interval lasting 5 beats with a max rate of 197 bpm, the longest lasting 6 beats with an avg rate of 103 bpm. Isolated SVEs were rare (<1.0%), and no SVE Couplets or SVE Triplets were present. Isolated VEs were frequent (6.3%, 92933), VE Couplets were rare (<1.0%, 3), and VE Triplets were rare (<1.0%, 1). Ventricular Bigeminy and Trigeminy were present.     10/2020 nuclear stress No diagnostic ST segment changes. Frequent PVCs were noted. Small, mild intensity, mid apical anteroseptal defect that is partially reversible and consistent with variable soft tissue attenuation. Summed stress score is only 1. This is a low risk study based on perfusion imaging. Nuclear stress EF: 19%. Calculation appears to be inaccurate, possibly affected by frequent PVCs. Suggest echocardiogram to correlate.  Assessment and Plan   1. PVCs - overall doing well, continue lopressor. Limited dosing due to resting low normal heart rate   2. HTN - bp at goal on manual recheck, continue current meds   3. Dizziness -history and prior testing not consistent with cardiac etiology -defer to neuro any additional workup   4.Hyperlipidemia - she is at goal, continue  current meds  F/u 1 year     Laurann Pollock, M.D.,

## 2024-02-18 NOTE — Patient Instructions (Signed)

## 2024-02-21 ENCOUNTER — Ambulatory Visit

## 2024-02-26 NOTE — Progress Notes (Signed)
 [address] [place] [address] Dept: [phone]  FOLLOW UP NOTE  Patient ID: Floria Hurst, female    DOB: 1971-08-17  Age: 53 y.o. MRN: [id number] Date of Office Visit: 02/27/2024  Assessment  Chief Complaint: Allergic Rhinitis  (No issues )  HPI Renee Thomas is a 53 year old female who presents to the clinic for follow-up visit.  She was last seen in this clinic on 11/23/2023 by Dr. 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    At today's visit, allergic rhinitis is reported as moderately well-controlled with postnasal drainage with frequent throat clearing and occasional sneezing as the main symptoms.  She continues Allegra  180 mg once a day, Flonase , and azelastine  as needed.  She is not currently using nasal saline rinse. Her last environmental allergy  skin testing was on 11/23/2023 and was positive to grass pollen, weed pollen, ragweed pollen, tree pollen, indoor mold, outdoor mold, dust mite, and cockroach.    Atopic dermatitis is reported as moderately well-controlled with occasional red and itchy areas mainly occurring on her hands.  She continues a moisturizing routine and occasionally needs to use triamcinolone  for relief of symptoms.    She continues to avoid peanuts and tree nuts with no accidental ingestion or EpiPen  use since her last visit to this clinic.  She has not introduced any tree nuts at home and is not interested in introducing peanuts or tree nuts at this time.  Her last food allergy  skin testing was on 11/23/2023 and was positive to cashew, pistachio, walnut, pecan, and [place] nut.  Her last food allergy  testing via lab on 11/23/2023 was positive to pistachio and cashew with equivocal results to hazelnut, [place] nut, macadamia nut, and almond.  Her current medications are listed in the chart.  Drug Allergies:  Allergies  Allergen Reactions   Egg-Derived Products Swelling    Swells mouth  with itching   Fluarix [Influenza Virus Vaccine] Swelling    Contains egg protein.   Other Anaphylaxis    All Tree-Nuts   Peanut-Containing Drug Products Anaphylaxis   Penicillins Rash and Anaphylaxis    .Has patient had a PCN reaction causing immediate rash, facial/tongue/throat swelling, SOB or lightheadedness with hypotension: Yes Has patient had a PCN reaction causing severe rash involving mucus membranes or skin necrosis: No Has patient had a PCN reaction that required hospitalization: no Has patient had a PCN reaction occurring within the last 10 years: no If all of the above answers are "NO", then may proceed with Cephalosporin use.  Other reaction(s): Other (See Comments) .Has patient had a PCN reaction causing immediate rash, facial/tongue/throat swelling, SOB or lightheadedness with hypotension: Yes Has patient had a PCN reaction causing severe rash involving mucus membranes or skin necrosis: No Has patient had a PCN reaction that required hospitalization: no Has patient had a PCN reaction occurring within the last 10 years: no If all of the above answers are "NO", then may proceed with Cephalosporin use.   Apple Itching    Mouth itching   Blueberry Flavoring Agent (Non-Screening) Itching   Kiwi Extract Itching    Per pt tongue and throat itching/ burning   Watermelon Flavoring Agent (Non-Screening) Itching    And Canteloupe   Amoxicillin Rash   Sulfa Antibiotics Rash    numbness    Physical Exam: BP 128/76 (BP Location: Right Arm, Patient Position: Sitting, Cuff Size: Normal)   Pulse 74   Temp 98 F (36.7  C) (Temporal)   Resp 18   Wt 293 lb (132.9 kg)   LMP 12/27/2022 (Approximate)   SpO2 95%   BMI 48.76 kg/m    Physical Exam Vitals reviewed.  Constitutional:      Appearance: Normal appearance.  HENT:     Head: Normocephalic and atraumatic.     Right Ear: Tympanic membrane normal.     Left Ear: Tympanic membrane normal.     Nose:     Comments: Bilateral  nares slightly erythematous with thin clear nasal drainage noted. Pharynx slightly erythematous with no exudate. Ears normal. Eyes normal. Eyes:     Conjunctiva/sclera: Conjunctivae normal.  Cardiovascular:     Rate and Rhythm: Normal rate and regular rhythm.     Heart sounds: Normal heart sounds. No murmur heard. Pulmonary:     Effort: Pulmonary effort is normal.     Breath sounds: Normal breath sounds.     Comments: Lungs clear to auscultation Musculoskeletal:        General: Normal range of motion.     Cervical back: Normal range of motion and neck supple.  Skin:    General: Skin is warm and dry.  Neurological:     Mental Status: She is alert and oriented to person, place, and time.  Psychiatric:        Mood and Affect: Mood normal.        Behavior: Behavior normal.        Thought Content: Thought content normal.        Judgment: Judgment normal.    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 Seasonal and perennial allergic rhinitis   3. Flexural atopic dermatitis     Meds ordered this encounter  Medications   tacrolimus  (PROTOPIC ) 0.1 % ointment    Sig: Apply topically 2 (two) times daily.    Dispense:  100 g    Refill:  0    Patient Instructions  Allergic rhinitis Continue allergen avoidance measures directed toward grass pollen, weed pollen, ragweed pollen, tree pollen, indoor mold, outdoor mold, dust mite, and cockroach as listed below Continue Allegra  180 mg once a day if needed for runny nose or itch.  You may take an additional dose of Allegra  180 mg once a day if needed for breakthrough symptoms. Continue Flonase  2 sprays in each nostril once a day if needed for stuffy nose.  In the right nostril, point the applicator out toward the right ear. In the left nostril, point the applicator out toward the left ear Continue azelastine  2 sprays in each nostril up to twice a day if needed for a runny nose Consider saline nasal rinses as  needed for nasal symptoms. Use this before any medicated nasal sprays for best result Consider allergen immunotherapy if your symptoms are not well-controlled with the treatment plan as listed above  Atopic dermatitis Continue with twice a day moisturizing routine Begin tacrolimus  to red and itchy areas up to twice a day if needed For red and itchy areas underneath your face, continue triamcinolone  up to twice a day if needed.  Do not use this medication longer than 2 weeks in a row  Food allergy  Continue to avoid peanuts and tree nuts.  In case of an allergic reaction, take Benadryl  50 mg every 4 hours, and if life-threatening symptoms occur, inject with EpiPen  0.3 mg. Consider oral food challenge to peanuts and selected tree nuts.  Remember to stop antihistamines for 3 days  before your challenge appointment  Call the clinic if this treatment plan is not working well for you.  Follow up in 6 months or sooner if needed.   Return in about 6 months (around 08/28/2024), or if symptoms worsen or fail to improve.    Thank you for the opportunity to care for this patient.  Please do not hesitate to contact me with questions.  Marinus Sic, FNP [facility] 

## 2024-02-26 NOTE — Patient Instructions (Incomplete)
 Allergic rhinitis Continue allergen avoidance measures directed toward grass pollen, weed pollen, ragweed pollen, tree pollen, indoor mold, outdoor mold, dust mite, and cockroach as listed below Continue Allegra  180 mg once a day if needed for runny nose or itch.  You may take an additional dose of Allegra  180 mg once a day if needed for breakthrough symptoms. Continue Flonase  2 sprays in each nostril once a day if needed for stuffy nose.  In the right nostril, point the applicator out toward the right ear. In the left nostril, point the applicator out toward the left ear Continue azelastine  2 sprays in each nostril up to twice a day if needed for a runny nose Consider saline nasal rinses as needed for nasal symptoms. Use this before any medicated nasal sprays for best result Consider allergen immunotherapy if your symptoms are not well-controlled with the treatment plan as listed above  Atopic dermatitis Continue with twice a day moisturizing routine For red and itchy areas underneath your face, continue triamcinolone  up to twice a day if needed.  Do not use this medication longer than 2 weeks in a row  Food allergy  Continue to avoid pistachio and cashew.  In case of an allergic reaction, take Benadryl  50 mg every 4 hours, and if life-threatening symptoms occur, inject with EpiPen  0.3 mg.  Call the clinic if this treatment plan is not working well for you.  Follow up in *** or sooner if needed.  Reducing Pollen Exposure The American Academy of Allergy , Asthma and Immunology suggests the following steps to reduce your exposure to pollen during allergy  seasons. Do not hang sheets or clothing out to dry; pollen may collect on these items. Do not mow lawns or spend time around freshly cut grass; mowing stirs up pollen. Keep windows closed at night.  Keep car windows closed while driving. Minimize morning activities outdoors, a time when pollen counts are usually at their highest. Stay indoors  as much as possible when pollen counts or humidity is high and on windy days when pollen tends to remain in the air longer. Use air conditioning when possible.  Many air conditioners have filters that trap the pollen spores. Use a HEPA room air filter to remove pollen form the indoor air you breathe.  Control of Mold Allergen Mold and fungi can grow on a variety of surfaces provided certain temperature and moisture conditions exist.  Outdoor molds grow on plants, decaying vegetation and soil.  The major outdoor mold, Alternaria and Cladosporium, are found in very high numbers during hot and dry conditions.  Generally, a late Summer - Fall peak is seen for common outdoor fungal spores.  Rain will temporarily lower outdoor mold spore count, but counts rise rapidly when the rainy period ends.  The most important indoor molds are Aspergillus and Penicillium.  Dark, humid and poorly ventilated basements are ideal sites for mold growth.  The next most common sites of mold growth are the bathroom and the kitchen.  Outdoor [employer] Use air conditioning and keep windows closed Avoid exposure to decaying vegetation. Avoid leaf raking. Avoid grain handling. Consider wearing a face mask if working in moldy areas.  Indoor Mold Control Maintain humidity below 50%. Clean washable surfaces with 5% bleach solution. Remove sources e.g. Contaminated carpets.   Control of Dust Mite Allergen Dust mites play a major role in allergic asthma and rhinitis. They occur in environments with high humidity wherever human skin is found. Dust mites absorb humidity from the atmosphere (ie,  they do not drink) and feed on organic matter (including shed human and animal skin). Dust mites are a microscopic type of insect that you cannot see with the naked eye. High levels of dust mites have been detected from mattresses, pillows, carpets, upholstered furniture, bed covers, clothes, soft toys and any woven material. The  principal allergen of the dust mite is found in its feces. A gram of dust may contain 1,000 mites and 250,000 fecal particles. Mite antigen is easily measured in the air during house cleaning activities. Dust mites do not bite and do not cause harm to humans, other than by triggering allergies/asthma.  Ways to decrease your exposure to dust mites in your home:  1. Encase mattresses, box springs and pillows with a mite-impermeable barrier or cover  2. Wash sheets, blankets and drapes weekly in hot water  (130 F) with detergent and dry them in a dryer on the hot setting.  3. Have the room cleaned frequently with a vacuum cleaner and a damp dust-mop. For carpeting or rugs, vacuuming with a vacuum cleaner equipped with a high-efficiency particulate air (HEPA) filter. The dust mite allergic individual should not be in a room which is being cleaned and should wait 1 hour after cleaning before going into the room.  4. Do not sleep on upholstered furniture (eg, couches).  5. If possible removing carpeting, upholstered furniture and drapery from the home is ideal. Horizontal blinds should be eliminated in the rooms where the person spends the most time (bedroom, study, television room). Washable vinyl, roller-type shades are optimal.  6. Remove all non-washable stuffed toys from the bedroom. Wash stuffed toys weekly like sheets and blankets above.  7. Reduce indoor humidity to less than 50%. Inexpensive humidity monitors can be purchased at most hardware stores. Do not use a humidifier as can make the problem worse and are not recommended.  Control of Cockroach Allergen Cockroach allergen has been identified as an important cause of acute attacks of asthma, especially in urban settings.  There are fifty-five species of cockroach that exist in the [place] , however only three, the [place], [place] and [place] species produce allergen that can affect patients with Asthma.  Allergens can be obtained  from fecal particles, egg casings and secretions from cockroaches.    Remove food sources. Reduce access to water . Seal access and entry points. Spray runways with 0.5-1% Diazinon or Chlorpyrifos Blow boric acid power under stoves and refrigerator. Place bait stations (hydramethylnon) at feeding sites.

## 2024-02-27 ENCOUNTER — Other Ambulatory Visit: Payer: Self-pay

## 2024-02-27 ENCOUNTER — Ambulatory Visit: Payer: No Typology Code available for payment source | Admitting: Family Medicine

## 2024-02-27 ENCOUNTER — Encounter: Payer: Self-pay | Admitting: Family Medicine

## 2024-02-27 ENCOUNTER — Telehealth: Payer: Self-pay

## 2024-02-27 VITALS — BP 128/76 | HR 74 | Temp 98.0°F | Resp 18 | Wt 293.0 lb

## 2024-02-27 DIAGNOSIS — J302 Other seasonal allergic rhinitis: Secondary | ICD-10-CM | POA: Insufficient documentation

## 2024-02-27 DIAGNOSIS — T7805XA Anaphylactic reaction due to tree nuts and seeds, initial encounter: Secondary | ICD-10-CM | POA: Insufficient documentation

## 2024-02-27 DIAGNOSIS — T7805XD Anaphylactic reaction due to tree nuts and seeds, subsequent encounter: Secondary | ICD-10-CM | POA: Diagnosis not present

## 2024-02-27 DIAGNOSIS — J3089 Other allergic rhinitis: Secondary | ICD-10-CM | POA: Diagnosis not present

## 2024-02-27 DIAGNOSIS — L2089 Other atopic dermatitis: Secondary | ICD-10-CM | POA: Diagnosis not present

## 2024-02-27 MED ORDER — TACROLIMUS 0.1 % EX OINT: TOPICAL_OINTMENT | Freq: Two times a day (BID) | CUTANEOUS | 0 refills | Status: DC

## 2024-02-27 NOTE — Telephone Encounter (Signed)
*  Asthma/Allergy   Pharmacy Patient Advocate Encounter   Received notification from CoverMyMeds that prior authorization for Tacrolimus  0.1% ointment  is required/requested.   Insurance verification completed.   The patient is insured through [facility] .   Per test claim: PA required; PA started via CoverMyMeds. KEY [id number] . Waiting for clinical questions to populate.

## 2024-02-27 NOTE — Telephone Encounter (Signed)
 Your request has been approved Effective Date: 11/29/2023 Authorization Expiration Date: 02/26/2025

## 2024-02-28 ENCOUNTER — Ambulatory Visit (INDEPENDENT_AMBULATORY_CARE_PROVIDER_SITE_OTHER)

## 2024-02-28 DIAGNOSIS — E538 Deficiency of other specified B group vitamins: Secondary | ICD-10-CM

## 2024-02-28 NOTE — Progress Notes (Signed)
 Patient is in office today for a nurse visit for B12 Injection. Patient Injection was given in the  Right deltoid. Patient tolerated injection well.

## 2024-02-29 ENCOUNTER — Ambulatory Visit

## 2024-03-04 ENCOUNTER — Ambulatory Visit

## 2024-03-12 ENCOUNTER — Other Ambulatory Visit: Payer: Self-pay | Admitting: Nurse Practitioner

## 2024-03-12 DIAGNOSIS — E7800 Pure hypercholesterolemia, unspecified: Secondary | ICD-10-CM

## 2024-03-12 DIAGNOSIS — K219 Gastro-esophageal reflux disease without esophagitis: Secondary | ICD-10-CM

## 2024-03-12 DIAGNOSIS — I10 Essential (primary) hypertension: Secondary | ICD-10-CM

## 2024-03-12 DIAGNOSIS — F411 Generalized anxiety disorder: Secondary | ICD-10-CM

## 2024-03-14 ENCOUNTER — Encounter (HOSPITAL_COMMUNITY): Payer: Self-pay

## 2024-03-21 ENCOUNTER — Encounter: Payer: Self-pay | Admitting: Nurse Practitioner

## 2024-03-21 ENCOUNTER — Ambulatory Visit (INDEPENDENT_AMBULATORY_CARE_PROVIDER_SITE_OTHER): Payer: Medicare HMO | Admitting: Nurse Practitioner

## 2024-03-21 VITALS — BP 116/69 | HR 56 | Temp 97.3°F | Ht 65.0 in | Wt 292.0 lb

## 2024-03-21 DIAGNOSIS — Z6841 Body Mass Index (BMI) 40.0 and over, adult: Secondary | ICD-10-CM

## 2024-03-21 DIAGNOSIS — K219 Gastro-esophageal reflux disease without esophagitis: Secondary | ICD-10-CM

## 2024-03-21 DIAGNOSIS — R7309 Other abnormal glucose: Secondary | ICD-10-CM | POA: Diagnosis not present

## 2024-03-21 DIAGNOSIS — E538 Deficiency of other specified B group vitamins: Secondary | ICD-10-CM | POA: Diagnosis not present

## 2024-03-21 DIAGNOSIS — F411 Generalized anxiety disorder: Secondary | ICD-10-CM | POA: Diagnosis not present

## 2024-03-21 DIAGNOSIS — I10 Essential (primary) hypertension: Secondary | ICD-10-CM

## 2024-03-21 DIAGNOSIS — E7800 Pure hypercholesterolemia, unspecified: Secondary | ICD-10-CM

## 2024-03-21 LAB — BAYER DCA HB A1C WAIVED: HB A1C (BAYER DCA - WAIVED): 5.8 % — ABNORMAL HIGH (ref 4.8–5.6)

## 2024-03-21 LAB — LIPID PANEL

## 2024-03-21 MED ORDER — METOPROLOL TARTRATE 25 MG PO TABS: ORAL_TABLET | ORAL | 1 refills | Status: DC

## 2024-03-21 MED ORDER — HYDROCHLOROTHIAZIDE 25 MG PO TABS: 25.0000 mg | ORAL_TABLET | Freq: Every day | ORAL | 1 refills | Status: DC

## 2024-03-21 MED ORDER — OMEPRAZOLE 20 MG PO CPDR
20.0000 mg | DELAYED_RELEASE_CAPSULE | Freq: Every day | ORAL | 1 refills | Status: DC
Start: 2024-03-21 — End: 2024-09-23

## 2024-03-21 MED ORDER — AMLODIPINE BESYLATE 2.5 MG PO TABS: 2.5000 mg | ORAL_TABLET | Freq: Every day | ORAL | 1 refills | Status: DC

## 2024-03-21 MED ORDER — ESCITALOPRAM OXALATE 10 MG PO TABS: 10.0000 mg | ORAL_TABLET | Freq: Every day | ORAL | 1 refills | Status: DC

## 2024-03-21 MED ORDER — ROSUVASTATIN CALCIUM 10 MG PO TABS: 10.0000 mg | ORAL_TABLET | Freq: Every day | ORAL | 1 refills | Status: DC

## 2024-03-21 NOTE — Progress Notes (Addendum)
 Subjective:    Patient ID: Renee Thomas, female    DOB: 18-Feb-1971, 53 y.o.   MRN: [id number]   Chief Complaint: medical management of chronic issues     HPI:  Renee Thomas is a 53 y.o. who identifies as a female who was assigned female at birth.   Social history: Lives with: sister Work history: not working   Comes in today for follow up of the following chronic medical issues:  1. Primary hypertension No c/o chest pain, sob or headache. Does  not check blood pressure at home. BP Readings from Last 3 Encounters:  02/27/24 128/76  02/18/24 124/76  01/15/24 (!) 146/88     2. GERD without esophagitis Is on omeprazole  and is doing well  3. GAD (generalized anxiety disorder) Is on lexapro  and is doing well    01/15/2024   10:46 AM 09/27/2023   10:57 AM 03/12/2023    8:25 AM 09/11/2022    8:34 AM  GAD 7 : Generalized Anxiety Score  Nervous, Anxious, on Edge 1 1 1 1   Control/stop worrying 0 1 1 1   Worry too much - different things 1 1 0 0  Trouble relaxing 1 1 0 1  Restless 1 0 0 0  Easily annoyed or irritable 1 0 0 0  Afraid - awful might happen 1 0 0 0  Total GAD 7 Score 6 4 2 3   Anxiety Difficulty Not difficult at all Not difficult at all Not difficult at all Not difficult at all      4. Low serum vitamin B12 Lab Results  Component Value Date   VITAMINB12 1,087 03/12/2023     5. BMI 45.0-49.9, adult (HCC) Weight is down 4 lbs Wt Readings from Last 3 Encounters:  02/27/24 293 lb (132.9 kg)  02/18/24 297 lb 6.4 oz (134.9 kg)  01/15/24 295 lb (133.8 kg)   BMI Readings from Last 3 Encounters:  02/27/24 48.76 kg/m  02/18/24 49.49 kg/m  01/15/24 49.09 kg/m      New complaints: None today  Allergies  Allergen Reactions   Egg-Derived Products Swelling    Swells mouth with itching   Fluarix [Influenza Virus Vaccine] Swelling    Contains egg protein.   Other Anaphylaxis    All Tree-Nuts   Peanut-Containing Drug Products Anaphylaxis    Penicillins Rash and Anaphylaxis    .Has patient had a PCN reaction causing immediate rash, facial/tongue/throat swelling, SOB or lightheadedness with hypotension: Yes Has patient had a PCN reaction causing severe rash involving mucus membranes or skin necrosis: No Has patient had a PCN reaction that required hospitalization: no Has patient had a PCN reaction occurring within the last 10 years: no If all of the above answers are "NO", then may proceed with Cephalosporin use.  Other reaction(s): Other (See Comments) .Has patient had a PCN reaction causing immediate rash, facial/tongue/throat swelling, SOB or lightheadedness with hypotension: Yes Has patient had a PCN reaction causing severe rash involving mucus membranes or skin necrosis: No Has patient had a PCN reaction that required hospitalization: no Has patient had a PCN reaction occurring within the last 10 years: no If all of the above answers are "NO", then may proceed with Cephalosporin use.   Apple Itching    Mouth itching   Blueberry Flavoring Agent (Non-Screening) Itching   Kiwi Extract Itching    Per pt tongue and throat itching/ burning   Watermelon Flavoring Agent (Non-Screening) Itching    And Canteloupe   Amoxicillin Rash  Sulfa Antibiotics Rash    numbness   Outpatient Encounter Medications as of 03/21/2024  Medication Sig   acetaminophen  (TYLENOL ) 650 MG CR tablet Take 650 mg by mouth 2 (two) times daily.   amLODipine  (NORVASC ) 2.5 MG tablet TAKE 1 TABLET BY MOUTH EVERY DAY   azelastine  (ASTELIN ) 0.1 % nasal spray Place 2 sprays into both nostrils 2 (two) times daily. Use in each nostril as directed   Carboxymethylcellul-Glycerin (CLEAR EYES FOR DRY EYES OP) Place 1-2 drops into both eyes daily as needed (dry eyes).   Cholecalciferol (VITAMIN D3) 50 MCG (2000 UT) TABS Take 1 tablet by mouth daily after lunch.   Cyanocobalamin  (B-12 COMPLIANCE INJECTION IJ) Inject as directed every 30 (thirty) days.   EPINEPHrine  0.3  mg/0.3 mL IJ SOAJ injection Inject 0.3 mg into the muscle as needed.   escitalopram  (LEXAPRO ) 10 MG tablet TAKE 1 TABLET BY MOUTH EVERY DAY   fexofenadine  (ALLEGRA ) 180 MG tablet TAKE 1 TABLET BY MOUTH EVERY DAY   fluticasone  (FLONASE ) 50 MCG/ACT nasal spray Place 2 sprays into both nostrils daily. SPRAY 2 SPRAYS INTO EACH NOSTRIL EVERY DAY   hydrochlorothiazide  (HYDRODIURIL ) 25 MG tablet TAKE 1 TABLET (25 MG TOTAL) BY MOUTH DAILY.   ibuprofen  (ADVIL ) 800 MG tablet Take 800 mg by mouth every 8 (eight) hours as needed.   Krill Oil 350 MG CAPS Take 1 capsule by mouth daily.   metoprolol  tartrate (LOPRESSOR ) 25 MG tablet TAKE 1/2 TABLET (12.5 MG TOTAL) BY MOUTH 2 (TWO) TIMES DAILY AS NEEDED (PALPITATIONS).   nystatin  (MYCOSTATIN /NYSTOP ) powder Apply 1 Application topically 3 (three) times daily. Apply to affected area for up to 7 days (Patient taking differently: Apply 1 Application topically 3 (three) times daily as needed. Apply to affected area for up to 7 days)   omeprazole  (PRILOSEC) 20 MG capsule TAKE 1 CAPSULE BY MOUTH EVERY DAY   Probiotic Product (PROBIOTIC PO) Take 1 capsule by mouth daily.   rosuvastatin  (CRESTOR ) 10 MG tablet TAKE 1 TABLET BY MOUTH EVERY DAY   tacrolimus  (PROTOPIC ) 0.1 % ointment Apply topically 2 (two) times daily.   triamcinolone  ointment (KENALOG ) 0.5 % Apply 1 Application topically 2 (two) times daily.   Facility-Administered Encounter Medications as of 03/21/2024  Medication   cyanocobalamin  (VITAMIN B12) injection 1,000 mcg    Past Surgical History:  Procedure Laterality Date   COLONOSCOPY WITH PROPOFOL  N/A 08/24/2023   Procedure: COLONOSCOPY WITH PROPOFOL ;  Surgeon: Alvis Jourdain, MD;  Location: [facility];  Service: Gastroenterology;  Laterality: N/A;   CYSTOSCOPY N/A 09/07/2023   Procedure: CYSTOSCOPY;  Surgeon: Reinaldo Caras, MD;  Location: [facility];  Service: Gynecology;  Laterality: N/A;   DILATATION & CURETTAGE/HYSTEROSCOPY  WITH MYOSURE N/A 07/11/2022   Procedure: DILATATION & CURETTAGE/HYSTEROSCOPY WITH MYOSURE RESECTION OF ENDOMETRIAL POLYPS;  Surgeon: Greta Leatherwood, MD;  Location: [facility];  Service: Gynecology;  Laterality: N/A;   POLYPECTOMY  08/24/2023   Procedure: POLYPECTOMY;  Surgeon: Alvis Jourdain, MD;  Location: [facility];  Service: Gastroenterology;;   ROBOTIC ASSISTED TOTAL HYSTERECTOMY WITH BILATERAL SALPINGO OOPHERECTOMY Bilateral 09/07/2023   Procedure: XI ROBOTIC ASSISTED TOTAL HYSTERECTOMY WITH BILATERAL SALPINGO OOPHORECTOMY;  Surgeon: Reinaldo Caras, MD;  Location: [facility];  Service: Gynecology;  Laterality: Bilateral;    Family History  Problem Relation Age of Onset   Hypertension Mother    Diabetes Mother    Other Mother        renal disease  Hypertension Father    Heart attack Father    Allergic rhinitis Sister    Hypertension Sister    Allergic rhinitis Sister    Allergic rhinitis Brother    Diabetes Brother    Allergic rhinitis Brother    Diabetes Brother    Allergic rhinitis Brother    Allergic rhinitis Brother    Sleep apnea Neg Hx       Controlled substance contract: n/a     Review of Systems  Constitutional:  Negative for diaphoresis.  Eyes:  Negative for pain.  Respiratory:  Negative for shortness of breath.   Cardiovascular:  Negative for chest pain, palpitations and leg swelling.  Gastrointestinal:  Negative for abdominal pain.  Endocrine: Negative for polydipsia.  Skin:  Negative for rash.  Neurological:  Negative for dizziness, weakness and headaches.  Hematological:  Does not bruise/bleed easily.  All other systems reviewed and are negative.      Objective:   Physical Exam Vitals and nursing note reviewed.  Constitutional:      General: She is not in acute distress.    Appearance: Normal appearance. She is well-developed.  HENT:     Head: Normocephalic.     Right Ear: Tympanic membrane  normal.     Left Ear: Tympanic membrane normal.     Nose: Nose normal.     Mouth/Throat:     Mouth: Mucous membranes are moist.  Eyes:     Pupils: Pupils are equal, round, and reactive to light.  Neck:     Vascular: No carotid bruit or JVD.  Cardiovascular:     Rate and Rhythm: Normal rate and regular rhythm.     Heart sounds: Normal heart sounds.  Pulmonary:     Effort: Pulmonary effort is normal. No respiratory distress.     Breath sounds: Normal breath sounds. No wheezing or rales.  Chest:     Chest wall: No tenderness.  Abdominal:     General: Bowel sounds are normal. There is no distension or abdominal bruit.     Palpations: Abdomen is soft. There is no hepatomegaly, splenomegaly, mass or pulsatile mass.     Tenderness: There is no abdominal tenderness.  Musculoskeletal:        General: Normal range of motion.     Cervical back: Normal range of motion and neck supple.     Comments: FROM of right wrist without pain Grips equal bil  Lymphadenopathy:     Cervical: No cervical adenopathy.  Skin:    General: Skin is warm and dry.  Neurological:     Mental Status: She is alert and oriented to person, place, and time.     Deep Tendon Reflexes: Reflexes are normal and symmetric.  Psychiatric:        Behavior: Behavior normal.        Thought Content: Thought content normal.        Judgment: Judgment normal.    LMP 12/27/2022 (Approximate)   BP 116/69   Pulse (!) 56   Temp (!) 97.3 F (36.3 C) (Temporal)   Ht 5\' 5"  (1.651 m)   Wt 292 lb (132.5 kg)   LMP 12/27/2022 (Approximate)   SpO2 99%   BMI 48.59 kg/m        Assessment & Plan:   Renee Thomas comes in today with chief complaint of medical management of chronic issues    Diagnosis and orders addressed:  1. Primary hypertension Low sodium diet - CBC with Differential/Platelet - CMP14+EGFR -  Lipid panel - Thyroid  Panel With TSH - Bayer DCA Hb A1c Waived - amLODipine  (NORVASC ) 2.5 MG tablet; Take 1  tablet (2.5 mg total) by mouth daily.  Dispense: 90 tablet; Refill: 1 - hydrochlorothiazide  (HYDRODIURIL ) 25 MG tablet; Take 1 tablet (25 mg total) by mouth daily.  Dispense: 90 tablet; Refill: 1 - metoprolol  tartrate (LOPRESSOR ) 25 MG tablet; TAKE 1/2 TABLET (12.5 MG TOTAL) BY MOUTH 2 (TWO) TIMES DAILY AS NEEDED (PALPITATIONS).  Dispense: 90 tablet; Refill: 1  2. GERD without esophagitis Avoid spicy foods Do not eat 2 hours prior to bedtime  - omeprazole  (PRILOSEC) 20 MG capsule; Take 1 capsule (20 mg total) by mouth daily.  Dispense: 90 capsule; Refill: 1  3. GAD (generalized anxiety disorder) Stress management - escitalopram  (LEXAPRO ) 10 MG tablet; Take 1 tablet (10 mg total) by mouth daily.  Dispense: 90 tablet; Refill: 1  4. Low serum vitamin B12   5. Morbid obesity Discussed diet and exercise for person with BMI >25 Will recheck weight in 3-6 months   6. Pure hypercholesterolemia Low fat diet - rosuvastatin  (CRESTOR ) 10 MG tablet; Take 1 tablet (10 mg total) by mouth daily.  Dispense: 90 tablet; Refill: 1  7. Allergy , initial encounter - Ambulatory referral to Allergy   Labs pending Health Maintenance reviewed Diet and exercise encouraged  Follow up plan: 6 months   [name] Gaylyn Keas, FNP

## 2024-03-21 NOTE — Patient Instructions (Signed)
 Fall Prevention in the Home, Adult Falls can cause injuries and can happen to people of all ages. There are many things you can do to make your home safer and to help prevent falls. What actions can I take to prevent falls? General information Use good lighting in all rooms. Make sure to: Replace any light bulbs that burn out. Turn on the lights in dark areas and use night-lights. Keep items that you use often in easy-to-reach places. Lower the shelves around your home if needed. Move furniture so that there are clear paths around it. Do not use throw rugs or other things on the floor that can make you trip. If any of your floors are uneven, fix them. Add color or contrast paint or tape to clearly mark and help you see: Grab bars or handrails. First and last steps of staircases. Where the edge of each step is. If you use a ladder or stepladder: Make sure that it is fully opened. Do not climb a closed ladder. Make sure the sides of the ladder are locked in place. Have someone hold the ladder while you use it. Know where your pets are as you move through your home. What can I do in the bathroom?     Keep the floor dry. Clean up any water on the floor right away. Remove soap buildup in the bathtub or shower. Buildup makes bathtubs and showers slippery. Use non-skid mats or decals on the floor of the bathtub or shower. Attach bath mats securely with double-sided, non-slip rug tape. If you need to sit down in the shower, use a non-slip stool. Install grab bars by the toilet and in the bathtub and shower. Do not use towel bars as grab bars. What can I do in the bedroom? Make sure that you have a light by your bed that is easy to reach. Do not use any sheets or blankets on your bed that hang to the floor. Have a firm chair or bench with side arms that you can use for support when you get dressed. What can I do in the kitchen? Clean up any spills right away. If you need to reach something  above you, use a step stool with a grab bar. Keep electrical cords out of the way. Do not use floor polish or wax that makes floors slippery. What can I do with my stairs? Do not leave anything on the stairs. Make sure that you have a light switch at the top and the bottom of the stairs. Make sure that there are handrails on both sides of the stairs. Fix handrails that are broken or loose. Install non-slip stair treads on all your stairs if they do not have carpet. Avoid having throw rugs at the top or bottom of the stairs. Choose a carpet that does not hide the edge of the steps on the stairs. Make sure that the carpet is firmly attached to the stairs. Fix carpet that is loose or worn. What can I do on the outside of my home? Use bright outdoor lighting. Fix the edges of walkways and driveways and fix any cracks. Clear paths of anything that can make you trip, such as tools or rocks. Add color or contrast paint or tape to clearly mark and help you see anything that might make you trip as you walk through a door, such as a raised step or threshold. Trim any bushes or trees on paths to your home. Check to see if handrails are loose  or broken and that both sides of all steps have handrails. Install guardrails along the edges of any raised decks and porches. Have leaves, snow, or ice cleared regularly. Use sand, salt, or ice melter on paths if you live where there is ice and snow during the winter. Clean up any spills in your garage right away. This includes grease or oil spills. What other actions can I take? Review your medicines with your doctor. Some medicines can cause dizziness or changes in blood pressure, which increase your risk of falling. Wear shoes that: Have a low heel. Do not wear high heels. Have rubber bottoms and are closed at the toe. Feel good on your feet and fit well. Use tools that help you move around if needed. These include: Canes. Walkers. Scooters. Crutches. Ask  your doctor what else you can do to help prevent falls. This may include seeing a physical therapist to learn to do exercises to move better and get stronger. Where to find more information Centers for Disease Control and Prevention, STEADI: TonerPromos.no [employer] on Aging: BaseRingTones.pl National Institute on Aging: BaseRingTones.pl Contact a doctor if: You are afraid of falling at home. You feel weak, drowsy, or dizzy at home. You fall at home. Get help right away if you: Lose consciousness or have trouble moving after a fall. Have a fall that causes a head injury. These symptoms may be an emergency. Get help right away. Call 911. Do not wait to see if the symptoms will go away. Do not drive yourself to the hospital. This information is not intended to replace advice given to you by your health care provider. Make sure you discuss any questions you have with your health care provider. Document Revised: 06/26/2022 Document Reviewed: 06/26/2022 Elsevier Patient Education  2024 [employer].

## 2024-03-22 LAB — CBC WITH DIFFERENTIAL/PLATELET
Basophils Absolute: 0 10*3/uL (ref 0.0–0.2)
Basos: 1 %
EOS (ABSOLUTE): 0.2 10*3/uL (ref 0.0–0.4)
Eos: 3 %
Hematocrit: 39.5 % (ref 34.0–46.6)
Hemoglobin: 12.5 g/dL (ref 11.1–15.9)
Immature Grans (Abs): 0 10*3/uL (ref 0.0–0.1)
Immature Granulocytes: 0 %
Lymphocytes Absolute: 2.2 10*3/uL (ref 0.7–3.1)
Lymphs: 42 %
MCH: 28.5 pg (ref 26.6–33.0)
MCHC: 31.6 g/dL (ref 31.5–35.7)
MCV: 90 fL (ref 79–97)
Monocytes Absolute: 0.4 10*3/uL (ref 0.1–0.9)
Monocytes: 8 %
Neutrophils Absolute: 2.4 10*3/uL (ref 1.4–7.0)
Neutrophils: 46 %
Platelets: 337 10*3/uL (ref 150–450)
RBC: 4.39 x10E6/uL (ref 3.77–5.28)
RDW: 13.8 % (ref 11.7–15.4)
WBC: 5.2 10*3/uL (ref 3.4–10.8)

## 2024-03-22 LAB — LIPID PANEL
Chol/HDL Ratio: 3 ratio (ref 0.0–4.4)
Cholesterol, Total: 152 mg/dL (ref 100–199)
HDL: 51 mg/dL (ref >39)
LDL Chol Calc (NIH): 86 mg/dL (ref 0–99)
Triglycerides: 76 mg/dL (ref 0–149)
VLDL Cholesterol Cal: 15 mg/dL (ref 5–40)

## 2024-03-22 LAB — CMP14+EGFR
ALT: 20 IU/L (ref 0–32)
AST: 16 IU/L (ref 0–40)
Albumin: 4.5 g/dL (ref 3.8–4.9)
Alkaline Phosphatase: 149 IU/L — ABNORMAL HIGH (ref 44–121)
BUN/Creatinine Ratio: 23 (ref 9–23)
BUN: 20 mg/dL (ref 6–24)
Bilirubin Total: 0.4 mg/dL (ref 0.0–1.2)
CO2: 23 mmol/L (ref 20–29)
Calcium: 10.1 mg/dL (ref 8.7–10.2)
Chloride: 99 mmol/L (ref 96–106)
Creatinine, Ser: 0.87 mg/dL (ref 0.57–1.00)
Globulin, Total: 3.2 g/dL (ref 1.5–4.5)
Glucose: 86 mg/dL (ref 70–99)
Potassium: 4.1 mmol/L (ref 3.5–5.2)
Sodium: 137 mmol/L (ref 134–144)
Total Protein: 7.7 g/dL (ref 6.0–8.5)
eGFR: 79 mL/min/{1.73_m2} (ref >59)

## 2024-03-24 ENCOUNTER — Ambulatory Visit: Payer: Self-pay | Admitting: Nurse Practitioner

## 2024-04-02 ENCOUNTER — Ambulatory Visit

## 2024-04-09 ENCOUNTER — Ambulatory Visit (INDEPENDENT_AMBULATORY_CARE_PROVIDER_SITE_OTHER)

## 2024-04-09 DIAGNOSIS — E538 Deficiency of other specified B group vitamins: Secondary | ICD-10-CM

## 2024-04-09 MED ORDER — CYANOCOBALAMIN 1000 MCG/ML IJ SOLN
1000.0000 ug | INTRAMUSCULAR | Status: AC
  Administered 2024-04-09 – 2024-11-24 (×8): 1000 ug via INTRAMUSCULAR

## 2024-04-09 NOTE — Progress Notes (Signed)
 Patient is in office today for a nurse visit for B12 Injection. Patient Injection was given in the  Left deltoid. Patient tolerated injection well.

## 2024-05-12 ENCOUNTER — Ambulatory Visit (INDEPENDENT_AMBULATORY_CARE_PROVIDER_SITE_OTHER)

## 2024-05-12 DIAGNOSIS — E538 Deficiency of other specified B group vitamins: Secondary | ICD-10-CM

## 2024-05-12 NOTE — Progress Notes (Signed)
 Patient is in office today for a nurse visit for B12 Injection. Patient Injection was given in the  Right deltoid. Patient tolerated injection well.

## 2024-05-23 ENCOUNTER — Other Ambulatory Visit: Payer: Self-pay | Admitting: Allergy & Immunology

## 2024-06-13 ENCOUNTER — Ambulatory Visit

## 2024-06-20 ENCOUNTER — Ambulatory Visit (INDEPENDENT_AMBULATORY_CARE_PROVIDER_SITE_OTHER): Admitting: *Deleted

## 2024-06-20 DIAGNOSIS — E538 Deficiency of other specified B group vitamins: Secondary | ICD-10-CM

## 2024-06-20 NOTE — Progress Notes (Signed)
 Patient is in office today for a nurse visit for B12 Injection. Patient Injection was given in the  Left deltoid. Patient tolerated injection well.

## 2024-07-21 ENCOUNTER — Ambulatory Visit (INDEPENDENT_AMBULATORY_CARE_PROVIDER_SITE_OTHER): Admitting: *Deleted

## 2024-07-21 DIAGNOSIS — E538 Deficiency of other specified B group vitamins: Secondary | ICD-10-CM | POA: Diagnosis not present

## 2024-07-21 NOTE — Progress Notes (Signed)
 Patient is in office today for a nurse visit for B12 Injection. Patient Injection was given in the  Right deltoid. Patient tolerated injection well.

## 2024-07-24 ENCOUNTER — Other Ambulatory Visit: Payer: Self-pay | Admitting: Nurse Practitioner

## 2024-08-21 ENCOUNTER — Ambulatory Visit: Admitting: *Deleted

## 2024-08-21 DIAGNOSIS — E538 Deficiency of other specified B group vitamins: Secondary | ICD-10-CM | POA: Diagnosis not present

## 2024-08-21 NOTE — Progress Notes (Signed)
 Patient is in office today for a nurse visit for B12 Injection. Patient Injection was given in the  Right deltoid. Patient tolerated injection well.

## 2024-08-29 ENCOUNTER — Other Ambulatory Visit: Payer: Self-pay

## 2024-08-29 ENCOUNTER — Ambulatory Visit: Admitting: Allergy & Immunology

## 2024-08-29 VITALS — BP 124/86 | HR 63 | Temp 97.8°F

## 2024-08-29 DIAGNOSIS — L2089 Other atopic dermatitis: Secondary | ICD-10-CM | POA: Diagnosis not present

## 2024-08-29 DIAGNOSIS — J302 Other seasonal allergic rhinitis: Secondary | ICD-10-CM

## 2024-08-29 DIAGNOSIS — J3089 Other allergic rhinitis: Secondary | ICD-10-CM

## 2024-08-29 DIAGNOSIS — T7805XD Anaphylactic reaction due to tree nuts and seeds, subsequent encounter: Secondary | ICD-10-CM

## 2024-08-29 MED ORDER — DOXYCYCLINE MONOHYDRATE 100 MG PO CAPS: 100.0000 mg | ORAL_CAPSULE | Freq: Two times a day (BID) | ORAL | 0 refills | Status: AC

## 2024-08-29 MED ORDER — TACROLIMUS 0.1 % EX OINT: TOPICAL_OINTMENT | Freq: Two times a day (BID) | CUTANEOUS | 0 refills | Status: AC

## 2024-08-29 NOTE — Patient Instructions (Addendum)
 Seasonal and perennial allergic rhinitis (grasses, ragweed, weeds, trees, indoor molds, outdoor molds, dust mites, and cockroach - Continue Allegra  180 mg once a day if needed for runny nose or itch.  You may take an additional dose of Allegra  180 mg once a day if needed for breakthrough symptoms. - Continue Flonase  2 sprays in each nostril once a day if needed for stuffy nose.  In the right nostril, point the applicator out toward the right ear. In the left nostril, point the applicator out toward the left ear - Continue azelastine  2 sprays in each nostril up to twice a day if needed for a runny nose - Consider saline nasal rinses as needed for nasal symptoms. Use this before any medicated nasal sprays for best result - Consider allergen immunotherapy if your symptoms are not well-controlled with the treatment plan as listed above - Start doxycycline  100mg  TWICE DAILY for 7 days to treat the current sinus infection.   2. Atopic dermatitis - Continue with twice a day moisturizing routine - Continue with the triamcinolone  up to twice a day if needed.  - Samples of Vtama provide to use daily as needed (this is NOT a steroid and can be used on the face).  - The upper arm rash looks like keratosis pilaris (information provided). - You can try using Amlactin daily to help break up the hair follicles.  3. Food allergy  (peanuts, tree nuts) - Continue to avoid peanuts and tree nuts.  In case of an allergic reaction, take Benadryl  50 mg every 4 hours, and if life-threatening symptoms occur, inject with EpiPen  0.3 mg. - Consider oral food challenge to peanuts and selected tree nuts.  Remember to stop antihistamines for 3 days before your challenge appointment  4. Return in about 6 months (around 02/27/2025). You can have the follow up appointment with Dr. Iva or a Nurse Practicioner (our Nurse Practitioners are excellent and always have Physician oversight!).    Please inform us  of any Emergency  Department visits, hospitalizations, or changes in symptoms. Call us  before going to the ED for breathing or allergy  symptoms since we might be able to fit you in for a sick visit. Feel free to contact us  anytime with any questions, problems, or concerns.  It was a pleasure to see you and your family again today!  Websites that have reliable patient information: 1. American Academy of Asthma, Allergy , and Immunology: www.aaaai.org 2. Food Allergy  Research and Education (FARE): foodallergy.org 3. Mothers of Asthmatics: http://www.asthmacommunitynetwork.org 4. [employer] of Allergy , Asthma, and Immunology: www.acaai.org      "Like" us  on Facebook and Instagram for our latest updates!      A healthy democracy works best when [employer] participate! Make sure you are registered to vote! If you have moved or changed any of your contact information, you will need to get this updated before voting! Scan the QR codes below to learn more!      What is keratosis pilaris? Keratosis pilaris is a common skin condition, which appears as tiny bumps on the skin. Some people say these bumps look like goosebumps or the skin of a plucked chicken. Others mistake the bumps for small pimples. These rough-feeling bumps are actually plugs of dead skin cells. The plugs appear most often on the upper arms and thighs (front). Children may have these bumps on their cheeks. Because keratosis pilaris is harmless, you don't need to treat it. If the itch, dryness, or the appearance of these bumps bothers you,  treatment can help. Treatment can ease the symptoms and help you see clearer skin. Treating dry skin often helps. Dry skin can make these bumps more noticeable. In fact, many people say the bumps clear during the summer only to return in the winter. If you decide not to treat these bumps and live in a dry climate or frequently swim in a pool, you may see these bumps year-round.      Who gets keratosis  pilaris? People of all ages and races have this common skin condition. For most people, it begins at one of the following times: Before 55 years of age During the teenage years Because keratosis pilaris usually begins early in life, children and teenagers are most likely to have this skin condition. Fewer adults have it because keratosis pilaris can fade and gradually disappear. The bumps may clear by the time a child reaches late childhood or adolescence. Hormones, however, may cause another flare-up around puberty. When keratosis pilaris develops in the teenage years, it often clears by one's mid-20s. Keratosis pilaris can also continue into one's adult years. Women are a bit more likely to have keratosis pilaris.  What increases a person's risk of getting keratosis pilaris? You are more likely to develop it if you have one or more of the following: Close blood relatives who have keratosis pilaris Asthma Dry skin Eczema (atopic dermatitis) Excess body weight, which makes you overweight or obese Hay fever Ichthyosis vulgaris (a skin condition that causes very dry skin)  What causes keratosis pilaris? Keratosis pilaris is not contagious. We get keratosis pilaris when dead skin cells clog our pores. A pore is also called a hair follicle. Every hair on our body grows out of a hair follicle, so we have thousands of hair follicles. When dead skin cells clog many hair follicles, you feel the rough, dry patches of keratosis pilaris.  How do we treat keratosis pilaris? This skin condition is harmless, so you don't need to treat it. If the itch, dryness, or the appearance of your skin bothers you, treatment can help. A doctor can create a treatment plan that addresses your concerns.   Relieve the itch and dryness: A creamy moisturizer can soothe the itch and dryness.   For best results, apply your moisturizer: After every shower or bath Within 5 minutes of getting out of the bath or shower, while  your skin is still damp At least 2 or 3 times a day, gently massaging it into the skin with keratosis pilaris  Diminish the bumpy appearance: To diminish the bumps and improve your skin's texture, doctors often recommend exfoliating (removing dead skin cells from the surface of your skin). Your doctor may recommend that you gently remove dead skin with a loofah or at-home microdermabrasion kit. Your doctor may also prescribe a medicine that will remove dead skin cells. Medicine that can help often contains one of the following ingredients: Alpha hydroxyl acid Glycolic acid Lactic acid Urea  What is the outcome for people with keratosis pilaris? For many people, keratosis pilaris goes away with time, even if you opt not to treat it. Clearing tends to happen gradually over many years. There is no way to know who will see keratosis pilaris clear.  Treating keratosis pilaris at home  Some people see clearer skin by treating their keratosis pilaris at home. Because you cannot cure keratosis pilaris, you'll need to follow a maintenance plan. This often involves treating your skin a few times a week.  Exfoliate gently.  When you exfoliate your skin, you remove the dead skin cells from the surface. You can slough off these dead cells gently with a loofah, buff puff, or rough washcloth. Avoid scrubbing your skin, which tends to irritate the skin and worsen keratosis pilaris. Apply a product called a keratolytic. After exfoliating, apply this skin care product. It, too, helps remove the excessive buildup of dead skin cells. Another name for this product is IT sales professional. Take care to use a keratolytic exactly as described in the directions. Applying too much or using it more often than indicated can cause raw, irritated skin. Slather on moisturizer. Using a keratolyic dries the skin, so you'll want to apply a moisturizer afterwards. Dermatologists recommend using an oil-free cream or ointment to help  prevent clogged pores.    You'll also need to take some precautions to prevent flare-ups. The following tips can help.  Tips to prevent flare-ups Moisturize your skin: Keratosis pilaris often flares when the skin becomes dry. Applying a moisturizer can prevent dry skin.  For best results when using a moisturizer: Select a thick oil-free cream or ointment rather than a lotion Use a moisturizer that contains urea or lactic acid Apply it to damp skin within 5 minutes of bathing Slather it on when your skin feels dry  Take short showers and baths: To prevent drying your skin, take a short (20 minutes or less) bath or shower and use warm rather than hot water . Also, limit bathing to once a day.   Use a mild cleanser: Bar soap can dry your skin.

## 2024-08-29 NOTE — Progress Notes (Signed)
 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  You may take an additional dose of Allegra  180 mg once a day if needed for breakthrough symptoms. - Continue Flonase  2 sprays in each nostril once a day if needed for stuffy nose.  In the right nostril, point the applicator out toward the right ear. In the left nostril, point the applicator out toward the left ear - Continue azelastine  2 sprays in each nostril up to twice a day if needed for a runny nose - Consider saline nasal rinses as needed for nasal symptoms. Use this before any medicated nasal sprays for best result - Consider allergen immunotherapy if your symptoms are not well-controlled with the treatment plan as listed above - Start doxycycline  100mg  TWICE DAILY for 7 days to treat the current sinus infection.   2. Atopic dermatitis - Continue with twice a day moisturizing routine - Continue with the triamcinolone  up to twice a day if needed.  - Samples of Vtama provide to use daily as needed (this is NOT a steroid and can be used on the face).  - The upper arm rash looks like keratosis pilaris (information provided). - You can try using Amlactin daily to help break up the hair follicles.  3. Food allergy  (peanuts, tree nuts) - Continue to avoid peanuts and tree nuts.  In case of an allergic reaction, take Benadryl  50 mg every 4 hours, and if life-threatening symptoms occur, inject with  EpiPen  0.3 mg. - Consider oral food challenge to peanuts and selected tree nuts.  Remember to stop antihistamines for 3 days before your challenge appointment  4. Return in about 6 months (around 02/27/2025). You can have the follow up appointment with Dr. Iva or a Nurse Practicioner (our Nurse Practitioners are excellent and always have Physician oversight!).   Subjective:   Renee Thomas is a 53 y.o. female presenting today for follow up of  Chief Complaint  Patient presents with   Follow-up    Patient reports having some skin issues. Not sure what if she needs to see derm.  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  Discussed the use of AI scribe software for clinical note transcription with the patient and/or guardian, who gave verbal consent to proceed.  Renee Thomas is a 53 y.o. female presenting for a follow up visit.  He last saw and in April 2025.  At that time, she was continued on Allegra , Flonase , and Astelin .  She was also started on tacrolimus  for her atopic dermatitis and continue with triamcinolone .  She continue to avoid peanuts and tree nuts.  We talked about doing an oral food challenge to peanuts and tree nuts.  Since last visit, she has done well.   Allergic Rhinitis Symptom History: Her environmental allergies have been exacerbated recently, with symptoms including postnasal drainage and a sensation of fullness  in her ear for about a month. She has been using Flonase , Mucinex decongestants, azelastine , and Allegra , which have provided some relief. No fever and no recent use of antibiotics. She reports ear fullness for around 2-3 weeks in total.   Food Allergy  Symptom History: She has a known allergy  to peanuts and tree nuts and has been avoiding them without issue. Her EpiPen  is set to expire in November 2025.  Skin Symptom History: She reports that her eczema is better and she has been using triamcinolone  and CeraVe. She did not purchase the prescribed tacrolimus  ointment due to its high cost. She uses triamcinolone  occasionally for severe flares. She also reports an itchy spot behind her ear, particularly in hot weather.  Otherwise, there have been no changes to her past medical history, surgical history, family history, or social history.    Review of systems otherwise negative other than that mentioned in the HPI.    Objective:   Blood pressure 124/86, pulse 63, temperature 97.8 F (36.6 C), temperature source Temporal, last menstrual period 12/27/2022. There is no height or weight on file to calculate BMI.    Physical Exam Vitals reviewed.  Constitutional:      Appearance: She is well-developed.     Comments: Pleasant.   HENT:     Head: Normocephalic and atraumatic.     Right Ear: Tympanic membrane, ear canal and external ear normal. No drainage, swelling or tenderness. Tympanic membrane is not injected, scarred, erythematous, retracted or bulging.     Left Ear: Tympanic membrane, ear canal and external ear normal. No drainage, swelling or tenderness. Tympanic membrane is not injected, scarred, erythematous, retracted or bulging.     Nose: No nasal deformity, septal deviation, mucosal edema or rhinorrhea.     Right Turbinates: Enlarged, swollen and pale.     Left Turbinates: Enlarged, swollen and pale.     Right Sinus: No maxillary sinus tenderness or frontal sinus tenderness.      Left Sinus: No maxillary sinus tenderness or frontal sinus tenderness.     Comments: No polyps noted.     Mouth/Throat:     Mouth: Mucous membranes are not pale and not dry.     Pharynx: Uvula midline.  Eyes:     General:        Right eye: No discharge.        Left eye: No discharge.     Conjunctiva/sclera: Conjunctivae normal.     Right eye: Right conjunctiva is not injected. No chemosis.    Left eye: Left conjunctiva is not injected. No chemosis.    Pupils: Pupils are equal, round, and reactive to light.  Cardiovascular:     Rate and Rhythm: Normal rate and regular rhythm.     Heart sounds: Normal heart sounds.  Pulmonary:     Effort: Pulmonary effort is normal. No tachypnea, accessory muscle usage or respiratory distress.     Breath sounds: Normal breath sounds. No wheezing, rhonchi or rales.     Comments: Moving air well in  all lung fields. No increased work of breathing noted. Chest:     Chest wall: No tenderness.  Abdominal:     Tenderness: There is no abdominal tenderness. There is no guarding or rebound.  Lymphadenopathy:     Head:     Right side of head: No submandibular, tonsillar or occipital adenopathy.     Left side of head: No submandibular, tonsillar or occipital adenopathy.     Cervical: No cervical adenopathy.  Skin:    Coloration: Skin is not pale.     Findings: No abrasion, erythema, petechiae or rash. Rash is not papular, urticarial or vesicular.  Neurological:     Mental Status: She is alert.  Psychiatric:        Behavior: Behavior is cooperative.      Diagnostic studies: none     Marty Shaggy, MD  [facility] 

## 2024-08-31 ENCOUNTER — Encounter: Payer: Self-pay | Admitting: Allergy & Immunology

## 2024-09-02 ENCOUNTER — Encounter: Payer: Self-pay | Admitting: Obstetrics and Gynecology

## 2024-09-02 NOTE — Progress Notes (Unsigned)
 53 y.o. G0P0000 Single African American female here for a breast and pelvic exam.    The patient is also followed for ***.  PCP: Gladis Mustard, FNP   Patient's last menstrual period was 12/27/2022 (approximate).           Sexually active: No.  The current method of family planning is abstinence.    Menopausal hormone therapy:  n/a Exercising: {yes no:314532}  {types:19826} Smoker:  no  OB History     Gravida  0   Para  0   Term  0   Preterm  0   AB  0   Living  0      SAB  0   IAB  0   Ectopic  0   Multiple  0   Live Births              HEALTH MAINTENANCE: Last 2 paps: 03/23/22 neg HR HPV neg , 09/26/19 neg History of abnormal Pap or positive HPV:  no Mammogram:  10/11/23 Breast Density Cat A, BIRADS Cat 1 neg  Colonoscopy:  08/24/23 due every 7 years  Bone Density:   Result  ***   Immunization History  Administered Date(s) Administered   Influenza Inj Mdck Quad Pf 08/20/2019, 09/01/2020, 09/23/2021, 08/16/2022   Influenza Split 08/07/2019   Influenza, Mdck, Trivalent,PF 6+ MOS(egg free) 07/30/2023   Influenza, Quadrivalent, Recombinant, Inj, Pf 09/30/2018   Moderna SARS-COV2 Booster Vaccination 11/01/2020   Moderna Sars-Covid-2 Vaccination 02/04/2020, 03/26/2020   Tdap 11/06/2001, 06/05/2013   Zoster Recombinant(Shingrix ) 03/12/2023, 09/27/2023      reports that she has never smoked. She has been exposed to tobacco smoke. She has never used smokeless tobacco. She reports that she does not drink alcohol and does not use drugs.  Past Medical History:  Diagnosis Date   Anemia    Arthritis    neck, right foot, left shoulder   Balance disorder    06-27-2022  per pt at times feels off balance without dizziness , stated has had neurology/ cardiology/ and ENT work-up without etiology   Eczema    GERD (gastroesophageal reflux disease)    Hypertension    MVP (mitral valve prolapse)    no evidence per echo in epic 10-21-2020, has trivial  mitral valve regurg   OSA on CPAP 10/2021   followed by neurologist--- dr buck;  sleep study in epic 10-26-2021 severe osa ( 06-27-2022 per pt uses apap nightly)   PMB (postmenopausal bleeding)    w/ hx endometrial polyp   Pre-diabetes    PVC's (premature ventricular contractions)    followed by cardiologist--- dr branch;  last event monitor 11-11-2020  frequent PVCs (6%), NSR w/ rare SVT;   low risk nuclear study 10-21-2020 w/ no ischemia   Vitamin B 12 deficiency    Vitamin D  deficiency 2022   Wears glasses     Past Surgical History:  Procedure Laterality Date   COLONOSCOPY WITH PROPOFOL  N/A 08/24/2023   Procedure: COLONOSCOPY WITH PROPOFOL ;  Surgeon: Rollin Dover, MD;  Location: [facility];  Service: Gastroenterology;  Laterality: N/A;   CYSTOSCOPY N/A 09/07/2023   Procedure: CYSTOSCOPY;  Surgeon: Glennon Almarie POUR, MD;  Location: [facility];  Service: Gynecology;  Laterality: N/A;   DILATATION & CURETTAGE/HYSTEROSCOPY WITH MYOSURE N/A 07/11/2022   Procedure: DILATATION & CURETTAGE/HYSTEROSCOPY WITH MYOSURE RESECTION OF ENDOMETRIAL POLYPS;  Surgeon: Cathlyn JAYSON Nikki Bobie FORBES, MD;  Location: [facility];  Service: Gynecology;  Laterality: N/A;   POLYPECTOMY  08/24/2023   Procedure: POLYPECTOMY;  Surgeon: Rollin Dover, MD;  Location: [facility];  Service: Gastroenterology;;   ROBOTIC ASSISTED TOTAL HYSTERECTOMY WITH BILATERAL SALPINGO OOPHERECTOMY Bilateral 09/07/2023   Procedure: XI ROBOTIC ASSISTED TOTAL HYSTERECTOMY WITH BILATERAL SALPINGO OOPHORECTOMY;  Surgeon: Glennon Almarie POUR, MD;  Location: [facility];  Service: Gynecology;  Laterality: Bilateral;    Current Outpatient Medications  Medication Sig Dispense Refill   acetaminophen  (TYLENOL ) 650 MG CR tablet Take 650 mg by mouth 2 (two) times daily.     amLODipine  (NORVASC ) 2.5 MG tablet Take 1 tablet (2.5 mg total) by mouth daily. 90 tablet 1   Azelastine  HCl 137  MCG/SPRAY SOLN PLACE 2 SPRAYS INTO BOTH NOSTRILS 2 (TWO) TIMES DAILY. USE IN EACH NOSTRIL AS DIRECTED 90 mL 1   Carboxymethylcellul-Glycerin (CLEAR EYES FOR DRY EYES OP) Place 1-2 drops into both eyes daily as needed (dry eyes).     Cholecalciferol (VITAMIN D3) 50 MCG (2000 UT) TABS Take 1 tablet by mouth daily after lunch.     Cyanocobalamin  (B-12 COMPLIANCE INJECTION IJ) Inject as directed every 30 (thirty) days.     doxycycline  (MONODOX ) 100 MG capsule Take 1 capsule (100 mg total) by mouth 2 (two) times daily for 7 days. 14 capsule 0   EPINEPHrine  0.3 mg/0.3 mL IJ SOAJ injection Inject 0.3 mg into the muscle as needed.     escitalopram  (LEXAPRO ) 10 MG tablet Take 1 tablet (10 mg total) by mouth daily. 90 tablet 1   fexofenadine  (ALLEGRA ) 180 MG tablet TAKE 1 TABLET BY MOUTH EVERY DAY 90 tablet 1   fluticasone  (FLONASE ) 50 MCG/ACT nasal spray Place 2 sprays into both nostrils daily. SPRAY 2 SPRAYS INTO EACH NOSTRIL EVERY DAY 16 g 5   hydrochlorothiazide  (HYDRODIURIL ) 25 MG tablet Take 1 tablet (25 mg total) by mouth daily. 90 tablet 1   Krill Oil 350 MG CAPS Take 1 capsule by mouth daily.     metoprolol  tartrate (LOPRESSOR ) 25 MG tablet TAKE 1/2 TABLET (12.5 MG TOTAL) BY MOUTH 2 (TWO) TIMES DAILY AS NEEDED (PALPITATIONS). 90 tablet 1   nystatin  (MYCOSTATIN /NYSTOP ) powder Apply 1 Application topically 3 (three) times daily. Apply to affected area for up to 7 days (Patient taking differently: Apply 1 Application topically 3 (three) times daily as needed. Apply to affected area for up to 7 days) 30 g 2   omeprazole  (PRILOSEC) 20 MG capsule Take 1 capsule (20 mg total) by mouth daily. 90 capsule 1   Probiotic Product (PROBIOTIC PO) Take 1 capsule by mouth daily.     rosuvastatin  (CRESTOR ) 10 MG tablet Take 1 tablet (10 mg total) by mouth daily. 90 tablet 1   tacrolimus  (PROTOPIC ) 0.1 % ointment Apply topically 2 (two) times daily. 100 g 0   triamcinolone  ointment (KENALOG ) 0.5 % Apply 1 Application  topically 2 (two) times daily. 30 g 0   Current Facility-Administered Medications  Medication Dose Route Frequency Provider Last Rate Last Admin   cyanocobalamin  (VITAMIN B12) injection 1,000 mcg  1,000 mcg Intramuscular Q30 days Gladis Mustard, FNP   1,000 mcg at 08/21/24 1121    ALLERGIES: Egg protein-containing drug products, Fluarix [influenza virus vaccine], Other, Peanut-containing drug products, Penicillins, Apple, Blueberry flavoring agent (non-screening), Kiwi extract, Watermelon flavoring agent (non-screening), Amoxicillin, and Sulfa antibiotics  Family History  Problem Relation Age of Onset   Hypertension Mother    Diabetes Mother    Other Mother        renal disease   Hypertension Father  Heart attack Father    Allergic rhinitis Sister    Hypertension Sister    Allergic rhinitis Sister    Allergic rhinitis Brother    Diabetes Brother    Allergic rhinitis Brother    Diabetes Brother    Allergic rhinitis Brother    Allergic rhinitis Brother    Sleep apnea Neg Hx     Review of Systems  PHYSICAL EXAM:  LMP 12/27/2022 (Approximate)     General appearance: alert, cooperative and appears stated age Head: normocephalic, without obvious abnormality, atraumatic Neck: no adenopathy, supple, symmetrical, trachea midline and thyroid  normal to inspection and palpation Lungs: clear to auscultation bilaterally Breasts: normal appearance, no masses or tenderness, No nipple retraction or dimpling, No nipple discharge or bleeding, No axillary adenopathy Heart: regular rate and rhythm Abdomen: soft, non-tender; no masses, no organomegaly Extremities: extremities normal, atraumatic, no cyanosis or edema Skin: skin color, texture, turgor normal. No rashes or lesions Lymph nodes: cervical, supraclavicular, and axillary nodes normal. Neurologic: grossly normal  Pelvic: External genitalia:  no lesions              No abnormal inguinal nodes palpated.              Urethra:   normal appearing urethra with no masses, tenderness or lesions              Bartholins and Skenes: normal                 Vagina: normal appearing vagina with normal color and discharge, no lesions              Cervix: no lesions              Pap taken: {yes no:314532} Bimanual Exam:  Uterus:  normal size, contour, position, consistency, mobility, non-tender              Adnexa: no mass, fullness, tenderness              Rectal exam: {yes no:314532}.  Confirms.              Anus:  normal sphincter tone, no lesions  Chaperone was present for exam:  {BSCHAPERONE:31226::Emily F, CMA}  ASSESSMENT: Encounter for breast and pelvic exam.   ***  PLAN: Mammogram screening discussed. Self breast awareness reviewed. Pap and HRV collected:  {yes no:314532} Guidelines for Calcium , Vitamin D , regular exercise program including cardiovascular and weight bearing exercise. Medication refills:  *** {LABS (Optional):23779} Follow up:  ***    Additional counseling given.  {yes [id number]. ***  total time was spent for this patient encounter, including preparation, face-to-face counseling with the patient, coordination of care, and documentation of the encounter in addition to doing the breast and pelvic exam.

## 2024-09-03 ENCOUNTER — Ambulatory Visit: Payer: Medicare HMO | Admitting: Obstetrics and Gynecology

## 2024-09-03 ENCOUNTER — Encounter: Payer: Self-pay | Admitting: Obstetrics and Gynecology

## 2024-09-03 VITALS — BP 122/72 | HR 67 | Ht 67.0 in | Wt 295.0 lb

## 2024-09-03 DIAGNOSIS — Z9189 Other specified personal risk factors, not elsewhere classified: Secondary | ICD-10-CM | POA: Diagnosis not present

## 2024-09-03 DIAGNOSIS — Z01419 Encounter for gynecological examination (general) (routine) without abnormal findings: Secondary | ICD-10-CM

## 2024-09-03 DIAGNOSIS — B009 Herpesviral infection, unspecified: Secondary | ICD-10-CM | POA: Diagnosis not present

## 2024-09-03 DIAGNOSIS — Z9289 Personal history of other medical treatment: Secondary | ICD-10-CM

## 2024-09-03 NOTE — Patient Instructions (Addendum)
 Calcium in Foods Calcium is a mineral in the body. Of all the minerals in your body, you have the most calcium. Most of the body's calcium supply is stored in bones and teeth. Calcium helps many parts of the body work, including: Blood and blood vessels. Nerves. Hormones. Muscles. Bones and teeth. When your calcium stores are low, you may be at risk for low bone mass, bone loss, and broken bones. When you get enough calcium, it helps to support strong bones and teeth throughout your life. Calcium is especially important for: Children during growth spurts. Females during adolescence. Females who are pregnant or breastfeeding. Females after their menstrual cycle stops (postmenopausal). Females whose menstrual cycle has stopped because of an eating disorder or regular intense exercise. People who can't eat or digest dairy products. People who eat a vegan diet. Recommended daily amounts of calcium: Females (ages 87 to 55): 1,000 mg per day. Females (ages 35 and older): 1,200 mg per day. Males (ages 53 to 45): 1,000 mg per day. Males (ages 43 and older): 1,200 mg per day. Females (ages 88 to 28): 1,300 mg per day. Males (ages 41 to 56): 1,300 mg per day. General information Eat foods that are high in calcium. Try to get most of your calcium from food. Some people may benefit from taking calcium supplements. Check with your health care provider or an expert in healthy eating called a dietitian before starting any calcium supplements. Calcium supplements may interact with certain medicines. Too much calcium may cause other health problems, such as trouble pooping and kidney stones. For the body to absorb calcium, it needs vitamin D. Sources of vitamin D include: Skin exposure to direct sunlight. Foods, such as egg yolks, liver, mushrooms, saltwater fish, and fortified milk. Vitamin D supplements. Check with your provider or dietitian before starting any vitamin D supplements. The amount of  calcium that is absorbed in the body varies with type of food. Talk to a dietitian about what foods are best for you, especially if you are eat a vegan diet or don't eat dairy. What foods are high in calcium?  Foods that are high in calcium contain more than 100 milligrams per serving. Fruits Fortified orange juice or other fruit juice, 300 mg per 8 oz (237 mL) serving. Vegetables Collard greens, 260 mg per 1 cup (130 g) serving, cooked. Kale, 180 mg per 1 cup (118 g) serving, cooked. Bok choy, 180 mg per 1 cup (170 g) serving, cooked Grains Fortified frozen waffles, 200 mg in 2 waffles. Oatmeal, 180 mg in 1 cup (234 g) serving, cooked. Fortified white bread, 175 mg per slice. Meats and other proteins Sardines, canned with bones, 350 mg per 3.75 oz (92 g) serving. Salmon, canned with bones, 168 mg per 3 oz (85 g) serving. Canned shrimp, 125 mg per 3 oz (85 g) serving. Baked beans, 120 mg per 1 cup (266 g) serving. Tofu, firm, made with calcium sulfate, 861 mg per  cup (126 g) serving. Dairy Yogurt, plain, low-fat, 448 mg per 1 cup (245 g) serving Nonfat milk, 300 mg per 1 cup (245 g) serving. American cheese, 145 mg per 1 oz (21 g) serving or 1 slice. Cheddar cheese, 200 mg per 1 oz (28 g) serving or 1 slice. Cottage cheese 2%, 125 mg per  cup (113 g) serving. Fortified soy, rice, or almond milk, 300 mg per 1 cup (237 mL) serving. Mozzarella, part skim, 210 mg per 1 oz (21 g) serving. The items listed  above may not be a complete list of foods high in calcium. Actual amounts of calcium may be different depending on processing. Contact a dietitian for more information. What foods are lower in calcium? Foods that are lower in calcium contain 50 mg or less per serving. Fruits Apple, 1 medium, about 6 mg. Banana, 1 medium, about 12 mg. Vegetables Lettuce, 19 mg per 1 cup (35 g) serving. Tomato, 1 small, about 11 mg. Grains Rice, white, 8 mg per  cup (79 g) serving. Boiled  potatoes, 14 mg per 1 cup (160 g) serving. White bread, 6 mg per slice. Meats and other proteins Egg, 24 mg per 1 egg (50 g). Red meat, 7 mg per 4 oz (80 g) serving. Chicken, 17 mg per 4 oz (113 g) serving. Fish, cod, or trout, 20 mg per 4 oz (140 g) serving. Dairy Cream cheese, regular, 14 mg per 1 Tbsp (15 g) serving. Brie cheese, 50 mg per 1 oz (32 g) serving. The items listed above may not be a complete list of foods lower in calcium. Actual amounts of calcium may be different depending on processing. Contact a dietitian for more information. This information is not intended to replace advice given to you by your health care provider. Make sure you discuss any questions you have with your health care provider. Document Revised: 07/21/2023 Document Reviewed: 07/21/2023 Elsevier Patient Education  2024 [employer]  EXERCISE AND DIET:  We recommended that you start or continue a regular exercise program for good health. Regular exercise means any activity that makes your heart beat faster and makes you sweat.  We recommend exercising at least 30 minutes per day at least 3 days a week, preferably 4 or 5.  We also recommend a diet low in fat and sugar.  Inactivity, poor dietary choices and obesity can cause diabetes, heart attack, stroke, and kidney damage, among others.    ALCOHOL AND SMOKING:  Women should limit their alcohol intake to no more than 7 drinks/beers/glasses of wine (combined, not each!) per week. Moderation of alcohol intake to this level decreases your risk of breast cancer and liver damage. And of course, no recreational drugs are part of a healthy lifestyle.  And absolutely no smoking or even second hand smoke. Most people know smoking can cause heart and lung diseases, but did you know it also contributes to weakening of your bones? Aging of your skin?  Yellowing of your teeth and nails?  CALCIUM AND VITAMIN D:  Adequate intake of calcium and Vitamin D are recommended.  The  recommendations for exact amounts of these supplements seem to change often, but generally speaking 600 mg of calcium (either carbonate or citrate) and 800 units of Vitamin D per day seems prudent. Certain women may benefit from higher intake of Vitamin D.  If you are among these women, your doctor will have told you during your visit.    PAP SMEARS:  Pap smears, to check for cervical cancer or precancers,  have traditionally been done yearly, although recent scientific advances have shown that most women can have pap smears less often.  However, every woman still should have a physical exam from her gynecologist every year. It will include a breast check, inspection of the vulva and vagina to check for abnormal growths or skin changes, a visual exam of the cervix, and then an exam to evaluate the size and shape of the uterus and ovaries.  And after 55 years of age, a rectal exam  is indicated to check for rectal cancers. We will also provide age appropriate advice regarding health maintenance, like when you should have certain vaccines, screening for sexually transmitted diseases, bone density testing, colonoscopy, mammograms, etc.   MAMMOGRAMS:  All women over 11 years old should have a yearly mammogram. Many facilities now offer a "3D" mammogram, which may cost around $50 extra out of pocket. If possible,  we recommend you accept the option to have the 3D mammogram performed.  It both reduces the number of women who will be called back for extra views which then turn out to be normal, and it is better than the routine mammogram at detecting truly abnormal areas.    COLONOSCOPY:  Colonoscopy to screen for colon cancer is recommended for all women at age 8.  We know, you hate the idea of the prep.  We agree, BUT, having colon cancer and not knowing it is worse!!  Colon cancer so often starts as a polyp that can be seen and removed at colonscopy, which can quite literally save your life!  And if your first  colonoscopy is normal and you have no family history of colon cancer, most women don't have to have it again for 10 years.  Once every ten years, you can do something that may end up saving your life, right?  We will be happy to help you get it scheduled when you are ready.  Be sure to check your insurance coverage so you understand how much it will cost.  It may be covered as a preventative service at no cost, but you should check your particular policy.

## 2024-09-23 ENCOUNTER — Ambulatory Visit: Payer: Self-pay | Admitting: Nurse Practitioner

## 2024-09-23 ENCOUNTER — Telehealth: Payer: Self-pay

## 2024-09-23 ENCOUNTER — Telehealth: Payer: Self-pay | Admitting: Nurse Practitioner

## 2024-09-23 ENCOUNTER — Encounter: Payer: Self-pay | Admitting: Nurse Practitioner

## 2024-09-23 VITALS — BP 117/72 | HR 57 | Temp 97.2°F | Ht 65.0 in | Wt 294.0 lb

## 2024-09-23 DIAGNOSIS — R739 Hyperglycemia, unspecified: Secondary | ICD-10-CM

## 2024-09-23 DIAGNOSIS — I10 Essential (primary) hypertension: Secondary | ICD-10-CM | POA: Diagnosis not present

## 2024-09-23 DIAGNOSIS — M545 Low back pain, unspecified: Secondary | ICD-10-CM

## 2024-09-23 DIAGNOSIS — K219 Gastro-esophageal reflux disease without esophagitis: Secondary | ICD-10-CM

## 2024-09-23 DIAGNOSIS — Z23 Encounter for immunization: Secondary | ICD-10-CM | POA: Diagnosis not present

## 2024-09-23 DIAGNOSIS — Z6841 Body Mass Index (BMI) 40.0 and over, adult: Secondary | ICD-10-CM

## 2024-09-23 DIAGNOSIS — E538 Deficiency of other specified B group vitamins: Secondary | ICD-10-CM | POA: Diagnosis not present

## 2024-09-23 DIAGNOSIS — E7800 Pure hypercholesterolemia, unspecified: Secondary | ICD-10-CM

## 2024-09-23 DIAGNOSIS — F411 Generalized anxiety disorder: Secondary | ICD-10-CM

## 2024-09-23 LAB — LIPID PANEL

## 2024-09-23 LAB — BAYER DCA HB A1C WAIVED: HB A1C (BAYER DCA - WAIVED): 6 % — ABNORMAL HIGH (ref 4.8–5.6)

## 2024-09-23 MED ORDER — PREDNISONE 20 MG PO TABS: 40.0000 mg | ORAL_TABLET | Freq: Every day | ORAL | 0 refills | Status: AC

## 2024-09-23 MED ORDER — OMEPRAZOLE 20 MG PO CPDR
20.0000 mg | DELAYED_RELEASE_CAPSULE | Freq: Every day | ORAL | 1 refills | Status: AC
Start: 2024-09-23 — End: ?

## 2024-09-23 MED ORDER — AMLODIPINE BESYLATE 2.5 MG PO TABS: 2.5000 mg | ORAL_TABLET | Freq: Every day | ORAL | 1 refills | Status: AC

## 2024-09-23 MED ORDER — METOPROLOL TARTRATE 25 MG PO TABS: ORAL_TABLET | ORAL | 1 refills | Status: AC

## 2024-09-23 MED ORDER — ESCITALOPRAM OXALATE 10 MG PO TABS: 10.0000 mg | ORAL_TABLET | Freq: Every day | ORAL | 1 refills | Status: AC

## 2024-09-23 MED ORDER — CELECOXIB 200 MG PO CAPS: 200.0000 mg | ORAL_CAPSULE | Freq: Two times a day (BID) | ORAL | 2 refills | Status: AC

## 2024-09-23 MED ORDER — HYDROCHLOROTHIAZIDE 25 MG PO TABS: 25.0000 mg | ORAL_TABLET | Freq: Every day | ORAL | 1 refills | Status: AC

## 2024-09-23 MED ORDER — ROSUVASTATIN CALCIUM 10 MG PO TABS: 10.0000 mg | ORAL_TABLET | Freq: Every day | ORAL | 1 refills | Status: AC

## 2024-09-23 NOTE — Patient Instructions (Signed)
 Back Exercises These exercises help to make your trunk and back strong. They also help to keep the lower back flexible. Doing these exercises can help to prevent or lessen pain in your lower back. If you have back pain, try to do these exercises 2-3 times each day or as told by your doctor. As you get better, do the exercises once each day. Repeat the exercises more often as told by your doctor. To stop back pain from coming back, do the exercises once each day, or as told by your doctor. Do exercises exactly as told by your doctor. Stop right away if you feel sudden pain or your pain gets worse. Exercises Single knee to chest Do these steps 3-5 times in a row for each leg: Lie on your back on a firm bed or the floor with your legs stretched out. Bring one knee to your chest. Grab your knee or thigh with both hands and hold it in place. Pull on your knee until you feel a gentle stretch in your lower back or butt. Keep doing the stretch for 10-30 seconds. Slowly let go of your leg and straighten it. Pelvic tilt Do these steps 5-10 times in a row: Lie on your back on a firm bed or the floor with your legs stretched out. Bend your knees so they point up to the ceiling. Your feet should be flat on the floor. Tighten your lower belly (abdomen) muscles to press your lower back against the floor. This will make your tailbone point up to the ceiling instead of pointing down to your feet or the floor. Stay in this position for 5-10 seconds while you gently tighten your muscles and breathe evenly. Cat-cow Do these steps until your lower back bends more easily: Get on your hands and knees on a firm bed or the floor. Keep your hands under your shoulders, and keep your knees under your hips. You may put padding under your knees. Let your head hang down toward your chest. Tighten (contract) the muscles in your belly. Point your tailbone toward the floor so your lower back becomes rounded like the back of a  cat. Stay in this position for 5 seconds. Slowly lift your head. Let the muscles of your belly relax. Point your tailbone up toward the ceiling so your back forms a sagging arch like the back of a cow. Stay in this position for 5 seconds.  Press-ups Do these steps 5-10 times in a row: Lie on your belly (face-down) on a firm bed or the floor. Place your hands near your head, about shoulder-width apart. While you keep your back relaxed and keep your hips on the floor, slowly straighten your arms to raise the top half of your body and lift your shoulders. Do not use your back muscles. You may change where you place your hands to make yourself more comfortable. Stay in this position for 5 seconds. Keep your back relaxed. Slowly return to lying flat on the floor.  Bridges Do these steps 10 times in a row: Lie on your back on a firm bed or the floor. Bend your knees so they point up to the ceiling. Your feet should be flat on the floor. Your arms should be flat at your sides, next to your body. Tighten your butt muscles and lift your butt off the floor until your waist is almost as high as your knees. If you do not feel the muscles working in your butt and the back of  your thighs, slide your feet 1-2 inches (2.5-5 cm) farther away from your butt. Stay in this position for 3-5 seconds. Slowly lower your butt to the floor, and let your butt muscles relax. If this exercise is too easy, try doing it with your arms crossed over your chest. Belly crunches Do these steps 5-10 times in a row: Lie on your back on a firm bed or the floor with your legs stretched out. Bend your knees so they point up to the ceiling. Your feet should be flat on the floor. Cross your arms over your chest. Tip your chin a little bit toward your chest, but do not bend your neck. Tighten your belly muscles and slowly raise your chest just enough to lift your shoulder blades a tiny bit off the floor. Avoid raising your body  higher than that because it can put too much stress on your lower back. Slowly lower your chest and your head to the floor. Back lifts Do these steps 5-10 times in a row: Lie on your belly (face-down) with your arms at your sides, and rest your forehead on the floor. Tighten the muscles in your legs and your butt. Slowly lift your chest off the floor while you keep your hips on the floor. Keep the back of your head in line with the curve in your back. Look at the floor while you do this. Stay in this position for 3-5 seconds. Slowly lower your chest and your face to the floor. Contact a doctor if: Your back pain gets a lot worse when you do an exercise. Your back pain does not get better within 2 hours after you exercise. If you have any of these problems, stop doing the exercises. Do not do them again unless your doctor says it is okay. Get help right away if: You have sudden, very bad back pain. If this happens, stop doing the exercises. Do not do them again unless your doctor says it is okay. This information is not intended to replace advice given to you by your health care provider. Make sure you discuss any questions you have with your health care provider. Document Revised: 01/05/2021 Document Reviewed: 01/05/2021 Elsevier Patient Education  2024 [employer].

## 2024-09-23 NOTE — Progress Notes (Signed)
 Subjective:    Patient ID: Renee Thomas, female    DOB: February 28, 1971, 53 y.o.   MRN: [id number]   Chief Complaint: medical management of chronic issues     HPI:  Renee Thomas is a 53 y.o. who identifies as a female who was assigned female at birth.   Social history: Lives with: sister Work history: not working   Comes in today for follow up of the following chronic medical issues:  1. Primary hypertension No c/o chest pain, sob or headache. Does  not check blood pressure at home. BP Readings from Last 3 Encounters:  09/03/24 122/72  08/29/24 124/86  03/21/24 116/69     2. GERD without esophagitis Is on omeprazole  and is doing well  3. GAD (generalized anxiety disorder) Is on lexapro  and is doing well    03/21/2024   10:28 AM 01/15/2024   10:46 AM 09/27/2023   10:57 AM 03/12/2023    8:25 AM  GAD 7 : Generalized Anxiety Score  Nervous, Anxious, on Edge 1 1 1 1   Control/stop worrying 1 0 1 1  Worry too much - different things 1 1 1  0  Trouble relaxing 0 1 1 0  Restless 0 1 0 0  Easily annoyed or irritable 0 1 0 0  Afraid - awful might happen 0 1 0 0  Total GAD 7 Score 3 6 4 2   Anxiety Difficulty Not difficult at all Not difficult at all Not difficult at all Not difficult at all      4. Low serum vitamin B12 Gets monthly injections Lab Results  Component Value Date   VITAMINB12 1,087 03/12/2023     5.  Prediabetes Tries to watch diet. Does not check blood sugars at home Lab Results  Component Value Date   HGBA1C 5.8 (H) 03/21/2024      6.BMI 45.0-49.9, adult (HCC) Weight is up 3 lbs  Wt Readings from Last 3 Encounters:  09/03/24 295 lb (133.8 kg)  03/21/24 292 lb (132.5 kg)  02/27/24 293 lb (132.9 kg)   BMI Readings from Last 3 Encounters:  09/03/24 46.20 kg/m  03/21/24 48.59 kg/m  02/27/24 48.76 kg/m       New complaints: Back pain and neck pain- intermittently- worse when she is active. Rates pain 3-8/10. Takes tylenol  which helps  some.  Allergies  Allergen Reactions   Egg Protein-Containing Drug Products Swelling    Swells mouth with itching   Fluarix [Influenza Virus Vaccine] Swelling    Contains egg protein.   Other Anaphylaxis    All Tree-Nuts   Peanut-Containing Drug Products Anaphylaxis   Penicillins Rash and Anaphylaxis    .Has patient had a PCN reaction causing immediate rash, facial/tongue/throat swelling, SOB or lightheadedness with hypotension: Yes Has patient had a PCN reaction causing severe rash involving mucus membranes or skin necrosis: No Has patient had a PCN reaction that required hospitalization: no Has patient had a PCN reaction occurring within the last 10 years: no If all of the above answers are NO, then may proceed with Cephalosporin use.  Other reaction(s): Other (See Comments) .Has patient had a PCN reaction causing immediate rash, facial/tongue/throat swelling, SOB or lightheadedness with hypotension: Yes Has patient had a PCN reaction causing severe rash involving mucus membranes or skin necrosis: No Has patient had a PCN reaction that required hospitalization: no Has patient had a PCN reaction occurring within the last 10 years: no If all of the above answers are NO, then may proceed  with Cephalosporin use.   Apple Itching    Mouth itching   Blueberry Flavoring Agent (Non-Screening) Itching   Kiwi Extract Itching    Per pt tongue and throat itching/ burning   Watermelon Flavoring Agent (Non-Screening) Itching    And Canteloupe   Amoxicillin Rash   Sulfa Antibiotics Rash    numbness   Outpatient Encounter Medications as of 09/23/2024  Medication Sig   acetaminophen  (TYLENOL ) 650 MG CR tablet Take 650 mg by mouth 2 (two) times daily.   amLODipine  (NORVASC ) 2.5 MG tablet Take 1 tablet (2.5 mg total) by mouth daily.   Azelastine  HCl 137 MCG/SPRAY SOLN PLACE 2 SPRAYS INTO BOTH NOSTRILS 2 (TWO) TIMES DAILY. USE IN EACH NOSTRIL AS DIRECTED   Carboxymethylcellul-Glycerin (CLEAR  EYES FOR DRY EYES OP) Place 1-2 drops into both eyes daily as needed (dry eyes).   Cholecalciferol (VITAMIN D3) 50 MCG (2000 UT) TABS Take 1 tablet by mouth daily after lunch.   Cyanocobalamin  (B-12 COMPLIANCE INJECTION IJ) Inject as directed every 30 (thirty) days.   EPINEPHrine  0.3 mg/0.3 mL IJ SOAJ injection Inject 0.3 mg into the muscle as needed.   escitalopram  (LEXAPRO ) 10 MG tablet Take 1 tablet (10 mg total) by mouth daily.   fexofenadine  (ALLEGRA ) 180 MG tablet TAKE 1 TABLET BY MOUTH EVERY DAY   fluticasone  (FLONASE ) 50 MCG/ACT nasal spray Place 2 sprays into both nostrils daily. SPRAY 2 SPRAYS INTO EACH NOSTRIL EVERY DAY   hydrochlorothiazide  (HYDRODIURIL ) 25 MG tablet Take 1 tablet (25 mg total) by mouth daily.   Krill Oil 350 MG CAPS Take 1 capsule by mouth daily.   metoprolol  tartrate (LOPRESSOR ) 25 MG tablet TAKE 1/2 TABLET (12.5 MG TOTAL) BY MOUTH 2 (TWO) TIMES DAILY AS NEEDED (PALPITATIONS).   nystatin  (MYCOSTATIN /NYSTOP ) powder Apply 1 Application topically 3 (three) times daily. Apply to affected area for up to 7 days (Patient taking differently: Apply 1 Application topically 3 (three) times daily as needed. Apply to affected area for up to 7 days)   omeprazole  (PRILOSEC) 20 MG capsule Take 1 capsule (20 mg total) by mouth daily.   Probiotic Product (PROBIOTIC PO) Take 1 capsule by mouth daily.   rosuvastatin  (CRESTOR ) 10 MG tablet Take 1 tablet (10 mg total) by mouth daily.   tacrolimus  (PROTOPIC ) 0.1 % ointment Apply topically 2 (two) times daily.   triamcinolone  ointment (KENALOG ) 0.5 % Apply 1 Application topically 2 (two) times daily. (Patient not taking: Reported on 09/03/2024)   Facility-Administered Encounter Medications as of 09/23/2024  Medication   cyanocobalamin  (VITAMIN B12) injection 1,000 mcg    Past Surgical History:  Procedure Laterality Date   COLONOSCOPY WITH PROPOFOL  N/A 08/24/2023   Procedure: COLONOSCOPY WITH PROPOFOL ;  Surgeon: Rollin Dover, MD;   Location: [facility];  Service: Gastroenterology;  Laterality: N/A;   CYSTOSCOPY N/A 09/07/2023   Procedure: CYSTOSCOPY;  Surgeon: Glennon Almarie POUR, MD;  Location: [facility];  Service: Gynecology;  Laterality: N/A;   DILATATION & CURETTAGE/HYSTEROSCOPY WITH MYOSURE N/A 07/11/2022   Procedure: DILATATION & CURETTAGE/HYSTEROSCOPY WITH MYOSURE RESECTION OF ENDOMETRIAL POLYPS;  Surgeon: Cathlyn JAYSON Nikki Bobie FORBES, MD;  Location: [facility];  Service: Gynecology;  Laterality: N/A;   POLYPECTOMY  08/24/2023   Procedure: POLYPECTOMY;  Surgeon: Rollin Dover, MD;  Location: [facility];  Service: Gastroenterology;;   ROBOTIC ASSISTED TOTAL HYSTERECTOMY WITH BILATERAL SALPINGO OOPHERECTOMY Bilateral 09/07/2023   Procedure: XI ROBOTIC ASSISTED TOTAL HYSTERECTOMY WITH BILATERAL SALPINGO OOPHORECTOMY;  Surgeon: Glennon Almarie POUR, MD;  Location: [facility];  Service: Gynecology;  Laterality: Bilateral;    Family History  Problem Relation Age of Onset   Hypertension Mother    Diabetes Mother    Other Mother        renal disease   Hypertension Father    Heart attack Father    Allergic rhinitis Sister    Hypertension Sister    Allergic rhinitis Sister    Allergic rhinitis Brother    Diabetes Brother    Allergic rhinitis Brother    Diabetes Brother    Allergic rhinitis Brother    Allergic rhinitis Brother    Sleep apnea Neg Hx       Controlled substance contract: n/a     Review of Systems  Constitutional:  Negative for diaphoresis.  Eyes:  Negative for pain.  Respiratory:  Negative for shortness of breath.   Cardiovascular:  Negative for chest pain, palpitations and leg swelling.  Gastrointestinal:  Negative for abdominal pain.  Endocrine: Negative for polydipsia.  Skin:  Negative for rash.  Neurological:  Negative for dizziness, weakness and headaches.  Hematological:  Does not bruise/bleed easily.  All other systems reviewed and  are negative.      Objective:   Physical Exam Vitals and nursing note reviewed.  Constitutional:      General: She is not in acute distress.    Appearance: Normal appearance. She is well-developed.  HENT:     Head: Normocephalic.     Right Ear: Tympanic membrane normal.     Left Ear: Tympanic membrane normal.     Nose: Nose normal.     Mouth/Throat:     Mouth: Mucous membranes are moist.  Eyes:     Pupils: Pupils are equal, round, and reactive to light.  Neck:     Vascular: No carotid bruit or JVD.  Cardiovascular:     Rate and Rhythm: Normal rate and regular rhythm.     Heart sounds: Normal heart sounds.  Pulmonary:     Effort: Pulmonary effort is normal. No respiratory distress.     Breath sounds: Normal breath sounds. No wheezing or rales.  Chest:     Chest wall: No tenderness.  Abdominal:     General: Bowel sounds are normal. There is no distension or abdominal bruit.     Palpations: Abdomen is soft. There is no hepatomegaly, splenomegaly, mass or pulsatile mass.     Tenderness: There is no abdominal tenderness.  Musculoskeletal:        General: Normal range of motion.     Cervical back: Normal range of motion and neck supple.     Comments: Rises slowly from sitting to standing FROM of lumbar spine without pain today (-)SLR bil Motor strength and sensation distally intact  Lymphadenopathy:     Cervical: No cervical adenopathy.  Skin:    General: Skin is warm and dry.  Neurological:     Mental Status: She is alert and oriented to person, place, and time.     Deep Tendon Reflexes: Reflexes are normal and symmetric.  Psychiatric:        Behavior: Behavior normal.        Thought Content: Thought content normal.        Judgment: Judgment normal.     BP 117/72   Pulse (!) 57   Temp (!) 97.2 F (36.2 C) (Skin)   Ht 5' 5 (1.651 m)   Wt 294 lb (133.4 kg)   LMP 12/27/2022 (Approximate)  SpO2 96%   BMI 48.92 kg/m     LMP 12/27/2022 (Approximate)    HGBa1c 6.0%     Assessment & Plan:  Renee Thomas comes in today with chief complaint of Medical Management of Chronic Issues   Diagnosis and orders addressed:  1. Primary hypertension (Primary) Dash diet - CBC with Differential/Platelet - CMP14+EGFR - amLODipine  (NORVASC ) 2.5 MG tablet; Take 1 tablet (2.5 mg total) by mouth daily.  Dispense: 90 tablet; Refill: 1 - hydrochlorothiazide  (HYDRODIURIL ) 25 MG tablet; Take 1 tablet (25 mg total) by mouth daily.  Dispense: 90 tablet; Refill: 1 - metoprolol  tartrate (LOPRESSOR ) 25 MG tablet; TAKE 1/2 TABLET (12.5 MG TOTAL) BY MOUTH 2 (TWO) TIMES DAILY AS NEEDED (PALPITATIONS).  Dispense: 90 tablet; Refill: 1  2. GERD without esophagitis Avoid spicy foods Do not eat 2 hours prior to bedtime - omeprazole  (PRILOSEC) 20 MG capsule; Take 1 capsule (20 mg total) by mouth daily.  Dispense: 90 capsule; Refill: 1  3. GAD (generalized anxiety disorder) Stress management - escitalopram  (LEXAPRO ) 10 MG tablet; Take 1 tablet (10 mg total) by mouth daily.  Dispense: 90 tablet; Refill: 1  4. Low serum vitamin B12 B12 shot today  5. BMI 45.0-49.9, adult (HCC) Discussed diet and exercise for person with BMI >25 Will recheck weight in 3-6 months   6. Elevated blood sugar Continue to watch carbs in diet - Bayer DCA Hb A1c Waived - Lipid panel  7. Pure hypercholesterolemia Low fat diet - rosuvastatin  (CRESTOR ) 10 MG tablet; Take 1 tablet (10 mg total) by mouth daily.  Dispense: 90 tablet; Refill: 1  8. Acute right-sided low back pain without sciatica Moist heat Daily stretching - predniSONE  (DELTASONE ) 20 MG tablet; Take 2 tablets (40 mg total) by mouth daily with breakfast for 5 days. 2 po daily for 5 days  Dispense: 10 tablet; Refill: 0 - celecoxib (CELEBREX) 200 MG capsule; Take 1 capsule (200 mg total) by mouth 2 (two) times daily.  Dispense: 60 capsule; Refill: 2   Labs pending Health Maintenance reviewed Diet and exercise  encouraged  Follow up plan: 6 months   [name] Gladis, FNP

## 2024-09-23 NOTE — Telephone Encounter (Signed)
 Patient was in office today and had follow up appt with PCP. During the visit she received a flu shot. Right after patient left nurse realized that regular flu shot was given instead of egg free that she normally gets. After reviewing allergies in chart, it states that patient is allergic to regular flu shot due to eggs. I contacted the patient by phone  around 10:30am and spoke with patient directly. Advised patient that regular flu shot was given and patient states that she feels fine at the moment. She advised that her throat was a little sratchy after she left the appointment but other than that she wasn't having any problem. She stated that she can eat eggs in cakes and cooked in foods but doesn't eat them individually. Patient stated that she would go ahead and take a benadryl  just in case and I advised patient to contact the office with any further problems or if she began having SOB to call 911. Patient verbalized understanding.   Contacted patient again at 5pm to check on patient and had to leave a voicemail. Again advised to contact the office with any further issues

## 2024-09-23 NOTE — Telephone Encounter (Signed)
 Called and spoke with patient.

## 2024-09-23 NOTE — Telephone Encounter (Unsigned)
 Copied from CRM [id number]. Topic: General - Call Back - No Documentation >> Sep 23, 2024 11:09 AM Kevelyn M wrote: Reason for CRM: Patient is calling back for [facility]  Call back # [phone]

## 2024-09-24 ENCOUNTER — Ambulatory Visit

## 2024-09-24 LAB — LIPID PANEL
Cholesterol, Total: 135 mg/dL (ref 100–199)
HDL: 52 mg/dL (ref >39)
LDL CALC COMMENT:: 2.6 ratio (ref 0.0–4.4)
LDL Chol Calc (NIH): 67 mg/dL (ref 0–99)
Triglycerides: 85 mg/dL (ref 0–149)
VLDL Cholesterol Cal: 16 mg/dL (ref 5–40)

## 2024-09-24 LAB — CMP14+EGFR
ALT: 18 IU/L (ref 0–32)
AST: 15 IU/L (ref 0–40)
Albumin: 4.3 g/dL (ref 3.8–4.9)
Alkaline Phosphatase: 155 IU/L — AB (ref 49–135)
BUN/Creatinine Ratio: 20 (ref 9–23)
BUN: 17 mg/dL (ref 6–24)
Bilirubin Total: 0.6 mg/dL (ref 0.0–1.2)
CO2: 25 mmol/L (ref 20–29)
Calcium: 9.7 mg/dL (ref 8.7–10.2)
Chloride: 100 mmol/L (ref 96–106)
Creatinine, Ser: 0.85 mg/dL (ref 0.57–1.00)
Globulin, Total: 3.3 g/dL (ref 1.5–4.5)
Glucose: 84 mg/dL (ref 70–99)
Potassium: 4.1 mmol/L (ref 3.5–5.2)
Sodium: 139 mmol/L (ref 134–144)
Total Protein: 7.6 g/dL (ref 6.0–8.5)
eGFR: 81 mL/min/1.73 (ref >59)

## 2024-09-24 LAB — CBC WITH DIFFERENTIAL/PLATELET
Basophils Absolute: 0 x10E3/uL (ref 0.0–0.2)
Basos: 1 %
EOS (ABSOLUTE): 0.1 x10E3/uL (ref 0.0–0.4)
Eos: 2 %
Hematocrit: 41.4 % (ref 34.0–46.6)
Hemoglobin: 13.3 g/dL (ref 11.1–15.9)
Immature Grans (Abs): 0 x10E3/uL (ref 0.0–0.1)
Immature Granulocytes: 0 %
Lymphocytes Absolute: 2 x10E3/uL (ref 0.7–3.1)
Lymphs: 41 %
MCH: 29.4 pg (ref 26.6–33.0)
MCHC: 32.1 g/dL (ref 31.5–35.7)
MCV: 92 fL (ref 79–97)
Monocytes Absolute: 0.4 x10E3/uL (ref 0.1–0.9)
Monocytes: 8 %
Neutrophils Absolute: 2.3 x10E3/uL (ref 1.4–7.0)
Neutrophils: 48 %
Platelets: 331 x10E3/uL (ref 150–450)
RBC: 4.52 x10E6/uL (ref 3.77–5.28)
RDW: 14 % (ref 11.7–15.4)
WBC: 4.8 x10E3/uL (ref 3.4–10.8)

## 2024-09-24 NOTE — Telephone Encounter (Unsigned)
 Copied from CRM #[id number]. Topic: General - Other >> Sep 23, 2024  5:34 PM Delon DASEN wrote: Reason for CRM: returning call from office- patient states she is fine

## 2024-09-25 ENCOUNTER — Ambulatory Visit: Payer: Self-pay | Admitting: Nurse Practitioner

## 2024-10-17 LAB — HM MAMMOGRAPHY

## 2024-10-24 ENCOUNTER — Ambulatory Visit

## 2024-10-24 DIAGNOSIS — E538 Deficiency of other specified B group vitamins: Secondary | ICD-10-CM | POA: Diagnosis not present

## 2024-10-24 NOTE — Progress Notes (Signed)
 Patient is in office today for a nurse visit for B12 Injection.  Injection was given in the  Right deltoid. Patient tolerated injection well.

## 2024-10-27 ENCOUNTER — Encounter: Payer: Self-pay | Admitting: Family Medicine

## 2024-10-27 ENCOUNTER — Ambulatory Visit: Payer: Self-pay

## 2024-10-27 ENCOUNTER — Ambulatory Visit (INDEPENDENT_AMBULATORY_CARE_PROVIDER_SITE_OTHER): Admitting: Family Medicine

## 2024-10-27 VITALS — BP 122/74 | HR 65 | Temp 98.0°F | Ht 65.0 in | Wt 297.0 lb

## 2024-10-27 DIAGNOSIS — L723 Sebaceous cyst: Secondary | ICD-10-CM | POA: Diagnosis not present

## 2024-10-27 DIAGNOSIS — L089 Local infection of the skin and subcutaneous tissue, unspecified: Secondary | ICD-10-CM | POA: Diagnosis not present

## 2024-10-27 MED ORDER — DOXYCYCLINE HYCLATE 100 MG PO TABS: 100.0000 mg | ORAL_TABLET | Freq: Two times a day (BID) | ORAL | 0 refills | Status: AC

## 2024-10-27 NOTE — Progress Notes (Signed)
" ° °  Acute Office Visit  Subjective:     Patient ID: Renee Thomas, female    DOB: Jul 04, 1971, 53 y.o.   MRN: [id number]  Chief Complaint  Patient presents with   ruputured cyst    Posterior left neck    HPI  History of Present Illness   Renee Thomas is a 53 year old female who presents with a ruptured cyst on the back of her neck.  Ruptured neck cyst - Ruptured cyst located on the posterior neck, started draining spontaneously on the way here this morning - Onset of symptoms approximately five days ago - Significant pain and tenderness at the site - No associated fever - Cyst began as a small bump and gradually increased in size before rupturing - History of similar cyst in the same location, previously lanced over a year ago (exact date unknown)       ROS As per HPI.      Objective:    BP 122/74   Pulse 65   Temp 98 F (36.7 C)   Ht 5' 5 (1.651 m)   Wt 297 lb (134.7 kg)   LMP 12/27/2022   SpO2 97%   BMI 49.42 kg/m    Physical Exam Vitals and nursing note reviewed.  Constitutional:      General: She is not in acute distress.    Appearance: She is not ill-appearing, toxic-appearing or diaphoretic.  Skin:    General: Skin is warm and dry.     Comments: Open sebaceous cyst to left posterior neck with erythema, warmth, tenderness.   Neurological:     Mental Status: She is alert and oriented to person, place, and time. Mental status is at baseline.  Psychiatric:        Mood and Affect: Mood normal.        Behavior: Behavior normal.       No results found for any visits on 10/27/24.      Assessment & Plan:   Renee Thomas was seen today for ruputured cyst.  Diagnoses and all orders for this visit:  Infected sebaceous cyst Purulent and sebaceous material expressed from ruptured cyst on posterior neck. Thick, clumped contents evacuated with manual pressure and scooping. Wound irrigated with saline. Area dressed with bandages. - Prescribed doxycycline  twice  daily for one week. - Instructed to apply warm compresses three times daily for 20 minutes. - Advised to gently squeeze the cyst with clean hands after warm compresses to aid drainage. - Instructed to return if the cyst worsens or does not resolve. Discussed may need referral to remove capsule if does not resolve.  -     doxycycline  (VIBRA -TABS) 100 MG tablet; Take 1 tablet (100 mg total) by mouth 2 (two) times daily for 7 days.  Return to office for new or worsening symptoms, or if symptoms persist.   The patient indicates understanding of these issues and agrees with the plan.  Renee Thomas Search, FNP   "

## 2024-10-27 NOTE — Telephone Encounter (Signed)
Noted  -LS

## 2024-10-27 NOTE — Telephone Encounter (Signed)
" °  FYI Only or Action Required?: Action required by provider: request for appointment.  Patient was last seen in primary care on 09/23/2024 by Gladis Mustard, FNP.  Called Nurse Triage reporting Recurrent Skin Infections.  Symptoms began several days ago.  Interventions attempted: Rest, hydration, or home remedies.  Symptoms are: gradually worsening.Boil at hairline, back of head. Swollen, painful.  Triage Disposition: See Physician Within 24 Hours  Patient/caregiver understands and will follow disposition?: Yes     Copied from CRM [id number]. Topic: Clinical - Red Word Triage >> Oct 27, 2024  7:48 AM Willma R wrote: Red Word that prompted transfer to Nurse Triage: Patient has a boil in her hairline, extremely painful and ready to burst. Reason for Disposition  [1] Boil > 1/2 inch across (> 12 mm; larger than a marble) AND [2] center is soft or pus colored  Answer Assessment - Initial Assessment Questions 1. APPEARANCE of BOIL: What does the boil look like?      Cyst type, yellow 2. LOCATION: Where is the boil located?      Back of head at hairline 3. NUMBER: How many boils are there?      1 4. SIZE: How big is the boil? (e.g., inches, cm; compare to size of a coin or other object)     nickel 5. ONSET: When did the boil start?     5  days 6. PAIN: Is there any pain? If Yes, ask: How bad is the pain?   (Scale 1-10; or mild, moderate, severe)     8 7. FEVER: Do you have a fever? If Yes, ask: What is it, how was it measured, and when did it start?      OURCE: Have you been around anyone with boils or other Staph infections? Have you ever had boils before?     no 9. OTHER SYMPTOMS: Do you have any other symptoms? (e.g., shaking chills, weakness, rash elsewhere on body)     no 10. PREGNANCY: Is there any chance you are pregnant? When was your last menstrual period?       no  Protocols used: Boil (Skin Abscess)-A-AH  "

## 2024-11-24 ENCOUNTER — Ambulatory Visit (INDEPENDENT_AMBULATORY_CARE_PROVIDER_SITE_OTHER): Admitting: *Deleted

## 2024-11-24 DIAGNOSIS — E538 Deficiency of other specified B group vitamins: Secondary | ICD-10-CM

## 2024-11-24 NOTE — Progress Notes (Signed)
Pt given B12 injection IM left deltoid and tolerated well. °

## 2024-12-26 ENCOUNTER — Ambulatory Visit

## 2025-01-20 ENCOUNTER — Telehealth: Admitting: Family Medicine

## 2025-02-27 ENCOUNTER — Ambulatory Visit: Admitting: Family Medicine

## 2025-03-20 ENCOUNTER — Ambulatory Visit: Admitting: Nurse Practitioner

## 2025-09-09 ENCOUNTER — Encounter: Admitting: Obstetrics and Gynecology
